# Patient Record
Sex: Female | Born: 1960 | Race: White | Hispanic: No | Marital: Married | Smoking: Former smoker
Health system: Southern US, Community
[De-identification: ages and names within clinical notes are randomized; demographics above are authoritative.]

## PROBLEM LIST (undated history)

## (undated) DIAGNOSIS — D649 Anemia, unspecified: Secondary | ICD-10-CM

## (undated) DIAGNOSIS — K760 Fatty (change of) liver, not elsewhere classified: Secondary | ICD-10-CM

## (undated) DIAGNOSIS — N390 Urinary tract infection, site not specified: Secondary | ICD-10-CM

## (undated) DIAGNOSIS — R011 Cardiac murmur, unspecified: Secondary | ICD-10-CM

## (undated) DIAGNOSIS — I2510 Atherosclerotic heart disease of native coronary artery without angina pectoris: Secondary | ICD-10-CM

## (undated) DIAGNOSIS — F329 Major depressive disorder, single episode, unspecified: Secondary | ICD-10-CM

## (undated) DIAGNOSIS — F419 Anxiety disorder, unspecified: Secondary | ICD-10-CM

## (undated) DIAGNOSIS — Q211 Atrial septal defect: Secondary | ICD-10-CM

## (undated) DIAGNOSIS — R110 Nausea: Secondary | ICD-10-CM

## (undated) DIAGNOSIS — G43909 Migraine, unspecified, not intractable, without status migrainosus: Secondary | ICD-10-CM

## (undated) DIAGNOSIS — I209 Angina pectoris, unspecified: Secondary | ICD-10-CM

## (undated) DIAGNOSIS — T148XXA Other injury of unspecified body region, initial encounter: Secondary | ICD-10-CM

## (undated) DIAGNOSIS — M542 Cervicalgia: Secondary | ICD-10-CM

## (undated) DIAGNOSIS — R0600 Dyspnea, unspecified: Secondary | ICD-10-CM

## (undated) DIAGNOSIS — R109 Unspecified abdominal pain: Secondary | ICD-10-CM

## (undated) DIAGNOSIS — Z9889 Other specified postprocedural states: Secondary | ICD-10-CM

## (undated) DIAGNOSIS — G8929 Other chronic pain: Secondary | ICD-10-CM

## (undated) DIAGNOSIS — T8859XA Other complications of anesthesia, initial encounter: Secondary | ICD-10-CM

## (undated) DIAGNOSIS — I639 Cerebral infarction, unspecified: Secondary | ICD-10-CM

## (undated) DIAGNOSIS — F32A Depression, unspecified: Secondary | ICD-10-CM

## (undated) DIAGNOSIS — N83209 Unspecified ovarian cyst, unspecified side: Secondary | ICD-10-CM

## (undated) DIAGNOSIS — J189 Pneumonia, unspecified organism: Secondary | ICD-10-CM

## (undated) DIAGNOSIS — R52 Pain, unspecified: Secondary | ICD-10-CM

## (undated) DIAGNOSIS — Z87442 Personal history of urinary calculi: Secondary | ICD-10-CM

## (undated) DIAGNOSIS — K5900 Constipation, unspecified: Secondary | ICD-10-CM

## (undated) DIAGNOSIS — I10 Essential (primary) hypertension: Secondary | ICD-10-CM

## (undated) DIAGNOSIS — G629 Polyneuropathy, unspecified: Secondary | ICD-10-CM

## (undated) DIAGNOSIS — E785 Hyperlipidemia, unspecified: Secondary | ICD-10-CM

## (undated) DIAGNOSIS — I4510 Unspecified right bundle-branch block: Secondary | ICD-10-CM

## (undated) HISTORY — PX: WISDOM TOOTH EXTRACTION: SHX21

## (undated) HISTORY — PX: NECK SURGERY: SHX720

## (undated) HISTORY — DX: Hyperlipidemia, unspecified: E78.5

---

## 1987-06-01 DIAGNOSIS — Q211 Atrial septal defect, unspecified: Secondary | ICD-10-CM

## 1987-06-01 HISTORY — DX: Atrial septal defect, unspecified: Q21.10

## 1987-06-01 HISTORY — DX: Atrial septal defect: Q21.1

## 1987-06-01 HISTORY — PX: ASD REPAIR: SHX258

## 1991-06-01 HISTORY — PX: ABDOMINAL HYSTERECTOMY: SHX81

## 1997-05-31 HISTORY — PX: CRANIECTOMY SUBOCCIPITAL FOR EXPLORATION / DECOMPRESSION CRANIAL NERVES: SUR326

## 1998-01-24 ENCOUNTER — Inpatient Hospital Stay (HOSPITAL_COMMUNITY): Admission: AD | Admit: 1998-01-24 | Discharge: 1998-02-09 | Payer: Self-pay | Admitting: *Deleted

## 2001-09-06 ENCOUNTER — Encounter: Admission: RE | Admit: 2001-09-06 | Discharge: 2001-09-06 | Payer: Self-pay | Admitting: Infectious Diseases

## 2001-09-24 ENCOUNTER — Emergency Department (HOSPITAL_COMMUNITY): Admission: EM | Admit: 2001-09-24 | Discharge: 2001-09-24 | Payer: Self-pay | Admitting: Emergency Medicine

## 2001-10-06 ENCOUNTER — Emergency Department (HOSPITAL_COMMUNITY): Admission: EM | Admit: 2001-10-06 | Discharge: 2001-10-06 | Payer: Self-pay | Admitting: Emergency Medicine

## 2001-10-13 ENCOUNTER — Other Ambulatory Visit: Admission: RE | Admit: 2001-10-13 | Discharge: 2001-10-13 | Payer: Self-pay | Admitting: General Surgery

## 2002-03-25 ENCOUNTER — Emergency Department (HOSPITAL_COMMUNITY): Admission: EM | Admit: 2002-03-25 | Discharge: 2002-03-25 | Payer: Self-pay | Admitting: Emergency Medicine

## 2002-08-12 ENCOUNTER — Emergency Department (HOSPITAL_COMMUNITY): Admission: EM | Admit: 2002-08-12 | Discharge: 2002-08-12 | Payer: Self-pay | Admitting: Emergency Medicine

## 2002-11-21 DIAGNOSIS — M542 Cervicalgia: Secondary | ICD-10-CM | POA: Insufficient documentation

## 2003-01-27 ENCOUNTER — Emergency Department (HOSPITAL_COMMUNITY): Admission: EM | Admit: 2003-01-27 | Discharge: 2003-01-27 | Payer: Self-pay | Admitting: Emergency Medicine

## 2003-06-01 HISTORY — PX: LEG SURGERY: SHX1003

## 2003-12-07 ENCOUNTER — Emergency Department (HOSPITAL_COMMUNITY): Admission: EM | Admit: 2003-12-07 | Discharge: 2003-12-07 | Payer: Self-pay | Admitting: Emergency Medicine

## 2005-04-23 ENCOUNTER — Ambulatory Visit (HOSPITAL_COMMUNITY): Admission: RE | Admit: 2005-04-23 | Discharge: 2005-04-23 | Payer: Self-pay | Admitting: Family Medicine

## 2005-05-30 ENCOUNTER — Emergency Department (HOSPITAL_COMMUNITY): Admission: EM | Admit: 2005-05-30 | Discharge: 2005-05-30 | Payer: Self-pay | Admitting: Emergency Medicine

## 2005-09-02 ENCOUNTER — Encounter: Admission: RE | Admit: 2005-09-02 | Discharge: 2005-09-02 | Payer: Self-pay | Admitting: Neurological Surgery

## 2005-09-17 ENCOUNTER — Emergency Department (HOSPITAL_COMMUNITY): Admission: EM | Admit: 2005-09-17 | Discharge: 2005-09-17 | Payer: Self-pay | Admitting: Emergency Medicine

## 2005-11-09 ENCOUNTER — Ambulatory Visit (HOSPITAL_COMMUNITY): Admission: RE | Admit: 2005-11-09 | Discharge: 2005-11-09 | Payer: Self-pay | Admitting: Family Medicine

## 2006-03-22 ENCOUNTER — Ambulatory Visit (HOSPITAL_COMMUNITY): Admission: RE | Admit: 2006-03-22 | Discharge: 2006-03-22 | Payer: Self-pay | Admitting: Family Medicine

## 2007-10-11 ENCOUNTER — Ambulatory Visit (HOSPITAL_COMMUNITY): Admission: RE | Admit: 2007-10-11 | Discharge: 2007-10-11 | Payer: Self-pay | Admitting: Internal Medicine

## 2008-01-10 ENCOUNTER — Ambulatory Visit (HOSPITAL_COMMUNITY): Admission: RE | Admit: 2008-01-10 | Discharge: 2008-01-10 | Payer: Self-pay | Admitting: Family Medicine

## 2008-01-16 ENCOUNTER — Ambulatory Visit (HOSPITAL_COMMUNITY): Admission: RE | Admit: 2008-01-16 | Discharge: 2008-01-16 | Payer: Self-pay | Admitting: Family Medicine

## 2008-05-30 ENCOUNTER — Emergency Department (HOSPITAL_COMMUNITY): Admission: EM | Admit: 2008-05-30 | Discharge: 2008-05-30 | Payer: Self-pay | Admitting: Emergency Medicine

## 2008-06-02 ENCOUNTER — Emergency Department (HOSPITAL_COMMUNITY): Admission: EM | Admit: 2008-06-02 | Discharge: 2008-06-02 | Payer: Self-pay | Admitting: Emergency Medicine

## 2008-06-06 ENCOUNTER — Encounter (HOSPITAL_COMMUNITY): Admission: RE | Admit: 2008-06-06 | Discharge: 2008-07-06 | Payer: Self-pay | Admitting: Emergency Medicine

## 2008-06-06 ENCOUNTER — Ambulatory Visit (HOSPITAL_COMMUNITY): Payer: Self-pay | Admitting: Emergency Medicine

## 2009-02-27 ENCOUNTER — Observation Stay (HOSPITAL_COMMUNITY): Admission: EM | Admit: 2009-02-27 | Discharge: 2009-02-28 | Payer: Self-pay | Admitting: Emergency Medicine

## 2010-06-21 ENCOUNTER — Encounter: Payer: Self-pay | Admitting: Family Medicine

## 2010-09-03 LAB — LIPID PANEL
Cholesterol: 98 mg/dL (ref 0–200)
HDL: 33 mg/dL — ABNORMAL LOW (ref >39)
LDL Cholesterol: 51 mg/dL (ref 0–99)

## 2010-09-03 LAB — COMPREHENSIVE METABOLIC PANEL
AST: 17 U/L (ref 0–37)
Albumin: 3.6 g/dL (ref 3.5–5.2)
Alkaline Phosphatase: 59 U/L (ref 39–117)
Calcium: 8.7 mg/dL (ref 8.4–10.5)
Chloride: 106 mEq/L (ref 96–112)
Creatinine, Ser: 0.56 mg/dL (ref 0.4–1.2)
GFR calc non Af Amer: >60 mL/min (ref >60)
Glucose, Bld: 55 mg/dL — ABNORMAL LOW (ref 70–99)
Sodium: 139 mEq/L (ref 135–145)

## 2010-09-03 LAB — CARDIAC PANEL(CRET KIN+CKTOT+MB+TROPI): Total CK: 36 U/L (ref 7–177)

## 2010-09-03 LAB — DIFFERENTIAL
Basophils Absolute: 0 10*3/uL (ref 0.0–0.1)
Basophils Relative: 1 % (ref 0–1)
Eosinophils Relative: 2 % (ref 0–5)
Lymphocytes Relative: 35 % (ref 12–46)
Neutro Abs: 3.2 10*3/uL (ref 1.7–7.7)
Neutrophils Relative %: 52 % (ref 43–77)

## 2010-09-03 LAB — GLUCOSE, CAPILLARY
Glucose-Capillary: 46 mg/dL — ABNORMAL LOW (ref 70–99)
Glucose-Capillary: 78 mg/dL (ref 70–99)
Glucose-Capillary: 83 mg/dL (ref 70–99)

## 2010-09-03 LAB — CBC
HCT: 42.2 % (ref 36.0–46.0)
MCHC: 34.3 g/dL (ref 30.0–36.0)

## 2010-09-04 LAB — BASIC METABOLIC PANEL
BUN: 7 mg/dL (ref 6–23)
CO2: 31 mEq/L (ref 19–32)
GFR calc non Af Amer: >60 mL/min (ref >60)
Potassium: 3.7 mEq/L (ref 3.5–5.1)

## 2010-09-04 LAB — POCT CARDIAC MARKERS
CKMB, poc: 1.1 ng/mL (ref 1.0–8.0)
Myoglobin, poc: 22.4 ng/mL (ref 12–200)
Myoglobin, poc: 32.5 ng/mL (ref 12–200)
Troponin i, poc: <0.05 ng/mL (ref 0.00–0.09)

## 2010-09-04 LAB — DIFFERENTIAL
Eosinophils Absolute: 0.1 10*3/uL (ref 0.0–0.7)
Lymphocytes Relative: 25 % (ref 12–46)
Monocytes Relative: 6 % (ref 3–12)
Neutro Abs: 4.5 10*3/uL (ref 1.7–7.7)

## 2010-09-04 LAB — CBC
HCT: 51 % — ABNORMAL HIGH (ref 36.0–46.0)
Hemoglobin: 17.5 g/dL — ABNORMAL HIGH (ref 12.0–15.0)
MCV: 82.6 fL (ref 78.0–100.0)

## 2011-02-12 ENCOUNTER — Emergency Department (HOSPITAL_COMMUNITY): Payer: Medicare Other

## 2011-02-12 ENCOUNTER — Emergency Department (HOSPITAL_COMMUNITY)
Admission: EM | Admit: 2011-02-12 | Discharge: 2011-02-12 | Discharge status: Against Medical Advice | Payer: Medicare Other | Attending: Emergency Medicine | Admitting: Emergency Medicine

## 2011-02-12 DIAGNOSIS — Z79899 Other long term (current) drug therapy: Secondary | ICD-10-CM | POA: Insufficient documentation

## 2011-02-12 DIAGNOSIS — G8929 Other chronic pain: Secondary | ICD-10-CM | POA: Insufficient documentation

## 2011-02-12 DIAGNOSIS — F329 Major depressive disorder, single episode, unspecified: Secondary | ICD-10-CM | POA: Insufficient documentation

## 2011-02-12 DIAGNOSIS — I10 Essential (primary) hypertension: Secondary | ICD-10-CM | POA: Insufficient documentation

## 2011-02-12 DIAGNOSIS — R0989 Other specified symptoms and signs involving the circulatory and respiratory systems: Secondary | ICD-10-CM | POA: Insufficient documentation

## 2011-02-12 DIAGNOSIS — R1013 Epigastric pain: Secondary | ICD-10-CM | POA: Insufficient documentation

## 2011-02-12 DIAGNOSIS — F3289 Other specified depressive episodes: Secondary | ICD-10-CM | POA: Insufficient documentation

## 2011-02-12 DIAGNOSIS — E119 Type 2 diabetes mellitus without complications: Secondary | ICD-10-CM | POA: Insufficient documentation

## 2011-02-12 DIAGNOSIS — R0609 Other forms of dyspnea: Secondary | ICD-10-CM | POA: Insufficient documentation

## 2011-02-12 DIAGNOSIS — R51 Headache: Secondary | ICD-10-CM | POA: Insufficient documentation

## 2011-02-12 DIAGNOSIS — R0602 Shortness of breath: Secondary | ICD-10-CM | POA: Insufficient documentation

## 2011-02-12 DIAGNOSIS — I2510 Atherosclerotic heart disease of native coronary artery without angina pectoris: Secondary | ICD-10-CM | POA: Insufficient documentation

## 2011-02-12 DIAGNOSIS — R110 Nausea: Secondary | ICD-10-CM | POA: Insufficient documentation

## 2011-02-12 DIAGNOSIS — R079 Chest pain, unspecified: Secondary | ICD-10-CM | POA: Insufficient documentation

## 2011-02-12 LAB — CBC
Hemoglobin: 14 g/dL (ref 12.0–15.0)
MCH: 29.1 pg (ref 26.0–34.0)
MCHC: 34.7 g/dL (ref 30.0–36.0)
Platelets: 204 10*3/uL (ref 150–400)
RBC: 4.81 MIL/uL (ref 3.87–5.11)
RDW: 12.7 % (ref 11.5–15.5)
WBC: 6.7 10*3/uL (ref 4.0–10.5)

## 2011-02-12 LAB — COMPREHENSIVE METABOLIC PANEL
Alkaline Phosphatase: 86 U/L (ref 39–117)
Calcium: 9.3 mg/dL (ref 8.4–10.5)
Creatinine, Ser: 0.68 mg/dL (ref 0.50–1.10)
GFR calc non Af Amer: >60 mL/min (ref >60)
Glucose, Bld: 156 mg/dL — ABNORMAL HIGH (ref 70–99)
Sodium: 137 mEq/L (ref 135–145)

## 2011-02-12 LAB — POCT I-STAT TROPONIN I

## 2011-02-13 ENCOUNTER — Inpatient Hospital Stay (HOSPITAL_COMMUNITY)
Admission: EM | Admit: 2011-02-13 | Discharge: 2011-02-15 | DRG: 287 | Disposition: A | Discharge status: Home / Self Care | Payer: Medicare Other | Attending: Cardiovascular Disease | Admitting: Cardiovascular Disease

## 2011-02-13 DIAGNOSIS — Z880 Allergy status to penicillin: Secondary | ICD-10-CM

## 2011-02-13 DIAGNOSIS — I10 Essential (primary) hypertension: Secondary | ICD-10-CM | POA: Diagnosis present

## 2011-02-13 DIAGNOSIS — Z7982 Long term (current) use of aspirin: Secondary | ICD-10-CM

## 2011-02-13 DIAGNOSIS — F17200 Nicotine dependence, unspecified, uncomplicated: Secondary | ICD-10-CM | POA: Diagnosis present

## 2011-02-13 DIAGNOSIS — F341 Dysthymic disorder: Secondary | ICD-10-CM | POA: Diagnosis present

## 2011-02-13 DIAGNOSIS — R0789 Other chest pain: Principal | ICD-10-CM | POA: Diagnosis present

## 2011-02-13 DIAGNOSIS — G8929 Other chronic pain: Secondary | ICD-10-CM | POA: Diagnosis present

## 2011-02-13 DIAGNOSIS — Z833 Family history of diabetes mellitus: Secondary | ICD-10-CM

## 2011-02-13 DIAGNOSIS — I498 Other specified cardiac arrhythmias: Secondary | ICD-10-CM | POA: Diagnosis present

## 2011-02-13 DIAGNOSIS — Z8614 Personal history of Methicillin resistant Staphylococcus aureus infection: Secondary | ICD-10-CM

## 2011-02-13 DIAGNOSIS — G609 Hereditary and idiopathic neuropathy, unspecified: Secondary | ICD-10-CM | POA: Diagnosis present

## 2011-02-13 DIAGNOSIS — I4510 Unspecified right bundle-branch block: Secondary | ICD-10-CM | POA: Diagnosis present

## 2011-02-13 DIAGNOSIS — G43909 Migraine, unspecified, not intractable, without status migrainosus: Secondary | ICD-10-CM | POA: Diagnosis present

## 2011-02-13 DIAGNOSIS — I2510 Atherosclerotic heart disease of native coronary artery without angina pectoris: Secondary | ICD-10-CM | POA: Diagnosis present

## 2011-02-13 DIAGNOSIS — Z79899 Other long term (current) drug therapy: Secondary | ICD-10-CM

## 2011-02-13 DIAGNOSIS — E119 Type 2 diabetes mellitus without complications: Secondary | ICD-10-CM | POA: Diagnosis present

## 2011-02-13 LAB — DIFFERENTIAL
Basophils Relative: 0 % (ref 0–1)
Eosinophils Absolute: 0.1 10*3/uL (ref 0.0–0.7)
Eosinophils Relative: 1 % (ref 0–5)
Lymphs Abs: 1.7 10*3/uL (ref 0.7–4.0)
Monocytes Absolute: 0.4 10*3/uL (ref 0.1–1.0)
Monocytes Relative: 7 % (ref 3–12)
Neutrophils Relative %: 67 % (ref 43–77)

## 2011-02-13 LAB — URINALYSIS, ROUTINE W REFLEX MICROSCOPIC
Glucose, UA: 250 mg/dL — AB
Ketones, ur: NEGATIVE mg/dL
Leukocytes, UA: NEGATIVE
Nitrite: NEGATIVE
pH: 6 (ref 5.0–8.0)

## 2011-02-13 LAB — COMPREHENSIVE METABOLIC PANEL
Albumin: 4.3 g/dL (ref 3.5–5.2)
Alkaline Phosphatase: 100 U/L (ref 39–117)
BUN: 14 mg/dL (ref 6–23)
Calcium: 10 mg/dL (ref 8.4–10.5)
Creatinine, Ser: 0.66 mg/dL (ref 0.50–1.10)
GFR calc Af Amer: >60 mL/min (ref >60)
Glucose, Bld: 173 mg/dL — ABNORMAL HIGH (ref 70–99)
Total Protein: 8.3 g/dL (ref 6.0–8.3)

## 2011-02-13 LAB — CBC
MCH: 30.1 pg (ref 26.0–34.0)
MCHC: 36 g/dL (ref 30.0–36.0)
MCV: 83.5 fL (ref 78.0–100.0)
Platelets: 244 10*3/uL (ref 150–400)
RBC: 5.52 MIL/uL — ABNORMAL HIGH (ref 3.87–5.11)
RDW: 12.6 % (ref 11.5–15.5)

## 2011-02-13 LAB — D-DIMER, QUANTITATIVE: D-Dimer, Quant: 0.31 ug/mL-FEU (ref 0.00–0.48)

## 2011-02-13 LAB — CK TOTAL AND CKMB (NOT AT ARMC)
CK, MB: 2.2 ng/mL (ref 0.3–4.0)
Total CK: 53 U/L (ref 7–177)

## 2011-02-13 LAB — PRO B NATRIURETIC PEPTIDE: Pro B Natriuretic peptide (BNP): 276.7 pg/mL — ABNORMAL HIGH (ref 0–125)

## 2011-02-13 LAB — POCT I-STAT TROPONIN I: Troponin i, poc: 0.01 ng/mL (ref 0.00–0.08)

## 2011-02-14 HISTORY — PX: CARDIAC CATHETERIZATION: SHX172

## 2011-02-14 LAB — BASIC METABOLIC PANEL
BUN: 15 mg/dL (ref 6–23)
CO2: 30 mEq/L (ref 19–32)
Glucose, Bld: 267 mg/dL — ABNORMAL HIGH (ref 70–99)
Potassium: 4.3 mEq/L (ref 3.5–5.1)
Sodium: 137 mEq/L (ref 135–145)

## 2011-02-14 LAB — GLUCOSE, CAPILLARY
Glucose-Capillary: 255 mg/dL — ABNORMAL HIGH (ref 70–99)
Glucose-Capillary: 181 mg/dL — ABNORMAL HIGH (ref 70–99)

## 2011-02-14 LAB — CBC
HCT: 43.5 % (ref 36.0–46.0)
Hemoglobin: 15.1 g/dL — ABNORMAL HIGH (ref 12.0–15.0)
RBC: 5.18 MIL/uL — ABNORMAL HIGH (ref 3.87–5.11)

## 2011-02-14 LAB — LIPID PANEL
HDL: 42 mg/dL (ref >39)
LDL Cholesterol: 47 mg/dL (ref 0–99)
Total CHOL/HDL Ratio: 2.8 RATIO
Triglycerides: 142 mg/dL (ref <150)
VLDL: 28 mg/dL (ref 0–40)

## 2011-02-14 LAB — HEMOGLOBIN A1C
Hgb A1c MFr Bld: 8.1 % — ABNORMAL HIGH (ref <5.7)
Mean Plasma Glucose: 186 mg/dL — ABNORMAL HIGH (ref <117)

## 2011-02-14 LAB — CARDIAC PANEL(CRET KIN+CKTOT+MB+TROPI)
Relative Index: INVALID (ref 0.0–2.5)
Total CK: 52 U/L (ref 7–177)

## 2011-02-15 HISTORY — PX: CARDIAC CATHETERIZATION: SHX172

## 2011-02-15 LAB — CBC
HCT: 41.1 % (ref 36.0–46.0)
Hemoglobin: 14.1 g/dL (ref 12.0–15.0)
MCV: 83.9 fL (ref 78.0–100.0)
Platelets: 266 10*3/uL (ref 150–400)
RBC: 4.9 MIL/uL (ref 3.87–5.11)
WBC: 7.8 10*3/uL (ref 4.0–10.5)

## 2011-02-15 LAB — BASIC METABOLIC PANEL
Chloride: 100 mEq/L (ref 96–112)
GFR calc non Af Amer: >60 mL/min (ref >60)
Glucose, Bld: 205 mg/dL — ABNORMAL HIGH (ref 70–99)
Potassium: 3.8 mEq/L (ref 3.5–5.1)
Sodium: 136 mEq/L (ref 135–145)

## 2011-02-15 LAB — GLUCOSE, CAPILLARY
Glucose-Capillary: 166 mg/dL — ABNORMAL HIGH (ref 70–99)
Glucose-Capillary: 190 mg/dL — ABNORMAL HIGH (ref 70–99)
Glucose-Capillary: 260 mg/dL — ABNORMAL HIGH (ref 70–99)

## 2011-02-17 NOTE — Discharge Summary (Signed)
Anita [name], Anita [name] NO.:  [id number]  MEDICAL RECORD NO.:  [id number]  LOCATION:  2020                         FACILITY:  [facility]  PHYSICIAN:  Nicki Guadalajara, M.D.     DATE OF BIRTH:  [birth year]  DATE OF ADMISSION:  02/13/2011 DATE OF DISCHARGE:  02/15/2011                              DISCHARGE SUMMARY   DISCHARGE DIAGNOSES: 1. Chest pain, negative myocardial infarction.     a.     Cardiac catheterization with nonobstructive coronary disease      with 30-40% left anterior descending stenosis and 20-30% diagonal,      otherwise patent coronary arteries.  EF 55%.     b.     Sinus tachycardia, episodically.     c.     Atrial septal defect repair/closure. 2. Diabetes mellitus type 2, non-insulin dependent. 3. Accelerated hypertension, poorly controlled. 4. Tobacco abuse. 5. Chronic pain in neck, back, and head. 6. History of migraine. 7. Peripheral neuropathy. 8. Incomplete right bundle-branch block.  DISCHARGE CONDITION:  Improved.  PROCEDURES:  Combined left heart cath on February 15, 2011, by Dr. Nicki Guadalajara.  DISCHARGE MEDICATIONS:  See medication reconciliation sheet, multiple meds were added including Crestor, Protonix, metoprolol 75 mg twice a day and we also added Imdur 30 mg daily.  DISCHARGE INSTRUCTIONS: 1. Follow with Dr. Tresa Endo in 2 weeks the office will call with date and     time. 2. She lightheadedness or dizziness.  He 3. Increase activity slowly.  May shower.  No lifting for 2 days.  No     driving for 2 days. 4. Stop smoking. 5. Heart-healthy diabetic diet. 6. Wash cath site with soap and water.  Call if any bleeding,     swelling, or drainage.  HISTORY OF PRESENT ILLNESS:  A 50 year old female with a history of ASD repair slash closure 23 years ago, history of type 2 diabetes mellitus non-insulin dependent, hypertension as well as chronic neck and back pain presented to the emergency room February 13, 2011, with complaints of  chest discomfort.  She was seen by primary physician on the day before and he felt she should not go to the ER to be seen.  She went to [facility] ER but ER physician wanted to evaluate her first and because Dr. Tresa Endo was not called right away she signed out AMA, but then returned on February 13, 2011, with complaints of chest pain.  She states on the EKG initially she had an irregular heartbeat.  The pain began over approximately 3 days prior to admission under the left breast radiates into her back where she complains of pain.  She also has a burning sensation substernally up to her throat which she tells me mimics her sensation of indigestion.  This was described as a sharp chest pain into her left breast, sublingual nitro completely relieved her pain.  EKG revealed sinus tachycardia with incomplete right bundle-branch block. Cardiac enzymes have been negative throughout hospitalization.  Blood pressure was also elevated on admission 175/85.  She was admitted to [facility] for further evaluation to rule out MI.  She was given IV Lopressor and started on p.o. Lopressor for  accelerated hypertension.  She was also treated with IV hydralazine.  The patient was ruled out for an MI and plans are for cardiac catheterization on February 15, 2011 which she underwent with minimal coronary disease and normal EF.  Dr. Tresa Endo wants better control of her blood pressure.  So medications have been adjusted and as well has followup in the office.  LABORATORY DATA:  Hemoglobin prior to discharge 14.1, hematocrit 41.1, WBC 7.8, platelets 266.  Pro-time 12.9, INR 0.95, D-dimer was 0.31 negative.  Chemistry sodium 136, potassium 3.8, chloride 100, CO2 of 29, glucose 205, BUN 18, creatinine 0.70.  LFTs were normal.  Calcium 9.2, magnesium was 1.7.  Hemoglobin A1c was 8.1.  Cardiac enzymes were negative.  CK ranging 52 to 53, MB 2.7 to 2.2. Troponin I less than 0.30 and BNP was 276.  Total cholesterol 117,  triglycerides 142, HDL 42, LDL 47.  TSH was 2.447.  UA was clear.  A 2-D echo EF was 55%, moderate LVH, grade 1 diastolic dysfunction, mitral regurg, mild.  Also her atrial septum was poorly visualized.  No abnormality by color flow in this patient 24-year status post ASD repair.  RADIOLOGY:  Chest x-ray.  No active cardiopulmonary disease, median sternotomy, no effusion.  EKG initially sinus tachycardia, rate of 99 to120 with incomplete right bundle-branch block, and otherwise no acute changes.  With the beta-blocker, the patient's heart rate has been improved control, blood pressure though still somewhat elevated has improved from admission.  Blood pressure at discharge range 142/78 to 164/86, pulse 92, resp is 18, temperature 97.5, oxygen saturation on room air 99%. The patient is ambulating without problems.  Postcath after her cath orders were completed.  Dr. Tresa Endo felt she was stable for discharge and will follow up as an outpatient.     Darcella Gasman. Annie Paras, N.P.   ______________________________ Nicki Guadalajara, M.D.    LRI/MEDQ  D:  02/15/2011  T:  02/15/2011  Job:  [id number]  Electronically Signed by Nada Boozer N.P. on 02/16/2011 11:19:46 AM Electronically Signed by Nicki Guadalajara M.D. on 02/17/2011 04:56:27 PM

## 2011-02-17 NOTE — Cardiovascular Report (Signed)
NAMEGARNELL, BEGEMAN NO.:  [id number]  MEDICAL RECORD NO.:  [id number]  LOCATION:  2020                         FACILITY:  [facility]  PHYSICIAN:  Nicki Guadalajara, M.D.     DATE OF BIRTH:  July 08, 1960  DATE OF PROCEDURE:  02/15/2011 DATE OF DISCHARGE:                           CARDIAC CATHETERIZATION   PROCEDURE:  Cardiac catheterization.  OPERATOR:  Nicki Guadalajara, MD  INDICATIONS:  Anita [name] is a 50 year old female who has a history of open atrial septal defect repair in November 1988.  She has a history of migraines, status post remote traumatic head injury with chronic headaches and neuropathy.  She has diabetes mellitus and chronic neck and back discomfort.  In addition, there is a history of tobacco use. She recently developed recurrent episodes of chest pain which ultimately led to her admission to [facility].  She was seen with worrisome symptomatology, admitted by Dr. Allyson Sabal, and now scheduled for definitive diagnostic cardiac catheterization.  PROCEDURE IN DETAIL:  After premedication with Versed 2 mg plus fentanyl 50 mcg, the patient was prepped and draped in the usual fashion.  The right femoral artery was punctured anteriorly and a 5-French sheath was inserted without difficulty.  Diagnostic cardiac catheterization was done utilizing 5-French Judkins left and right coronary catheters.  A 5- French pigtail catheter was used for biplane cine left ventriculography as well as distal aortography.  Hemostasis was obtained by direct manual pressure.  The patient tolerated the procedure well.  HEMODYNAMIC DATA:  Central aortic pressure 190/80.  Left ventricular pressure 190/12, post A-wave 24.  ANGIOGRAPHIC DATA:  Left main coronary artery was angiographically normal and bifurcated into an LAD and left circumflex system.  The LAD gave rise to a proximal bifurcating diagonal vessel which had 30% ostial narrowing followed by 20% irregularity.  Just  after this diagonal and first septal perforating artery, the LAD was narrowed to 30- 40%.  The vessel otherwise is free of significant disease and extended to the apex and gave rise to a very large more distal septal perforating branch.  The circumflex vessel was angiographically normal and gave rise to a small high marginal ramus intermediate like vessel, and two additional marginal vessels.  The right coronary artery was angiographically normal and gave rise to moderate-sized PDA system and smaller posterolateral vessel.  Biplane cine left ventriculography revealed preserved global contractility with an ejection fraction of 55%.  There was evidence for concentric left ventricular hypertrophy.  There were no segmental wall motion abnormalities.  Distal aortography was done due to the patient's significant recent hypertension.  There was no evidence for renal artery stenosis.  There was no significant aortoiliac disease.  IMPRESSION: 1. Mild concentric left ventricular hypertrophy with normal left     ventricular systolic function. 2. Mild coronary obstructive disease involving the left anterior     descending, diagonal system with 30% ostial narrowing in the first     diagonal vessel followed by 20% irregularity and 30-40% proximal     left anterior descending stenosis after this diagonal takeoff.  RECOMMENDATIONS:  Medical therapy.  The patient's medications will be adjusted.  She will require more optimal blood  pressure control. Smoking cessation is imperative.  Aggressive lipid management will be undertaken.          ______________________________ Nicki Guadalajara, M.D.     TK/MEDQ  D:  02/15/2011  T:  02/15/2011  Job:  [id number]  Electronically Signed by Nicki Guadalajara M.D. on 02/17/2011 04:56:22 PM

## 2011-02-28 NOTE — H&P (Signed)
[name], [name] NO.:  [id number]  MEDICAL RECORD NO.:  [id number]  LOCATION:  [facility]                         FACILITY:  [facility]  PHYSICIAN:  Landry Corporal, MD DATE OF BIRTH:  1961/03/29  DATE OF ADMISSION:  02/13/2011 DATE OF DISCHARGE:                             HISTORY & PHYSICAL   CHIEF COMPLAINT:  Chest pain.  HISTORY OF PRESENT ILLNESS:  [name] is a 50 year old female, who has a history of ASD closure/repair 23 years ago, type 2 diabetes mellitus, and hypertension as well as chronic neck and back pain, who presents to the emergency department with complaints of chest discomfort.  She was seen by her primary care physician yesterday and was told, based on her EKG that she needed to be seen and evaluated here at [facility].  She tells me that her heart rate was irregular at that time.  She presented to [facility] for evaluation and insisted that Dr. Tresa Endo be called.  The ER physician wanted to proceed with his initial evaluation, and because Dr. Tresa Endo was not called right away, she signed out AMA.  She returns this evening with complaints of chest discomfort.  She complains of pain which started about 3 days ago.  There is an area just under her left breast which radiates into her back where she complains of pain.  When that discomfort starts, she also begins to experience a burning sensation substernally up to her throat which she tells me mimics her sensation of indigestion.  She says at times the pain under her left breast is very sharp.  She has been treated with sublingual nitroglycerin with complete relief on arrival to the emergency department.  Her EKG reveals sinus tachycardia with an incomplete right bundle-branch block.  Her initial point-of-care troponin was negative. On arrival, her blood pressure was 175/87.  She has been treated with aspirin as well as morphine.  Her heart rate remains elevated around 100 and her blood pressure is  elevated as well at 185/90. I have also ordered IV Lopressor for her at this time as well.  She has a negative D- dimer.  At this time, she denies any orthopnea or PND.  No lower extremity edema.  She denies any sensation of tachycardia or palpitations.  No lightheadedness, dizziness.  No syncope or presyncope. She denies any fever or chills.  No melena or hematochezia.  She does have chronic pain in her head, neck, and back and is on multiple pain medications for that; however, she did not bring a list of her medicines with her nor can she recall the names of her medicines.  Currently, she has a very minimal pain.  PAST MEDICAL HISTORY: 1. ASD repair/closure. 2. Diabetes mellitus type 2. 3. Chronic neck and back pain. 4. History of migraines. 5. History of head injury. 6. History of MRSA. 7. History of neuropathy. 8. Hypertension. 9. Unknown lipid status. 10.Current tobacco use. 11.Chronic nausea.  Family history is positive for diabetes mellitus.  SOCIAL HISTORY:  She is married, has been married 1 year.  She does not work.  She is disabled.  She smokes 2-3 cigarettes a day.  Denies  any alcohol use.  No illicit drug use.  ALLERGIES:  PENICILLIN.  CURRENT MEDICATIONS:  The patient currently is unsure of her medications and dosages.  REVIEW OF SYSTEMS:  As per HPI, otherwise, negative.  PHYSICAL EXAMINATION:  VITAL SIGNS:  Blood pressure is 174/80, pulse is 92 and regular, respirations are 18, pulse ox is 97%. GENERAL:  This is a 50 year old white female, in no acute distress. HEENT:  Normocephalic, atraumatic.  Pupils are equal and reactive to light and accommodation. NECK:  Supple.  No JVD or carotid bruits. CARDIOVASCULAR:  Regular rate and rhythm.  S1 and S2 without appreciable murmur, gallop, or rub.  There is tenderness to palpation of the left chest just below her breast. LUNGS:  Clear to auscultation bilaterally with normal respiratory effort. ABDOMEN:  Soft,  nontender.  Bowel sounds are present x4.  No hepatosplenomegaly or masses. EXTREMITIES:  Radial, femoral, dorsal pedal arteries are present.  No lower extremity edema.  No clubbing, cyanosis, or ulcers. NEUROLOGIC:  Oriented to person, place, time.  Normal mood and affect. Motor strength is 5/5 bilaterally.  Sensation is intact. SKIN:  Warm and dry.  LABORATORY DATA:  D-dimer is 0.31.  Urinalysis is negative with the exception of glucose in the urine of 250.  Sodium is 135, potassium is 4.0, glucose is 173, BUN is 14, creatinine 0.66, troponin is 0.01.  CBC, hemoglobin is 16.6, hematocrit 46.1, white cell count is 6.7.  Chest x-ray is negative.  IMPRESSION: 1. Chest pain, likely musculoskeletal. 2. History of atrial septal defect closure. 3. Diabetes mellitus type 2. 4. Hypertension. 5. Unknown lipid status. 6. Tobacco abuse. 7. Chronic pain in the neck, back, and head. 8. History of migraine. 9. History of MRSA infection. 10.Peripheral neuropathy.  PLAN:  We will admit to telemetry.  We will rule her out for myocardial infarction.  We will continue cycling her enzymes.  We will go ahead and treat her now with IV Lopressor 5 mg and we will begin her on p.o. Lopressor.  We will continue with her ramipril and also I have ordered hydralazine IV for persistent blood pressure greater than 160 systolic. We will try to obtain the correct dosages and medications for her and order those accordingly.  We will place an inch of nitroglycerin paste q.6 h. and begin Lovenox as well as aspirin.  We will obtain an echocardiogram to assess for any structural abnormalities given she is having chest pain and has had a history of ASD closure.  Further recommendations pending the above studies.    ______________________________ [employer], NP   ______________________________ Landry Corporal, MD    LS/MEDQ  D:  02/13/2011  T:  02/13/2011  Job:  [id number]  cc:   Madelin Rear. Sherwood Gambler,  MD  Electronically Signed by Charmian Muff NP on 02/28/2011 01:01:23 AM Electronically Signed by Bryan Lemma MD on 02/28/2011 10:18:56 AM

## 2011-06-14 DIAGNOSIS — M5030 Other cervical disc degeneration, unspecified cervical region: Secondary | ICD-10-CM | POA: Diagnosis not present

## 2011-06-14 DIAGNOSIS — M531 Cervicobrachial syndrome: Secondary | ICD-10-CM | POA: Diagnosis not present

## 2011-06-14 DIAGNOSIS — G894 Chronic pain syndrome: Secondary | ICD-10-CM | POA: Diagnosis not present

## 2011-06-21 DIAGNOSIS — E119 Type 2 diabetes mellitus without complications: Secondary | ICD-10-CM | POA: Diagnosis not present

## 2011-06-21 DIAGNOSIS — R002 Palpitations: Secondary | ICD-10-CM | POA: Diagnosis not present

## 2011-06-21 DIAGNOSIS — K219 Gastro-esophageal reflux disease without esophagitis: Secondary | ICD-10-CM | POA: Diagnosis not present

## 2011-06-21 DIAGNOSIS — I119 Hypertensive heart disease without heart failure: Secondary | ICD-10-CM | POA: Diagnosis not present

## 2011-07-13 DIAGNOSIS — M531 Cervicobrachial syndrome: Secondary | ICD-10-CM | POA: Diagnosis not present

## 2011-07-13 DIAGNOSIS — G894 Chronic pain syndrome: Secondary | ICD-10-CM | POA: Diagnosis not present

## 2011-07-13 DIAGNOSIS — M5030 Other cervical disc degeneration, unspecified cervical region: Secondary | ICD-10-CM | POA: Diagnosis not present

## 2011-08-11 DIAGNOSIS — G894 Chronic pain syndrome: Secondary | ICD-10-CM | POA: Diagnosis not present

## 2011-08-11 DIAGNOSIS — M47812 Spondylosis without myelopathy or radiculopathy, cervical region: Secondary | ICD-10-CM | POA: Diagnosis not present

## 2011-08-11 DIAGNOSIS — M531 Cervicobrachial syndrome: Secondary | ICD-10-CM | POA: Diagnosis not present

## 2011-08-14 ENCOUNTER — Other Ambulatory Visit: Payer: Self-pay

## 2011-08-14 ENCOUNTER — Emergency Department (HOSPITAL_COMMUNITY): Payer: Medicare Other

## 2011-08-14 ENCOUNTER — Encounter (HOSPITAL_COMMUNITY): Payer: Self-pay | Admitting: *Deleted

## 2011-08-14 ENCOUNTER — Emergency Department (HOSPITAL_COMMUNITY)
Admission: EM | Admit: 2011-08-14 | Discharge: 2011-08-14 | Discharge status: Against Medical Advice | Payer: Medicare Other | Attending: Emergency Medicine | Admitting: Emergency Medicine

## 2011-08-14 DIAGNOSIS — R079 Chest pain, unspecified: Secondary | ICD-10-CM | POA: Diagnosis not present

## 2011-08-14 DIAGNOSIS — R109 Unspecified abdominal pain: Secondary | ICD-10-CM | POA: Diagnosis not present

## 2011-08-14 DIAGNOSIS — L02219 Cutaneous abscess of trunk, unspecified: Secondary | ICD-10-CM | POA: Insufficient documentation

## 2011-08-14 DIAGNOSIS — K5900 Constipation, unspecified: Secondary | ICD-10-CM | POA: Insufficient documentation

## 2011-08-14 DIAGNOSIS — L03319 Cellulitis of trunk, unspecified: Secondary | ICD-10-CM | POA: Diagnosis not present

## 2011-08-14 DIAGNOSIS — G8929 Other chronic pain: Secondary | ICD-10-CM | POA: Diagnosis not present

## 2011-08-14 DIAGNOSIS — I517 Cardiomegaly: Secondary | ICD-10-CM | POA: Diagnosis not present

## 2011-08-14 DIAGNOSIS — R112 Nausea with vomiting, unspecified: Secondary | ICD-10-CM | POA: Diagnosis not present

## 2011-08-14 DIAGNOSIS — L02211 Cutaneous abscess of abdominal wall: Secondary | ICD-10-CM

## 2011-08-14 HISTORY — DX: Nausea: R11.0

## 2011-08-14 HISTORY — DX: Essential (primary) hypertension: I10

## 2011-08-14 HISTORY — DX: Unspecified abdominal pain: R10.9

## 2011-08-14 HISTORY — DX: Polyneuropathy, unspecified: G62.9

## 2011-08-14 HISTORY — DX: Cervicalgia: M54.2

## 2011-08-14 HISTORY — DX: Pain, unspecified: R52

## 2011-08-14 HISTORY — DX: Migraine, unspecified, not intractable, without status migrainosus: G43.909

## 2011-08-14 HISTORY — DX: Other injury of unspecified body region, initial encounter: T14.8XXA

## 2011-08-14 HISTORY — DX: Other chronic pain: G89.29

## 2011-08-14 HISTORY — DX: Unspecified right bundle-branch block: I45.10

## 2011-08-14 HISTORY — DX: Other specified postprocedural states: Z98.890

## 2011-08-14 LAB — DIFFERENTIAL
Basophils Absolute: 0 10*3/uL (ref 0.0–0.1)
Basophils Relative: 0 % (ref 0–1)
Eosinophils Absolute: 0.1 10*3/uL (ref 0.0–0.7)
Eosinophils Relative: 2 % (ref 0–5)
Lymphocytes Relative: 27 % (ref 12–46)
Monocytes Absolute: 0.6 10*3/uL (ref 0.1–1.0)

## 2011-08-14 LAB — COMPREHENSIVE METABOLIC PANEL
ALT: 42 U/L — ABNORMAL HIGH (ref 0–35)
AST: 31 U/L (ref 0–37)
Albumin: 3.7 g/dL (ref 3.5–5.2)
Calcium: 9.4 mg/dL (ref 8.4–10.5)
Creatinine, Ser: 0.56 mg/dL (ref 0.50–1.10)
Sodium: 134 mEq/L — ABNORMAL LOW (ref 135–145)
Total Protein: 7.2 g/dL (ref 6.0–8.3)

## 2011-08-14 LAB — LACTIC ACID, PLASMA: Lactic Acid, Venous: 1.7 mmol/L (ref 0.5–2.2)

## 2011-08-14 LAB — URINALYSIS, ROUTINE W REFLEX MICROSCOPIC
Bilirubin Urine: NEGATIVE
Glucose, UA: >1000 mg/dL — AB
Protein, ur: NEGATIVE mg/dL
Specific Gravity, Urine: 1.02 (ref 1.005–1.030)
Urobilinogen, UA: 0.2 mg/dL (ref 0.0–1.0)

## 2011-08-14 LAB — CBC
MCH: 27.9 pg (ref 26.0–34.0)
MCHC: 33.8 g/dL (ref 30.0–36.0)
MCV: 82.6 fL (ref 78.0–100.0)
Platelets: 204 10*3/uL (ref 150–400)
RDW: 12.6 % (ref 11.5–15.5)
WBC: 6.6 10*3/uL (ref 4.0–10.5)

## 2011-08-14 LAB — TROPONIN I: Troponin I: <0.3 ng/mL (ref <0.30)

## 2011-08-14 LAB — PROCALCITONIN: Procalcitonin: <0.1 ng/mL

## 2011-08-14 LAB — URINE MICROSCOPIC-ADD ON

## 2011-08-14 MED ORDER — SODIUM CHLORIDE 0.9 % IV SOLN: INTRAVENOUS | Status: DC

## 2011-08-14 MED ORDER — HYDROMORPHONE HCL PF 1 MG/ML IJ SOLN
1.0000 mg | INTRAMUSCULAR | Status: DC | PRN
  Filled 2011-08-14: qty 1

## 2011-08-14 MED ORDER — ONDANSETRON 8 MG PO TBDP
ORAL_TABLET | ORAL | Status: AC
  Filled 2011-08-14: qty 1

## 2011-08-14 MED ORDER — ONDANSETRON HCL 4 MG/2ML IJ SOLN: 4.0000 mg | INTRAMUSCULAR | Status: DC | PRN

## 2011-08-14 MED ORDER — MINOCYCLINE HCL 100 MG PO CAPS: 100.0000 mg | ORAL_CAPSULE | Freq: Two times a day (BID) | ORAL | Status: AC

## 2011-08-14 NOTE — ED Notes (Signed)
Substernal CP x 1 week, described as pressure.

## 2011-08-14 NOTE — ED Notes (Signed)
Pt states, "Pain doctor said not to take morphine any more (don't do any good and makes me sick). But dilaudid works."

## 2011-08-14 NOTE — ED Provider Notes (Signed)
History     CSN: [id number]  Arrival date & time 08/14/11  6213   First MD Initiated Contact with Patient 08/14/11 1846      Chief Complaint  Patient presents with  . Chest Pain  . Abscess  . Pain  . Nausea    HPI Pt was seen at 1900.  Per pt, c/o gradual onset and persistence of constant multiple complaints for the past 1 week.  States her pain doctor has told her to come to the ED for these complaints and to "get some dilaudid because it's the only thing that will work."  Pt c/o constant mid-sternal chest "pain" for the past week, worse over the past 3 days.  Describes the pain as "burning" and "constant."  Also c/o multiple intermittent episodes of N/V for the past 3 days, which has "worsened" her chest pain.  Also c/o acute flair of her chronic neck and back pain.  Also c/o abscess to her right lower abd wall area that "my pain doctor said needs to be opened and cultured."  Denies any change in her usual longstanding chronic pain syndromes.  Denies diarrhea, no black or blood in stools or emesis, no SOB/cough, no palpitations, no radiation of her CP, no abd pain, no flank pain, no dysuria, no fevers.     Past Medical History  Diagnosis Date  . Nerve damage     to neck.  . Pain management   . Hypertension   . Diabetes mellitus   . Peripheral neuropathy   . Chronic back pain   . Chronic neck pain   . Migraine headache   . Chronic nausea   . Incomplete RBBB   . Chronic abdominal pain   . History of cardiac catheterization 02/15/11 Dr. 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 Leg surgery   . Asd repair   . Cardiac catheterization   . Neck surgery   . Abdominal hysterectomy     History  Substance  Use Topics  . Smoking status: Current Everyday Smoker  . Smokeless tobacco: Not on file  . Alcohol Use: No    Review of Systems ROS: Statement: All systems negative except as marked or noted in the HPI; Constitutional: Negative for fever and chills. ; ; Eyes: Negative for eye pain, redness and discharge. ; ; ENMT: Negative for ear pain, hoarseness, nasal congestion, sinus pressure and sore throat. ; ; Cardiovascular: +CP.  Negative for palpitations, diaphoresis, dyspnea and peripheral edema. ; ; Respiratory: Negative for cough, wheezing and stridor. ; ; Gastrointestinal: +N/V.  Negative for diarrhea, abdominal pain, blood in stool, hematemesis, jaundice and rectal bleeding. . ; ; Genitourinary: Negative for dysuria, flank pain and hematuria. ; ; Musculoskeletal: +back pain and neck pain. Negative for swelling and trauma.; ; Skin: Negative for pruritus, rash, abrasions, blisters, bruising and +skin lesion.; ; Neuro: Negative for headache, lightheadedness and neck stiffness. Negative for weakness, altered level of consciousness , altered mental status, extremity weakness, paresthesias, involuntary movement, seizure and syncope.      Allergies  Morphine and related; Other; and Penicillins  Home Medications  No current outpatient prescriptions on file.  BP 177/64  Pulse 80  Temp(Src) 98.7 F (37.1 C) (Oral)  Resp 16  Ht 5\' 5"  (  1.651 m)  Wt 165 lb (74.844 kg)  BMI 27.46 kg/m2  SpO2 96%  Physical Exam 1905: Physical examination:  Nursing notes reviewed; Vital signs and O2 SAT reviewed;  Constitutional: Well developed, Well nourished, Well hydrated, In no acute distress; Head:  Normocephalic, atraumatic; Eyes: EOMI, PERRL, No scleral icterus; ENMT: Mouth and pharynx normal, Mucous membranes moist; Neck: Supple, Full range of motion, No lymphadenopathy; Cardiovascular: Regular rate and rhythm, No murmur, rub, or gallop; Respiratory: Breath sounds clear & equal bilaterally, No rales, rhonchi,  wheezes, or rub, Normal respiratory effort/excursion; Chest: Nontender, Movement normal; Abdomen: Soft, Nontender, Nondistended, Normal bowel sounds; Extremities: Pulses normal, No tenderness, No edema, No calf edema or asymmetry.; Neuro: AA&Ox3, Major CN grossly intact.  No gross focal motor or sensory deficits in extremities.; Skin: Color normal, Warm, Dry, +approx 2cm diameter fluctuant abscess to right lower abd wall area with central pointing area and surrounding erythema, no drainage, no streaking, no soft tissue crepitus.    ED Course  INCISION AND DRAINAGE Date/Time: 08/14/2011 8:20 PM Performed by: Kathie Dike Authorized by: Kathie Dike Consent: Verbal consent obtained. Risks and benefits: risks, benefits and alternatives were discussed Consent given by: patient Patient understanding: patient states understanding of the procedure being performed Patient identity confirmed: verbally with patient Time out: Immediately prior to procedure a "time out" was called to verify the correct patient, procedure, equipment, support staff and site/side marked as required. Type: abscess Body area: trunk Location details: abdomen Anesthesia: local infiltration Local anesthetic: bupivacaine 0.5% without epinephrine Scalpel size: 11 Incision type: single straight Complexity: complex Drainage: purulent Drainage amount: moderate Packing material: 1/4 in iodoform gauze Patient tolerance: Patient tolerated the procedure well with no immediate complications. Comments: Culture sent to the lab. Sterile dressing by me.     MDM  MDM Reviewed: nursing note, previous chart and vitals Reviewed previous: ECG Interpretation: ECG, labs and x-ray    Date: 08/14/2011  Rate: 85  Rhythm: normal sinus rhythm  QRS Axis: normal  Intervals: normal  ST/T Wave abnormalities: normal  Conduction Disutrbances:nonspecific intraventricular conduction delay  Narrative Interpretation:   Old EKG Reviewed:  unchanged; no significant changes from previous EKG dated 02/15/2011.   Results for orders placed during the hospital encounter of 08/14/11  CBC      Component Value Range   WBC 6.6  4.0 - 10.5 (K/uL)   RBC 4.76  3.87 - 5.11 (MIL/uL)   Hemoglobin 13.3  12.0 - 15.0 (g/dL)   HCT 16.1  09.6 - 04.5 (%)   MCV 82.6  78.0 - 100.0 (fL)   MCH 27.9  26.0 - 34.0 (pg)   MCHC 33.8  30.0 - 36.0 (g/dL)   RDW 40.9  81.1 - 91.4 (%)   Platelets 204  150 - 400 (K/uL)  DIFFERENTIAL      Component Value Range   Neutrophils Relative 62  43 - 77 (%)   Neutro Abs 4.1  1.7 - 7.7 (K/uL)   Lymphocytes Relative 27  12 - 46 (%)   Lymphs Abs 1.8  0.7 - 4.0 (K/uL)   Monocytes Relative 9  3 - 12 (%)   Monocytes Absolute 0.6  0.1 - 1.0 (K/uL)   Eosinophils Relative 2  0 - 5 (%)   Eosinophils Absolute 0.1  0.0 - 0.7 (K/uL)   Basophils Relative 0  0 - 1 (%)   Basophils Absolute 0.0  0.0 - 0.1 (K/uL)  COMPREHENSIVE METABOLIC PANEL      Component Value  Range   Sodium 134 (*) 135 - 145 (mEq/L)   Potassium 3.9  3.5 - 5.1 (mEq/L)   Chloride 97  96 - 112 (mEq/L)   CO2 29  19 - 32 (mEq/L)   Glucose, Bld 325 (*) 70 - 99 (mg/dL)   BUN 10  6 - 23 (mg/dL)   Creatinine, Ser 8.29  0.50 - 1.10 (mg/dL)   Calcium 9.4  8.4 - 56.2 (mg/dL)   Total Protein 7.2  6.0 - 8.3 (g/dL)   Albumin 3.7  3.5 - 5.2 (g/dL)   AST 31  0 - 37 (U/L)   ALT 42 (*) 0 - 35 (U/L)   Alkaline Phosphatase 100  39 - 117 (U/L)   Total Bilirubin 0.2 (*) 0.3 - 1.2 (mg/dL)   GFR calc non Af Amer >90  >90 (mL/min)   GFR calc Af Amer >90  >90 (mL/min)  LIPASE, BLOOD      Component Value Range   Lipase 59  11 - 59 (U/L)  TROPONIN I      Component Value Range   Troponin I <0.30  <0.30 (ng/mL)  LACTIC ACID, PLASMA      Component Value Range   Lactic Acid, Venous 1.7  0.5 - 2.2 (mmol/L)  PROCALCITONIN      Component Value Range   Procalcitonin <0.10      Dg Abd Acute W/chest 08/14/2011  *RADIOLOGY REPORT*  Clinical Data: Chest pain and abdominal  pain.  ACUTE ABDOMEN SERIES (ABDOMEN 2 VIEW & CHEST 1 VIEW)  Comparison: Chest x-ray 02/12/2011.  Findings: Cardiomegaly is stable.  The lung bases are clear.  Moderate stool is present throughout the colon.  There is no evidence for obstruction or free air.  The axial skeleton is unremarkable.  IMPRESSION:  1.  Stable cardiomegaly without failure. 2.  Moderate stool burden throughout the colon.  Original Report Authenticated By: Jamesetta Orleans. MATTERN, M.D.     9:23 PM:  Pt states her pain is completely relieved after dilaudid and zofran and she feels "much better now."  Abscess I&D'd by PA Beverely Pace, culture pending.  Pt agreeable to stay for 2nd EKG and troponin check at 2330 (4 hour delta).   Doubt ACS as cause for her CP today, as pt's symptoms have been constant for the past 1 week, EKG is unchanged from previous, and troponin is negative.  Known hx of DM, not acidotic today.  UA contaminated.   2200:  Pt states she is "better now" and wants to go home to f/u with her Cards MD on Monday.  Pt has not had N/V while in the ED, has tol PO well.  Pt and family informed re: dx testing results, and that I recommend a 2nd EKG and troponin level for further evaluation.  Pt refuses to stay.  I encouraged pt to stay, continues to refuse.  Pt makes her own medical decisions.  Risks of AMA explained to pt and family, including, but not limited to:  stroke, heart attack, cardiac arrythmia ("irregular heart rate/beat"), "passing out," temporary and/or permanent disability, death.  Pt and family verb understanding and continue to refuse to stay, understanding the consequences of their decision.  I encouraged pt to follow up with her PMD on Monday and return to the ED immediately if symptoms return, or for any other concerns.  Pt and family verb understanding, agreeable.       Only the procedure above (I&D of abscess) was performed by midlevel.  I personally evaluated the  patient during the encounter.     Anita [name] Allison Quarry, DO 08/15/11 1536

## 2011-08-14 NOTE — Discharge Instructions (Signed)
RESOURCE GUIDE  Dental Problems  Patients with Medicaid: [facility]                     [facility] [address]                                           [address] Phone:  [phone]                                                  Phone:  [phone]  If unable to pay or uninsured, contact:  [facility] or [facility] to become qualified for the adult dental clinic.  Chronic Pain Problems Contact [facility]  [phone] Patients need to be referred by their primary care doctor.  Insufficient Money for Medicine Contact [employer]:  call "211" or [facility] 271-5999.  No Primary Care Doctor Call [facility]  [phone] Other agencies that provide inexpensive medical care    [facility]  [phone]    [facility]  [phone]    [facility]  [phone]    Women's Clinic  [phone]    [facility]  [phone]    [facility]  [phone]  Psychological Services [facility]  [phone] [facility]  [phone] [facility]   [phone] (emergency services [phone])  Substance Abuse Resources [facility]  336-882-2125 [facility] 336-784-9470 [facility] 336-285-9073 [facility] 336-845-3988 Residential & Outpatient Substance Abuse Program  800-659-3381  Abuse/Neglect [place] Child Abuse Hotline (336) 641-3795 [place] Child Abuse Hotline 800-378-5315 (After Hours)  Emergency Shelter [facility] (336) 271-5985  Maternity Homes [facility] (336) 275-9566 [facility] (704) 372-4663  MRSA Hotline #:   832-7006    [place] Resources  [facility] [place]     [employer]                          [facility] [address] [place]                       [address]      [address]  [place]                                                [place]                            [place] Phone:  [phone]                                   Phone:  [phone]                 Phone:  [phone]  [facility] Phone:  [phone]    [facility] Child Abuse Hotline 239 221 6345 (662)667-8732 (After Hours)   Take the new prescription as directed.  It is ok to shower and/or sit in a warm tub of water, this may help ease some of your discomfort and encourage the area to drain.  Take over the counter laxative (such as miralax, milk of magnesia, senokot) and repeat tomorrow.  Begin to take over the counter stool softener (colace), as directed on packaging, for the next month.  Continue to take your usual prescriptions as previously directed.  Call your regular medical doctor on Monday to schedule a follow up appointment within the next 48 hours to have your packing removed.  Also call your Cardiologist and your Pain Management doctor on Monday to schedule a follow up appointment within the next 3 to 4 days  Return to the Emergency Department immediately sooner if worsening.

## 2011-08-16 LAB — URINE CULTURE: Colony Count: 7000

## 2011-08-20 DIAGNOSIS — L0291 Cutaneous abscess, unspecified: Secondary | ICD-10-CM | POA: Diagnosis not present

## 2011-08-23 DIAGNOSIS — Z6832 Body mass index (BMI) 32.0-32.9, adult: Secondary | ICD-10-CM | POA: Diagnosis not present

## 2011-08-23 DIAGNOSIS — L0291 Cutaneous abscess, unspecified: Secondary | ICD-10-CM | POA: Diagnosis not present

## 2011-08-23 DIAGNOSIS — L0390 Cellulitis, unspecified: Secondary | ICD-10-CM | POA: Diagnosis not present

## 2011-08-26 DIAGNOSIS — Z6832 Body mass index (BMI) 32.0-32.9, adult: Secondary | ICD-10-CM | POA: Diagnosis not present

## 2011-08-26 DIAGNOSIS — E119 Type 2 diabetes mellitus without complications: Secondary | ICD-10-CM | POA: Diagnosis not present

## 2011-08-26 DIAGNOSIS — L02818 Cutaneous abscess of other sites: Secondary | ICD-10-CM | POA: Diagnosis not present

## 2011-09-02 DIAGNOSIS — L03319 Cellulitis of trunk, unspecified: Secondary | ICD-10-CM | POA: Diagnosis not present

## 2011-09-08 DIAGNOSIS — G894 Chronic pain syndrome: Secondary | ICD-10-CM | POA: Diagnosis not present

## 2011-09-08 DIAGNOSIS — M5030 Other cervical disc degeneration, unspecified cervical region: Secondary | ICD-10-CM | POA: Diagnosis not present

## 2011-09-09 DIAGNOSIS — L02219 Cutaneous abscess of trunk, unspecified: Secondary | ICD-10-CM | POA: Diagnosis not present

## 2011-09-30 DIAGNOSIS — I10 Essential (primary) hypertension: Secondary | ICD-10-CM | POA: Diagnosis not present

## 2011-09-30 DIAGNOSIS — F411 Generalized anxiety disorder: Secondary | ICD-10-CM | POA: Diagnosis not present

## 2011-09-30 DIAGNOSIS — E119 Type 2 diabetes mellitus without complications: Secondary | ICD-10-CM | POA: Diagnosis not present

## 2011-09-30 DIAGNOSIS — R300 Dysuria: Secondary | ICD-10-CM | POA: Diagnosis not present

## 2011-09-30 DIAGNOSIS — Z6832 Body mass index (BMI) 32.0-32.9, adult: Secondary | ICD-10-CM | POA: Diagnosis not present

## 2011-10-06 DIAGNOSIS — M47812 Spondylosis without myelopathy or radiculopathy, cervical region: Secondary | ICD-10-CM | POA: Diagnosis not present

## 2011-10-06 DIAGNOSIS — M531 Cervicobrachial syndrome: Secondary | ICD-10-CM | POA: Diagnosis not present

## 2011-10-06 DIAGNOSIS — G894 Chronic pain syndrome: Secondary | ICD-10-CM | POA: Diagnosis not present

## 2011-11-03 DIAGNOSIS — G894 Chronic pain syndrome: Secondary | ICD-10-CM | POA: Diagnosis not present

## 2011-11-03 DIAGNOSIS — M47812 Spondylosis without myelopathy or radiculopathy, cervical region: Secondary | ICD-10-CM | POA: Diagnosis not present

## 2011-11-03 DIAGNOSIS — M531 Cervicobrachial syndrome: Secondary | ICD-10-CM | POA: Diagnosis not present

## 2011-11-25 DIAGNOSIS — G894 Chronic pain syndrome: Secondary | ICD-10-CM | POA: Diagnosis not present

## 2011-11-25 DIAGNOSIS — M47812 Spondylosis without myelopathy or radiculopathy, cervical region: Secondary | ICD-10-CM | POA: Diagnosis not present

## 2011-11-25 DIAGNOSIS — M5030 Other cervical disc degeneration, unspecified cervical region: Secondary | ICD-10-CM | POA: Diagnosis not present

## 2011-12-03 ENCOUNTER — Encounter (HOSPITAL_COMMUNITY): Payer: Self-pay | Admitting: Emergency Medicine

## 2011-12-03 ENCOUNTER — Emergency Department (HOSPITAL_COMMUNITY)
Admission: EM | Admit: 2011-12-03 | Discharge: 2011-12-03 | Disposition: A | Discharge status: Home / Self Care | Payer: Medicare Other | Attending: Emergency Medicine | Admitting: Emergency Medicine

## 2011-12-03 DIAGNOSIS — G43909 Migraine, unspecified, not intractable, without status migrainosus: Secondary | ICD-10-CM | POA: Diagnosis not present

## 2011-12-03 DIAGNOSIS — G609 Hereditary and idiopathic neuropathy, unspecified: Secondary | ICD-10-CM | POA: Diagnosis not present

## 2011-12-03 DIAGNOSIS — R51 Headache: Secondary | ICD-10-CM | POA: Diagnosis not present

## 2011-12-03 DIAGNOSIS — E119 Type 2 diabetes mellitus without complications: Secondary | ICD-10-CM | POA: Diagnosis not present

## 2011-12-03 DIAGNOSIS — Z90710 Acquired absence of both cervix and uterus: Secondary | ICD-10-CM | POA: Diagnosis not present

## 2011-12-03 DIAGNOSIS — F17200 Nicotine dependence, unspecified, uncomplicated: Secondary | ICD-10-CM | POA: Diagnosis not present

## 2011-12-03 DIAGNOSIS — I10 Essential (primary) hypertension: Secondary | ICD-10-CM | POA: Diagnosis not present

## 2011-12-03 MED ORDER — PROMETHAZINE HCL 25 MG/ML IJ SOLN
25.0000 mg | Freq: Once | INTRAMUSCULAR | Status: AC
  Administered 2011-12-03: 25 mg via INTRAMUSCULAR
  Filled 2011-12-03: qty 1

## 2011-12-03 MED ORDER — HYDROMORPHONE HCL PF 2 MG/ML IJ SOLN
2.0000 mg | Freq: Once | INTRAMUSCULAR | Status: AC
  Administered 2011-12-03: 2 mg via INTRAMUSCULAR
  Filled 2011-12-03: qty 1

## 2011-12-03 NOTE — ED Provider Notes (Signed)
History     CSN: [id number]  Arrival date & time 12/03/11  0344   First MD Initiated Contact with Patient 12/03/11 0408      Chief Complaint  Patient presents with  . Headache  . Emesis    (Consider location/radiation/quality/duration/timing/severity/associated sxs/prior treatment) HPI  Anita [name] is a 51 y.o. female with a h/o migraine headaches under the care of pain management in [place] (Dr. Vear Clock) who presents to the Emergency Department complaining of headache that began 2 days ago accompanied by nausea and vomiting.Deneis vision changes, stiff neck, difficulty speaking or swallowing, numbness or tingling, extremity weakness. Pain is similar to previous headaches.   PCP Dr. Sherwood Gambler Pain management Dr. Vear Clock  Past Medical History  Diagnosis Date  . Nerve damage     to neck.  . Pain management   . Hypertension   . Diabetes mellitus   . Peripheral neuropathy   . Chronic back pain   . Chronic neck pain   . Migraine headache   . Chronic nausea   . Incomplete RBBB   . Chronic abdominal pain   . History of cardiac catheterization 02/15/11 Dr. Nicki Guadalajara    Past Surgical History  Procedure Date  . Leg surgery   . Asd repair   . Cardiac catheterization   . Neck surgery   . Abdominal hysterectomy     No family history on file.  History  Substance Use Topics  . Smoking status: Current Everyday Smoker  . Smokeless tobacco: Not on file  . Alcohol Use: No    OB History    Grav Para Term Preterm Abortions TAB SAB Ect Mult Living                  Review of Systems  Constitutional: Negative for fever.       10 Systems reviewed and are negative for acute change except as noted in the HPI.  HENT: Positive for neck pain. Negative for congestion.   Eyes: Negative for discharge and redness.  Respiratory: Negative for cough and shortness of breath.   Cardiovascular: Negative for chest pain.  Gastrointestinal: Positive for nausea and vomiting. Negative  for abdominal pain.  Musculoskeletal: Positive for back pain.  Skin: Negative for rash.  Neurological: Positive for headaches. Negative for syncope and numbness.  Psychiatric/Behavioral:       No behavior change.    Allergies  Morphine and related; Other; and Penicillins  Home Medications  No current outpatient prescriptions on file.  BP 192/86  Pulse 116  Temp 97.6 F (36.4 C) (Oral)  Resp 22  Ht 5\' 4"  (1.626 m)  Wt 165 lb (74.844 kg)  BMI 28.32 kg/m2  SpO2 95%  Physical Exam  Nursing note and vitals reviewed. Constitutional: She is oriented to person, place, and time. She appears well-developed and well-nourished. She appears distressed.       Awake, alert,uncomfortable, pacing the floor  HENT:  Head: Atraumatic.  Right Ear: External ear normal.  Left Ear: External ear normal.  Nose: Nose normal.  Mouth/Throat: Oropharynx is clear and moist.  Eyes: Conjunctivae and EOM are normal. Pupils are equal, round, and reactive to light. Right eye exhibits no discharge. Left eye exhibits no discharge.  Neck: Neck supple.       Tenderness to posterior neck to palpation  Cardiovascular: Regular rhythm and intact distal pulses.   Pulmonary/Chest: Effort normal. She exhibits no tenderness.  Abdominal: Soft. There is no tenderness. There is no rebound.  Musculoskeletal:  She exhibits no tenderness.       Baseline ROM, no obvious new focal weakness.  Neurological: She is alert and oriented to person, place, and time. She displays normal reflexes. No cranial nerve deficit. Coordination normal.       Mental status and motor strength appears baseline for patient and situation.No facial asymetry speech is normal  Skin: Skin is warm and dry. No rash noted.  Psychiatric: She has a normal mood and affect.    ED Course  Procedures (including critical care time)     MDM  Patient with recurrent migraine headaches who normally receives dilaudid and phenergan with enough relief to go home  and sleep.PE is unremarkable. No focal neurological deficits. Given dilaudid and phenergan. Pt stable in ED with no significant deterioration in condition.The patient appears reasonably screened and/or stabilized for discharge and I doubt any other medical condition or other [facility] requiring further screening, evaluation, or treatment in the ED at this time prior to discharge.  MDM Reviewed: nursing note and vitals           Nicoletta Dress. Colon Branch, MD 12/03/11 [phone]

## 2011-12-03 NOTE — ED Notes (Signed)
Patient has history of headaches; states when they get bad, she can't keep down her medications.  Patient states has been vomiting x 2 days.

## 2011-12-23 DIAGNOSIS — G894 Chronic pain syndrome: Secondary | ICD-10-CM | POA: Diagnosis not present

## 2011-12-23 DIAGNOSIS — M531 Cervicobrachial syndrome: Secondary | ICD-10-CM | POA: Diagnosis not present

## 2011-12-23 DIAGNOSIS — M47812 Spondylosis without myelopathy or radiculopathy, cervical region: Secondary | ICD-10-CM | POA: Diagnosis not present

## 2012-01-12 ENCOUNTER — Encounter (HOSPITAL_COMMUNITY): Payer: Self-pay | Admitting: *Deleted

## 2012-01-12 ENCOUNTER — Emergency Department (HOSPITAL_COMMUNITY)
Admission: EM | Admit: 2012-01-12 | Discharge: 2012-01-12 | Disposition: A | Discharge status: Home / Self Care | Payer: Medicare Other | Attending: Emergency Medicine | Admitting: Emergency Medicine

## 2012-01-12 DIAGNOSIS — Z888 Allergy status to other drugs, medicaments and biological substances status: Secondary | ICD-10-CM | POA: Diagnosis not present

## 2012-01-12 DIAGNOSIS — Z87891 Personal history of nicotine dependence: Secondary | ICD-10-CM | POA: Insufficient documentation

## 2012-01-12 DIAGNOSIS — Z87828 Personal history of other (healed) physical injury and trauma: Secondary | ICD-10-CM | POA: Insufficient documentation

## 2012-01-12 DIAGNOSIS — Z79899 Other long term (current) drug therapy: Secondary | ICD-10-CM | POA: Insufficient documentation

## 2012-01-12 DIAGNOSIS — R110 Nausea: Secondary | ICD-10-CM | POA: Diagnosis not present

## 2012-01-12 DIAGNOSIS — I10 Essential (primary) hypertension: Secondary | ICD-10-CM | POA: Diagnosis not present

## 2012-01-12 DIAGNOSIS — E119 Type 2 diabetes mellitus without complications: Secondary | ICD-10-CM | POA: Diagnosis not present

## 2012-01-12 DIAGNOSIS — R51 Headache: Secondary | ICD-10-CM | POA: Diagnosis not present

## 2012-01-12 DIAGNOSIS — Z880 Allergy status to penicillin: Secondary | ICD-10-CM | POA: Insufficient documentation

## 2012-01-12 MED ORDER — HYDROMORPHONE HCL PF 1 MG/ML IJ SOLN
1.0000 mg | Freq: Once | INTRAMUSCULAR | Status: AC
  Administered 2012-01-12: 1 mg via INTRAMUSCULAR
  Filled 2012-01-12: qty 1

## 2012-01-12 MED ORDER — PROMETHAZINE HCL 25 MG/ML IJ SOLN
25.0000 mg | Freq: Once | INTRAMUSCULAR | Status: DC
  Filled 2012-01-12: qty 1

## 2012-01-12 MED ORDER — PROMETHAZINE HCL 25 MG/ML IJ SOLN
25.0000 mg | Freq: Once | INTRAMUSCULAR | Status: AC
  Administered 2012-01-12: 25 mg via INTRAMUSCULAR

## 2012-01-12 NOTE — ED Notes (Signed)
Pt states she can still feel the pain in the back of her head but it is much better.

## 2012-01-12 NOTE — ED Notes (Signed)
Headache , and neck pain.

## 2012-01-14 ENCOUNTER — Emergency Department (HOSPITAL_COMMUNITY): Payer: Medicare Other

## 2012-01-14 ENCOUNTER — Encounter (HOSPITAL_COMMUNITY): Payer: Self-pay | Admitting: Emergency Medicine

## 2012-01-14 ENCOUNTER — Emergency Department (HOSPITAL_COMMUNITY)
Admission: EM | Admit: 2012-01-14 | Discharge: 2012-01-14 | Disposition: A | Discharge status: Home / Self Care | Payer: Medicare Other | Attending: Emergency Medicine | Admitting: Emergency Medicine

## 2012-01-14 DIAGNOSIS — M25529 Pain in unspecified elbow: Secondary | ICD-10-CM | POA: Diagnosis not present

## 2012-01-14 DIAGNOSIS — S5000XA Contusion of unspecified elbow, initial encounter: Secondary | ICD-10-CM | POA: Insufficient documentation

## 2012-01-14 DIAGNOSIS — G8929 Other chronic pain: Secondary | ICD-10-CM | POA: Diagnosis not present

## 2012-01-14 DIAGNOSIS — S8000XA Contusion of unspecified knee, initial encounter: Secondary | ICD-10-CM | POA: Insufficient documentation

## 2012-01-14 DIAGNOSIS — E119 Type 2 diabetes mellitus without complications: Secondary | ICD-10-CM | POA: Diagnosis not present

## 2012-01-14 DIAGNOSIS — Z87891 Personal history of nicotine dependence: Secondary | ICD-10-CM | POA: Diagnosis not present

## 2012-01-14 DIAGNOSIS — I10 Essential (primary) hypertension: Secondary | ICD-10-CM | POA: Insufficient documentation

## 2012-01-14 DIAGNOSIS — Z043 Encounter for examination and observation following other accident: Secondary | ICD-10-CM | POA: Diagnosis not present

## 2012-01-14 DIAGNOSIS — M25569 Pain in unspecified knee: Secondary | ICD-10-CM | POA: Diagnosis not present

## 2012-01-14 DIAGNOSIS — W010XXA Fall on same level from slipping, tripping and stumbling without subsequent striking against object, initial encounter: Secondary | ICD-10-CM | POA: Insufficient documentation

## 2012-01-14 NOTE — ED Provider Notes (Signed)
History     CSN: [id number]  Arrival date & time 01/14/12  1307   First MD Initiated Contact with Patient 01/14/12 1502      Chief Complaint  Patient presents with  . Fall  . Knee Pain  . Elbow Pain    (Consider location/radiation/quality/duration/timing/severity/associated sxs/prior treatment) Patient is a 51 y.o. female presenting with fall and knee pain. The history is provided by the patient.  Fall The accident occurred 3 to 5 hours ago. Incident: Patient tripped over her dog and sustained a fall. She landed on carpet. The pain is present in the left elbow and left knee. The pain is moderate. She was ambulatory at the scene. Associated symptoms include abdominal pain and headaches. Pertinent negatives include no hematuria. The symptoms are aggravated by standing. She has tried nothing for the symptoms. The treatment provided no relief.  Knee Pain Associated symptoms include abdominal pain, chest pain and headaches. Pertinent negatives include no arthralgias, coughing or neck pain.    Past Medical History  Diagnosis Date  . Nerve damage     to neck.  . Pain management   . Hypertension   . Diabetes mellitus   . Peripheral neuropathy   . Chronic back pain   . Chronic neck pain   . Migraine headache   . Chronic nausea   . Incomplete RBBB   . Chronic abdominal pain   . History of cardiac catheterization 02/15/11 Dr. Nicki Guadalajara    Past Surgical History  Procedure Date  . Leg surgery   . Asd repair   . Neck surgery   . Abdominal hysterectomy   . Cardiac catheterization     History reviewed. No pertinent family history.  History  Substance Use Topics  . Smoking status: Former Games developer  . Smokeless tobacco: Not on file  . Alcohol Use: No    OB History    Grav Para Term Preterm Abortions TAB SAB Ect Mult Living                  Review of Systems  Constitutional: Negative for activity change.       All ROS Neg except as noted in HPI  HENT: Negative for  nosebleeds and neck pain.   Eyes: Negative for photophobia and discharge.  Respiratory: Negative for cough, shortness of breath and wheezing.   Cardiovascular: Positive for chest pain. Negative for palpitations.  Gastrointestinal: Positive for abdominal pain. Negative for blood in stool.  Genitourinary: Negative for dysuria, frequency and hematuria.  Musculoskeletal: Positive for back pain. Negative for arthralgias.  Skin: Negative.   Neurological: Positive for headaches. Negative for dizziness, seizures and speech difficulty.  Psychiatric/Behavioral: Negative for hallucinations and confusion.    Allergies  Imitrex and Penicillins  Home Medications   Current Outpatient Rx  Name Route Sig Dispense Refill  . ALPRAZOLAM 2 MG PO TABS Oral Take 2 mg by mouth 3 (three) times daily.    Marland Kitchen METHADONE HCL 5 MG PO TABS Oral Take 5 mg by mouth every 8 (eight) hours.    Marland Kitchen RAMIPRIL 10 MG PO CAPS Oral Take 20 mg by mouth daily.    Marland Kitchen SAXAGLIPTIN HCL 5 MG PO TABS Oral Take 5 mg by mouth daily.    . NUCYNTA PO Oral Take 1 tablet by mouth every 6 (six) hours as needed. For pain      BP 186/100  Pulse 101  Temp 98.1 F (36.7 C) (Oral)  Resp 20  Ht 5\' 5"  (1.651  m)  Wt 150 lb (68.04 kg)  BMI 24.96 kg/m2  SpO2 97%  Physical Exam  Nursing note and vitals reviewed. Constitutional: She is oriented to person, place, and time. She appears well-developed and well-nourished.  Non-toxic appearance.  HENT:  Head: Normocephalic.  Right Ear: Tympanic membrane and external ear normal.  Left Ear: Tympanic membrane and external ear normal.  Eyes: EOM and lids are normal. Pupils are equal, round, and reactive to light.  Neck: Normal range of motion. Neck supple. Carotid bruit is not present.  Cardiovascular: Normal rate, regular rhythm, normal heart sounds, intact distal pulses and normal pulses.   Pulmonary/Chest: Breath sounds normal. No respiratory distress.  Abdominal: Soft. Bowel sounds are normal.  There is no tenderness. There is no guarding.  Musculoskeletal: Normal range of motion.       Bruise area and soreness with range of motion of the left elbow. No effusion appreciated. No deformity. Bruise and soreness with attempted range of motion of the left knee. No deformity appreciated. No effusion present. No posterior mass present. Achilles tendon is intact. Dorsalis pedis pulses are symmetrical.  Lymphadenopathy:       Head (right side): No submandibular adenopathy present.       Head (left side): No submandibular adenopathy present.    She has no cervical adenopathy.  Neurological: She is alert and oriented to person, place, and time. She has normal strength. No cranial nerve deficit or sensory deficit.  Skin: Skin is warm and dry.  Psychiatric: She has a normal mood and affect. Her speech is normal.    ED Course  Procedures (including critical care time)  Labs Reviewed - No data to display Dg Elbow Complete Left  01/14/2012  *RADIOLOGY REPORT*  Clinical Data: Larey Seat recently with pain  LEFT ELBOW - COMPLETE 3+ VIEW  Comparison: None.  Findings: No acute fracture is seen.  Alignment is normal.  No joint effusion is noted.  IMPRESSION: Negative.  Original Report Authenticated By: Juline Patch, M.D.   Dg Knee Complete 4 Views Left  01/14/2012  *RADIOLOGY REPORT*  Clinical Data: Larey Seat recently with pain  LEFT KNEE - COMPLETE 4+ VIEW  Comparison: None.  Findings: The left knee joint spaces are relatively normal.  No fracture is seen.  No effusion is noted.  No significant degenerative change is seen.  IMPRESSION: No significant abnormality.  No effusion.  Original Report Authenticated By: Juline Patch, M.D.     1. Contusion, elbow   2. Contusion, knee       MDM  I have reviewed nursing notes, vital signs, and all appropriate lab and imaging results for this patient. X-ray of the left elbow is negative. X-ray of the left knee is negative for fracture or dislocation. Results given  to the patient and her significant other. Patient is to see her primary physician if any changes, problems, or concerns.       Kathie Dike, [place] 01/14/12 [phone]

## 2012-01-14 NOTE — ED Notes (Signed)
Pt states she tripped over the dog and now c/o left elbow and knee pain.

## 2012-01-14 NOTE — ED Notes (Signed)
Pt tripped over her dog today, c/o pain to left knee and left elbow, denies hitting her head today

## 2012-01-14 NOTE — ED Notes (Signed)
H. Bryant, PA at bedside. 

## 2012-01-15 NOTE — ED Provider Notes (Signed)
Medical screening examination/treatment/procedure(s) were performed by non-physician practitioner and as supervising physician I was immediately available for consultation/collaboration.   Ellison Rieth III, MD 01/15/12 1251 
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
## 2012-01-17 NOTE — ED Provider Notes (Signed)
Medical screening examination/treatment/procedure(s) were performed by non-physician practitioner and as supervising physician I was immediately available for consultation/collaboration. Maida Widger, MD, FACEP   Jirah Rider L Harlin Mazzoni, MD 01/17/12 0658 

## 2012-02-17 DIAGNOSIS — M47812 Spondylosis without myelopathy or radiculopathy, cervical region: Secondary | ICD-10-CM | POA: Diagnosis not present

## 2012-02-17 DIAGNOSIS — M531 Cervicobrachial syndrome: Secondary | ICD-10-CM | POA: Diagnosis not present

## 2012-02-17 DIAGNOSIS — G894 Chronic pain syndrome: Secondary | ICD-10-CM | POA: Diagnosis not present

## 2012-03-08 ENCOUNTER — Encounter (HOSPITAL_COMMUNITY): Payer: Self-pay

## 2012-03-08 ENCOUNTER — Observation Stay (HOSPITAL_COMMUNITY)
Admission: EM | Admit: 2012-03-08 | Discharge: 2012-03-09 | Disposition: A | Discharge status: Home / Self Care | Payer: Medicare Other | Attending: Internal Medicine | Admitting: Internal Medicine

## 2012-03-08 ENCOUNTER — Observation Stay (HOSPITAL_COMMUNITY): Payer: Medicare Other

## 2012-03-08 DIAGNOSIS — J9819 Other pulmonary collapse: Secondary | ICD-10-CM | POA: Diagnosis not present

## 2012-03-08 DIAGNOSIS — E785 Hyperlipidemia, unspecified: Secondary | ICD-10-CM | POA: Insufficient documentation

## 2012-03-08 DIAGNOSIS — E119 Type 2 diabetes mellitus without complications: Secondary | ICD-10-CM | POA: Insufficient documentation

## 2012-03-08 DIAGNOSIS — I200 Unstable angina: Secondary | ICD-10-CM | POA: Insufficient documentation

## 2012-03-08 DIAGNOSIS — R079 Chest pain, unspecified: Secondary | ICD-10-CM | POA: Diagnosis not present

## 2012-03-08 DIAGNOSIS — I4510 Unspecified right bundle-branch block: Secondary | ICD-10-CM | POA: Insufficient documentation

## 2012-03-08 DIAGNOSIS — G8929 Other chronic pain: Secondary | ICD-10-CM | POA: Diagnosis not present

## 2012-03-08 DIAGNOSIS — R9431 Abnormal electrocardiogram [ECG] [EKG]: Secondary | ICD-10-CM | POA: Diagnosis not present

## 2012-03-08 DIAGNOSIS — I2510 Atherosclerotic heart disease of native coronary artery without angina pectoris: Secondary | ICD-10-CM | POA: Diagnosis not present

## 2012-03-08 DIAGNOSIS — R002 Palpitations: Secondary | ICD-10-CM | POA: Diagnosis not present

## 2012-03-08 DIAGNOSIS — G8921 Chronic pain due to trauma: Secondary | ICD-10-CM | POA: Diagnosis present

## 2012-03-08 DIAGNOSIS — E1165 Type 2 diabetes mellitus with hyperglycemia: Secondary | ICD-10-CM | POA: Diagnosis present

## 2012-03-08 DIAGNOSIS — Q211 Atrial septal defect: Secondary | ICD-10-CM

## 2012-03-08 DIAGNOSIS — I10 Essential (primary) hypertension: Secondary | ICD-10-CM | POA: Insufficient documentation

## 2012-03-08 DIAGNOSIS — E1140 Type 2 diabetes mellitus with diabetic neuropathy, unspecified: Secondary | ICD-10-CM | POA: Diagnosis present

## 2012-03-08 LAB — CBC WITH DIFFERENTIAL/PLATELET
Basophils Absolute: 0 10*3/uL (ref 0.0–0.1)
HCT: 42.8 % (ref 36.0–46.0)
Lymphocytes Relative: 24 % (ref 12–46)
Lymphs Abs: 1.7 10*3/uL (ref 0.7–4.0)
Monocytes Absolute: 0.5 10*3/uL (ref 0.1–1.0)
Neutro Abs: 4.7 10*3/uL (ref 1.7–7.7)
Platelets: 201 10*3/uL (ref 150–400)
RBC: 5.22 MIL/uL — ABNORMAL HIGH (ref 3.87–5.11)
RDW: 12.5 % (ref 11.5–15.5)
WBC: 7 10*3/uL (ref 4.0–10.5)

## 2012-03-08 LAB — BASIC METABOLIC PANEL
CO2: 30 mEq/L (ref 19–32)
Chloride: 94 mEq/L — ABNORMAL LOW (ref 96–112)
Glucose, Bld: 313 mg/dL — ABNORMAL HIGH (ref 70–99)
Sodium: 131 mEq/L — ABNORMAL LOW (ref 135–145)

## 2012-03-08 LAB — MRSA PCR SCREENING: MRSA by PCR: NEGATIVE

## 2012-03-08 LAB — GLUCOSE, CAPILLARY: Glucose-Capillary: 289 mg/dL — ABNORMAL HIGH (ref 70–99)

## 2012-03-08 LAB — PROTIME-INR
INR: 1 (ref 0.00–1.49)
Prothrombin Time: 13.1 seconds (ref 11.6–15.2)

## 2012-03-08 LAB — TROPONIN I: Troponin I: <0.3 ng/mL (ref <0.30)

## 2012-03-08 MED ORDER — ONDANSETRON HCL 4 MG/2ML IJ SOLN: 4.0000 mg | Freq: Four times a day (QID) | INTRAMUSCULAR | Status: DC | PRN

## 2012-03-08 MED ORDER — ACETAMINOPHEN 325 MG PO TABS: 650.0000 mg | ORAL_TABLET | ORAL | Status: DC | PRN

## 2012-03-08 MED ORDER — INSULIN ASPART 100 UNIT/ML ~~LOC~~ SOLN
0.0000 [IU] | Freq: Every day | SUBCUTANEOUS | Status: DC
  Administered 2012-03-08: 3 [IU] via SUBCUTANEOUS

## 2012-03-08 MED ORDER — INSULIN ASPART 100 UNIT/ML ~~LOC~~ SOLN
0.0000 [IU] | Freq: Three times a day (TID) | SUBCUTANEOUS | Status: DC
  Administered 2012-03-09: 3 [IU] via SUBCUTANEOUS

## 2012-03-08 MED ORDER — ALPRAZOLAM 0.25 MG PO TABS
0.2500 mg | ORAL_TABLET | Freq: Two times a day (BID) | ORAL | Status: DC | PRN
  Administered 2012-03-08: 0.25 mg via ORAL
  Filled 2012-03-08: qty 1

## 2012-03-08 MED ORDER — ZOLPIDEM TARTRATE 5 MG PO TABS
5.0000 mg | ORAL_TABLET | Freq: Every evening | ORAL | Status: DC | PRN
  Administered 2012-03-08: 5 mg via ORAL
  Filled 2012-03-08: qty 1

## 2012-03-08 MED ORDER — TRAMADOL HCL 50 MG PO TABS: 50.0000 mg | ORAL_TABLET | Freq: Four times a day (QID) | ORAL | Status: DC | PRN

## 2012-03-08 MED ORDER — OXYCODONE-ACETAMINOPHEN 5-325 MG PO TABS: 1.0000 | ORAL_TABLET | ORAL | Status: DC | PRN

## 2012-03-08 MED ORDER — NITROGLYCERIN 0.4 MG SL SUBL: 0.4000 mg | SUBLINGUAL_TABLET | SUBLINGUAL | Status: DC | PRN

## 2012-03-08 MED ORDER — ASPIRIN 81 MG PO CHEW: 324.0000 mg | CHEWABLE_TABLET | Freq: Once | ORAL | Status: DC

## 2012-03-08 MED ORDER — LINAGLIPTIN 5 MG PO TABS
5.0000 mg | ORAL_TABLET | Freq: Every day | ORAL | Status: DC
  Administered 2012-03-09: 5 mg via ORAL
  Filled 2012-03-08: qty 1

## 2012-03-08 MED ORDER — ASPIRIN EC 81 MG PO TBEC
81.0000 mg | DELAYED_RELEASE_TABLET | Freq: Every day | ORAL | Status: DC
  Administered 2012-03-09: 81 mg via ORAL
  Filled 2012-03-08: qty 1

## 2012-03-08 MED ORDER — METHADONE HCL 5 MG PO TABS
5.0000 mg | ORAL_TABLET | Freq: Three times a day (TID) | ORAL | Status: DC
  Administered 2012-03-08 – 2012-03-09 (×2): 5 mg via ORAL
  Filled 2012-03-08 (×2): qty 1

## 2012-03-08 MED ORDER — RAMIPRIL 10 MG PO CAPS
20.0000 mg | ORAL_CAPSULE | Freq: Every day | ORAL | Status: DC
  Administered 2012-03-09: 20 mg via ORAL
  Filled 2012-03-08: qty 2

## 2012-03-08 MED ORDER — ENOXAPARIN SODIUM 40 MG/0.4ML ~~LOC~~ SOLN
40.0000 mg | SUBCUTANEOUS | Status: DC
  Administered 2012-03-08: 40 mg via SUBCUTANEOUS
  Filled 2012-03-08 (×2): qty 0.4

## 2012-03-08 MED ORDER — OXYCODONE HCL 5 MG PO TABS
10.0000 mg | ORAL_TABLET | Freq: Four times a day (QID) | ORAL | Status: DC | PRN
  Administered 2012-03-08 – 2012-03-09 (×3): 10 mg via ORAL
  Filled 2012-03-08 (×3): qty 2

## 2012-03-08 NOTE — ED Notes (Signed)
Report given to Barbara RN

## 2012-03-08 NOTE — ED Notes (Signed)
Pt has had chest pain for 2 weeks went to doctor and sent here for eval.  Pt rates pain in chest at 6 on arrival to ED

## 2012-03-08 NOTE — ED Notes (Signed)
Cardiology in to see patient.

## 2012-03-08 NOTE — H&P (Signed)
Pt. Seen and examined. Agree with the NP/PA-C note as written.  51 yo female with history of ASD repair. She has been having intermittent chest pain for 2 weeks. Nitro does not seem to help, at least consistently. She was sent to the ER by her PCP due to an abnormal EKG. I have reviewed her EKG which is unchanged from her prior EKG. There is a RBBB pattern with early repolarization in the septal leads, which is a typical ASD EKG pattern. This does not represent STEMI.  The pain does not seem cardiac in nature, however, she has multiple co-morbidities including HTN, DM2, neuropathy, migraines, etc. Therefore, will admit to observation to r/o ACS. If negative, she may undergo outpatient stress testing in our office.  Chrystie Nose, MD, [facility] Attending Cardiologist [facility]
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
## 2012-03-08 NOTE — ED Notes (Signed)
Meds given for c/o chest pain

## 2012-03-08 NOTE — ED Notes (Signed)
Report called to [facility] on 2500

## 2012-03-08 NOTE — ED Notes (Signed)
Family to bedside.

## 2012-03-09 ENCOUNTER — Encounter (HOSPITAL_COMMUNITY): Payer: Self-pay | Admitting: Family Medicine

## 2012-03-09 DIAGNOSIS — I2510 Atherosclerotic heart disease of native coronary artery without angina pectoris: Secondary | ICD-10-CM | POA: Diagnosis not present

## 2012-03-09 DIAGNOSIS — I200 Unstable angina: Secondary | ICD-10-CM | POA: Diagnosis not present

## 2012-03-09 DIAGNOSIS — I10 Essential (primary) hypertension: Secondary | ICD-10-CM | POA: Diagnosis not present

## 2012-03-09 DIAGNOSIS — R079 Chest pain, unspecified: Secondary | ICD-10-CM | POA: Diagnosis not present

## 2012-03-09 LAB — URINALYSIS, ROUTINE W REFLEX MICROSCOPIC
Bilirubin Urine: NEGATIVE
Glucose, UA: >1000 mg/dL — AB
Hgb urine dipstick: NEGATIVE
Ketones, ur: NEGATIVE mg/dL
Leukocytes, UA: NEGATIVE
Nitrite: NEGATIVE
Protein, ur: NEGATIVE mg/dL
Specific Gravity, Urine: 1.038 — ABNORMAL HIGH (ref 1.005–1.030)
Urobilinogen, UA: 0.2 mg/dL (ref 0.0–1.0)
pH: 6.5 (ref 5.0–8.0)

## 2012-03-09 LAB — COMPREHENSIVE METABOLIC PANEL
ALT: 26 U/L (ref 0–35)
AST: 25 U/L (ref 0–37)
Albumin: 3.5 g/dL (ref 3.5–5.2)
Alkaline Phosphatase: 91 U/L (ref 39–117)
BUN: 8 mg/dL (ref 6–23)
CO2: 33 mEq/L — ABNORMAL HIGH (ref 19–32)
Calcium: 9.5 mg/dL (ref 8.4–10.5)
Chloride: 98 mEq/L (ref 96–112)
Creatinine, Ser: 0.6 mg/dL (ref 0.50–1.10)
GFR calc Af Amer: >90 mL/min (ref >90)
GFR calc non Af Amer: >90 mL/min (ref >90)
Glucose, Bld: 282 mg/dL — ABNORMAL HIGH (ref 70–99)
Potassium: 4.3 mEq/L (ref 3.5–5.1)
Sodium: 138 mEq/L (ref 135–145)
Total Bilirubin: 0.3 mg/dL (ref 0.3–1.2)
Total Protein: 7.1 g/dL (ref 6.0–8.3)

## 2012-03-09 LAB — TROPONIN I
Troponin I: <0.3 ng/mL (ref <0.30)
Troponin I: <0.3 ng/mL (ref <0.30)

## 2012-03-09 LAB — HEMOGLOBIN A1C
Hgb A1c MFr Bld: 10.1 % — ABNORMAL HIGH (ref <5.7)
Mean Plasma Glucose: 243 mg/dL — ABNORMAL HIGH (ref <117)

## 2012-03-09 LAB — URINE MICROSCOPIC-ADD ON

## 2012-03-09 LAB — TSH: TSH: 0.922 u[IU]/mL (ref 0.350–4.500)

## 2012-03-09 MED ORDER — ACETAMINOPHEN 325 MG PO TABS: 650.0000 mg | ORAL_TABLET | ORAL | Status: DC | PRN
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
## 2012-03-09 NOTE — Plan of Care (Signed)
Problem: Phase II Progression Outcomes Goal: Stress Test if indicated Outcome: Completed/Met Date Met:  03/09/12 Will schedule stress test as outpatient
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
## 2012-03-09 NOTE — Progress Notes (Signed)
Pt discharged to home. IV removed, no O2, no chest pain. Discharge, follow-up appt, medication and emergency information given. Pt and family verbalized understanding. Pt belongings and family at bedside. No current questions or complaints. Toniann Dickerson L

## 2012-03-15 DIAGNOSIS — I2510 Atherosclerotic heart disease of native coronary artery without angina pectoris: Secondary | ICD-10-CM | POA: Diagnosis not present

## 2012-03-15 DIAGNOSIS — R9431 Abnormal electrocardiogram [ECG] [EKG]: Secondary | ICD-10-CM | POA: Diagnosis not present

## 2012-03-15 DIAGNOSIS — R079 Chest pain, unspecified: Secondary | ICD-10-CM | POA: Diagnosis not present

## 2012-03-21 DIAGNOSIS — R079 Chest pain, unspecified: Secondary | ICD-10-CM | POA: Diagnosis not present

## 2012-03-21 DIAGNOSIS — I2510 Atherosclerotic heart disease of native coronary artery without angina pectoris: Secondary | ICD-10-CM | POA: Diagnosis not present

## 2012-03-22 ENCOUNTER — Emergency Department (HOSPITAL_COMMUNITY): Payer: Medicare Other

## 2012-03-22 ENCOUNTER — Encounter (HOSPITAL_COMMUNITY): Payer: Self-pay | Admitting: *Deleted

## 2012-03-22 ENCOUNTER — Emergency Department (HOSPITAL_COMMUNITY)
Admission: EM | Admit: 2012-03-22 | Discharge: 2012-03-23 | Disposition: A | Discharge status: Home / Self Care | Payer: Medicare Other | Attending: Emergency Medicine | Admitting: Emergency Medicine

## 2012-03-22 DIAGNOSIS — I10 Essential (primary) hypertension: Secondary | ICD-10-CM | POA: Insufficient documentation

## 2012-03-22 DIAGNOSIS — Z7982 Long term (current) use of aspirin: Secondary | ICD-10-CM | POA: Insufficient documentation

## 2012-03-22 DIAGNOSIS — G8929 Other chronic pain: Secondary | ICD-10-CM | POA: Insufficient documentation

## 2012-03-22 DIAGNOSIS — K5900 Constipation, unspecified: Secondary | ICD-10-CM | POA: Diagnosis not present

## 2012-03-22 DIAGNOSIS — R1110 Vomiting, unspecified: Secondary | ICD-10-CM

## 2012-03-22 DIAGNOSIS — Z79899 Other long term (current) drug therapy: Secondary | ICD-10-CM | POA: Insufficient documentation

## 2012-03-22 DIAGNOSIS — R079 Chest pain, unspecified: Secondary | ICD-10-CM | POA: Diagnosis not present

## 2012-03-22 DIAGNOSIS — Z8669 Personal history of other diseases of the nervous system and sense organs: Secondary | ICD-10-CM | POA: Diagnosis not present

## 2012-03-22 DIAGNOSIS — F17200 Nicotine dependence, unspecified, uncomplicated: Secondary | ICD-10-CM | POA: Insufficient documentation

## 2012-03-22 DIAGNOSIS — E119 Type 2 diabetes mellitus without complications: Secondary | ICD-10-CM | POA: Insufficient documentation

## 2012-03-22 LAB — URINE MICROSCOPIC-ADD ON

## 2012-03-22 LAB — URINALYSIS, ROUTINE W REFLEX MICROSCOPIC
Glucose, UA: >1000 mg/dL — AB
Hgb urine dipstick: NEGATIVE
Protein, ur: NEGATIVE mg/dL

## 2012-03-22 LAB — COMPREHENSIVE METABOLIC PANEL
ALT: 41 U/L — ABNORMAL HIGH (ref 0–35)
AST: 34 U/L (ref 0–37)
CO2: 27 mEq/L (ref 19–32)
Calcium: 10 mg/dL (ref 8.4–10.5)
Chloride: 94 mEq/L — ABNORMAL LOW (ref 96–112)
GFR calc non Af Amer: >90 mL/min (ref >90)
Sodium: 134 mEq/L — ABNORMAL LOW (ref 135–145)

## 2012-03-22 LAB — CBC WITH DIFFERENTIAL/PLATELET
Basophils Absolute: 0 10*3/uL (ref 0.0–0.1)
Lymphocytes Relative: 29 % (ref 12–46)
Neutro Abs: 3.6 10*3/uL (ref 1.7–7.7)
Neutrophils Relative %: 63 % (ref 43–77)
Platelets: 201 10*3/uL (ref 150–400)
RDW: 12.7 % (ref 11.5–15.5)
WBC: 5.7 10*3/uL (ref 4.0–10.5)

## 2012-03-22 LAB — LIPASE, BLOOD: Lipase: 62 U/L — ABNORMAL HIGH (ref 11–59)

## 2012-03-22 MED ORDER — ONDANSETRON HCL 4 MG/2ML IJ SOLN
4.0000 mg | Freq: Once | INTRAMUSCULAR | Status: AC
  Administered 2012-03-22: 4 mg via INTRAVENOUS
  Filled 2012-03-22: qty 2

## 2012-03-22 MED ORDER — SODIUM CHLORIDE 0.9 % IV SOLN
1000.0000 mL | INTRAVENOUS | Status: DC
  Administered 2012-03-22: 1000 mL via INTRAVENOUS

## 2012-03-22 MED ORDER — SODIUM CHLORIDE 0.9 % IV SOLN
1000.0000 mL | Freq: Once | INTRAVENOUS | Status: AC
  Administered 2012-03-22: 1000 mL via INTRAVENOUS

## 2012-03-22 NOTE — ED Notes (Signed)
Pt back in room.
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
## 2012-03-22 NOTE — ED Notes (Addendum)
Chest pain for 3 days, nausea, vomiting, stress test done yesterday.- Dr Tresa Endo-  Constipation for 1 week.  Vaginal and rectal bleeding  For 2 days

## 2012-03-22 NOTE — ED Notes (Signed)
Patient transported to X-ray 

## 2012-03-23 MED ORDER — PROMETHAZINE HCL 25 MG PO TABS: 25.0000 mg | ORAL_TABLET | Freq: Three times a day (TID) | ORAL | Status: DC | PRN

## 2012-03-23 MED ORDER — PROMETHAZINE HCL 25 MG RE SUPP: 25.0000 mg | Freq: Four times a day (QID) | RECTAL | Status: DC | PRN

## 2012-03-23 NOTE — ED Notes (Signed)
Discharge instructions reviewed with pt, questions answered. Pt verbalized understanding.  

## 2012-03-27 ENCOUNTER — Encounter (HOSPITAL_COMMUNITY): Payer: Self-pay | Admitting: *Deleted

## 2012-03-27 ENCOUNTER — Emergency Department (HOSPITAL_COMMUNITY): Payer: Medicare Other

## 2012-03-27 ENCOUNTER — Emergency Department (HOSPITAL_COMMUNITY)
Admission: EM | Admit: 2012-03-27 | Discharge: 2012-03-27 | Disposition: A | Discharge status: Home / Self Care | Payer: Medicare Other | Attending: Emergency Medicine | Admitting: Emergency Medicine

## 2012-03-27 DIAGNOSIS — Z8669 Personal history of other diseases of the nervous system and sense organs: Secondary | ICD-10-CM | POA: Diagnosis not present

## 2012-03-27 DIAGNOSIS — I10 Essential (primary) hypertension: Secondary | ICD-10-CM | POA: Diagnosis not present

## 2012-03-27 DIAGNOSIS — Z90710 Acquired absence of both cervix and uterus: Secondary | ICD-10-CM | POA: Diagnosis not present

## 2012-03-27 DIAGNOSIS — G909 Disorder of the autonomic nervous system, unspecified: Secondary | ICD-10-CM | POA: Insufficient documentation

## 2012-03-27 DIAGNOSIS — Z79899 Other long term (current) drug therapy: Secondary | ICD-10-CM | POA: Insufficient documentation

## 2012-03-27 DIAGNOSIS — F17200 Nicotine dependence, unspecified, uncomplicated: Secondary | ICD-10-CM | POA: Diagnosis not present

## 2012-03-27 DIAGNOSIS — R109 Unspecified abdominal pain: Secondary | ICD-10-CM | POA: Insufficient documentation

## 2012-03-27 DIAGNOSIS — K5900 Constipation, unspecified: Secondary | ICD-10-CM | POA: Diagnosis not present

## 2012-03-27 DIAGNOSIS — E1149 Type 2 diabetes mellitus with other diabetic neurological complication: Secondary | ICD-10-CM | POA: Diagnosis not present

## 2012-03-27 DIAGNOSIS — Z7982 Long term (current) use of aspirin: Secondary | ICD-10-CM | POA: Insufficient documentation

## 2012-03-27 DIAGNOSIS — N83209 Unspecified ovarian cyst, unspecified side: Secondary | ICD-10-CM | POA: Diagnosis not present

## 2012-03-27 DIAGNOSIS — R05 Cough: Secondary | ICD-10-CM | POA: Diagnosis not present

## 2012-03-27 DIAGNOSIS — R112 Nausea with vomiting, unspecified: Secondary | ICD-10-CM | POA: Diagnosis not present

## 2012-03-27 LAB — COMPREHENSIVE METABOLIC PANEL
AST: 37 U/L (ref 0–37)
Albumin: 4.3 g/dL (ref 3.5–5.2)
Calcium: 10.3 mg/dL (ref 8.4–10.5)
Chloride: 95 mEq/L — ABNORMAL LOW (ref 96–112)
Creatinine, Ser: 0.64 mg/dL (ref 0.50–1.10)
Sodium: 135 mEq/L (ref 135–145)

## 2012-03-27 LAB — URINE MICROSCOPIC-ADD ON

## 2012-03-27 LAB — URINALYSIS, ROUTINE W REFLEX MICROSCOPIC
Bilirubin Urine: NEGATIVE
Glucose, UA: >1000 mg/dL — AB
Hgb urine dipstick: NEGATIVE
Ketones, ur: NEGATIVE mg/dL
Nitrite: NEGATIVE
Specific Gravity, Urine: >1.03 — ABNORMAL HIGH (ref 1.005–1.030)
pH: 5.5 (ref 5.0–8.0)

## 2012-03-27 LAB — CBC WITH DIFFERENTIAL/PLATELET
Basophils Absolute: 0 10*3/uL (ref 0.0–0.1)
Basophils Relative: 0 % (ref 0–1)
Eosinophils Relative: 1 % (ref 0–5)
HCT: 49 % — ABNORMAL HIGH (ref 36.0–46.0)
MCHC: 34.5 g/dL (ref 30.0–36.0)
Monocytes Absolute: 0.5 10*3/uL (ref 0.1–1.0)
Neutro Abs: 4.2 10*3/uL (ref 1.7–7.7)
Platelets: 215 10*3/uL (ref 150–400)
RDW: 12.8 % (ref 11.5–15.5)

## 2012-03-27 MED ORDER — ONDANSETRON HCL 4 MG PO TABS: 4.0000 mg | ORAL_TABLET | Freq: Three times a day (TID) | ORAL | Status: DC | PRN

## 2012-03-27 MED ORDER — FENTANYL CITRATE 0.05 MG/ML IJ SOLN
100.0000 ug | INTRAMUSCULAR | Status: AC | PRN
  Administered 2012-03-27 (×2): 100 ug via INTRAVENOUS
  Filled 2012-03-27 (×2): qty 2

## 2012-03-27 MED ORDER — ONDANSETRON HCL 4 MG/2ML IJ SOLN
4.0000 mg | INTRAMUSCULAR | Status: DC | PRN
  Administered 2012-03-27: 4 mg via INTRAVENOUS
  Filled 2012-03-27: qty 2

## 2012-03-27 MED ORDER — OXYCODONE-ACETAMINOPHEN 5-325 MG PO TABS: ORAL_TABLET | ORAL | Status: DC

## 2012-03-27 MED ORDER — IOHEXOL 300 MG/ML  SOLN
100.0000 mL | Freq: Once | INTRAMUSCULAR | Status: AC | PRN
  Administered 2012-03-27: 100 mL via INTRAVENOUS

## 2012-03-27 MED ORDER — SODIUM CHLORIDE 0.9 % IV SOLN
INTRAVENOUS | Status: DC
  Administered 2012-03-27: 16:00:00 via INTRAVENOUS

## 2012-03-27 NOTE — ED Notes (Signed)
abd pain, burning,  Constipation.  Has been seen here for same.
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
## 2012-03-27 NOTE — ED Notes (Signed)
Pt ambulated with minimal assistance to restroom.

## 2012-03-27 NOTE — ED Notes (Signed)
Pt states pain 9/10 in abdomen, given per PRN order. Last given 1605

## 2012-03-27 NOTE — ED Notes (Signed)
Dr. Clarene Duke aware pt has positive hemocult.

## 2012-03-27 NOTE — ED Notes (Signed)
Pt complains of generalized abd pain for several days, notes no bowel movement in close to a week. No other complaints at this time.

## 2012-03-28 LAB — OCCULT BLOOD, POC DEVICE: Fecal Occult Bld: POSITIVE

## 2012-03-29 ENCOUNTER — Other Ambulatory Visit: Payer: Self-pay

## 2012-03-29 ENCOUNTER — Observation Stay (HOSPITAL_COMMUNITY)
Admission: EM | Admit: 2012-03-29 | Discharge: 2012-03-30 | Disposition: A | Discharge status: Home / Self Care | Payer: Medicare Other | Attending: Internal Medicine | Admitting: Internal Medicine

## 2012-03-29 ENCOUNTER — Emergency Department (HOSPITAL_COMMUNITY): Payer: Medicare Other

## 2012-03-29 ENCOUNTER — Encounter (HOSPITAL_COMMUNITY): Payer: Self-pay | Admitting: *Deleted

## 2012-03-29 DIAGNOSIS — R110 Nausea: Secondary | ICD-10-CM | POA: Diagnosis not present

## 2012-03-29 DIAGNOSIS — R109 Unspecified abdominal pain: Secondary | ICD-10-CM | POA: Diagnosis not present

## 2012-03-29 DIAGNOSIS — E119 Type 2 diabetes mellitus without complications: Secondary | ICD-10-CM | POA: Diagnosis not present

## 2012-03-29 DIAGNOSIS — I10 Essential (primary) hypertension: Secondary | ICD-10-CM | POA: Diagnosis not present

## 2012-03-29 DIAGNOSIS — G8921 Chronic pain due to trauma: Secondary | ICD-10-CM

## 2012-03-29 DIAGNOSIS — I2510 Atherosclerotic heart disease of native coronary artery without angina pectoris: Secondary | ICD-10-CM

## 2012-03-29 DIAGNOSIS — R079 Chest pain, unspecified: Secondary | ICD-10-CM | POA: Insufficient documentation

## 2012-03-29 DIAGNOSIS — G8929 Other chronic pain: Secondary | ICD-10-CM | POA: Insufficient documentation

## 2012-03-29 DIAGNOSIS — K5900 Constipation, unspecified: Principal | ICD-10-CM | POA: Insufficient documentation

## 2012-03-29 DIAGNOSIS — E785 Hyperlipidemia, unspecified: Secondary | ICD-10-CM | POA: Diagnosis not present

## 2012-03-29 DIAGNOSIS — E1140 Type 2 diabetes mellitus with diabetic neuropathy, unspecified: Secondary | ICD-10-CM | POA: Diagnosis present

## 2012-03-29 DIAGNOSIS — E1165 Type 2 diabetes mellitus with hyperglycemia: Secondary | ICD-10-CM | POA: Diagnosis present

## 2012-03-29 LAB — BASIC METABOLIC PANEL
BUN: 11 mg/dL (ref 6–23)
CO2: 30 mEq/L (ref 19–32)
Calcium: 9.9 mg/dL (ref 8.4–10.5)
Chloride: 94 mEq/L — ABNORMAL LOW (ref 96–112)
Creatinine, Ser: 0.56 mg/dL (ref 0.50–1.10)
GFR calc Af Amer: >90 mL/min (ref >90)
GFR calc non Af Amer: >90 mL/min (ref >90)
Glucose, Bld: 309 mg/dL — ABNORMAL HIGH (ref 70–99)
Potassium: 3.4 mEq/L — ABNORMAL LOW (ref 3.5–5.1)
Sodium: 135 mEq/L (ref 135–145)

## 2012-03-29 LAB — CBC WITH DIFFERENTIAL/PLATELET
Basophils Absolute: 0 10*3/uL (ref 0.0–0.1)
Basophils Relative: 0 % (ref 0–1)
MCHC: 34.5 g/dL (ref 30.0–36.0)
Neutro Abs: 3.3 10*3/uL (ref 1.7–7.7)
Neutrophils Relative %: 51 % (ref 43–77)
Platelets: 223 10*3/uL (ref 150–400)
RDW: 13.1 % (ref 11.5–15.5)

## 2012-03-29 LAB — POCT I-STAT TROPONIN I: Troponin i, poc: 0 ng/mL (ref 0.00–0.08)

## 2012-03-29 LAB — GLUCOSE, CAPILLARY
Glucose-Capillary: 260 mg/dL — ABNORMAL HIGH (ref 70–99)
Glucose-Capillary: 277 mg/dL — ABNORMAL HIGH (ref 70–99)

## 2012-03-29 MED ORDER — METHYLNALTREXONE BROMIDE 12 MG/0.6ML ~~LOC~~ SOLN
8.0000 mg | Freq: Once | SUBCUTANEOUS | Status: DC
  Filled 2012-03-29: qty 0.6

## 2012-03-29 MED ORDER — MILK AND MOLASSES ENEMA
RECTAL | Status: AC
  Administered 2012-03-29: 16:00:00 via RECTAL

## 2012-03-29 MED ORDER — RAMIPRIL 10 MG PO CAPS
20.0000 mg | ORAL_CAPSULE | Freq: Every day | ORAL | Status: DC
  Administered 2012-03-29 – 2012-03-30 (×2): 20 mg via ORAL
  Filled 2012-03-29: qty 1
  Filled 2012-03-29 (×2): qty 2

## 2012-03-29 MED ORDER — ACETAMINOPHEN 325 MG PO TABS: 650.0000 mg | ORAL_TABLET | Freq: Four times a day (QID) | ORAL | Status: DC | PRN

## 2012-03-29 MED ORDER — TAPENTADOL HCL 100 MG PO TABS: 1.0000 | ORAL_TABLET | Freq: Four times a day (QID) | ORAL | Status: DC | PRN

## 2012-03-29 MED ORDER — ONDANSETRON HCL 4 MG PO TABS: 4.0000 mg | ORAL_TABLET | Freq: Four times a day (QID) | ORAL | Status: DC | PRN

## 2012-03-29 MED ORDER — MORPHINE SULFATE 2 MG/ML IJ SOLN
2.0000 mg | Freq: Once | INTRAMUSCULAR | Status: AC
  Administered 2012-03-29: 2 mg via INTRAVENOUS
  Filled 2012-03-29: qty 1

## 2012-03-29 MED ORDER — FLUOXETINE HCL 20 MG PO CAPS
20.0000 mg | ORAL_CAPSULE | Freq: Every day | ORAL | Status: DC
  Administered 2012-03-29 – 2012-03-30 (×2): 20 mg via ORAL
  Filled 2012-03-29 (×2): qty 1

## 2012-03-29 MED ORDER — AMITRIPTYLINE HCL 25 MG PO TABS
50.0000 mg | ORAL_TABLET | Freq: Every day | ORAL | Status: DC
  Administered 2012-03-29: 50 mg via ORAL
  Filled 2012-03-29: qty 2

## 2012-03-29 MED ORDER — OXYCODONE-ACETAMINOPHEN 5-325 MG PO TABS
1.0000 | ORAL_TABLET | Freq: Four times a day (QID) | ORAL | Status: DC | PRN
  Administered 2012-03-30: 2 via ORAL
  Filled 2012-03-29: qty 2

## 2012-03-29 MED ORDER — ENOXAPARIN SODIUM 40 MG/0.4ML ~~LOC~~ SOLN
40.0000 mg | Freq: Every day | SUBCUTANEOUS | Status: DC
  Administered 2012-03-29 – 2012-03-30 (×2): 40 mg via SUBCUTANEOUS
  Filled 2012-03-29 (×2): qty 0.4

## 2012-03-29 MED ORDER — ONDANSETRON HCL 4 MG/2ML IJ SOLN
4.0000 mg | Freq: Four times a day (QID) | INTRAMUSCULAR | Status: DC | PRN
  Administered 2012-03-29: 4 mg via INTRAVENOUS
  Filled 2012-03-29: qty 2

## 2012-03-29 MED ORDER — AMLODIPINE BESYLATE 5 MG PO TABS
5.0000 mg | ORAL_TABLET | Freq: Every day | ORAL | Status: DC
  Administered 2012-03-29 – 2012-03-30 (×2): 5 mg via ORAL
  Filled 2012-03-29 (×2): qty 1

## 2012-03-29 MED ORDER — METHADONE HCL 10 MG PO TABS
10.0000 mg | ORAL_TABLET | Freq: Two times a day (BID) | ORAL | Status: DC
  Administered 2012-03-29 – 2012-03-30 (×3): 10 mg via ORAL
  Filled 2012-03-29 (×3): qty 1

## 2012-03-29 MED ORDER — ACETAMINOPHEN 650 MG RE SUPP: 650.0000 mg | Freq: Four times a day (QID) | RECTAL | Status: DC | PRN

## 2012-03-29 MED ORDER — NITROGLYCERIN 0.4 MG SL SUBL: 0.4000 mg | SUBLINGUAL_TABLET | SUBLINGUAL | Status: DC | PRN

## 2012-03-29 MED ORDER — SODIUM CHLORIDE 0.9 % IV SOLN: INTRAVENOUS | Status: DC

## 2012-03-29 MED ORDER — ASPIRIN EC 81 MG PO TBEC
81.0000 mg | DELAYED_RELEASE_TABLET | Freq: Every day | ORAL | Status: DC
  Administered 2012-03-29 – 2012-03-30 (×2): 81 mg via ORAL
  Filled 2012-03-29 (×2): qty 1

## 2012-03-29 MED ORDER — METOPROLOL TARTRATE 50 MG PO TABS
100.0000 mg | ORAL_TABLET | Freq: Two times a day (BID) | ORAL | Status: DC
Start: 2012-03-29 — End: 2012-03-30
  Administered 2012-03-29 – 2012-03-30 (×3): 100 mg via ORAL
  Filled 2012-03-29: qty 2
  Filled 2012-03-29: qty 1
  Filled 2012-03-29 (×2): qty 2

## 2012-03-29 MED ORDER — INSULIN ASPART 100 UNIT/ML ~~LOC~~ SOLN
0.0000 [IU] | Freq: Every day | SUBCUTANEOUS | Status: DC
  Administered 2012-03-29: 3 [IU] via SUBCUTANEOUS

## 2012-03-29 MED ORDER — POLYETHYLENE GLYCOL 3350 17 G PO PACK
17.0000 g | PACK | ORAL | Status: DC
  Administered 2012-03-29 – 2012-03-30 (×5): 17 g via ORAL
  Filled 2012-03-29 (×4): qty 1
  Filled 2012-03-29: qty 2
  Filled 2012-03-29: qty 1

## 2012-03-29 MED ORDER — ALPRAZOLAM 1 MG PO TABS
1.0000 mg | ORAL_TABLET | Freq: Two times a day (BID) | ORAL | Status: DC
  Administered 2012-03-29 – 2012-03-30 (×3): 1 mg via ORAL
  Filled 2012-03-29 (×3): qty 1

## 2012-03-29 MED ORDER — NITROGLYCERIN 0.4 MG SL SUBL
0.4000 mg | SUBLINGUAL_TABLET | Freq: Once | SUBLINGUAL | Status: AC
  Administered 2012-03-29: 0.4 mg via SUBLINGUAL
  Filled 2012-03-29: qty 25

## 2012-03-29 MED ORDER — INSULIN ASPART 100 UNIT/ML ~~LOC~~ SOLN
0.0000 [IU] | Freq: Three times a day (TID) | SUBCUTANEOUS | Status: DC
  Administered 2012-03-29: 3 [IU] via SUBCUTANEOUS
  Administered 2012-03-29 – 2012-03-30 (×3): 8 [IU] via SUBCUTANEOUS

## 2012-03-29 MED ORDER — PROMETHAZINE HCL 25 MG/ML IJ SOLN: 12.5000 mg | Freq: Four times a day (QID) | INTRAMUSCULAR | Status: DC | PRN

## 2012-03-29 MED ORDER — LINAGLIPTIN 5 MG PO TABS
5.0000 mg | ORAL_TABLET | Freq: Every day | ORAL | Status: DC
  Administered 2012-03-29 – 2012-03-30 (×2): 5 mg via ORAL
  Filled 2012-03-29 (×2): qty 1

## 2012-03-29 MED ORDER — POTASSIUM CHLORIDE IN NACL 40-0.9 MEQ/L-% IV SOLN
INTRAVENOUS | Status: DC
  Administered 2012-03-29 – 2012-03-30 (×2): via INTRAVENOUS

## 2012-03-29 NOTE — ED Notes (Signed)
Pt states pain has greatly decreased, now says pain is more of a "heavyness" and is down to a 6/10 from a 10/10 when she came in .

## 2012-03-29 NOTE — Plan of Care (Signed)
Problem: Phase II Progression Outcomes Goal: Progress activity as tolerated unless otherwise ordered Outcome: Progressing Pt ambulating to bathroom and in room.  Tolerated well.
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
## 2012-03-29 NOTE — ED Notes (Signed)
Pt states chest pain that started just 15 min PTA. Pt took 2 nitro at home and 2 81mg  asa. States chest pain is central and stabbing, no radiation of pain noted. Pt tachycardic on monitor at 108. Pt also states BP has been elevated over last several days.

## 2012-03-29 NOTE — Progress Notes (Signed)
Inpatient Diabetes Program Recommendations  AACE/ADA: New Consensus Statement on Inpatient Glycemic Control (2013)  Target Ranges:  Prepandial:   less than 140 mg/dL      Peak postprandial:   less than 180 mg/dL (1-2 hours)      Critically ill patients:  140 - 180 mg/dL   Results for Anita [name], Anita [name] (MRN [id number]) as of 03/29/2012 12:13  Ref. Range 03/27/2012 15:55 03/29/2012 04:00  Glucose Latest Range: 70-99 mg/dL 782 (H) 956 (H)   Results for Anita [name], Anita [name] (MRN [id number]) as of 03/29/2012 12:13  Ref. Range 03/29/2012 11:13  Glucose-Capillary Latest Range: 70-99 mg/dL 469 (H)    Inpatient Diabetes Program Recommendations Insulin - Basal: Please consider starting Lantus 15 units daily since CBGs ranging between 260-316 mg/dl over the last 12 hours.  Note: Will continue to follow. Thanks, Orlando Penner, RN, BSN, CCRN Diabetes Coordinator Inpatient Diabetes Program [phone]

## 2012-03-29 NOTE — Plan of Care (Signed)
Problem: Phase I Progression Outcomes Goal: Initial discharge plan identified Outcome: Completed/Met Date Met:  03/29/12 Plans to return home with husband at discharge.     
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
## 2012-03-29 NOTE — ED Notes (Addendum)
Pt reports sharp stabbing chest pain that started about 15 min ago. Pt took 1 nitro & 2 81mg  ASA at home w/ no relief. Pt states blood pressure was up also.

## 2012-03-29 NOTE — Progress Notes (Signed)
UR Chart Review Completed  

## 2012-03-29 NOTE — ED Provider Notes (Signed)
History     CSN: [id number]  Arrival date & time 03/29/12  0403   First MD Initiated Contact with Patient 03/29/12 0501      Chief Complaint  Patient presents with  . Chest Pain  . Hypertension    (Consider location/radiation/quality/duration/timing/severity/associated sxs/prior treatment) HPI Natalea L Markwood is a 51 y.o. female who presents to the Emergency Department complaining of chest pain and severe constipation. Patient has not had a bowel movement in over a week. She has been seen x 2 in the ER for abdominal pain and constipation. She took two laxatives prior to going to bed last night. Woke to go to the bathroom. While sitting on the toilet, developed severe sharp chest pain. Pain was associated with nausea, no vomiting. She had no bowel movement and passed a lot of flatus. Husband gave her two baby aspirin and SL ntg x 1 with no relief. Had additional SL ntg when arrived in the ER with no relief. Pain is improving with passage of flatus. Denies fever, chills, nausea, vomiting  .PCP Dr. Sherwood Gambler   Past Medical History  Diagnosis Date  . Nerve damage     to neck.  . Pain management   . Hypertension   . Diabetes mellitus   . Peripheral neuropathy   . Chronic neck pain   . Migraine headache   . Chronic nausea   . Incomplete RBBB   . Chronic abdominal pain   . History of cardiac catheterization 02/15/11 Dr. Nicki [place]    Past Surgical History  Procedure Date  . Leg surgery   . Asd repair   . Neck surgery   . Abdominal hysterectomy   . Cardiac catheterization     No family history on file.  History  Substance Use Topics  . Smoking status: Current Some Day Smoker    Types: Cigarettes  . Smokeless tobacco: Not on file  . Alcohol Use: No    OB History    Grav Para Term Preterm Abortions TAB SAB Ect Mult Living                  Review of Systems  Constitutional: Negative for fever.       10 Systems reviewed and are negative for acute change except as noted  in the HPI.  HENT: Negative for congestion.   Eyes: Negative for discharge and redness.  Respiratory: Negative for cough and shortness of breath.   Cardiovascular: Positive for chest pain.  Gastrointestinal: Positive for constipation and abdominal distention. Negative for vomiting and abdominal pain.  Musculoskeletal: Negative for back pain.  Skin: Negative for rash.  Neurological: Negative for syncope, numbness and headaches.  Psychiatric/Behavioral:       No behavior change.    Allergies  Imitrex and Penicillins  Home Medications   Current Outpatient Rx  Name Route Sig Dispense Refill  . ALPRAZOLAM 1 MG PO TABS Oral Take 1 mg by mouth 2 (two) times daily.    Marland Kitchen AMITRIPTYLINE HCL 50 MG PO TABS Oral Take 50 mg by mouth at bedtime.    Marland Kitchen AMLODIPINE BESYLATE 5 MG PO TABS Oral Take 5 mg by mouth daily.    . ASPIRIN 81 MG PO TABS Oral Take 81 mg by mouth daily.    Marland Kitchen FLUOXETINE HCL 20 MG PO CAPS Oral Take 20 mg by mouth daily.    Marland Kitchen METHADONE HCL 10 MG PO TABS Oral Take 10 mg by mouth every 12 (twelve) hours.    Marland Kitchen  NITROGLYCERIN 0.4 MG SL SUBL Sublingual Place 0.4 mg under the tongue every 5 (five) minutes as needed. For chest pain    . ONDANSETRON HCL 4 MG PO TABS Oral Take 1 tablet (4 mg total) by mouth every 8 (eight) hours as needed for nausea. 6 tablet 0  . OXYCODONE-ACETAMINOPHEN 5-325 MG PO TABS  1 or 2 tabs PO q6h prn pain 20 tablet 0  . PROMETHAZINE HCL 25 MG RE SUPP Rectal Place 1 suppository (25 mg total) rectally every 6 (six) hours as needed for nausea. 12 each 0  . PROMETHAZINE HCL 25 MG PO TABS Oral Take 1 tablet (25 mg total) by mouth every 8 (eight) hours as needed for nausea. 15 tablet 0  . RAMIPRIL 10 MG PO CAPS Oral Take 20 mg by mouth daily.    Marland Kitchen SAXAGLIPTIN HCL 5 MG PO TABS Oral Take 5 mg by mouth daily.    Marland Kitchen TAPENTADOL HCL 100 MG PO TABS Oral Take 1 tablet by mouth every 6 (six) hours as needed. For pain    . METOPROLOL TARTRATE 100 MG PO TABS Oral Take 100 mg by mouth  2 (two) times daily.      BP 191/86  Pulse 86  Temp 98.1 F (36.7 C) (Oral)  Resp 15  Ht 5\' 5"  (1.651 m)  Wt 154 lb (69.854 kg)  BMI 25.63 kg/m2  SpO2 98%  Physical Exam  Nursing note and vitals reviewed. Constitutional: She is oriented to person, place, and time. She appears well-developed and well-nourished.       Awake, alert, nontoxic appearance.  HENT:  Head: Atraumatic.  Eyes: Right eye exhibits no discharge. Left eye exhibits no discharge.  Neck: Neck supple.  Cardiovascular: Normal heart sounds.   Pulmonary/Chest: Effort normal and breath sounds normal. She exhibits no tenderness.  Abdominal: Soft. She exhibits distension. There is no tenderness. There is no rebound and no guarding.  Genitourinary:       Rectal exam performed. Stool not palpable in rectal vault.  Musculoskeletal: Normal range of motion. She exhibits no tenderness.       Baseline ROM, no obvious new focal weakness.  Neurological: She is alert and oriented to person, place, and time.       Mental status and motor strength appears baseline for patient and situation.  Skin: No rash noted.  Psychiatric: She has a normal mood and affect.    ED Course  Procedures (including critical care time)  Results for orders placed during the hospital encounter of 03/29/12  CBC WITH DIFFERENTIAL      Component Value Range   WBC 6.5  4.0 - 10.5 K/uL   RBC 5.82 (*) 3.87 - 5.11 MIL/uL   Hemoglobin 16.3 (*) 12.0 - 15.0 g/dL   HCT 16.1 (*) 09.6 - 04.5 %   MCV 81.1  78.0 - 100.0 fL   MCH 28.0  26.0 - 34.0 pg   MCHC 34.5  30.0 - 36.0 g/dL   RDW 40.9  81.1 - 91.4 %   Platelets 223  150 - 400 K/uL   Neutrophils Relative 51  43 - 77 %   Neutro Abs 3.3  1.7 - 7.7 K/uL   Lymphocytes Relative 36  12 - 46 %   Lymphs Abs 2.3  0.7 - 4.0 K/uL   Monocytes Relative 11  3 - 12 %   Monocytes Absolute 0.7  0.1 - 1.0 K/uL   Eosinophils Relative 2  0 - 5 %   Eosinophils  Absolute 0.2  0.0 - 0.7 K/uL   Basophils Relative 0  0 - 1  %   Basophils Absolute 0.0  0.0 - 0.1 K/uL  BASIC METABOLIC PANEL      Component Value Range   Sodium 135  135 - 145 mEq/L   Potassium 3.4 (*) 3.5 - 5.1 mEq/L   Chloride 94 (*) 96 - 112 mEq/L   CO2 30  19 - 32 mEq/L   Glucose, Bld 309 (*) 70 - 99 mg/dL   BUN 11  6 - 23 mg/dL   Creatinine, Ser 1.61  0.50 - 1.10 mg/dL   Calcium 9.9  8.4 - 09.6 mg/dL   GFR calc non Af Amer >90  >90 mL/min   GFR calc Af Amer >90  >90 mL/min  POCT I-STAT TROPONIN I      Component Value Range   Troponin i, poc 0.00  0.00 - 0.08 ng/mL   Comment 3            Dg Chest 2 View  03/27/2012  *RADIOLOGY REPORT*  Clinical Data: Cough, weakness, nausea, smoking history  CHEST - 2 VIEW  Comparison: Chest x-ray of 03/22/2012 and 02/12/2011  Findings: No active infiltrate or effusion is seen.  The heart is borderline enlarged and stable.  Median sternotomy sutures are noted.  No bony abnormality is seen.  IMPRESSION: Stable chest x-ray with borderline cardiomegaly.  No active lung disease.   Original Report Authenticated By: Juline Patch, M.D.    Ct Abdomen Pelvis W Contrast  03/27/2012  *RADIOLOGY REPORT*  Clinical Data: Diffuse abdominal pain.  Nausea vomiting.  Heme positive stool.  CT ABDOMEN AND PELVIS WITH CONTRAST  Technique:  Multidetector CT imaging of the abdomen and pelvis was performed following the standard protocol during bolus administration of intravenous contrast.  Contrast:  100 ml Omnipaque-300 and oral contrast  Comparison: None.  Findings: Mild hepatic steatosis is demonstrated, but no liver lesions are identified.  The gallbladder and pancreas are normal in appearance.  The spleen, adrenal glands, and right kidney are normal in appearance.  A tiny right lower pole renal cyst is noted but there is no evidence of renal masses or hydronephrosis.  Prior hysterectomy is noted.  A cystic lesion is seen within the left adnexa measuring 4.3 x 6.0 cm which shows some heterogeneous attenuation, but without  definite solid enhancing component.  This has indeterminate but probably benign characteristics.  No other pelvic masses are identified.  No evidence of lymphadenopathy.  No evidence of inflammatory process or free fluid.  No evidence of bowel wall thickening or dilatation.  Normal appendix is visualized.  No hernia demonstrated.  Large colonic stool burden noted.  IMPRESSION:  1.  6 cm cystic lesion in the left adnexa, with indeterminate but probably benign characteristics.  Recommend further evaluation with pelvic ultrasound in 6-12 weeks, or alternatively pelvic MRI without and with contrast could be performed for further characterization. 2.  Large stool burden noted; suggest clinical correlation for possible constipation. 3.  Mild hepatic steatosis.   Original Report Authenticated By: Danae Orleans, M.D.    Dg Chest Portable 1 View  03/29/2012  *RADIOLOGY REPORT*  Clinical Data: Chest pain.  PORTABLE CHEST - 1 VIEW  Comparison: PA and lateral chest 03/27/2012.  Findings: The patient is status post median sternotomy.  Lung volumes are low but the lungs are clear.  Heart size is normal.  No pneumothorax or pleural fluid.  IMPRESSION: No acute disease.  Original Report Authenticated By: Bernadene Bell. Maricela Curet, M.D.     Date: 03/29/2012   0402  Rate: 135  Rhythm: sinus tachycardia  QRS Axis: normal  Intervals: QT prolonged  ST/T Wave abnormalities: normal  Conduction Disutrbances:right bundle branch block  Narrative Interpretation:   Old EKG Reviewed: changes noted rate faster, longer QTc   #2  Date: 03/29/2012   0629  Rate: 78  Rhythm: normal sinus rhythm  QRS Axis: normal  Intervals: normal  ST/T Wave abnormalities: normal  Conduction Disutrbances:none and incomplete right bundle branch block  Narrative Interpretation:   Old EKG Reviewed: changes noted rate slower, QTc corrected,    5:59 AM:  T/C to  [place], case discussed, including:  HPI, pertinent PM/SHx, VS/PE, dx testing, ED  course and treatment.  Agreeable to admission.  Requests to write temporary orders, med surg bed to team 1   MDM  Patient with constipation > 1 week who developed sharp stabbing abdominal and chest pain earlier. Pain improved with passage of flatus. EKG originally with prolonged QTc which was not evident on subsequent EKG. Troponin negative x 2. Review of CT scan done 03/27/12 with large stool burden. Unable to palpate stool in the rectal vault. Spoke with Dr. Phillips Odor, hospitalist who will admit the patient for treatment of the constipation.Pt stable in ED with no significant deterioration in condition.The patient appears reasonably stabilized for admission considering the current resources, flow, and capabilities available in the ED at this time, and I doubt any other [facility] requiring further screening and/or treatment in the ED prior to admission.  MDM Reviewed: nursing note and vitals Interpretation: labs, ECG and x-ray           Nicoletta Dress. Colon Branch, MD 03/29/12 [phone]

## 2012-03-29 NOTE — ED Notes (Signed)
Report given to Amanda, RN unit 300. Ready to receive patient.  

## 2012-03-30 DIAGNOSIS — K5900 Constipation, unspecified: Secondary | ICD-10-CM | POA: Diagnosis not present

## 2012-03-30 DIAGNOSIS — E119 Type 2 diabetes mellitus without complications: Secondary | ICD-10-CM | POA: Diagnosis not present

## 2012-03-30 DIAGNOSIS — R109 Unspecified abdominal pain: Secondary | ICD-10-CM | POA: Diagnosis not present

## 2012-03-30 DIAGNOSIS — I2510 Atherosclerotic heart disease of native coronary artery without angina pectoris: Secondary | ICD-10-CM

## 2012-03-30 LAB — GLUCOSE, CAPILLARY: Glucose-Capillary: 258 mg/dL — ABNORMAL HIGH (ref 70–99)

## 2012-03-30 LAB — URINE CULTURE

## 2012-03-30 LAB — TSH: TSH: 8.063 u[IU]/mL — ABNORMAL HIGH (ref 0.350–4.500)

## 2012-03-30 MED ORDER — POLYETHYLENE GLYCOL 3350 17 G PO PACK: 17.0000 g | PACK | Freq: Every day | ORAL | Status: DC

## 2012-03-30 MED ORDER — METFORMIN HCL 500 MG PO TABS: 500.0000 mg | ORAL_TABLET | Freq: Two times a day (BID) | ORAL | Status: DC

## 2012-03-30 NOTE — Progress Notes (Signed)
Pharmacy requested that patient provide the Nucynta from home as this medication is non-formulary.  Husband brought the Nucynta from home.  Pt stated she took tablet when her husband brought it in.  States that she three the tablet up.  States that po pain medication will not help with her headache.  Requesting IV pain medication.  Dr. Kerry Hough notified.  One time dose of Morphine 2 mg ordered.  Orders followed.  PRN Zofran administered.  Nucynta tablets were verified by 2 RNs.  Form completed.  Medication and form given to [facility].

## 2012-03-30 NOTE — Progress Notes (Signed)
Pt and pt's spouse provided with discharge instructions.  Pt and pt's spouse verbalized understanding of d/c instructions through teach back.  Pt and pt's spouse provided with Nucynta medication that had been taken to the pharmacy for storage.

## 2012-03-30 NOTE — H&P (Signed)
[facility] History and Physical  Anita [name] MRN:[id number] DOB: 03/06/1961 DOA: 03/29/2012  Referring physician: Dr. Strand PCP: FUSCO,LAWRENCE J., MD  Specialists: Pain management: Dr. Phillips in [place]  Chief Complaint: abd pain  HPI: Anita [name] is a 51 y.o. female who presents to the ED with complaints of abd pain.  She describes a diffuse abdominal pain which radiates up into her chest. She reports not having a stool in the past week.  She has been seen in the ED three times and has failed outpatient treatments.  She reports taking laxatives by mouth as well as taking 3 Fleets enemas daily.  She has had some nausea and vomiting, but reports that these are more chronic issues.  She has been able to keep down some food.  She has noted progressive abdominal distention.  She reports passing flatus last night. She denies any fever. She is on multiple opiates for chronic pain issues. Rectal exam was done in ED by EDP and it was noted that no stool could be felt in the rectal vault. Since she has failed outpatient treatment, she has been referred for admission for further care.    Review of Systems: Pertinent positives as per HPI, otherwise negative.  Past Medical History  Diagnosis Date  . Nerve damage     to neck.  . Pain management   . Hypertension   . Diabetes mellitus   . Peripheral neuropathy   . Chronic neck pain   . Migraine headache   . Chronic nausea   . Incomplete RBBB   . Chronic abdominal pain   . History of cardiac catheterization 02/15/11 Dr. Thomas Kelly   Past Surgical History  Procedure Date  . Leg surgery   . Asd repair   . Neck surgery   . Abdominal hysterectomy   . Cardiac catheterization    Social History:  reports that she has been smoking Cigarettes.  She does not have any smokeless tobacco history on file. She reports that she does not drink alcohol or use illicit drugs. Lives at home with her husband.  Allergies  Allergen Reactions  .  Imitrex (Sumatriptan) Swelling    States she had swelling in neck  . Penicillins Rash    Family History: mother and father both have hypertension and diabetes. Father has COPD. Mother has asthma.  Prior to Admission medications   Medication Sig Start Date End Date Taking? Authorizing Provider  ALPRAZolam (XANAX) 1 MG tablet Take 1 mg by mouth 2 (two) times daily.   Yes Historical Provider, MD  amitriptyline (ELAVIL) 50 MG tablet Take 50 mg by mouth at bedtime.   Yes Historical Provider, MD  amLODipine (NORVASC) 5 MG tablet Take 5 mg by mouth daily.   Yes Historical Provider, MD  aspirin 81 MG tablet Take 81 mg by mouth daily.   Yes Historical Provider, MD  FLUoxetine (PROZAC) 20 MG capsule Take 20 mg by mouth daily.   Yes Historical Provider, MD  methadone (DOLOPHINE) 10 MG tablet Take 10 mg by mouth every 12 (twelve) hours.   Yes Historical Provider, MD  metoprolol (LOPRESSOR) 100 MG tablet Take 100 mg by mouth 2 (two) times daily.   Yes Historical Provider, MD  nitroGLYCERIN (NITROSTAT) 0.4 MG SL tablet Place 0.4 mg under the tongue every 5 (five) minutes as needed. For chest pain   Yes Historical Provider, MD  ondansetron (ZOFRAN) 4 MG tablet Take 1 tablet (4 mg total) by mouth every 8 (eight) hours as   needed for nausea. 03/27/12  Yes Kathleen M McManus, DO  oxyCODONE-acetaminophen (PERCOCET/ROXICET) 5-325 MG per tablet Take 1-2 tablets by mouth every 6 (six) hours as needed. pain 03/27/12  Yes Kathleen M McManus, DO  promethazine (PHENERGAN) 25 MG suppository Place 1 suppository (25 mg total) rectally every 6 (six) hours as needed for nausea. 03/23/12  Yes Iva L Knapp, MD  promethazine (PHENERGAN) 25 MG tablet Take 1 tablet (25 mg total) by mouth every 8 (eight) hours as needed for nausea. 03/23/12  Yes Iva L Knapp, MD  ramipril (ALTACE) 10 MG capsule Take 20 mg by mouth daily.   Yes Historical Provider, MD  saxagliptin HCl (ONGLYZA) 5 MG TABS tablet Take 5 mg by mouth daily.   Yes  Historical Provider, MD  Tapentadol HCl (NUCYNTA) 100 MG TABS Take 1 tablet by mouth every 6 (six) hours as needed. For pain   Yes Historical Provider, MD   Physical Exam: Filed Vitals:   03/29/12 0415 03/29/12 0500 03/29/12 0600  BP: 180/97 191/86 164/72  Pulse: 109 86 81  Temp: 98.1 F (36.7 C)    TempSrc: Oral    Resp: 30 15 16  Height: 5' 5" (1.651 m)    Weight: 69.854 kg (154 lb)    SpO2: 98% 98% 96%     General:  NAD  Eyes: PERRLA  ENT: mucous membranes are moist  Neck: supple  Cardiovascular: s1, s2, rrr  Respiratory: cta b  Abdomen: soft, mild tenderness diffusely, distended, bs+  Skin: normal  Musculoskeletal: deferred  Psychiatric: normal affect, cooperative with exam  Neurologic: grossly intact, non focal  Labs on Admission:  Basic Metabolic Panel:  Lab 03/29/12 0400 03/27/12 1555 03/22/12 2106  NA 135 135 134*  K 3.4* 4.2 3.9  CL 94* 95* 94*  CO2 30 30 27  GLUCOSE 309* 316* 434*  BUN 11 10 7  CREATININE 0.56 0.64 0.55  CALCIUM 9.9 10.3 10.0  MG -- -- --  PHOS -- -- --   Liver Function Tests:  Lab 03/27/12 1555 03/22/12 2106  AST 37 34  ALT 47* 41*  ALKPHOS 111 128*  BILITOT 0.4 0.3  PROT 8.9* 8.3  ALBUMIN 4.3 4.0    Lab 03/27/12 1555 03/22/12 2106  LIPASE 48 62*  AMYLASE -- --   No results found for this basename: AMMONIA:5 in the last 168 hours CBC:  Lab 03/29/12 0400 03/27/12 1555 03/22/12 2106  WBC 6.5 6.4 5.7  NEUTROABS 3.3 4.2 3.6  HGB 16.3* 16.9* 16.2*  HCT 47.2* 49.0* 46.0  MCV 81.1 83.1 82.0  PLT 223 215 201   Cardiac Enzymes:  Lab 03/22/12 2106  CKTOTAL --  CKMB --  CKMBINDEX --  TROPONINI <0.30    BNP (last 3 results) No results found for this basename: PROBNP:3 in the last 8760 hours CBG: No results found for this basename: GLUCAP:5 in the last 168 hours  Radiological Exams on Admission: Dg Chest 2 View  03/27/2012  *RADIOLOGY REPORT*  Clinical Data: Cough, weakness, nausea, smoking history  CHEST  - 2 VIEW  Comparison: Chest x-ray of 03/22/2012 and 02/12/2011  Findings: No active infiltrate or effusion is seen.  The heart is borderline enlarged and stable.  Median sternotomy sutures are noted.  No bony abnormality is seen.  IMPRESSION: Stable chest x-ray with borderline cardiomegaly.  No active lung disease.   Original Report Authenticated By: PAUL D. BARRY, M.D.    Ct Abdomen Pelvis W Contrast  03/27/2012  *RADIOLOGY REPORT*    Clinical Data: Diffuse abdominal pain.  Nausea vomiting.  Heme positive stool.  CT ABDOMEN AND PELVIS WITH CONTRAST  Technique:  Multidetector CT imaging of the abdomen and pelvis was performed following the standard protocol during bolus administration of intravenous contrast.  Contrast:  100 ml Omnipaque-300 and oral contrast  Comparison: None.  Findings: Mild hepatic steatosis is demonstrated, but no liver lesions are identified.  The gallbladder and pancreas are normal in appearance.  The spleen, adrenal glands, and right kidney are normal in appearance.  A tiny right lower pole renal cyst is noted but there is no evidence of renal masses or hydronephrosis.  Prior hysterectomy is noted.  A cystic lesion is seen within the left adnexa measuring 4.3 x 6.0 cm which shows some heterogeneous attenuation, but without definite solid enhancing component.  This has indeterminate but probably benign characteristics.  No other pelvic masses are identified.  No evidence of lymphadenopathy.  No evidence of inflammatory process or free fluid.  No evidence of bowel wall thickening or dilatation.  Normal appendix is visualized.  No hernia demonstrated.  Large colonic stool burden noted.  IMPRESSION:  1.  6 cm cystic lesion in the left adnexa, with indeterminate but probably benign characteristics.  Recommend further evaluation with pelvic ultrasound in 6-12 weeks, or alternatively pelvic MRI without and with contrast could be performed for further characterization. 2.  Large stool burden noted;  suggest clinical correlation for possible constipation. 3.  Mild hepatic steatosis.   Original Report Authenticated By: JOHN A. STAHL, M.D.    Dg Chest Portable 1 View  03/29/2012  *RADIOLOGY REPORT*  Clinical Data: Chest pain.  PORTABLE CHEST - 1 VIEW  Comparison: PA and lateral chest 03/27/2012.  Findings: The patient is status post median sternotomy.  Lung volumes are low but the lungs are clear.  Heart size is normal.  No pneumothorax or pleural fluid.  IMPRESSION: No acute disease.   Original Report Authenticated By: THOMAS L. D'ALESSIO, M.D.     EKG: Independently reviewed. No acute ST-T changes  Assessment/Plan Principal Problem:  *Constipation Active Problems:  HTN (hypertension)  Diabetes mellitus  Dyslipidemia  Chronic pain due to trauma  Abdominal pain   1. Severe constipation.  Patient has received a dose of relistor in the ED.  She will be given milk and molasses enemas as well as q4hr Miralax.  She will be ambulated.  CT of abd showed large stool burden but no comment of obstruction. Managing constipation in this patient will be challenging in the long term due to her multiple opiates and relative inactivity.  She has been encouraged to consume a high fiber diet. 2. Diabetes. Check cbg ac and hs, SSI 3. HTN.  Continue outpatient medications 4. Dispo.  Once abd pain/constipation has improved,she can likely discharge home. Probable discharge in 24 hours  Code Status: full code Family Communication: discussed with patient at bedside Disposition Plan: probable discharge home in the morning  Time spent: 40mins  Sherwin Hollingshed [facility] Pager [phone]  If 7PM-7AM, please contact night-coverage [email or web address] Password [email or web address] 03/29/2012, 9:03 AM         milk and molasses enemas as well as q4hr Miralax. She will be ambulated. CT of abd showed large stool burden but no comment of obstruction. Managing constipation in this patient will be challenging in the long term due to her multiple opiates and relative inactivity. She has been encouraged to consume a high fiber diet. 2. Diabetes. Check cbg ac and hs, SSI 3. HTN. Continue outpatient medications 4. Dispo. Once abd pain/constipation has improved,she can likely discharge home. Probable discharge in 24 hours  Code Status: full code  Family Communication: discussed with patient at bedside  Disposition Plan: probable discharge home in the morning   Time spent:   Irelyn Perfecto  [facility]  Pager [phone]   If 7PM-7AM, please contact night-coverage  [email or web address]  Password [email or web address]  03/29/2012, 9:03 AM

## 2012-03-30 NOTE — Discharge Summary (Signed)
Physician Discharge Summary  Anita [name] XBJ:[id number] DOB: 1960/10/31 DOA: 03/29/2012  PCP: Cassell Smiles., MD  Admit date: 03/29/2012 Discharge date: 03/30/2012  Time spent: 25 minutes  Recommendations for Outpatient Follow-up:  1. Follow up with primary care doctor in 2 weeks.  Discharge Diagnoses:  Principal Problem:  *Constipation Active Problems:  HTN (hypertension)  Diabetes mellitus  Dyslipidemia  Chronic pain due to trauma  Abdominal pain   Discharge Condition: improved  Diet recommendation: low carb, low salt, high fiber  Filed Weights   03/29/12 0415 03/29/12 1025  Weight: 69.854 kg (154 lb) 70.4 kg (155 lb 3.3 oz)    History of present illness:  Anita [name] is a 51 y.o. female who presents to the ED with complaints of abd pain. She describes a diffuse abdominal pain which radiates up into her chest. She reports not having a stool in the past week. She has been seen in the ED three times and has failed outpatient treatments. She reports taking laxatives by mouth as well as taking 3 Fleets enemas daily. She has had some nausea and vomiting, but reports that these are more chronic issues. She has been able to keep down some food. She has noted progressive abdominal distention. She reports passing flatus last night. She denies any fever. She is on multiple opiates for chronic pain issues. Rectal exam was done in ED by EDP and it was noted that no stool could be felt in the rectal vault. Since she has failed outpatient treatment, she has been referred for admission for further care.    Hospital Course:  This lady was admitted with persistent abd pain, abd distention and constipation.  She had failed outpatient treatments and required inpatient admission.  She was given 2 milk and molasses enemas and started on an aggressive miralax regimen.  She has had multiple bowel movements and feels significantly better. Her abd pain and distention are now resolved.  She has been  advised to stay on miralax daily to prevent constipation, to ambulate, have a high fiber diet and keep herself hydrated.   Her blood sugars have been running high.  She is only on onglyza at home. We will add metformin to her regimen and defer further adjustments to her primary care doctor.  She is ready for discharge home.  Procedures:  none  Consultations:  none  Discharge Exam: Filed Vitals:   03/29/12 1440 03/29/12 1900 03/30/12 0526 03/30/12 1055  BP: 107/64 135/65 124/71 132/68  Pulse: 66 67 55 63  Temp: 98 F (36.7 C) 98.2 F (36.8 C) 97.7 F (36.5 C)   TempSrc:   Oral   Resp: 16 16 20    Height:      Weight:      SpO2: 95% 99% 96%     General: NAD Cardiovascular: s1, s2, rrr Respiratory: cta b  Discharge Instructions  Discharge Orders    Future Orders Please Complete By Expires   Diet - low sodium heart healthy      Comments:   High fiber   Diet Carb Modified      Increase activity slowly      Call MD for:  temperature >100.4      Call MD for:  persistant nausea and vomiting      Call MD for:  severe uncontrolled pain          Medication List     As of 03/30/2012 12:35 PM    TAKE these medications  ALPRAZolam 1 MG tablet   Commonly known as: XANAX   Take 1 mg by mouth 2 (two) times daily.      amitriptyline 50 MG tablet   Commonly known as: ELAVIL   Take 50 mg by mouth at bedtime.      amLODipine 5 MG tablet   Commonly known as: NORVASC   Take 5 mg by mouth daily.      aspirin 81 MG tablet   Take 81 mg by mouth daily.      FLUoxetine 20 MG capsule   Commonly known as: PROZAC   Take 20 mg by mouth daily.      metFORMIN 500 MG tablet   Commonly known as: GLUCOPHAGE   Take 1 tablet (500 mg total) by mouth 2 (two) times daily with a meal.      methadone 10 MG tablet   Commonly known as: DOLOPHINE   Take 10 mg by mouth every 12 (twelve) hours.      metoprolol 100 MG tablet   Commonly known as: LOPRESSOR   Take 100 mg by  mouth 2 (two) times daily.      nitroGLYCERIN 0.4 MG SL tablet   Commonly known as: NITROSTAT   Place 0.4 mg under the tongue every 5 (five) minutes as needed. For chest pain      NUCYNTA 100 MG Tabs   Generic drug: Tapentadol HCl   Take 1 tablet by mouth every 6 (six) hours as needed. For pain      ondansetron 4 MG tablet   Commonly known as: ZOFRAN   Take 1 tablet (4 mg total) by mouth every 8 (eight) hours as needed for nausea.      ONGLYZA 5 MG Tabs tablet   Generic drug: saxagliptin HCl   Take 5 mg by mouth daily.      oxyCODONE-acetaminophen 5-325 MG per tablet   Commonly known as: PERCOCET/ROXICET   Take 1-2 tablets by mouth every 6 (six) hours as needed. pain      polyethylene glycol packet   Commonly known as: MIRALAX / GLYCOLAX   Take 17 g by mouth daily.      promethazine 25 MG suppository   Commonly known as: PHENERGAN   Place 1 suppository (25 mg total) rectally every 6 (six) hours as needed for nausea.      promethazine 25 MG tablet   Commonly known as: PHENERGAN   Take 1 tablet (25 mg total) by mouth every 8 (eight) hours as needed for nausea.      ramipril 10 MG capsule   Commonly known as: ALTACE   Take 20 mg by mouth daily.           Follow-up Information    Follow up with Cassell Smiles., MD. Schedule an appointment as soon as possible for a visit in 2 weeks.   Contact information:   [address] [place] [address] [phone]           The results of significant diagnostics from this hospitalization (including imaging, microbiology, ancillary and laboratory) are listed below for reference.    Significant Diagnostic Studies: Dg Chest 2 View  03/27/2012  *RADIOLOGY REPORT*  Clinical Data: Cough, weakness, nausea, smoking history  CHEST - 2 VIEW  Comparison: Chest x-ray of 03/22/2012 and 02/12/2011  Findings: No active infiltrate or effusion is seen.  The heart is borderline enlarged and stable.  Median sternotomy  sutures are noted.  No bony abnormality is seen.  IMPRESSION: Stable chest x-ray with borderline cardiomegaly.  No active lung disease.   Original Report Authenticated By: Juline Patch, M.D.    Dg Chest 2 View  03/08/2012  *RADIOLOGY REPORT*  Clinical Data: Chest pain.  Short of breath.  Palpitations.  CHEST - 2 VIEW  Comparison: 02/12/2011 and previous  Findings: There has been previous median sternotomy.  Heart size is normal.  Upper lungs are clear.  There may be mild atelectasis at the lung bases.  No edema or effusions.  No acute bony finding.  IMPRESSION: Patchy atelectasis at both lung bases.  Otherwise no active disease.   Original Report Authenticated By: Thomasenia Sales, M.D.    Ct Abdomen Pelvis W Contrast  03/27/2012  *RADIOLOGY REPORT*  Clinical Data: Diffuse abdominal pain.  Nausea vomiting.  Heme positive stool.  CT ABDOMEN AND PELVIS WITH CONTRAST  Technique:  Multidetector CT imaging of the abdomen and pelvis was performed following the standard protocol during bolus administration of intravenous contrast.  Contrast:  100 ml Omnipaque-300 and oral contrast  Comparison: None.  Findings: Mild hepatic steatosis is demonstrated, but no liver lesions are identified.  The gallbladder and pancreas are normal in appearance.  The spleen, adrenal glands, and right kidney are normal in appearance.  A tiny right lower pole renal cyst is noted but there is no evidence of renal masses or hydronephrosis.  Prior hysterectomy is noted.  A cystic lesion is seen within the left adnexa measuring 4.3 x 6.0 cm which shows some heterogeneous attenuation, but without definite solid enhancing component.  This has indeterminate but probably benign characteristics.  No other pelvic masses are identified.  No evidence of lymphadenopathy.  No evidence of inflammatory process or free fluid.  No evidence of bowel wall thickening or dilatation.  Normal appendix is visualized.  No hernia demonstrated.  Large colonic stool  burden noted.  IMPRESSION:  1.  6 cm cystic lesion in the left adnexa, with indeterminate but probably benign characteristics.  Recommend further evaluation with pelvic ultrasound in 6-12 weeks, or alternatively pelvic MRI without and with contrast could be performed for further characterization. 2.  Large stool burden noted; suggest clinical correlation for possible constipation. 3.  Mild hepatic steatosis.   Original Report Authenticated By: Danae Orleans, M.D.    Dg Chest Portable 1 View  03/29/2012  *RADIOLOGY REPORT*  Clinical Data: Chest pain.  PORTABLE CHEST - 1 VIEW  Comparison: PA and lateral chest 03/27/2012.  Findings: The patient is status post median sternotomy.  Lung volumes are low but the lungs are clear.  Heart size is normal.  No pneumothorax or pleural fluid.  IMPRESSION: No acute disease.   Original Report Authenticated By: Bernadene Bell. D'ALESSIO, M.D.    Dg Abd Acute W/chest  03/22/2012  *RADIOLOGY REPORT*  Clinical Data: Left chest pain  ACUTE ABDOMEN SERIES (ABDOMEN 2 VIEW & CHEST 1 VIEW)  Comparison: 03/08/2012  Findings: Mild bibasilar opacities.  Mild cardiomegaly.  Status post median sternotomy.  No pleural effusion or pneumothorax.  No free intraperitoneal air. The bowel gas pattern is non- obstructive. Organ outlines are normal where seen. No acute or aggressive osseous abnormality identified. Unchanged sclerosis adjacent to the inferior aspect of the left SI joint may reflect degenerative change.  Moderate stool burden.  IMPRESSION: Mild lung base opacities; atelectasis (favored) versus infiltrate.  Nonobstructive bowel gas pattern.  Moderate stool burden.   Original Report Authenticated By: Waneta Martins, M.D.     Microbiology: Recent  Results (from the past 240 hour(s))  URINE CULTURE     Status: Normal   Collection Time   03/27/12  3:29 PM      Component Value Range Status Comment   Specimen Description URINE, CLEAN CATCH   Final    Special Requests NONE   Final      Culture  Setup Time 03/28/2012 02:23   Final    Colony Count 45,000 COLONIES/ML   Final    Culture     Final    Value: GROUP B STREP(S.AGALACTIAE)ISOLATED     Note: TESTING AGAINST S. AGALACTIAE NOT ROUTINELY PERFORMED DUE TO PREDICTABILITY OF AMP/PEN/VAN SUSCEPTIBILITY.   Report Status 03/30/2012 FINAL   Final      Labs: Basic Metabolic Panel:  Lab 03/29/12 1610 03/27/12 1555  NA 135 135  K 3.4* 4.2  CL 94* 95*  CO2 30 30  GLUCOSE 309* 316*  BUN 11 10  CREATININE 0.56 0.64  CALCIUM 9.9 10.3  MG -- --  PHOS -- --   Liver Function Tests:  Lab 03/27/12 1555  AST 37  ALT 47*  ALKPHOS 111  BILITOT 0.4  PROT 8.9*  ALBUMIN 4.3    Lab 03/27/12 1555  LIPASE 48  AMYLASE --   No results found for this basename: AMMONIA:5 in the last 168 hours CBC:  Lab 03/29/12 0400 03/27/12 1555  WBC 6.5 6.4  NEUTROABS 3.3 4.2  HGB 16.3* 16.9*  HCT 47.2* 49.0*  MCV 81.1 83.1  PLT 223 215   Cardiac Enzymes: No results found for this basename: CKTOTAL:5,CKMB:5,CKMBINDEX:5,TROPONINI:5 in the last 168 hours BNP: BNP (last 3 results) No results found for this basename: PROBNP:3 in the last 8760 hours CBG:  Lab 03/30/12 1120 03/30/12 0734 03/29/12 2046 03/29/12 1636 03/29/12 1113  GLUCAP 258* 297* 277* 180* 260*       Signed:  MEMON,JEHANZEB  [facility] 03/30/2012, 12:35 PM

## 2012-03-30 NOTE — Progress Notes (Signed)
Nutrition Brief Note  Patient identified on the Malnutrition Screening Tool (MST) Report  Body mass index is 25.83 kg/(m^2). Pt meets criteria for overweight based on current BMI.   Current diet order is CHO Modifed, patient is consuming approximately 100% of meals at this time. Labs and medications reviewed.   No nutrition interventions warranted at this time. If nutrition issues arise, please consult RD.   #[phone]

## 2012-03-31 NOTE — Progress Notes (Signed)
Late entry:  Patient reported having very large bowel movement after receiving first milk and molasses enema. Pt states she feels "so much better."  Dr.Memon notified.

## 2012-04-06 DIAGNOSIS — G894 Chronic pain syndrome: Secondary | ICD-10-CM | POA: Diagnosis not present

## 2012-04-06 DIAGNOSIS — M531 Cervicobrachial syndrome: Secondary | ICD-10-CM | POA: Diagnosis not present

## 2012-04-06 DIAGNOSIS — M47812 Spondylosis without myelopathy or radiculopathy, cervical region: Secondary | ICD-10-CM | POA: Diagnosis not present

## 2012-04-06 NOTE — ED Notes (Signed)
IV infusion was stopped at 1952 on 03/27/12

## 2012-04-11 ENCOUNTER — Ambulatory Visit (INDEPENDENT_AMBULATORY_CARE_PROVIDER_SITE_OTHER): Payer: Medicare Other | Admitting: Internal Medicine

## 2012-04-11 ENCOUNTER — Encounter (INDEPENDENT_AMBULATORY_CARE_PROVIDER_SITE_OTHER): Payer: Self-pay | Admitting: Internal Medicine

## 2012-04-11 VITALS — BP 150/72 | HR 104 | Temp 98.3°F | Ht 70.0 in | Wt 155.5 lb

## 2012-04-11 DIAGNOSIS — K5900 Constipation, unspecified: Secondary | ICD-10-CM | POA: Diagnosis not present

## 2012-04-11 NOTE — Patient Instructions (Addendum)
Linzess daily. Magnesium Citrate 1 bottle today. With a supp. OV in 2 weeks. PR end of week

## 2012-04-11 NOTE — Progress Notes (Signed)
Subjective:     Patient ID: Anita [name], female   DOB: 1960/12/27, 51 y.o.   MRN: [id number]  HPI Presents today with c/o constipation. She had a BM this am which was small.  She was seen in the ED Recently for constipation and given 2 enemas. Her constipation stared 4 weeks ago. She is on multiple pain medications. She is having a BM in about every weeks. She is having to strain to have a BM. She has tried enemas, fleets suppos. Miralax, stool softners.  She saw blood 2 days ago when she was trying to have a BM  Ct abdomen/pelvis with CM 03/27/2012:1. 6 cm cystic lesion in the left adnexa, with indeterminate but  probably benign characteristics. Recommend further evaluation with  pelvic ultrasound in 6-12 weeks, or alternatively pelvic MRI  without and with contrast could be performed for further  characterization.  2. Large stool burden noted; suggest clinical correlation for  possible constipation.  3. Mild hepatic steatosis.   Last colonoscopy: she has never had. Review of Systems see hpi Current Outpatient Prescriptions  Medication Sig Dispense Refill  . ALPRAZolam (XANAX) 1 MG tablet Take 1 mg by mouth 2 (two) times daily.      Marland Kitchen amitriptyline (ELAVIL) 50 MG tablet Take 50 mg by mouth at bedtime.      Marland Kitchen amLODipine (NORVASC) 5 MG tablet Take 5 mg by mouth daily.      Marland Kitchen aspirin 81 MG tablet Take 81 mg by mouth daily.      Marland Kitchen FLUoxetine (PROZAC) 20 MG capsule Take 20 mg by mouth daily.      . metFORMIN (GLUCOPHAGE) 500 MG tablet Take 1 tablet (500 mg total) by mouth 2 (two) times daily with a meal.  60 tablet  1  . methadone (DOLOPHINE) 10 MG tablet Take 10 mg by mouth every 12 (twelve) hours.      . metoprolol (LOPRESSOR) 100 MG tablet Take 100 mg by mouth 2 (two) times daily.      . nitroGLYCERIN (NITROSTAT) 0.4 MG SL tablet Place 0.4 mg under the tongue every 5 (five) minutes as needed. For chest pain      . ondansetron (ZOFRAN) 4 MG tablet Take 1 tablet (4 mg total) by mouth every  8 (eight) hours as needed for nausea.  6 tablet  0  . polyethylene glycol (MIRALAX) packet Take 17 g by mouth daily.  14 each  0  . promethazine (PHENERGAN) 25 MG suppository Place 1 suppository (25 mg total) rectally every 6 (six) hours as needed for nausea.  12 each  0  . ramipril (ALTACE) 10 MG capsule Take 20 mg by mouth daily.      . saxagliptin HCl (ONGLYZA) 5 MG TABS tablet Take 5 mg by mouth daily.      . Tapentadol HCl (NUCYNTA) 100 MG TABS Take 1 tablet by mouth every 6 (six) hours as needed. For pain      . promethazine (PHENERGAN) 25 MG tablet Take 1 tablet (25 mg total) by mouth every 8 (eight) hours as needed for nausea.  15 tablet  0   Past Medical History  Diagnosis Date  . Nerve damage     to neck.  . Pain management   . Hypertension   . Diabetes mellitus   . Peripheral neuropathy   . Chronic neck pain   . Migraine headache   . Chronic nausea   . Incomplete RBBB   . Chronic abdominal pain   .  History of cardiac catheterization 02/15/11 Dr. Nicki Gu[place]   Allergies  Allergen Reactions  . Imitrex (Sumatriptan) Swelling    States she had swelling in neck  . Penicillins Rash        Objective:   Physical Exam Filed Vitals:   04/11/12 0926  BP: 150/72  Pulse: 104  Temp: 98.3 F (36.8 C)  Height: 5\' 10"  (1.778 m)  Weight: 155 lb 8 oz (70.534 kg)   Alert and oriented. Skin warm and dry. Oral mucosa is moist.   . Sclera anicteric, conjunctivae is pink. Thyroid not enlarged. No cervical lymphadenopathy. Lungs clear. Heart regular rate and rhythm.  Abdomen is soft. Bowel sounds are positive. No hepatomegaly. No abdominal masses felt. No tenderness.  No edema to lower extremities  No stool in rectum.  Guaiac negative.     Assessment:    Constipation probably narcotic induced. Abdomen is soft. I did not feel any stool in her rectum. Her bowel sounds were good.     Plan:    Continue the Miralax. Linzess po daily. OV in  2 weeks. Magnesium Citrate 1 bottle  today. Doculax supp. PR end of week.

## 2012-04-17 ENCOUNTER — Encounter (HOSPITAL_COMMUNITY): Payer: Self-pay | Admitting: *Deleted

## 2012-04-17 ENCOUNTER — Emergency Department (HOSPITAL_COMMUNITY): Payer: Medicare Other

## 2012-04-17 ENCOUNTER — Emergency Department (HOSPITAL_COMMUNITY)
Admission: EM | Admit: 2012-04-17 | Discharge: 2012-04-17 | Disposition: A | Discharge status: Home / Self Care | Payer: Medicare Other | Attending: Emergency Medicine | Admitting: Emergency Medicine

## 2012-04-17 DIAGNOSIS — I10 Essential (primary) hypertension: Secondary | ICD-10-CM | POA: Diagnosis not present

## 2012-04-17 DIAGNOSIS — R109 Unspecified abdominal pain: Secondary | ICD-10-CM | POA: Diagnosis not present

## 2012-04-17 DIAGNOSIS — R110 Nausea: Secondary | ICD-10-CM | POA: Insufficient documentation

## 2012-04-17 DIAGNOSIS — Z79899 Other long term (current) drug therapy: Secondary | ICD-10-CM | POA: Diagnosis not present

## 2012-04-17 DIAGNOSIS — G8929 Other chronic pain: Secondary | ICD-10-CM | POA: Insufficient documentation

## 2012-04-17 DIAGNOSIS — Z7982 Long term (current) use of aspirin: Secondary | ICD-10-CM | POA: Insufficient documentation

## 2012-04-17 DIAGNOSIS — K5900 Constipation, unspecified: Secondary | ICD-10-CM | POA: Insufficient documentation

## 2012-04-17 DIAGNOSIS — E119 Type 2 diabetes mellitus without complications: Secondary | ICD-10-CM | POA: Insufficient documentation

## 2012-04-17 DIAGNOSIS — G609 Hereditary and idiopathic neuropathy, unspecified: Secondary | ICD-10-CM | POA: Insufficient documentation

## 2012-04-17 DIAGNOSIS — I4510 Unspecified right bundle-branch block: Secondary | ICD-10-CM | POA: Diagnosis not present

## 2012-04-17 HISTORY — DX: Constipation, unspecified: K59.00

## 2012-04-17 LAB — URINE MICROSCOPIC-ADD ON

## 2012-04-17 LAB — COMPREHENSIVE METABOLIC PANEL WITH GFR
ALT: 66 U/L — ABNORMAL HIGH (ref 0–35)
AST: 47 U/L — ABNORMAL HIGH (ref 0–37)
Albumin: 4.2 g/dL (ref 3.5–5.2)
Alkaline Phosphatase: 146 U/L — ABNORMAL HIGH (ref 39–117)
BUN: 9 mg/dL (ref 6–23)
CO2: 29 meq/L (ref 19–32)
Calcium: 10 mg/dL (ref 8.4–10.5)
Chloride: 97 meq/L (ref 96–112)
Creatinine, Ser: 0.52 mg/dL (ref 0.50–1.10)
GFR calc Af Amer: >90 mL/min
GFR calc non Af Amer: >90 mL/min
Glucose, Bld: 426 mg/dL — ABNORMAL HIGH (ref 70–99)
Potassium: 4.2 meq/L (ref 3.5–5.1)
Sodium: 135 meq/L (ref 135–145)
Total Bilirubin: 0.3 mg/dL (ref 0.3–1.2)
Total Protein: 8.4 g/dL — ABNORMAL HIGH (ref 6.0–8.3)

## 2012-04-17 LAB — URINALYSIS, ROUTINE W REFLEX MICROSCOPIC
Bilirubin Urine: NEGATIVE
Glucose, UA: >1000 mg/dL — AB
Hgb urine dipstick: NEGATIVE
Ketones, ur: NEGATIVE mg/dL
Leukocytes, UA: NEGATIVE
Nitrite: NEGATIVE
Protein, ur: NEGATIVE mg/dL
Specific Gravity, Urine: 1.01 (ref 1.005–1.030)
Urobilinogen, UA: 0.2 mg/dL (ref 0.0–1.0)
pH: 6 (ref 5.0–8.0)

## 2012-04-17 LAB — LIPASE, BLOOD: Lipase: 58 U/L (ref 11–59)

## 2012-04-17 LAB — CBC WITH DIFFERENTIAL/PLATELET
Basophils Absolute: 0 10*3/uL (ref 0.0–0.1)
Basophils Relative: 0 % (ref 0–1)
Eosinophils Relative: 1 % (ref 0–5)
HCT: 46 % (ref 36.0–46.0)
MCHC: 34.1 g/dL (ref 30.0–36.0)
Monocytes Absolute: 0.3 10*3/uL (ref 0.1–1.0)
Neutro Abs: 3.3 10*3/uL (ref 1.7–7.7)
RDW: 13.1 % (ref 11.5–15.5)

## 2012-04-17 MED ORDER — PEG 3350-KCL-NA BICARB-NACL 420 G PO SOLR
4000.0000 mL | Freq: Once | ORAL | Status: AC
  Administered 2012-04-17: 4000 mL via ORAL
  Filled 2012-04-17: qty 4000

## 2012-04-17 MED ORDER — ONDANSETRON HCL 4 MG/2ML IJ SOLN
4.0000 mg | Freq: Once | INTRAMUSCULAR | Status: AC
  Administered 2012-04-17: 4 mg via INTRAVENOUS
  Filled 2012-04-17: qty 2

## 2012-04-17 MED ORDER — HYDROMORPHONE HCL PF 1 MG/ML IJ SOLN
1.0000 mg | Freq: Once | INTRAMUSCULAR | Status: AC
  Administered 2012-04-17: 1 mg via INTRAVENOUS
  Filled 2012-04-17: qty 1

## 2012-04-17 MED ORDER — SODIUM CHLORIDE 0.9 % IV BOLUS (SEPSIS)
1000.0000 mL | Freq: Once | INTRAVENOUS | Status: AC
  Administered 2012-04-17: 1000 mL via INTRAVENOUS

## 2012-04-17 NOTE — ED Notes (Signed)
C/o abd pain x 3 weeks and constipation, has seen Dr. Karilyn Cota for same, states LBM was 2 weeks ago

## 2012-04-17 NOTE — ED Provider Notes (Signed)
History    This chart was scribed for Anita Lennert, MD, MD by Smitty Pluck, ED Scribe. The patient was seen in room APA01 and the patient's care was started at 3:11PM.   CSN: [id number]  Arrival date & time 04/17/12  1342       Chief Complaint  Patient presents with  . Abdominal Pain  . Constipation    (Consider location/radiation/quality/duration/timing/severity/associated sxs/prior treatment) Patient is a 51 y.o. female presenting with abdominal pain and constipation. The history is provided by the patient. No language interpreter was used.  Abdominal Pain The primary symptoms of the illness include abdominal pain. The primary symptoms of the illness do not include fatigue or diarrhea. The current episode started more than 2 days ago. The onset of the illness was gradual.  The abdominal pain began more than 2 days ago. The pain came on gradually. The abdominal pain is located in the LUQ and LLQ. The abdominal pain does not radiate. The abdominal pain is relieved by nothing.  Additional symptoms associated with the illness include constipation. Symptoms associated with the illness do not include hematuria, frequency or back pain.  Constipation  The current episode started more than 2 weeks ago. The problem has been unchanged. The pain is moderate. Associated symptoms include abdominal pain. Pertinent negatives include no diarrhea, no hematuria, no chest pain, no headaches, no coughing and no rash.   Anita [name] is a 51 y.o. female who presents to the Emergency Department complaining of constant, moderate constipation onset 3 weeks ago. Pt reports that she has moderate abdominal pain. She has seen PA at Dr. Patty Sermons office. She has had CT and was told that she was impacted. She has taken Miralax without relief. She reports that she had last bowel movement 2 weeks ago.   Past Medical History  Diagnosis Date  . Nerve damage     to neck.  . Pain management   . Hypertension   .  Diabetes mellitus   . Peripheral neuropathy   . Chronic neck pain   . Migraine headache   . Chronic nausea   . Incomplete RBBB   . Chronic abdominal pain   . History of cardiac catheterization 02/15/11 Dr. Nicki Guadalajara  . Constipation     Past Surgical History  Procedure Date  . Leg surgery   . Asd repair   . Neck surgery   . Abdominal hysterectomy   . Cardiac catheterization     History reviewed. No pertinent family history.  History  Substance Use Topics  . Smoking status: Never Smoker   . Smokeless tobacco: Not on file  . Alcohol Use: No    OB History    Grav Para Term Preterm Abortions TAB SAB Ect Mult Living                  Review of Systems  Constitutional: Negative for fatigue.  HENT: Negative for congestion, sinus pressure and ear discharge.   Eyes: Negative for discharge.  Respiratory: Negative for cough.   Cardiovascular: Negative for chest pain.  Gastrointestinal: Positive for abdominal pain and constipation. Negative for diarrhea.  Genitourinary: Negative for frequency and hematuria.  Musculoskeletal: Negative for back pain.  Skin: Negative for rash.  Neurological: Negative for seizures and headaches.  Hematological: Negative.   Psychiatric/Behavioral: Negative for hallucinations.  All other systems reviewed and are negative.    Allergies  Imitrex and Penicillins  Home Medications   Current Outpatient Rx  Name  Route  Sig  Dispense  Refill  . ALPRAZOLAM 1 MG PO TABS   Oral   Take 1 mg by mouth 2 (two) times daily.         Marland Kitchen AMITRIPTYLINE HCL 50 MG PO TABS   Oral   Take 50 mg by mouth at bedtime.         Marland Kitchen AMLODIPINE BESYLATE 5 MG PO TABS   Oral   Take 5 mg by mouth daily.         . ASPIRIN 81 MG PO TABS   Oral   Take 81 mg by mouth daily.         Marland Kitchen DOCUSATE SODIUM 100 MG PO CAPS   Oral   Take 100 mg by mouth 4 (four) times daily.         Marland Kitchen FLUOXETINE HCL 20 MG PO CAPS   Oral   Take 20 mg by mouth daily.           Marland Kitchen LINACLOTIDE 290 MCG PO CAPS   Oral   Take 1 tablet by mouth daily.         Marland Kitchen MAGNESIUM CITRATE PO SOLN   Oral   Take 1 Bottle by mouth once.         . METFORMIN HCL 500 MG PO TABS   Oral   Take 1 tablet (500 mg total) by mouth 2 (two) times daily with a meal.   60 tablet   1   . METHADONE HCL 10 MG PO TABS   Oral   Take 10 mg by mouth every 12 (twelve) hours.         Marland Kitchen METOPROLOL TARTRATE 100 MG PO TABS   Oral   Take 100 mg by mouth 2 (two) times daily.         Marland Kitchen NITROGLYCERIN 0.4 MG SL SUBL   Sublingual   Place 0.4 mg under the tongue every 5 (five) minutes as needed. For chest pain         . ONDANSETRON HCL 4 MG PO TABS   Oral   Take 1 tablet (4 mg total) by mouth every 8 (eight) hours as needed for nausea.   6 tablet   0   . POLYETHYLENE GLYCOL 3350 PO PACK   Oral   Take 17 g by mouth daily.   14 each   0   . PROMETHAZINE HCL 25 MG PO TABS   Oral   Take 1 tablet (25 mg total) by mouth every 8 (eight) hours as needed for nausea.   15 tablet   0   . RAMIPRIL 10 MG PO CAPS   Oral   Take 20 mg by mouth daily.         Marland Kitchen SAXAGLIPTIN HCL 5 MG PO TABS   Oral   Take 5 mg by mouth daily.         Marland Kitchen TAPENTADOL HCL 100 MG PO TABS   Oral   Take 100 mg by mouth every 6 (six) hours as needed. Pain           BP 184/88  Pulse 108  Temp 98.3 F (36.8 C) (Oral)  Resp 20  Ht 5\' 5"  (1.651 m)  Wt 160 lb (72.576 kg)  BMI 26.63 kg/m2  SpO2 96%  Physical Exam  Nursing note and vitals reviewed. Constitutional: She is oriented to person, place, and time. She appears well-developed.  HENT:  Head: Normocephalic and atraumatic.  Eyes: Conjunctivae normal and EOM are  normal. No scleral icterus.  Neck: Neck supple. No thyromegaly present.  Cardiovascular: Normal rate and regular rhythm.  Exam reveals no gallop and no friction rub.   No murmur heard. Pulmonary/Chest: No stridor. She has no wheezes. She has no rales. She exhibits no tenderness.   Abdominal: She exhibits no distension. There is tenderness in the left upper quadrant and left lower quadrant. There is no rebound.  Musculoskeletal: Normal range of motion. She exhibits no edema.  Lymphadenopathy:    She has no cervical adenopathy.  Neurological: She is oriented to person, place, and time. Coordination normal.  Skin: No rash noted. No erythema.  Psychiatric: She has a normal mood and affect. Her behavior is normal.    ED Course  Procedures (including critical care time) DIAGNOSTIC STUDIES: Oxygen Saturation is 96% on room air, adequate by my interpretation.    COORDINATION OF CARE: 3:15 PM Discussed ED treatment with pt  3:19 PM Ordered:     . [COMPLETED] HYDROmorphone  1 mg Intravenous Once  . [COMPLETED] ondansetron  4 mg Intravenous Once       Labs Reviewed  CBC WITH DIFFERENTIAL - Abnormal; Notable for the following:    RBC 5.57 (*)     Hemoglobin 15.7 (*)     All other components within normal limits  COMPREHENSIVE METABOLIC PANEL - Abnormal; Notable for the following:    Glucose, Bld 426 (*)     Total Protein 8.4 (*)     AST 47 (*)     ALT 66 (*)     Alkaline Phosphatase 146 (*)     All other components within normal limits  URINALYSIS, ROUTINE W REFLEX MICROSCOPIC - Abnormal; Notable for the following:    Glucose, UA >1000 (*)     All other components within normal limits  URINE MICROSCOPIC-ADD ON - Abnormal; Notable for the following:    Squamous Epithelial / LPF FEW (*)     All other components within normal limits  LIPASE, BLOOD   Dg Abd Acute W/chest  04/17/2012  *RADIOLOGY REPORT*  Clinical Data: Constipation.  ACUTE ABDOMEN SERIES (ABDOMEN 2 VIEW & CHEST 1 VIEW)  Comparison: One-view chest 03/29/2012.  CT abdomen pelvis 10/28/2013None.  Findings: The heart size is exaggerated by low lung volumes.  The lungs are clear.  Moderate stool is present throughout the colon.  There is no obstruction or free air.  Bowel gas pattern is otherwise  unremarkable.  The axial skeleton is unremarkable.  IMPRESSION:  1.  Moderate stool throughout the colon without evidence for obstruction or free air. 2.  No acute cardiopulmonary disease.   Original Report Authenticated By: Marin Roberts, M.D.      No diagnosis found.    MDM      The chart was scribed for me under my direct supervision.  I personally performed the history, physical, and medical decision making and all procedures in the evaluation of this patient.Anita Lennert, MD 04/17/12 [id number]

## 2012-04-26 ENCOUNTER — Other Ambulatory Visit (INDEPENDENT_AMBULATORY_CARE_PROVIDER_SITE_OTHER): Payer: Self-pay | Admitting: *Deleted

## 2012-04-26 ENCOUNTER — Encounter (INDEPENDENT_AMBULATORY_CARE_PROVIDER_SITE_OTHER): Payer: Self-pay | Admitting: Internal Medicine

## 2012-04-26 ENCOUNTER — Encounter (INDEPENDENT_AMBULATORY_CARE_PROVIDER_SITE_OTHER): Payer: Self-pay | Admitting: *Deleted

## 2012-04-26 ENCOUNTER — Ambulatory Visit (INDEPENDENT_AMBULATORY_CARE_PROVIDER_SITE_OTHER): Payer: Medicare Other | Admitting: Internal Medicine

## 2012-04-26 VITALS — BP 160/74 | HR 60 | Temp 98.4°F | Ht 65.0 in | Wt 154.8 lb

## 2012-04-26 DIAGNOSIS — K5900 Constipation, unspecified: Secondary | ICD-10-CM

## 2012-04-26 DIAGNOSIS — R195 Other fecal abnormalities: Secondary | ICD-10-CM

## 2012-04-26 NOTE — Patient Instructions (Addendum)
Colonoscopy with Dr. Karilyn Cota. Mag Citrate today and Fleets enema every other day

## 2012-04-26 NOTE — Progress Notes (Signed)
Subjective:     Patient ID: Anita [name], female   DOB: November 05, 1960, 51 y.o.   MRN: [id number]  HPIPresents today for f/u of her constipation.  She was seen 2 weeks ago for same. She tells me she is miserable.  She tells me has hardly had a BM since her last visit 2 weeks ago. She was started on Linzess which has not helped.  She told me she has actually been constipated for at least 6 months but worse in the last 6 weeks. She was seen 1 1/2 weeks ago and was given Golytely and had very little BM.  She is taking Miralax, Linzess, and stool softners. She has been to the ED several times for her constipation and received an emema.    Ct abdomen/pelvis with CM 03/27/2012:1. 6 cm cystic lesion in the left adnexa, with indeterminate but  probably benign characteristics. Recommend further evaluation with  pelvic ultrasound in 6-12 weeks, or alternatively pelvic MRI  without and with contrast could be performed for further  characterization.  2. Large stool burden noted; suggest clinical correlation for  possible constipation.  3. Mild hepatic steatosis.   Review of Systems     Current Outpatient Prescriptions  Medication Sig Dispense Refill  . ALPRAZolam (XANAX) 1 MG tablet Take 1 mg by mouth 2 (two) times daily.      Marland Kitchen amitriptyline (ELAVIL) 50 MG tablet Take 50 mg by mouth at bedtime.      Marland Kitchen amLODipine (NORVASC) 5 MG tablet Take 5 mg by mouth daily.      Marland Kitchen aspirin 81 MG tablet Take 81 mg by mouth daily.      Marland Kitchen docusate sodium (COLACE) 100 MG capsule Take 100 mg by mouth 4 (four) times daily.      Marland Kitchen FLUoxetine (PROZAC) 20 MG capsule Take 20 mg by mouth daily.      . Linaclotide (LINZESS) 290 MCG CAPS Take 1 tablet by mouth daily.      . magnesium citrate SOLN Take 1 Bottle by mouth once.      . metFORMIN (GLUCOPHAGE) 500 MG tablet Take 1 tablet (500 mg total) by mouth 2 (two) times daily with a meal.  60 tablet  1  . methadone (DOLOPHINE) 10 MG tablet Take 10 mg by mouth every 12 (twelve)  hours.      . metoprolol (LOPRESSOR) 100 MG tablet Take 100 mg by mouth 2 (two) times daily.      . nitroGLYCERIN (NITROSTAT) 0.4 MG SL tablet Place 0.4 mg under the tongue every 5 (five) minutes as needed. For chest pain      . ondansetron (ZOFRAN) 4 MG tablet Take 1 tablet (4 mg total) by mouth every 8 (eight) hours as needed for nausea.  6 tablet  0  . polyethylene glycol (MIRALAX) packet Take 17 g by mouth daily.  14 each  0  . promethazine (PHENERGAN) 25 MG tablet Take 1 tablet (25 mg total) by mouth every 8 (eight) hours as needed for nausea.  15 tablet  0  . ramipril (ALTACE) 10 MG capsule Take 20 mg by mouth daily.      . saxagliptin HCl (ONGLYZA) 5 MG TABS tablet Take 5 mg by mouth daily.      . Tapentadol HCl (NUCYNTA) 100 MG TABS Take 100 mg by mouth every 6 (six) hours as needed. Pain       Past Medical History  Diagnosis Date  . Nerve damage     to  neck.  . Pain management   . Hypertension   . Diabetes mellitus   . Peripheral neuropathy   . Chronic neck pain   . Migraine headache   . Chronic nausea   . Incomplete RBBB   . Chronic abdominal pain   . History of cardiac catheterization 02/15/11 Dr. Nicki Guadalajara  . Constipation    Past Surgical History  Procedure Date  . Leg surgery   . Asd repair   . Neck surgery   . Abdominal hysterectomy   . Cardiac catheterization    Allergies  Allergen Reactions  . Imitrex (Sumatriptan) Swelling    States she had swelling in neck  . Penicillins Rash    Objective:   Physical Exam Filed Vitals:   04/26/12 1106  BP: 160/74  Pulse: 60  Temp: 98.4 F (36.9 C)  Height: 5\' 5"  (1.651 m)  Weight: 154 lb 12.8 oz (70.217 kg)   Alert and oriented. Skin warm and dry. Oral mucosa is moist.   . Sclera anicteric, conjunctivae is pink. Thyroid not enlarged. No cervical lymphadenopathy. Lungs clear. Heart regular rate and rhythm.  Abdomen is soft. Bowel sounds are positive. No hepatomegaly. No abdominal masses felt. No tenderness.  No  edema to lower extremities. Very small amount of stool and guaiac positive     Assessment:     Chronic constipation with heme positive stool. No formed stool in rectum.  Patient has never undergone a colonoscopy in the past.  Colonic neoplasm needs to be ruled out. Suspect constipation is narcotic induced.  Plan:    Colonoscopy with Dr. Karilyn Cota. The risks and benefits such as perforation, bleeding, and infection were reviewed with the patient and is agreeable.  Mag. Citrate today with a Fleets enema. May try Fleets enema every other day. Continue the Linzess

## 2012-04-26 NOTE — Telephone Encounter (Signed)
This encounter was created in error - please disregard.

## 2012-05-03 ENCOUNTER — Encounter (HOSPITAL_COMMUNITY): Payer: Self-pay | Admitting: Pharmacy Technician

## 2012-05-04 DIAGNOSIS — G894 Chronic pain syndrome: Secondary | ICD-10-CM | POA: Diagnosis not present

## 2012-05-04 DIAGNOSIS — M47812 Spondylosis without myelopathy or radiculopathy, cervical region: Secondary | ICD-10-CM | POA: Diagnosis not present

## 2012-05-04 MED ORDER — SODIUM CHLORIDE 0.45 % IV SOLN
INTRAVENOUS | Status: DC
  Administered 2012-05-05: 08:00:00 via INTRAVENOUS

## 2012-05-05 ENCOUNTER — Encounter (HOSPITAL_COMMUNITY)
Admission: RE | Disposition: A | Discharge status: Home / Self Care | Payer: Self-pay | Source: Ambulatory Visit | Attending: Internal Medicine

## 2012-05-05 ENCOUNTER — Ambulatory Visit (HOSPITAL_COMMUNITY)
Admission: RE | Admit: 2012-05-05 | Discharge: 2012-05-05 | Disposition: A | Discharge status: Home / Self Care | Payer: Medicare Other | Source: Ambulatory Visit | Attending: Internal Medicine | Admitting: Internal Medicine

## 2012-05-05 ENCOUNTER — Encounter (HOSPITAL_COMMUNITY): Payer: Self-pay

## 2012-05-05 DIAGNOSIS — R198 Other specified symptoms and signs involving the digestive system and abdomen: Secondary | ICD-10-CM

## 2012-05-05 DIAGNOSIS — Z538 Procedure and treatment not carried out for other reasons: Secondary | ICD-10-CM | POA: Diagnosis not present

## 2012-05-05 DIAGNOSIS — K5900 Constipation, unspecified: Secondary | ICD-10-CM | POA: Insufficient documentation

## 2012-05-05 DIAGNOSIS — K644 Residual hemorrhoidal skin tags: Secondary | ICD-10-CM | POA: Diagnosis not present

## 2012-05-05 DIAGNOSIS — E119 Type 2 diabetes mellitus without complications: Secondary | ICD-10-CM | POA: Diagnosis not present

## 2012-05-05 DIAGNOSIS — I10 Essential (primary) hypertension: Secondary | ICD-10-CM | POA: Insufficient documentation

## 2012-05-05 HISTORY — PX: COLONOSCOPY: SHX5424

## 2012-05-05 LAB — GLUCOSE, CAPILLARY: Glucose-Capillary: 206 mg/dL — ABNORMAL HIGH (ref 70–99)

## 2012-05-05 SURGERY — COLONOSCOPY
Anesthesia: Moderate Sedation

## 2012-05-05 MED ORDER — STERILE WATER FOR IRRIGATION IR SOLN
Status: DC | PRN
  Administered 2012-05-05: 09:00:00

## 2012-05-05 MED ORDER — MEPERIDINE HCL 50 MG/ML IJ SOLN
INTRAMUSCULAR | Status: AC
  Filled 2012-05-05: qty 1

## 2012-05-05 MED ORDER — LUBIPROSTONE 24 MCG PO CAPS: 24.0000 ug | ORAL_CAPSULE | Freq: Two times a day (BID) | ORAL | Status: DC

## 2012-05-05 MED ORDER — MIDAZOLAM HCL 5 MG/5ML IJ SOLN
INTRAMUSCULAR | Status: DC | PRN
  Administered 2012-05-05 (×2): 3 mg via INTRAVENOUS
  Administered 2012-05-05: 2 mg via INTRAVENOUS

## 2012-05-05 MED ORDER — MEPERIDINE HCL 50 MG/ML IJ SOLN
INTRAMUSCULAR | Status: DC | PRN
  Administered 2012-05-05 (×2): 25 mg via INTRAVENOUS

## 2012-05-05 MED ORDER — MIDAZOLAM HCL 5 MG/5ML IJ SOLN
INTRAMUSCULAR | Status: AC
  Filled 2012-05-05: qty 10

## 2012-05-05 NOTE — Op Note (Signed)
COLONOSCOPY PROCEDURE REPORT  PATIENT:  Anita [name]  MR#:  [id number] Birthdate:  [birth year], 51 y.o., female Endoscopist:  Dr. Malissa Hippo, MD Referred By:  Dr. Madelin Rear. Sherwood Gambler, MD  Procedure Date: 05/05/2012  Procedure:   Colonoscopy(incomplete secondary to poor prep).  Indications:  Patient is 51 year old Caucasian female who presents with recent onset of constipation refractory to therapy.  Informed Consent:  The procedure and risks were reviewed with the patient and informed consent was obtained.  Medications:  Demerol 50 mg IV Versed 8 mg IV  Description of procedure:  After a digital rectal exam was performed, that colonoscope was advanced from the anus through the rectum patient pieces of formed stool. Scope was passed and sigmoid colon which had large pieces of formed stool. Scope could only be passed to 50 cm. Mucosa was examined on the way out. Scope was retroflexed to examine anorectal junction.  Findings:   Large amount of formed stool in the sigmoid colon with inability to pass scope proximally. Mucosa of sigmoid colon and rectum is normal. Small hemorrhoids below the dentate line.  Therapeutic/Diagnostic Maneuvers Performed:  None  Complications:  None  Cecal Withdrawal Time:  NA  Impression:  Incomplete exam secondary to poor prep. No stricture or mass identified at sigmoid colon or rectosigmoid junction. Small external hemorrhoids. Suspect she has narcotic-induced constipation.  Recommendations:  Discontinue Linzess and polyethylene glycol. Lubiprostone 24 mcg by mouth twice a day. Patient instructed to use Fleet enema every third day if needed. She will keep stool diary and return for office visit in 4 weeks.   Jodi Kappes U  05/05/2012 9:14 AM  CC: Dr. Cassell Smiles., MD & Dr. Bonnetta Barry ref. provider found

## 2012-05-05 NOTE — H&P (Signed)
Anita [name] is an 51 y.o. female.   Chief Complaint: Anita [name] is here for colonoscopy. HPI: Anita [name] is 51 year old Caucasian female who presents with acute onset of constipation. She's been on polyethylene glycol and currently on Linzess. She also has tried stool softeners OTC laxatives as well as enemas. She hasn't been hematochezia. She says her bowels used to move but 3 times a week until about a month ago. She is on methadone and amitriptyline dose has not escalated recently. Family history is negative for colorectal carcinoma.  Past Medical History  Diagnosis Date  . Nerve damage     to neck.  . Pain management   . Hypertension   . Diabetes mellitus   . Peripheral neuropathy   . Chronic neck pain   . Migraine headache   . Chronic nausea   . Incomplete RBBB   . Chronic abdominal pain   . History of cardiac catheterization 02/15/11 Dr. Nicki Guadalajara  . Constipation     Past Surgical History  Procedure Date  . Leg surgery   . Asd repair   . Neck surgery   . Abdominal hysterectomy   . Cardiac catheterization     History reviewed. No pertinent family history. Social History:  reports that she has been smoking Cigarettes.  She has been smoking about .25 packs per day. She does not have any smokeless tobacco history on file. She reports that she does not drink alcohol or use illicit drugs.  Allergies:  Allergies  Allergen Reactions  . Imitrex (Sumatriptan) Swelling    States she had swelling in neck  . Penicillins Rash    Medications Prior to Admission  Medication Sig Dispense Refill  . ALPRAZolam (XANAX) 1 MG tablet Take 1 mg by mouth 2 (two) times daily.      Marland Kitchen amitriptyline (ELAVIL) 50 MG tablet Take 50 mg by mouth at bedtime.      Marland Kitchen amLODipine (NORVASC) 5 MG tablet Take 5 mg by mouth daily.      Marland Kitchen aspirin 81 MG tablet Take 81 mg by mouth daily.      Marland Kitchen docusate sodium (DULCOLAX) 100 MG capsule Take 100 mg by mouth daily.      Marland Kitchen FLUoxetine (PROZAC) 20 MG capsule Take 20  mg by mouth daily.      . methadone (DOLOPHINE) 10 MG tablet Take 10 mg by mouth 3 (three) times daily.       . metoprolol (LOPRESSOR) 100 MG tablet Take 100 mg by mouth 2 (two) times daily.      . nitroGLYCERIN (NITROSTAT) 0.4 MG SL tablet Place 0.4 mg under the tongue every 5 (five) minutes as needed. For chest pain      . oxyCODONE-acetaminophen (PERCOCET/ROXICET) 5-325 MG per tablet Take 1 tablet by mouth Twice daily as needed. pain      . polyethylene glycol (MIRALAX) packet Take 17 g by mouth daily.  14 each  0  . promethazine (PHENERGAN) 25 MG tablet Take 1 tablet (25 mg total) by mouth every 8 (eight) hours as needed for nausea.  15 tablet  0  . ramipril (ALTACE) 10 MG capsule Take 10 mg by mouth daily.       . metFORMIN (GLUCOPHAGE) 500 MG tablet Take 1 tablet (500 mg total) by mouth 2 (two) times daily with a meal.  60 tablet  1    No results found for this or any previous visit (from the past 48 hour(s)). No results found.  ROS  Blood  pressure 166/74, pulse 64, temperature 97.8 F (36.6 C), temperature source Oral, resp. rate 20, SpO2 95.00%. Physical Exam  Constitutional: She appears well-developed and well-nourished.  HENT:  Mouth/Throat: Oropharynx is clear and moist.  Eyes: Conjunctivae normal are normal. No scleral icterus.  Neck: No thyromegaly present.  Cardiovascular: Normal rate, regular rhythm and normal heart sounds.   No murmur heard. Respiratory: Effort normal and breath sounds normal.  GI: Soft. She exhibits no distension and no mass. There is no tenderness.  Musculoskeletal: She exhibits no edema.  Lymphadenopathy:    She has no cervical adenopathy.  Neurological: She is alert.  Skin: Skin is warm and dry.     Assessment/Plan Recent change in bowel habits with intractable constipation unresponsive to therapy. Diagnostic colonoscopy.  Ame Heagle U 05/05/2012, 8:52 AM

## 2012-05-08 ENCOUNTER — Telehealth (INDEPENDENT_AMBULATORY_CARE_PROVIDER_SITE_OTHER): Payer: Self-pay | Admitting: *Deleted

## 2012-05-08 NOTE — Telephone Encounter (Signed)
Per Dr Karilyn Cota [facility] op note, patient needs OV in 4 weeks

## 2012-05-08 NOTE — Telephone Encounter (Signed)
Apt has been scheduled for 06/06/11 with Dr. Karilyn Cota

## 2012-05-15 ENCOUNTER — Encounter (HOSPITAL_COMMUNITY): Payer: Self-pay | Admitting: Internal Medicine

## 2012-05-19 DIAGNOSIS — G8929 Other chronic pain: Secondary | ICD-10-CM | POA: Diagnosis not present

## 2012-05-19 DIAGNOSIS — I10 Essential (primary) hypertension: Secondary | ICD-10-CM | POA: Diagnosis not present

## 2012-05-19 DIAGNOSIS — E119 Type 2 diabetes mellitus without complications: Secondary | ICD-10-CM | POA: Diagnosis not present

## 2012-05-19 DIAGNOSIS — Z0000 Encounter for general adult medical examination without abnormal findings: Secondary | ICD-10-CM | POA: Diagnosis not present

## 2012-05-19 DIAGNOSIS — E1149 Type 2 diabetes mellitus with other diabetic neurological complication: Secondary | ICD-10-CM | POA: Diagnosis not present

## 2012-05-19 DIAGNOSIS — E1142 Type 2 diabetes mellitus with diabetic polyneuropathy: Secondary | ICD-10-CM | POA: Diagnosis not present

## 2012-05-29 ENCOUNTER — Ambulatory Visit: Payer: Medicare Other | Admitting: Urgent Care

## 2012-06-01 ENCOUNTER — Emergency Department (HOSPITAL_COMMUNITY)
Admission: EM | Admit: 2012-06-01 | Discharge: 2012-06-01 | Disposition: A | Discharge status: Home / Self Care | Payer: Medicare Other | Attending: Emergency Medicine | Admitting: Emergency Medicine

## 2012-06-01 ENCOUNTER — Encounter (HOSPITAL_COMMUNITY): Payer: Self-pay

## 2012-06-01 ENCOUNTER — Emergency Department (HOSPITAL_COMMUNITY): Payer: Medicare Other

## 2012-06-01 DIAGNOSIS — K5900 Constipation, unspecified: Secondary | ICD-10-CM | POA: Insufficient documentation

## 2012-06-01 DIAGNOSIS — Z7982 Long term (current) use of aspirin: Secondary | ICD-10-CM | POA: Insufficient documentation

## 2012-06-01 DIAGNOSIS — K5732 Diverticulitis of large intestine without perforation or abscess without bleeding: Secondary | ICD-10-CM | POA: Diagnosis not present

## 2012-06-01 DIAGNOSIS — Z8669 Personal history of other diseases of the nervous system and sense organs: Secondary | ICD-10-CM | POA: Insufficient documentation

## 2012-06-01 DIAGNOSIS — M542 Cervicalgia: Secondary | ICD-10-CM | POA: Diagnosis not present

## 2012-06-01 DIAGNOSIS — R109 Unspecified abdominal pain: Secondary | ICD-10-CM

## 2012-06-01 DIAGNOSIS — G8929 Other chronic pain: Secondary | ICD-10-CM | POA: Diagnosis not present

## 2012-06-01 DIAGNOSIS — Z79899 Other long term (current) drug therapy: Secondary | ICD-10-CM | POA: Diagnosis not present

## 2012-06-01 DIAGNOSIS — I10 Essential (primary) hypertension: Secondary | ICD-10-CM | POA: Insufficient documentation

## 2012-06-01 DIAGNOSIS — Z8679 Personal history of other diseases of the circulatory system: Secondary | ICD-10-CM | POA: Insufficient documentation

## 2012-06-01 DIAGNOSIS — E119 Type 2 diabetes mellitus without complications: Secondary | ICD-10-CM | POA: Diagnosis not present

## 2012-06-01 DIAGNOSIS — F17200 Nicotine dependence, unspecified, uncomplicated: Secondary | ICD-10-CM | POA: Insufficient documentation

## 2012-06-01 DIAGNOSIS — K5792 Diverticulitis of intestine, part unspecified, without perforation or abscess without bleeding: Secondary | ICD-10-CM

## 2012-06-01 LAB — URINALYSIS, ROUTINE W REFLEX MICROSCOPIC
Hgb urine dipstick: NEGATIVE
Nitrite: NEGATIVE
Protein, ur: NEGATIVE mg/dL
Specific Gravity, Urine: 1.02 (ref 1.005–1.030)
Urobilinogen, UA: 0.2 mg/dL (ref 0.0–1.0)

## 2012-06-01 LAB — CBC WITH DIFFERENTIAL/PLATELET
Basophils Absolute: 0 10*3/uL (ref 0.0–0.1)
Eosinophils Absolute: 0.1 10*3/uL (ref 0.0–0.7)
Eosinophils Relative: 2 % (ref 0–5)
Lymphocytes Relative: 29 % (ref 12–46)
Lymphs Abs: 2.6 10*3/uL (ref 0.7–4.0)
MCH: 29.1 pg (ref 26.0–34.0)
MCV: 83.7 fL (ref 78.0–100.0)
Neutrophils Relative %: 61 % (ref 43–77)
Platelets: 260 10*3/uL (ref 150–400)
RBC: 5.23 MIL/uL — ABNORMAL HIGH (ref 3.87–5.11)
RDW: 13.3 % (ref 11.5–15.5)
WBC: 9.1 10*3/uL (ref 4.0–10.5)

## 2012-06-01 LAB — BASIC METABOLIC PANEL
Calcium: 9.2 mg/dL (ref 8.4–10.5)
GFR calc non Af Amer: >90 mL/min (ref >90)
Potassium: 4.4 mEq/L (ref 3.5–5.1)
Sodium: 135 mEq/L (ref 135–145)

## 2012-06-01 MED ORDER — METRONIDAZOLE 500 MG PO TABS
500.0000 mg | ORAL_TABLET | Freq: Once | ORAL | Status: AC
  Administered 2012-06-01: 500 mg via ORAL
  Filled 2012-06-01: qty 1

## 2012-06-01 MED ORDER — ONDANSETRON HCL 4 MG/2ML IJ SOLN
4.0000 mg | Freq: Once | INTRAMUSCULAR | Status: AC
  Administered 2012-06-01: 4 mg via INTRAVENOUS
  Filled 2012-06-01: qty 2

## 2012-06-01 MED ORDER — CIPROFLOXACIN HCL 500 MG PO TABS: 500.0000 mg | ORAL_TABLET | Freq: Two times a day (BID) | ORAL | Status: DC

## 2012-06-01 MED ORDER — HYDROMORPHONE HCL PF 1 MG/ML IJ SOLN
1.0000 mg | Freq: Once | INTRAMUSCULAR | Status: AC
  Administered 2012-06-01: 1 mg via INTRAVENOUS
  Filled 2012-06-01: qty 1

## 2012-06-01 MED ORDER — METRONIDAZOLE 500 MG PO TABS: 500.0000 mg | ORAL_TABLET | Freq: Two times a day (BID) | ORAL | Status: DC

## 2012-06-01 MED ORDER — ONDANSETRON HCL 4 MG/2ML IJ SOLN: 4.0000 mg | Freq: Once | INTRAMUSCULAR | Status: DC

## 2012-06-01 MED ORDER — CIPROFLOXACIN IN D5W 400 MG/200ML IV SOLN
400.0000 mg | Freq: Once | INTRAVENOUS | Status: AC
  Administered 2012-06-01: 400 mg via INTRAVENOUS
  Filled 2012-06-01: qty 200

## 2012-06-01 MED ORDER — SODIUM CHLORIDE 0.9 % IV SOLN
Freq: Once | INTRAVENOUS | Status: AC
  Administered 2012-06-01: 06:00:00 via INTRAVENOUS

## 2012-06-01 MED ORDER — HYDROMORPHONE HCL PF 2 MG/ML IJ SOLN: 2.0000 mg | Freq: Once | INTRAMUSCULAR | Status: DC

## 2012-06-01 MED ORDER — ONDANSETRON 4 MG PO TBDP: 4.0000 mg | ORAL_TABLET | Freq: Three times a day (TID) | ORAL | Status: DC | PRN

## 2012-06-01 MED ORDER — SODIUM CHLORIDE 0.9 % IV SOLN: Freq: Once | INTRAVENOUS | Status: DC

## 2012-06-01 NOTE — ED Notes (Signed)
Patient ambulatory to restroom with steady gait.

## 2012-06-01 NOTE — ED Provider Notes (Signed)
History     CSN: [id number]  Arrival date & time 06/01/12  0451   First MD Initiated Contact with Patient 06/01/12 [phone]      Chief Complaint  Patient presents with  . Flank Pain    (Consider location/radiation/quality/duration/timing/severity/associated sxs/prior treatment) HPI Anita [name] is a 52 y.o. female with a h/o chronic constipation, chronic abdominal pain , HTN, DM, neuropathy,chronic pain on pain management who presents to the Emergency Department complaining of left sided abdominal pain that radiates to her flank. She states she has had diverticulitis in the past and it feels the same as the current pain. Associated with nausea, no vomiting or diarrhea. Nothing she has done has made it better. She is seeing Dr. Karilyn Cota for the chronic constipation and is on a bowel regimen of laxatives.  PCP Dr. Sherwood Gambler Past Medical History  Diagnosis Date  . Nerve damage     to neck.  . Pain management   . Hypertension   . Diabetes mellitus   . Peripheral neuropathy   . Chronic neck pain   . Migraine headache   . Chronic nausea   . Incomplete RBBB   . Chronic abdominal pain   . History of cardiac catheterization 02/15/11 Dr. Nicki Guadalajara  . Constipation     Past Surgical History  Procedure Date  . Leg surgery   . Asd repair   . Neck surgery   . Abdominal hysterectomy   . Cardiac catheterization   . Colonoscopy 05/05/2012    Procedure: COLONOSCOPY;  Surgeon: Malissa Hippo, MD;  Location: [facility];  Service: Endoscopy;  Laterality: N/A;  830    No family history on file.  History  Substance Use Topics  . Smoking status: Current Every Day Smoker -- 0.2 packs/day    Types: Cigarettes  . Smokeless tobacco: Not on file     Comment: 5-8 cigarettes a day  . Alcohol Use: No    OB History    Grav Para Term Preterm Abortions TAB SAB Ect Mult Living                  Review of Systems  Constitutional: Negative for fever.       10 Systems reviewed and are negative for  acute change except as noted in the HPI.  HENT: Negative for congestion.   Eyes: Negative for discharge and redness.  Respiratory: Negative for cough and shortness of breath.   Cardiovascular: Negative for chest pain.  Gastrointestinal: Positive for abdominal pain. Negative for vomiting.  Musculoskeletal: Negative for back pain.  Skin: Negative for rash.  Neurological: Negative for syncope, numbness and headaches.  Psychiatric/Behavioral:       No behavior change.    Allergies  Imitrex and Penicillins  Home Medications   Current Outpatient Rx  Name  Route  Sig  Dispense  Refill  . ALPRAZOLAM 1 MG PO TABS   Oral   Take 1 mg by mouth 2 (two) times daily.         Marland Kitchen AMITRIPTYLINE HCL 50 MG PO TABS   Oral   Take 50 mg by mouth at bedtime.         Marland Kitchen AMLODIPINE BESYLATE 5 MG PO TABS   Oral   Take 5 mg by mouth daily.         . ASPIRIN 81 MG PO TABS   Oral   Take 81 mg by mouth daily.         Marland Kitchen DOCUSATE  SODIUM 100 MG PO CAPS   Oral   Take 100 mg by mouth daily.         Marland Kitchen FLUOXETINE HCL 20 MG PO CAPS   Oral   Take 20 mg by mouth daily.         . LUBIPROSTONE 24 MCG PO CAPS   Oral   Take 1 capsule (24 mcg total) by mouth 2 (two) times daily with a meal.   60 capsule   5   . METFORMIN HCL 500 MG PO TABS   Oral   Take 1 tablet (500 mg total) by mouth 2 (two) times daily with a meal.   60 tablet   1   . METHADONE HCL 10 MG PO TABS   Oral   Take 10 mg by mouth 3 (three) times daily.          Marland Kitchen METOPROLOL TARTRATE 100 MG PO TABS   Oral   Take 100 mg by mouth 2 (two) times daily.         Marland Kitchen NITROGLYCERIN 0.4 MG SL SUBL   Sublingual   Place 0.4 mg under the tongue every 5 (five) minutes as needed. For chest pain         . OXYCODONE-ACETAMINOPHEN 5-325 MG PO TABS   Oral   Take 1 tablet by mouth Twice daily as needed. pain         . PROMETHAZINE HCL 25 MG PO TABS   Oral   Take 1 tablet (25 mg total) by mouth every 8 (eight) hours as needed for  nausea.   15 tablet   0   . RAMIPRIL 10 MG PO CAPS   Oral   Take 10 mg by mouth daily.            BP 123/64  Pulse 60  Temp 98.2 F (36.8 C) (Oral)  Resp 18  Ht 5\' 5"  (1.651 m)  Wt 155 lb (70.308 kg)  BMI 25.79 kg/m2  SpO2 95%  Physical Exam  Nursing note and vitals reviewed. Constitutional:       Awake, alert, appears uncomfortable.  HENT:  Head: Atraumatic.  Eyes: Right eye exhibits no discharge. Left eye exhibits no discharge.  Neck: Neck supple.  Cardiovascular: Normal heart sounds.   Pulmonary/Chest: Effort normal. No respiratory distress. She exhibits no tenderness.  Abdominal: Soft. There is tenderness. There is guarding. There is no rebound.       tenderness to palpation to left side of abdomen.  Musculoskeletal: She exhibits no tenderness.       Baseline ROM, no obvious new focal weakness.  Neurological:       Mental status and motor strength appears baseline for patient and situation.  Skin: No rash noted.  Psychiatric: She has a normal mood and affect.    ED Course  Procedures (including critical care time)  Labs Reviewed  CBC WITH DIFFERENTIAL - Abnormal; Notable for the following:    RBC 5.23 (*)     Hemoglobin 15.2 (*)     All other components within normal limits  BASIC METABOLIC PANEL - Abnormal; Notable for the following:    Glucose, Bld 174 (*)     All other components within normal limits  URINALYSIS, ROUTINE W REFLEX MICROSCOPIC   Dg Abd Acute W/chest  06/01/2012  *RADIOLOGY REPORT*  Clinical Data: Left-sided abdominal pain, vomiting and constipation.  ACUTE ABDOMEN SERIES (ABDOMEN 2 VIEW & CHEST 1 VIEW)  Comparison: Chest and abdominal radiographs performed 04/17/2012  Findings: The lungs are well-aerated.  Minimal left basilar atelectasis is noted.  There is no evidence of pleural effusion or pneumothorax.  The cardiomediastinal silhouette is normal in size; the patient is status post median sternotomy.  The visualized bowel gas pattern is  unremarkable.  Scattered stool and air are seen within the colon; there is no evidence of small bowel dilatation to suggest obstruction.  No free intra-abdominal air is identified on the provided upright view.  No acute osseous abnormalities are seen; the sacroiliac joints are unremarkable in appearance.  IMPRESSION:  1.  Unremarkable bowel gas pattern; no free intra-abdominal air seen.  Relatively large amount of stool within the colon could reflect very mild constipation, though the sigmoid colon is partially decompressed. 2.  Minimal left basilar atelectasis noted; lungs otherwise clear.   Original Report Authenticated By: Tonia Ghent, M.D.         MDM  Patient presents with left sided abdominal pain in the setting of a h/o diverticulitis and chronic constipation. Labs are unremarkable. Initiated pain management and antibiotic therapy. Acute abdominal series was unremarkable except for stool. Reviewed results with patient and her husband. She is to follow up with GI. Pt stable in ED with no significant deterioration in condition.The patient appears reasonably screened and/or stabilized for discharge and I doubt any other medical condition or other [facility] requiring further screening, evaluation, or treatment in the ED at this time prior to discharge.  MDM Reviewed: nursing note, vitals and previous chart Interpretation: labs and x-ray           Nicoletta Dress. Colon Branch, MD 06/01/12 [phone]

## 2012-06-01 NOTE — ED Notes (Signed)
Complaining of left flank pain, started around 200 tonight and it kept getting worse per pt. Hurts to move, hurts for anything to touch my side per pt. Denies having diarrhea, has been nauseated with vomiting per pt.

## 2012-06-01 NOTE — ED Notes (Signed)
Patient with no complaints at this time. Respirations even and unlabored. Skin warm/dry. Discharge instructions reviewed with patient at this time. Patient given opportunity to voice concerns/ask questions. IV removed per policy and band-aid applied to site. Patient discharged at this time and left Emergency Department with steady gait.  

## 2012-06-05 ENCOUNTER — Ambulatory Visit (HOSPITAL_COMMUNITY)
Admission: RE | Admit: 2012-06-05 | Discharge: 2012-06-05 | Disposition: A | Discharge status: Home / Self Care | Payer: Medicare Other | Source: Ambulatory Visit | Attending: Internal Medicine | Admitting: Internal Medicine

## 2012-06-05 ENCOUNTER — Encounter (INDEPENDENT_AMBULATORY_CARE_PROVIDER_SITE_OTHER): Payer: Self-pay | Admitting: Internal Medicine

## 2012-06-05 ENCOUNTER — Ambulatory Visit (INDEPENDENT_AMBULATORY_CARE_PROVIDER_SITE_OTHER): Payer: Medicare Other | Admitting: Internal Medicine

## 2012-06-05 VITALS — BP 120/78 | HR 68 | Temp 98.2°F | Resp 18 | Ht 65.0 in | Wt 154.0 lb

## 2012-06-05 DIAGNOSIS — R1032 Left lower quadrant pain: Secondary | ICD-10-CM | POA: Diagnosis not present

## 2012-06-05 DIAGNOSIS — K838 Other specified diseases of biliary tract: Secondary | ICD-10-CM | POA: Diagnosis not present

## 2012-06-05 DIAGNOSIS — K7689 Other specified diseases of liver: Secondary | ICD-10-CM | POA: Diagnosis not present

## 2012-06-05 DIAGNOSIS — K5909 Other constipation: Secondary | ICD-10-CM | POA: Insufficient documentation

## 2012-06-05 DIAGNOSIS — R911 Solitary pulmonary nodule: Secondary | ICD-10-CM | POA: Insufficient documentation

## 2012-06-05 MED ORDER — OXYCODONE HCL 5 MG PO TABA: 5.0000 mg | ORAL_TABLET | Freq: Four times a day (QID) | ORAL | Status: DC | PRN

## 2012-06-05 MED ORDER — IOHEXOL 300 MG/ML  SOLN
100.0000 mL | Freq: Once | INTRAMUSCULAR | Status: AC | PRN
  Administered 2012-06-05: 100 mL via INTRAVENOUS

## 2012-06-05 NOTE — Patient Instructions (Signed)
Discontinue amitiza for now. Go back on polyethylene glycol MiraLax 17 g by mouth once or twice daily

## 2012-06-05 NOTE — Progress Notes (Signed)
Presenting complaint;  Severe LLQ abdominal pain.  Subjective:  Patient is 52 year old Caucasian female with multiple medical problems including chronic neck pain and headache was on methadone who presents to the office with complaints of severe pain in left low quadrant of her abdomen. She says she's had this pain off and on for 4 months. 3 months ago she was told she had diverticulitis. She was back in emergency room on 06/01/2012 and a son she was seen by Dr. Greta Doom and felt to have diverticulitis. Begun on Cipro and metronidazole. However she feels no better. She experienced 3 episodes of nausea and vomiting yesterday. Now she is on very limited food intake. She also complains of headache. She says rainy weather makes her headache worse and this also causes her to throw up. She continues to complain of constant neck pain. She denies fever chills melena or rectal bleeding. She has chronic constipation. Lubiprostone helped initially but not anymore. Colonoscopy was attempted in November 2013 but was a limited exam as she was constipated.  Current Medications: Current Outpatient Prescriptions  Medication Sig Dispense Refill  . ALPRAZolam (XANAX) 1 MG tablet Take 1 mg by mouth 2 (two) times daily.      Marland Kitchen amLODipine (NORVASC) 5 MG tablet Take 5 mg by mouth daily.      Marland Kitchen aspirin 81 MG tablet Take 81 mg by mouth daily.      . ciprofloxacin (CIPRO) 500 MG tablet Take 1 tablet (500 mg total) by mouth 2 (two) times daily.  14 tablet  0  . docusate sodium (DULCOLAX) 100 MG capsule Take 100 mg by mouth daily.      Marland Kitchen FLUoxetine (PROZAC) 20 MG capsule Take 20 mg by mouth daily.      Marland Kitchen glyBURIDE-metformin (GLUCOVANCE) 5-500 MG per tablet 3 (three) times daily.       Marland Kitchen lubiprostone (AMITIZA) 24 MCG capsule Take 1 capsule (24 mcg total) by mouth 2 (two) times daily with a meal.  60 capsule  5  . metFORMIN (GLUCOPHAGE) 500 MG tablet Take 1 tablet (500 mg total) by mouth 2 (two) times daily with a meal.   60 tablet  1  . methadone (DOLOPHINE) 10 MG tablet Take 10 mg by mouth 3 (three) times daily.       . metoprolol (LOPRESSOR) 100 MG tablet Take 100 mg by mouth 2 (two) times daily.      . metroNIDAZOLE (FLAGYL) 500 MG tablet Take 1 tablet (500 mg total) by mouth 2 (two) times daily.  14 tablet  0  . nitroGLYCERIN (NITROSTAT) 0.4 MG SL tablet Place 0.4 mg under the tongue every 5 (five) minutes as needed. For chest pain      . ondansetron (ZOFRAN ODT) 4 MG disintegrating tablet Take 1 tablet (4 mg total) by mouth every 8 (eight) hours as needed for nausea.  20 tablet  0  . promethazine (PHENERGAN) 25 MG tablet Take 1 tablet (25 mg total) by mouth every 8 (eight) hours as needed for nausea.  15 tablet  0  . ramipril (ALTACE) 10 MG capsule Take 10 mg by mouth daily.       Marland Kitchen amitriptyline (ELAVIL) 50 MG tablet Take 50 mg by mouth at bedtime.      Marland Kitchen oxyCODONE-acetaminophen (PERCOCET/ROXICET) 5-325 MG per tablet Take 1 tablet by mouth Twice daily as needed. pain         Objective: Blood pressure 120/78, pulse 68, temperature 98.2 F (36.8 C), temperature source Oral, resp.  rate 18, height 5\' 5"  (1.651 m), weight 154 lb (69.854 kg).  Patient is alert and appears to be anxious. Conjunctiva is pink. Sclera is nonicteric Oropharyngeal mucosa is normal. No neck masses or thyromegaly noted. Cardiac exam with regular rhythm normal S1 and S2. No murmur or gallop noted. Lungs are clear to auscultation. Abdomen. Is symmetrical. Bowel sounds are normal. Abdomen is soft with mild to moderate tenderness in left low quadrant with some guarding. No organomegaly or masses noted.  No LE edema or clubbing noted.  Labs/studies Results: Results of CBC and metabolic 7 from 06/01/2012 reviewed. WBC normal at 9.1. Abdominopelvic CT from 03/27/2012 also reviewed. To any changes of diverticulitis. It did show 6 cm cystic lesion involving left adnexa homogeneous appearance. She had colon and mild hepatic  steatosis.    Assessment:  #1. Severe left lower quadrant abdominal pain not responding to antibiotic therapy. On a recent ER visit she was told she has diverticulitis. CT of 9 weeks ago did not show any changes of diverticulitis but she did have cystic lesion involving left ovary. It is possible she could have bled into this cystic lesion. I am not convinced that she has diverticulitis or diverticulitis with complication as she has seen no improvement whatsoever with 4 days of antibiotics. It is possible that she has severe pain secondary to constipation predominant IBS. #2. Chronic constipation are dominantly secondary to medications.   Plan:  Continue full liquids. Oxycodone 5 mg by mouth 4 times a day when necessary for severe pain only. Prescription for 20 doses given. Abdominopelvic CT with contrast to be done ASAP. Hold Amitiza. Resume MiraLax 17 g by mouth daily Further recommendations to follow.

## 2012-06-10 ENCOUNTER — Encounter (HOSPITAL_COMMUNITY): Payer: Self-pay | Admitting: *Deleted

## 2012-06-10 ENCOUNTER — Inpatient Hospital Stay (HOSPITAL_COMMUNITY)
Admission: AD | Admit: 2012-06-10 | Discharge: 2012-06-10 | Disposition: A | Discharge status: Home / Self Care | Payer: Medicare Other | Source: Ambulatory Visit | Attending: Obstetrics and Gynecology | Admitting: Obstetrics and Gynecology

## 2012-06-10 ENCOUNTER — Inpatient Hospital Stay (HOSPITAL_COMMUNITY): Payer: Medicare Other

## 2012-06-10 DIAGNOSIS — N83209 Unspecified ovarian cyst, unspecified side: Secondary | ICD-10-CM

## 2012-06-10 DIAGNOSIS — R1032 Left lower quadrant pain: Secondary | ICD-10-CM | POA: Insufficient documentation

## 2012-06-10 DIAGNOSIS — K5900 Constipation, unspecified: Secondary | ICD-10-CM | POA: Insufficient documentation

## 2012-06-10 DIAGNOSIS — N949 Unspecified condition associated with female genital organs and menstrual cycle: Secondary | ICD-10-CM | POA: Diagnosis not present

## 2012-06-10 HISTORY — DX: Unspecified ovarian cyst, unspecified side: N83.209

## 2012-06-10 HISTORY — DX: Major depressive disorder, single episode, unspecified: F32.9

## 2012-06-10 HISTORY — DX: Urinary tract infection, site not specified: N39.0

## 2012-06-10 HISTORY — DX: Depression, unspecified: F32.A

## 2012-06-10 HISTORY — DX: Anemia, unspecified: D64.9

## 2012-06-10 HISTORY — DX: Anxiety disorder, unspecified: F41.9

## 2012-06-10 LAB — URINALYSIS, ROUTINE W REFLEX MICROSCOPIC
Leukocytes, UA: NEGATIVE
Nitrite: NEGATIVE
Protein, ur: NEGATIVE mg/dL
Specific Gravity, Urine: 1.01 (ref 1.005–1.030)
Urobilinogen, UA: 0.2 mg/dL (ref 0.0–1.0)

## 2012-06-10 LAB — URINE MICROSCOPIC-ADD ON

## 2012-06-10 MED ORDER — HYDROMORPHONE HCL PF 1 MG/ML IJ SOLN
2.0000 mg | Freq: Once | INTRAMUSCULAR | Status: AC
  Administered 2012-06-10: 2 mg via INTRAMUSCULAR
  Filled 2012-06-10 (×2): qty 1

## 2012-06-10 NOTE — MAU Note (Signed)
Pt reports she had severe abd pain on left side that started 3 days ago. Had  CT on Monday showed she had a large ovarian cyst on left. Reports increase swelling of her abd on the left side as well.

## 2012-06-10 NOTE — MAU Note (Addendum)
Ovarian cyst - was noted on CT in Oct, correction- has not increased in size is slightly SMALLer now.  Had been having pain that just kept getting worse.  Been having problems with constipation for several months, thinks the cyst is putting pressure on the GI tract.  Was trying to wait until Mon, but the pain has got too bad.

## 2012-06-10 NOTE — MAU Provider Note (Signed)
CC: Abdominal Pain    First Provider Initiated Contact with Patient 06/10/12 1944      HPI Anita [name] is a 52 y.o. [id number]  who presents with 3 day hx severe LLQ abdominal pain. Has known 5.9x4cm left ovarian cyst by CT (06/05/2012) and has appointment with Dr. Marcelle Overlie in 2 days. Has taken oxycodone with little relief. Chronic constipation and no result from self-administered enema 3 days ago.   Past Medical History  Diagnosis Date  . Nerve damage     to neck.  . Pain management   . Hypertension   . Diabetes mellitus   . Peripheral neuropathy   . Chronic neck pain   . Migraine headache   . Chronic nausea   . Incomplete RBBB   . Chronic abdominal pain   . History of cardiac catheterization 02/15/11 Dr. Nicki Guadalajara  . Constipation   . Anemia     after hysterectomy  . Urinary tract infection   . Ovarian cyst   . Anxiety   . Depression     on meds, helping  Hysterectomy for AUB, benign  OB History    Grav Para Term Preterm Abortions TAB SAB Ect Mult Living   2 2 2  0 0 0 0 0 0 2     # Outc Date GA Lbr Len/2nd Wgt Sex Del Anes PTL Lv   1 TRM     F SVD   Yes   2 TRM     M SVD      Comments: gst diab was on insulin      Past Surgical History  Procedure Date  . Leg surgery   . Asd repair   . Neck surgery   . Abdominal hysterectomy   . Cardiac catheterization   . Colonoscopy 05/05/2012    Procedure: COLONOSCOPY;  Surgeon: Malissa Hippo, MD;  Location: [facility];  Service: Endoscopy;  Laterality: N/A;  830    History   Social History  . Marital Status: Married    Spouse Name: N/A    Number of Children: N/A  . Years of Education: N/A   Occupational History  . Not on file.   Social History Main Topics  . Smoking status: Current Every Day Smoker -- 0.2 packs/day for 15 years    Types: Cigarettes  . Smokeless tobacco: Never Used     Comment: 5 cigarettes a day  . Alcohol Use: No  . Drug Use: No     Comment: Smokes marijuana  . Sexually Active: Yes   Birth Control/ Protection: Other-see comments, Surgical     Comment: hysterectomy   Other Topics Concern  . Not on file   Social History Narrative  . No narrative on file    No current facility-administered medications on file prior to encounter.   Current Outpatient Prescriptions on File Prior to Encounter  Medication Sig Dispense Refill  . ALPRAZolam (XANAX) 1 MG tablet Take 1 mg by mouth 2 (two) times daily.      Marland Kitchen amitriptyline (ELAVIL) 50 MG tablet Take 50 mg by mouth at bedtime.      Marland Kitchen amLODipine (NORVASC) 5 MG tablet Take 5 mg by mouth daily.      Marland Kitchen aspirin 81 MG tablet Take 81 mg by mouth daily.      . ciprofloxacin (CIPRO) 500 MG tablet Take 1 tablet (500 mg total) by mouth 2 (two) times daily.  14 tablet  0  . docusate sodium (DULCOLAX) 100 MG  capsule Take 100 mg by mouth daily.      Marland Kitchen FLUoxetine (PROZAC) 20 MG capsule Take 20 mg by mouth daily.      Marland Kitchen glyBURIDE-metformin (GLUCOVANCE) 5-500 MG per tablet 3 (three) times daily.       . metFORMIN (GLUCOPHAGE) 500 MG tablet Take 1 tablet (500 mg total) by mouth 2 (two) times daily with a meal.  60 tablet  1  . methadone (DOLOPHINE) 10 MG tablet Take 5 mg by mouth every 6 (six) hours as needed.       . metoprolol (LOPRESSOR) 100 MG tablet Take 100 mg by mouth 2 (two) times daily.      . metroNIDAZOLE (FLAGYL) 500 MG tablet Take 1 tablet (500 mg total) by mouth 2 (two) times daily.  14 tablet  0  . nitroGLYCERIN (NITROSTAT) 0.4 MG SL tablet Place 0.4 mg under the tongue every 5 (five) minutes as needed. For chest pain      . ondansetron (ZOFRAN ODT) 4 MG disintegrating tablet Take 1 tablet (4 mg total) by mouth every 8 (eight) hours as needed for nausea.  20 tablet  0  . OxyCODONE HCl, Abuse Deter, 5 MG TABA Take 5 mg by mouth 4 (four) times daily as needed.  20 tablet  0  . promethazine (PHENERGAN) 25 MG tablet Take 1 tablet (25 mg total) by mouth every 8 (eight) hours as needed for nausea.  15 tablet  0  . ramipril (ALTACE) 10 MG  capsule Take 10 mg by mouth daily.         Allergies  Allergen Reactions  . Imitrex (Sumatriptan) Swelling    States she had swelling in neck  . Penicillins Rash    ROS Pertinent items in HPI  PHYSICAL EXAM Filed Vitals:   06/10/12 1841  BP: 163/73  Pulse: 77  Temp: 97.9 F (36.6 C)  Resp: 18   General: Well nourished, well developed female in mild distress Cardiovascular: Normal rate Respiratory: Normal effort Abdomen: Soft, protruberant but ND, BS present,tenderness limited t LLQ diffuse Back: No CVAT Extremities: No edema Neurologic: Alert and oriented Speculum exam: cystocel noted, vagina with physiologic discharge, atrophic changes, no blood Bimanual exam: no adnexal masses palpate, tender in left adnexa  LAB RESULTS Results for orders placed during the hospital encounter of 06/10/12 (from the past 24 hour(s))  URINALYSIS, ROUTINE W REFLEX MICROSCOPIC     Status: Abnormal   Collection Time   06/10/12  6:45 PM      Component Value Range   Color, Urine YELLOW  YELLOW   APPearance CLEAR  CLEAR   Specific Gravity, Urine 1.010  1.005 - 1.030   pH 6.0  5.0 - 8.0   Glucose, UA >1000 (*) NEGATIVE mg/dL   Hgb urine dipstick NEGATIVE  NEGATIVE   Bilirubin Urine NEGATIVE  NEGATIVE   Ketones, ur NEGATIVE  NEGATIVE mg/dL   Protein, ur NEGATIVE  NEGATIVE mg/dL   Urobilinogen, UA 0.2  0.0 - 1.0 mg/dL   Nitrite NEGATIVE  NEGATIVE   Leukocytes, UA NEGATIVE  NEGATIVE  URINE MICROSCOPIC-ADD ON     Status: Normal   Collection Time   06/10/12  6:45 PM      Component Value Range   Squamous Epithelial / LPF RARE  RARE   WBC, UA 0-2  <3 WBC/hpf   RBC / HPF 0-2  <3 RBC/hpf   Bacteria, UA RARE  RARE    IMAGING Ct Abdomen Pelvis W Contrast  06/05/2012  *RADIOLOGY  REPORT*  Clinical Data: Left lower quadrant abdominal pain.  History of ovarian cysts.  Constipation.  Hysterectomy.  CT ABDOMEN AND PELVIS WITH CONTRAST  Technique:  Multidetector CT imaging of the abdomen and pelvis  was performed following the standard protocol during bolus administration of intravenous contrast.  Contrast: OMNIPAQUE IOHEXOL 300 MG/ML  SOLN  Comparison: 03/27/2012.  Findings: Lung Bases: Median sternotomy.  Tiny ground-glass attenuation pulmonary nodules are present in the right lower lobe (image #2, image #3).  Liver:  Low attenuation diffusely with focal fatty sparing adjacent to the gallbladder fossa.  No mass lesions.  No intrahepatic biliary ductal dilation.  Spleen:  Normal.  Gallbladder:  Normal.  No calcified stones.  Common bile duct:  Enlarged, measuring up to 1 cm, likely relating to prior passage of stones.  This appears unchanged compared to 03/27/2012.  Pancreas:  Normal.  Adrenal glands:  Normal.  Kidneys:  Normal enhancement.  Both ureters appear within normal limits.  Normal delayed excretion of contrast.  1 cm right inferior pole renal cyst.  Stomach:  Normal.  Small bowel:  Normal.  Small bowel mesentery normal.  Colon:   Normal appendix.  Large stool burden is present.  Negative for diverticulosis or diverticulitis.  Pelvic Genitourinary:  Hysterectomy.  5.9 x 4.0 cm left adnexal cyst measures slightly smaller than on the prior exam.  If this has not been evaluated on ultrasound, follow-up ultrasound is recommended. Cystocele.  Bones:  No aggressive osseous lesions.  Vasculature: Mild atherosclerosis.  No acute abnormality.  IMPRESSION:  1.  No acute abnormality. 2.  Fatty liver with focal fatty sparing adjacent to the gallbladder fossa. 3.  Stable dilation of the common bile duct measuring up to 1 cm, probably relating to prior passage of stones. 4. Slightly smaller left adnexal cystic lesion.  If not already performed, follow-up pelvic ultrasound recommended to assess the lesion. 5.  Small ground-glass attenuation pulmonary nodules in the right lower lobe.  The appearance is nonspecific but potentially this represents small foci of bronchopneumonia.  Initial follow-up by chest CT  without contrast is recommended in 3 months to confirm persistence.   This recommendation follows the consensus statement: Recommendations for the Management of Subsolid Pulmonary Nodules Detected at CT:  A Statement from the Fleischner Society as published in Radiology 2013; 266:304-317.   Original Report Authenticated By: Andreas Newport, M.D.    Dg Abd Acute W/chest  06/01/2012  *RADIOLOGY REPORT*  Clinical Data: Left-sided abdominal pain, vomiting and constipation.  ACUTE ABDOMEN SERIES (ABDOMEN 2 VIEW & CHEST 1 VIEW)  Comparison: Chest and abdominal radiographs performed 04/17/2012  Findings: The lungs are well-aerated.  Minimal left basilar atelectasis is noted.  There is no evidence of pleural effusion or pneumothorax.  The cardiomediastinal silhouette is normal in size; the patient is status post median sternotomy.  The visualized bowel gas pattern is unremarkable.  Scattered stool and air are seen within the colon; there is no evidence of small bowel dilatation to suggest obstruction.  No free intra-abdominal air is identified on the provided upright view.  No acute osseous abnormalities are seen; the sacroiliac joints are unremarkable in appearance.  IMPRESSION:  1.  Unremarkable bowel gas pattern; no free intra-abdominal air seen.  Relatively large amount of stool within the colon could reflect very mild constipation, though the sigmoid colon is partially decompressed. 2.  Minimal left basilar atelectasis noted; lungs otherwise clear.   Original Report Authenticated By: Tonia Ghent, M.D.     MAU  COURSE TC to Dr. Rana Snare at 2000> message left to call for advice on POC. Declines Toradol offered for pain Care assumed by [facility] at 2020  Danae Orleans, CNM 06/10/2012 7:54 PM  Pt reports improvement in pain after given Dilaudid and desires discharge home.  [facility]  Ultrasound: IMPRESSION:  Two cystic lesions within the left ovary as described above.  Recommend follow-up  ultrasound in 6-8 weeks.  ASSESSMENT  Pelvic Pain Ovarian Cyst  PLAN DC Home Continue pain medication. Keep scheduled appointment for Monday. [facility]

## 2012-06-12 DIAGNOSIS — R1909 Other intra-abdominal and pelvic swelling, mass and lump: Secondary | ICD-10-CM | POA: Diagnosis not present

## 2012-06-12 DIAGNOSIS — N83209 Unspecified ovarian cyst, unspecified side: Secondary | ICD-10-CM | POA: Diagnosis not present

## 2012-06-12 DIAGNOSIS — N949 Unspecified condition associated with female genital organs and menstrual cycle: Secondary | ICD-10-CM | POA: Diagnosis not present

## 2012-06-26 ENCOUNTER — Encounter (HOSPITAL_COMMUNITY): Payer: Self-pay | Admitting: Pharmacist

## 2012-06-27 ENCOUNTER — Other Ambulatory Visit: Payer: Self-pay | Admitting: Obstetrics and Gynecology

## 2012-06-27 NOTE — H&P (Signed)
KYNLI CHOU  DICTATION #  CSN# [id number]   Meriel Pica, MD 06/27/2012 9:25 AM

## 2012-06-27 NOTE — H&P (Signed)
NAMETHOMASA, HEIDLER                  ACCOUNT NO.:  [id number]  MEDICAL RECORD NO.:  [id number]  LOCATION:                                 FACILITY:  PHYSICIAN:  Duke Salvia. Marcelle Overlie, M.D.DATE OF BIRTH:  [birth year]  DATE OF ADMISSION: DATE OF DISCHARGE:                             HISTORY & PHYSICAL   CHIEF COMPLAINT:  Pelvic pain.  HISTORY OF PRESENT ILLNESS:  A 52 year old, G2, P2, prior vaginal hysterectomy for bleeding in 1994.  I saw her recently for the 1st time with a history in the last 7-8 months complaining of left lower quadrant pain.  Initially, was thought to potentially have diverticular disease. She had a CT scan October 2013, and an followup recently done in [place], June 13, 2012, the most recent showing no acute abnormality, focal fatty liver with a slightly smaller left adnexal cyst.  They recommended followup ultrasound.  In the interim, she has had visits to the ED with complaints of pelvic pain, and ultrasound done at [facility] recently this showed left ovarian cyst 6.3 x 4.0 x 4.6, no free fluid.  The patient is under chronic pain management currently on methadone with seeing Dr. Venia Carbon on number of other medications that will be listed.  After reviewing her imaging studies and examination, her pain has been to the point that she would prefer to proceed with definitive BSO.  CA-125 was normal at 8.4.  She presents at this time for laparoscopy with possible laparoscopic LSO, possible laparotomy with BSO, will receive preop mechanical bowel prep.  This procedure including risks related to bleeding, infection, transfusion, adjacent organ injury, the possible need to complete surgery by open technique along with her recovery time, possible need for ERT discussed with her also.  PAST MEDICAL HISTORY:  Allergies; IMITREX, TOPROL.  CURRENT MEDICATIONS:  Amitriptyline, oxycodone p.r.n., Nitrostat, metoprolol, Bayer, Ondansetron, glyburide, metformin,  fluoxetine, metronidazole, Xanax, methadone, ramipril, Abilify, Dulcolax p.r.n.  MEDICAL HISTORY:  Significant for a prior open-heart surgery with ASD repair in 1989.  She has had neck surgery, numerous nerve blocks of the leg surgery, 2 vaginal deliveries, vaginal hysterectomy.  SOCIAL HISTORY:  Denies alcohol or other drug use, other than her prescription medications.  She is a smoker.  Dr. Sherwood Gambler  is her medical doctor and Dr. Vear Clock helps with her pain management.  PHYSICAL EXAMINATION:  VITAL SIGNS:  Temperature 98.2, blood pressure 130/72. HEENT:  Unremarkable. NECK:  Supple without masses. LUNGS:  Clear. CARDIOVASCULAR:  Regular rate and rhythm without murmurs, rubs, or gallops. BREASTS:  Without masses. ABDOMEN:  Soft, flat, nontender. PELVIC:  Normal external genitalia.  Vaginal cuff clear.  Bimanual slightly tender on the left.  No definite mass palpable. EXTREMITIES:  Unremarkable. NEUROLOGIC:  Unremarkable.  IMPRESSION:  Left ovarian cyst, chronic pelvic pain.  PLAN:  Laparoscopy with BSO procedure and risks discussed as above.  We will obtain preoperative anesthesia and medical clearance.     Parisha Beaulac M. Marcelle Overlie, M.D.     [facility]/MEDQ  D:  06/27/2012  T:  06/27/2012  Job:  [id number]

## 2012-06-28 ENCOUNTER — Other Ambulatory Visit (HOSPITAL_COMMUNITY): Payer: Medicare Other

## 2012-06-29 ENCOUNTER — Encounter (HOSPITAL_COMMUNITY)
Admission: RE | Admit: 2012-06-29 | Discharge: 2012-06-29 | Disposition: A | Discharge status: Home / Self Care | Payer: Medicare Other | Source: Ambulatory Visit | Attending: Obstetrics and Gynecology | Admitting: Obstetrics and Gynecology

## 2012-06-29 ENCOUNTER — Encounter (HOSPITAL_COMMUNITY): Payer: Self-pay

## 2012-06-29 DIAGNOSIS — N840 Polyp of corpus uteri: Secondary | ICD-10-CM | POA: Diagnosis not present

## 2012-06-29 DIAGNOSIS — N83209 Unspecified ovarian cyst, unspecified side: Secondary | ICD-10-CM | POA: Diagnosis not present

## 2012-06-29 DIAGNOSIS — Z01812 Encounter for preprocedural laboratory examination: Secondary | ICD-10-CM | POA: Diagnosis not present

## 2012-06-29 DIAGNOSIS — Z01818 Encounter for other preprocedural examination: Secondary | ICD-10-CM | POA: Diagnosis not present

## 2012-06-29 DIAGNOSIS — N949 Unspecified condition associated with female genital organs and menstrual cycle: Secondary | ICD-10-CM | POA: Diagnosis not present

## 2012-06-29 HISTORY — DX: Angina pectoris, unspecified: I20.9

## 2012-06-29 HISTORY — DX: Atrial septal defect: Q21.1

## 2012-06-29 HISTORY — DX: Atherosclerotic heart disease of native coronary artery without angina pectoris: I25.10

## 2012-06-29 LAB — BASIC METABOLIC PANEL
Calcium: 9.5 mg/dL (ref 8.4–10.5)
GFR calc Af Amer: >90 mL/min (ref >90)
GFR calc non Af Amer: >90 mL/min (ref >90)
Glucose, Bld: 200 mg/dL — ABNORMAL HIGH (ref 70–99)
Potassium: 4.3 mEq/L (ref 3.5–5.1)
Sodium: 138 mEq/L (ref 135–145)

## 2012-06-29 LAB — CBC
Hemoglobin: 15.1 g/dL — ABNORMAL HIGH (ref 12.0–15.0)
MCH: 29.7 pg (ref 26.0–34.0)
Platelets: 223 10*3/uL (ref 150–400)
RBC: 5.08 MIL/uL (ref 3.87–5.11)
WBC: 6.2 10*3/uL (ref 4.0–10.5)

## 2012-06-29 LAB — SURGICAL PCR SCREEN: MRSA, PCR: NEGATIVE

## 2012-06-29 NOTE — Pre-Procedure Instructions (Signed)
Reviewed patient's history, meds, ekg copy and clearance for surgery with Dr Arby Barrette.  Ok for surgery on 07/03/12.

## 2012-06-29 NOTE — Patient Instructions (Signed)
   Your procedure is scheduled on: Monday, Feb 3  Enter through the [employer] [facility] at: [employer] up the phone at the desk and dial [phone] and inform us of your arrival.  Please call this number if you have any problems the morning of surgery: [phone]  Remember: Do not eat food after midnight: Sunday Do not drink clear liquids after: midnight Sunday Take these medicines the morning of surgery with a SIP OF WATER:  Metoprolol, amlodipine, ramipril, zofran ODT, Xanax if needed, methadone.  Patient to withhold Metformin Sunday night and Monday morning's dose.  Do not wear jewelry, make-up, or FINGER nail polish No metal in your hair or on your body. Do not wear lotions, powders, perfumes. You may wear deodorant.  Please use your CHG wash as directed prior to surgery.  Do not shave anywhere for at least 12 hours prior to first CHG shower.  Do not bring valuables to the hospital. Contacts, dentures or bridgework may not be worn into surgery.  Leave suitcase in the car. After Surgery it may be brought to your room. For patients being admitted to the hospital, checkout time is 11:00am the day of discharge.  Patients discharged on the day of surgery will not be allowed to drive home.   Home with husband [employer].

## 2012-07-01 HISTORY — PX: ABDOMINAL SURGERY: SHX537

## 2012-07-02 MED ORDER — GENTAMICIN SULFATE 40 MG/ML IJ SOLN
INTRAVENOUS | Status: AC
  Administered 2012-07-03: 07:00:00 via INTRAVENOUS
  Filled 2012-07-02: qty 7.75

## 2012-07-03 ENCOUNTER — Encounter (HOSPITAL_COMMUNITY): Payer: Self-pay | Admitting: Anesthesiology

## 2012-07-03 ENCOUNTER — Encounter (HOSPITAL_COMMUNITY)
Admission: RE | Disposition: A | Discharge status: Home / Self Care | Payer: Self-pay | Source: Ambulatory Visit | Attending: Obstetrics and Gynecology

## 2012-07-03 ENCOUNTER — Ambulatory Visit (HOSPITAL_COMMUNITY)
Admission: RE | Admit: 2012-07-03 | Discharge: 2012-07-03 | Disposition: A | Discharge status: Home / Self Care | Payer: Medicare Other | Source: Ambulatory Visit | Attending: Obstetrics and Gynecology | Admitting: Obstetrics and Gynecology

## 2012-07-03 ENCOUNTER — Ambulatory Visit (HOSPITAL_COMMUNITY): Payer: Medicare Other | Admitting: Anesthesiology

## 2012-07-03 DIAGNOSIS — Z01812 Encounter for preprocedural laboratory examination: Secondary | ICD-10-CM | POA: Insufficient documentation

## 2012-07-03 DIAGNOSIS — D279 Benign neoplasm of unspecified ovary: Secondary | ICD-10-CM | POA: Diagnosis not present

## 2012-07-03 DIAGNOSIS — E119 Type 2 diabetes mellitus without complications: Secondary | ICD-10-CM | POA: Diagnosis not present

## 2012-07-03 DIAGNOSIS — N83209 Unspecified ovarian cyst, unspecified side: Secondary | ICD-10-CM | POA: Diagnosis not present

## 2012-07-03 DIAGNOSIS — Z01818 Encounter for other preprocedural examination: Secondary | ICD-10-CM | POA: Diagnosis not present

## 2012-07-03 DIAGNOSIS — N840 Polyp of corpus uteri: Secondary | ICD-10-CM | POA: Diagnosis not present

## 2012-07-03 DIAGNOSIS — N949 Unspecified condition associated with female genital organs and menstrual cycle: Secondary | ICD-10-CM | POA: Insufficient documentation

## 2012-07-03 DIAGNOSIS — D649 Anemia, unspecified: Secondary | ICD-10-CM | POA: Diagnosis not present

## 2012-07-03 HISTORY — PX: SALPINGOOPHORECTOMY: SHX82

## 2012-07-03 HISTORY — PX: LAPAROSCOPY: SHX197

## 2012-07-03 LAB — GLUCOSE, CAPILLARY: Glucose-Capillary: 168 mg/dL — ABNORMAL HIGH (ref 70–99)

## 2012-07-03 SURGERY — LAPAROSCOPY OPERATIVE
Anesthesia: General | Site: Abdomen | Wound class: Clean

## 2012-07-03 MED ORDER — MIDAZOLAM HCL 2 MG/2ML IJ SOLN
INTRAMUSCULAR | Status: AC
  Filled 2012-07-03: qty 2

## 2012-07-03 MED ORDER — FENTANYL CITRATE 0.05 MG/ML IJ SOLN
INTRAMUSCULAR | Status: DC | PRN
  Administered 2012-07-03 (×2): 100 ug via INTRAVENOUS

## 2012-07-03 MED ORDER — LIDOCAINE HCL (CARDIAC) 20 MG/ML IV SOLN
INTRAVENOUS | Status: DC | PRN
  Administered 2012-07-03: 60 mg via INTRAVENOUS

## 2012-07-03 MED ORDER — ROCURONIUM BROMIDE 50 MG/5ML IV SOLN
INTRAVENOUS | Status: AC
  Filled 2012-07-03: qty 1

## 2012-07-03 MED ORDER — FENTANYL CITRATE 0.05 MG/ML IJ SOLN: 25.0000 ug | INTRAMUSCULAR | Status: DC | PRN

## 2012-07-03 MED ORDER — ONDANSETRON HCL 4 MG/2ML IJ SOLN
INTRAMUSCULAR | Status: AC
  Administered 2012-07-03: 4 mg via INTRAVENOUS
  Filled 2012-07-03: qty 2

## 2012-07-03 MED ORDER — FENTANYL CITRATE 0.05 MG/ML IJ SOLN
INTRAMUSCULAR | Status: AC
  Filled 2012-07-03: qty 5

## 2012-07-03 MED ORDER — BUPIVACAINE HCL (PF) 0.25 % IJ SOLN
INTRAMUSCULAR | Status: DC | PRN
  Administered 2012-07-03: 15 mL

## 2012-07-03 MED ORDER — KETOROLAC TROMETHAMINE 30 MG/ML IJ SOLN: 15.0000 mg | Freq: Once | INTRAMUSCULAR | Status: DC | PRN

## 2012-07-03 MED ORDER — KETOROLAC TROMETHAMINE 30 MG/ML IJ SOLN
INTRAMUSCULAR | Status: DC | PRN
  Administered 2012-07-03: 30 mg via INTRAVENOUS

## 2012-07-03 MED ORDER — KETOROLAC TROMETHAMINE 30 MG/ML IJ SOLN
INTRAMUSCULAR | Status: AC
  Filled 2012-07-03: qty 1

## 2012-07-03 MED ORDER — GLYCOPYRROLATE 0.2 MG/ML IJ SOLN
INTRAMUSCULAR | Status: AC
  Filled 2012-07-03: qty 1

## 2012-07-03 MED ORDER — ONDANSETRON HCL 4 MG/2ML IJ SOLN
INTRAMUSCULAR | Status: DC | PRN
  Administered 2012-07-03: 4 mg via INTRAVENOUS

## 2012-07-03 MED ORDER — MIDAZOLAM HCL 2 MG/2ML IJ SOLN: 0.5000 mg | Freq: Once | INTRAMUSCULAR | Status: DC | PRN

## 2012-07-03 MED ORDER — GLYCOPYRROLATE 0.2 MG/ML IJ SOLN
INTRAMUSCULAR | Status: DC | PRN
  Administered 2012-07-03: .5 mg via INTRAVENOUS

## 2012-07-03 MED ORDER — LIDOCAINE HCL (CARDIAC) 20 MG/ML IV SOLN
INTRAVENOUS | Status: AC
  Filled 2012-07-03: qty 5

## 2012-07-03 MED ORDER — ACETAMINOPHEN 10 MG/ML IV SOLN
1000.0000 mg | Freq: Once | INTRAVENOUS | Status: DC
  Filled 2012-07-03: qty 100

## 2012-07-03 MED ORDER — DEXAMETHASONE SODIUM PHOSPHATE 10 MG/ML IJ SOLN
INTRAMUSCULAR | Status: AC
  Filled 2012-07-03: qty 1

## 2012-07-03 MED ORDER — 0.9 % SODIUM CHLORIDE (POUR BTL) OPTIME
TOPICAL | Status: DC | PRN
  Administered 2012-07-03: 1000 mL

## 2012-07-03 MED ORDER — ROCURONIUM BROMIDE 100 MG/10ML IV SOLN
INTRAVENOUS | Status: DC | PRN
  Administered 2012-07-03 (×3): 10 mg via INTRAVENOUS

## 2012-07-03 MED ORDER — PROMETHAZINE HCL 25 MG/ML IJ SOLN
INTRAMUSCULAR | Status: AC
  Administered 2012-07-03: 25 mg via INTRAVENOUS
  Filled 2012-07-03: qty 1

## 2012-07-03 MED ORDER — ACETAMINOPHEN 10 MG/ML IV SOLN
INTRAVENOUS | Status: AC
  Administered 2012-07-03: 1000 mg via INTRAVENOUS
  Filled 2012-07-03: qty 100

## 2012-07-03 MED ORDER — MIDAZOLAM HCL 5 MG/5ML IJ SOLN
INTRAMUSCULAR | Status: DC | PRN
  Administered 2012-07-03: 2 mg via INTRAVENOUS

## 2012-07-03 MED ORDER — LACTATED RINGERS IV SOLN
INTRAVENOUS | Status: DC
  Administered 2012-07-03: 125 mL/h via INTRAVENOUS
  Administered 2012-07-03 (×2): via INTRAVENOUS

## 2012-07-03 MED ORDER — PROPOFOL 10 MG/ML IV BOLUS
INTRAVENOUS | Status: DC | PRN
  Administered 2012-07-03: 180 mg via INTRAVENOUS

## 2012-07-03 MED ORDER — PROMETHAZINE HCL 25 MG/ML IJ SOLN: 6.2500 mg | INTRAMUSCULAR | Status: DC | PRN

## 2012-07-03 MED ORDER — MEPERIDINE HCL 25 MG/ML IJ SOLN: 6.2500 mg | INTRAMUSCULAR | Status: DC | PRN

## 2012-07-03 MED ORDER — BUPIVACAINE HCL (PF) 0.25 % IJ SOLN
INTRAMUSCULAR | Status: AC
  Filled 2012-07-03: qty 30

## 2012-07-03 MED ORDER — SODIUM CHLORIDE 0.9 % IJ SOLN
INTRAMUSCULAR | Status: DC | PRN
  Administered 2012-07-03: 10 mL

## 2012-07-03 MED ORDER — NEOSTIGMINE METHYLSULFATE 1 MG/ML IJ SOLN
INTRAMUSCULAR | Status: DC | PRN
  Administered 2012-07-03: 2.5 mg via INTRAVENOUS

## 2012-07-03 MED ORDER — SUCCINYLCHOLINE CHLORIDE 20 MG/ML IJ SOLN
INTRAMUSCULAR | Status: DC | PRN
  Administered 2012-07-03: 100 mg via INTRAVENOUS

## 2012-07-03 MED ORDER — SCOPOLAMINE 1 MG/3DAYS TD PT72
MEDICATED_PATCH | TRANSDERMAL | Status: AC
  Administered 2012-07-03: 1.5 mg
  Filled 2012-07-03: qty 1

## 2012-07-03 MED ORDER — PROPOFOL 10 MG/ML IV EMUL
INTRAVENOUS | Status: AC
  Filled 2012-07-03: qty 20

## 2012-07-03 MED ORDER — ONDANSETRON HCL 4 MG/2ML IJ SOLN
INTRAMUSCULAR | Status: AC
  Filled 2012-07-03: qty 2

## 2012-07-03 MED ORDER — NEOSTIGMINE METHYLSULFATE 1 MG/ML IJ SOLN
INTRAMUSCULAR | Status: AC
  Filled 2012-07-03: qty 1

## 2012-07-03 SURGICAL SUPPLY — 55 items
ADH SKN CLS APL DERMABOND .7 (GAUZE/BANDAGES/DRESSINGS) ×3
BAG SPEC RTRVL LRG 6X4 10 (ENDOMECHANICALS)
CABLE HIGH FREQUENCY MONO STRZ (ELECTRODE) IMPLANT
CANISTER SUCTION 2500CC (MISCELLANEOUS) ×4 IMPLANT
CATH KIT ON Q 5IN DUAL SLV (PAIN MANAGEMENT) IMPLANT
CATH ROBINSON RED A/P 16FR (CATHETERS) ×4 IMPLANT
CLOTH BEACON ORANGE TIMEOUT ST (SAFETY) ×4 IMPLANT
CONTAINER PREFILL 10% NBF 60ML (FORM) IMPLANT
DECANTER SPIKE VIAL GLASS SM (MISCELLANEOUS) IMPLANT
DERMABOND ADVANCED (GAUZE/BANDAGES/DRESSINGS) ×1
DERMABOND ADVANCED .7 DNX12 (GAUZE/BANDAGES/DRESSINGS) ×3 IMPLANT
DRESSING TELFA 8X3 (GAUZE/BANDAGES/DRESSINGS) ×4 IMPLANT
DRSG COVADERM PLUS 2X2 (GAUZE/BANDAGES/DRESSINGS) ×2 IMPLANT
DRSG TEGADERM 2.38X2.75 (GAUZE/BANDAGES/DRESSINGS) IMPLANT
DRSG TEGADERM 4X4.75 (GAUZE/BANDAGES/DRESSINGS) ×2 IMPLANT
GAUZE SPONGE 4X4 12PLY STRL LF (GAUZE/BANDAGES/DRESSINGS) ×8 IMPLANT
GLOVE BIO SURGEON STRL SZ7 (GLOVE) ×8 IMPLANT
GLOVE BIOGEL PI IND STRL 6.5 (GLOVE) IMPLANT
GLOVE BIOGEL PI INDICATOR 6.5 (GLOVE)
GOWN PREVENTION PLUS LG XLONG (DISPOSABLE) ×12 IMPLANT
NDL HYPO 25X1 1.5 SAFETY (NEEDLE) IMPLANT
NDL INSUFFLATION 14GA 120MM (NEEDLE) ×2 IMPLANT
NEEDLE HYPO 25X1 1.5 SAFETY (NEEDLE) IMPLANT
NEEDLE INSUFFLATION 14GA 120MM (NEEDLE) ×4 IMPLANT
NS IRRIG 1000ML POUR BTL (IV SOLUTION) ×4 IMPLANT
PACK ABDOMINAL GYN (CUSTOM PROCEDURE TRAY) ×2 IMPLANT
PACK LAPAROSCOPY BASIN (CUSTOM PROCEDURE TRAY) ×4 IMPLANT
PAD OB MATERNITY 4.3X12.25 (PERSONAL CARE ITEMS) ×4 IMPLANT
POUCH SPECIMEN RETRIEVAL 10MM (ENDOMECHANICALS) IMPLANT
PROTECTOR NERVE ULNAR (MISCELLANEOUS) ×4 IMPLANT
SEALER TISSUE G2 CVD JAW 35 (ENDOMECHANICALS) IMPLANT
SEALER TISSUE G2 CVD JAW 45CM (ENDOMECHANICALS) ×2 IMPLANT
SET IRRIG TUBING LAPAROSCOPIC (IRRIGATION / IRRIGATOR) IMPLANT
SPONGE GAUZE 2X2 8PLY STRL LF (GAUZE/BANDAGES/DRESSINGS) ×2 IMPLANT
SPONGE LAP 18X18 X RAY DECT (DISPOSABLE) ×4 IMPLANT
STRIP CLOSURE SKIN 1/4X4 (GAUZE/BANDAGES/DRESSINGS) ×2 IMPLANT
SUT CHROMIC 0 CT 1 (SUTURE) ×2 IMPLANT
SUT MON AB 2-0 CT1 36 (SUTURE) ×6 IMPLANT
SUT MON AB 4-0 PS1 27 (SUTURE) ×4 IMPLANT
SUT PDS AB 0 CT1 27 (SUTURE) ×4 IMPLANT
SUT VIC AB 0 CT1 18XCR BRD8 (SUTURE) ×2 IMPLANT
SUT VIC AB 0 CT1 8-18 (SUTURE)
SUT VIC AB 2-0 CT1 18 (SUTURE) ×4 IMPLANT
SUT VIC AB 3-0 CT1 27 (SUTURE)
SUT VIC AB 3-0 CT1 TAPERPNT 27 (SUTURE) ×2 IMPLANT
SUT VICRYL 0 TIES 12 18 (SUTURE) ×2 IMPLANT
SUT VICRYL 2 0 UR 5 (SUTURE) ×2 IMPLANT
SUT VICRYL RAPIDE 4/0 PS 2 (SUTURE) ×6 IMPLANT
SYR CONTROL 10ML LL (SYRINGE) IMPLANT
TOWEL OR 17X24 6PK STRL BLUE (TOWEL DISPOSABLE) ×8 IMPLANT
TRAY FOLEY CATH 14FR (SET/KITS/TRAYS/PACK) ×2 IMPLANT
TROCAR Z-THREAD BLADED 11X100M (TROCAR) ×4 IMPLANT
TROCAR Z-THREAD BLADED 5X100MM (TROCAR) ×8 IMPLANT
WARMER LAPAROSCOPE (MISCELLANEOUS) ×4 IMPLANT
WATER STERILE IRR 1000ML POUR (IV SOLUTION) ×4 IMPLANT
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
## 2012-07-03 NOTE — Progress Notes (Signed)
The patient was re-examined with no change in statusThe patient was re-examined with no change in status 

## 2012-07-03 NOTE — Anesthesia Procedure Notes (Signed)
Procedure Name: Intubation Date/Time: 07/03/2012 7:38 AM Performed by: Maris Berger T Pre-anesthesia Checklist: Patient identified, Emergency Drugs available, Suction available and Timeout performed Patient Re-evaluated:Patient Re-evaluated prior to inductionOxygen Delivery Method: Circle system utilized Preoxygenation: Pre-oxygenation with 100% oxygen Intubation Type: IV induction Ventilation: Mask ventilation without difficulty Grade View: Grade I Tube type: Oral Tube size: 7.0 mm Number of attempts: 1 Airway Equipment and Method: Video-laryngoscopy and Stylet Placement Confirmation: ETT inserted through vocal cords under direct vision,  positive ETCO2 and breath sounds checked- equal and bilateral Secured at: 21 cm Tube secured with: Tape Dental Injury: Teeth and Oropharynx as per pre-operative assessment  Difficulty Due To: Difficult Airway- due to reduced neck mobility Comments: Pt with limited mobility to neck and chronic pain,  Head and Neck positioned to comfort prior to induction.  easy induction and Intubated using glidescope with excellent view. Head and neck in neutral position throughout.

## 2012-07-03 NOTE — Transfer of Care (Signed)
Immediate Anesthesia Transfer of Care Note  Patient: Anita [name]  Procedure(s) Performed: Procedure(s) (LRB) with comments: LAPAROSCOPY OPERATIVE (N/A) SALPINGO OOPHORECTOMY (Bilateral)  Patient Location: PACU  Anesthesia Type:General  Level of Consciousness: sedated and responds to stimulation  Airway & Oxygen Therapy: Patient Spontanous Breathing and Patient connected to nasal cannula oxygen  Post-op Assessment:report given to pacu nurse  Post vital signs: Reviewed and stable  Complications: No apparent anesthesia complications

## 2012-07-03 NOTE — Op Note (Signed)
Preoperative diagnosis: Pelvic pain, left adnexal cyst  Postoperative diagnosis: Same  Procedure: Diagnostic laparoscopy with lysis of adhesions, BSO  Surgeon: Marcelle Overlie  Anesthesia: Gen.  EBL: 50 cc  Specimens removed: Bilateral tubes and ovaries, to pathology  Complications: None  Procedure and findings:  Patient taken the operating room after appropriate timeout for taken she was prepped and draped in usual fashion for laparoscopy the bladder was drained. Subumbilical area was infiltrated with quarter percent Marcaine plain, small incision was made in the varies needle was introduced without difficulty. Its intra-abdominal position was verified by pressure water testing. After a 3 L pneumoperitoneum syncopated lap scopic trocar and sleeve were then introduced. There was no evidence of any bleeding or trauma. 3 finger breaths above the symphysis in the midline a 5 mm trocar was inserted under direct visualization the pelvic findings as follows  The uterus is surgically absent the right tube and ovary had some minimal filmy adhesions but were normal the left ovary was enlarged approximately 5 cm smooth-walled cyst there were no excrescences the cul-de-sac was free and clear. Grasping forcep was then used to graft the right tube and ovary placed on traction toward the midline the in seal device was then used to coagulate and divide the right IP ligament dissecting free the right tube and ovary the course of the right ureter was noted be well below. This was hemostatic starting on the left the same was repeated after identifying the ureter well below, left IP ligament was dissected free with the in seal device. The average then placed in the cul-de-sac temporarily the lower incision was enlarged slightly, Kelly clamp was placed through the fascia of the left ovary with the cyst was pulled up to the anterior abdominal wall and a 20-gauge spinal needle was used to deflate the cyst was some darkish  brown fluid consistent with probable corpus luteum cyst. Once the cyst was deflated the left tube and ovary were removed and the incision sent to pathology. Similarly, the right tube and ovary was removed. The operative sites were inspected and noted be hemostatic prior to closure sponge denies precast reported as correct x2 WAS allowed to escape the lower incision fascia was closed with 2-0 Vicryl. 4-0 Monocryl PDS 1 for the subcuticular closure at both sites. She did receive Toradol IV went to recovery room in good condition.  Dictated with [facility]  Blondie Riggsbee M. Milana Obey.D.

## 2012-07-04 ENCOUNTER — Encounter (HOSPITAL_COMMUNITY): Payer: Self-pay | Admitting: Obstetrics and Gynecology

## 2012-07-12 ENCOUNTER — Encounter (INDEPENDENT_AMBULATORY_CARE_PROVIDER_SITE_OTHER): Payer: Self-pay | Admitting: *Deleted

## 2012-07-14 NOTE — Anesthesia Postprocedure Evaluation (Signed)
  Anesthesia Post-op Note  Patient: Anita [name]  Procedure(s) Performed: Procedure(s): LAPAROSCOPY OPERATIVE (N/A) SALPINGO OOPHORECTOMY (Bilateral) Patient is awake and responsive. Pain and nausea are reasonably well controlled. Vital signs are stable and clinically acceptable. Oxygen saturation is clinically acceptable. There are no apparent anesthetic complications at this time. Patient is ready for discharge.

## 2012-07-18 DIAGNOSIS — Z09 Encounter for follow-up examination after completed treatment for conditions other than malignant neoplasm: Secondary | ICD-10-CM | POA: Diagnosis not present

## 2012-07-18 DIAGNOSIS — N390 Urinary tract infection, site not specified: Secondary | ICD-10-CM | POA: Diagnosis not present

## 2012-07-18 DIAGNOSIS — R350 Frequency of micturition: Secondary | ICD-10-CM | POA: Diagnosis not present

## 2012-08-15 ENCOUNTER — Ambulatory Visit (INDEPENDENT_AMBULATORY_CARE_PROVIDER_SITE_OTHER): Payer: Medicare Other | Admitting: Internal Medicine

## 2012-08-22 ENCOUNTER — Ambulatory Visit (INDEPENDENT_AMBULATORY_CARE_PROVIDER_SITE_OTHER): Payer: Medicare Other | Admitting: Internal Medicine

## 2012-08-22 ENCOUNTER — Encounter (INDEPENDENT_AMBULATORY_CARE_PROVIDER_SITE_OTHER): Payer: Self-pay | Admitting: Internal Medicine

## 2012-08-22 VITALS — BP 140/70 | HR 68 | Temp 98.3°F | Resp 18 | Ht 65.0 in | Wt 160.3 lb

## 2012-08-22 DIAGNOSIS — K5900 Constipation, unspecified: Secondary | ICD-10-CM | POA: Diagnosis not present

## 2012-08-22 DIAGNOSIS — R1032 Left lower quadrant pain: Secondary | ICD-10-CM | POA: Diagnosis not present

## 2012-08-22 MED ORDER — DOCUSATE SODIUM 100 MG PO CAPS: 200.0000 mg | ORAL_CAPSULE | Freq: Every day | ORAL | Status: DC

## 2012-08-22 NOTE — Patient Instructions (Addendum)
Increase fiber in your diet; examples include apples and prunes.

## 2012-08-22 NOTE — Progress Notes (Signed)
Presenting complaint;  Constipation and abdominal pain.  Subjective:  Patient is 52 year old Caucasian female who is here for scheduled visit accompanied by her husband. She has history of left-sided abdominal pain constipation and hematochezia. Colonoscopy was attempted in December 2013 but could not be completed as the colon was full of solid stool. On 07/03/2012 she had diagnostic laparoscopy with lysis of adhesions and BSO. She had large tubo-ovarian cyst removed. She feels a lot better. Only time she experiences pain is when she is constipated. Her bowels move daily or every other day. She has noted blood in the tissue on few occasions. Her appetite has improved and she has gained 6 pounds. She still has intermittent emesis which is usually triggered by her pain/headache. She denies hematemesis. She is trying to become physically more active and starting to walk some.  Current Medications: Current Outpatient Prescriptions  Medication Sig Dispense Refill  . ALPRAZolam (XANAX) 1 MG tablet Take 1 mg by mouth 2 (two) times daily.      Marland Kitchen amitriptyline (ELAVIL) 50 MG tablet Take 50 mg by mouth at bedtime.      Marland Kitchen amLODipine (NORVASC) 5 MG tablet Take 5 mg by mouth daily.      Marland Kitchen aspirin 81 MG tablet Take 81 mg by mouth daily.      Marland Kitchen docusate sodium (DULCOLAX) 100 MG capsule Take 100 mg by mouth daily.      Marland Kitchen FLUoxetine (PROZAC) 20 MG capsule Take 20 mg by mouth daily.      Marland Kitchen glyBURIDE-metformin (GLUCOVANCE) 5-500 MG per tablet Take 1 tablet by mouth 3 (three) times daily.       . methadone (DOLOPHINE) 10 MG tablet Take 5 mg by mouth every 6 (six) hours as needed.       . metoprolol (LOPRESSOR) 100 MG tablet Take 100 mg by mouth 2 (two) times daily.      . nitroGLYCERIN (NITROSTAT) 0.4 MG SL tablet Place 0.4 mg under the tongue every 5 (five) minutes as needed. For chest pain      . OxyCODONE HCl, Abuse Deter, 5 MG TABA Take 5 mg by mouth 4 (four) times daily as needed.  20 tablet  0  .  polyethylene glycol (MIRALAX / GLYCOLAX) packet Take 17 g by mouth daily.      . promethazine (PHENERGAN) 25 MG tablet Take 1 tablet (25 mg total) by mouth every 8 (eight) hours as needed for nausea.  15 tablet  0  . ramipril (ALTACE) 10 MG capsule Take 10 mg by mouth daily.       . saxagliptin HCl (ONGLYZA) 5 MG TABS tablet Take 5 mg by mouth daily.       No current facility-administered medications for this visit.     Objective: Blood pressure 140/70, pulse 68, temperature 98.3 F (36.8 C), temperature source Oral, resp. rate 18, height 5\' 5"  (1.651 m), weight 160 lb 4.8 oz (72.712 kg). Patient is alert and in no acute distress. Conjunctiva is pink. Sclera is nonicteric Oropharyngeal mucosa is normal. No neck masses or thyromegaly noted. Abdomen abdomen is symmetrical. Soft and nontender without organomegaly or masses. No LE edema or clubbing noted.   Assessment:  #1. Chronic constipation. It is secondary to her medications. She is doing well with therapy. #2. LLQ abdominal pain. Pain has decreased significantly since she had surgery with removal of ovaries and tubes revealing cyst adenomas. Some of her pain appears to be colonic as she has more pain when she is  constipated. #3. Hematochezia most likely secondary to hemorrhoids. Last colonoscopy aborted because of poor prep.  Plan:  Take 2 Colace capsules by mouth daily. Take polyethylene glycol 17 g by mouth daily. Continue high fiber diet. Office visit in 6 months. Colonoscopy would be scheduled following her next visit.

## 2012-08-24 DIAGNOSIS — M531 Cervicobrachial syndrome: Secondary | ICD-10-CM | POA: Diagnosis not present

## 2012-08-24 DIAGNOSIS — R1033 Periumbilical pain: Secondary | ICD-10-CM | POA: Diagnosis not present

## 2012-08-24 DIAGNOSIS — Z79899 Other long term (current) drug therapy: Secondary | ICD-10-CM | POA: Diagnosis not present

## 2012-08-24 DIAGNOSIS — G894 Chronic pain syndrome: Secondary | ICD-10-CM | POA: Diagnosis not present

## 2012-08-24 DIAGNOSIS — M47812 Spondylosis without myelopathy or radiculopathy, cervical region: Secondary | ICD-10-CM | POA: Diagnosis not present

## 2012-09-11 ENCOUNTER — Other Ambulatory Visit (HOSPITAL_COMMUNITY): Payer: Self-pay | Admitting: Internal Medicine

## 2012-09-11 DIAGNOSIS — R222 Localized swelling, mass and lump, trunk: Secondary | ICD-10-CM

## 2012-09-13 ENCOUNTER — Ambulatory Visit (HOSPITAL_COMMUNITY)
Admission: RE | Admit: 2012-09-13 | Discharge: 2012-09-13 | Disposition: A | Discharge status: Home / Self Care | Payer: Medicare Other | Source: Ambulatory Visit | Attending: Internal Medicine | Admitting: Internal Medicine

## 2012-09-13 DIAGNOSIS — J984 Other disorders of lung: Secondary | ICD-10-CM | POA: Diagnosis not present

## 2012-09-13 DIAGNOSIS — K7689 Other specified diseases of liver: Secondary | ICD-10-CM | POA: Diagnosis not present

## 2012-09-13 DIAGNOSIS — Z09 Encounter for follow-up examination after completed treatment for conditions other than malignant neoplasm: Secondary | ICD-10-CM | POA: Diagnosis not present

## 2012-09-13 DIAGNOSIS — R222 Localized swelling, mass and lump, trunk: Secondary | ICD-10-CM

## 2012-09-13 DIAGNOSIS — R918 Other nonspecific abnormal finding of lung field: Secondary | ICD-10-CM | POA: Insufficient documentation

## 2012-09-18 DIAGNOSIS — R911 Solitary pulmonary nodule: Secondary | ICD-10-CM | POA: Diagnosis not present

## 2012-09-21 DIAGNOSIS — G894 Chronic pain syndrome: Secondary | ICD-10-CM | POA: Diagnosis not present

## 2012-09-21 DIAGNOSIS — M531 Cervicobrachial syndrome: Secondary | ICD-10-CM | POA: Diagnosis not present

## 2012-09-21 DIAGNOSIS — M47812 Spondylosis without myelopathy or radiculopathy, cervical region: Secondary | ICD-10-CM | POA: Diagnosis not present

## 2012-09-28 ENCOUNTER — Inpatient Hospital Stay (HOSPITAL_COMMUNITY)
Admission: AD | Admit: 2012-09-28 | Discharge: 2012-09-30 | DRG: 303 | Disposition: A | Discharge status: Home / Self Care | Payer: Medicare Other | Source: Ambulatory Visit | Attending: Internal Medicine | Admitting: Internal Medicine

## 2012-09-28 DIAGNOSIS — Q211 Atrial septal defect: Secondary | ICD-10-CM

## 2012-09-28 DIAGNOSIS — Z880 Allergy status to penicillin: Secondary | ICD-10-CM | POA: Diagnosis not present

## 2012-09-28 DIAGNOSIS — Z79899 Other long term (current) drug therapy: Secondary | ICD-10-CM

## 2012-09-28 DIAGNOSIS — Z7982 Long term (current) use of aspirin: Secondary | ICD-10-CM

## 2012-09-28 DIAGNOSIS — I10 Essential (primary) hypertension: Secondary | ICD-10-CM | POA: Diagnosis present

## 2012-09-28 DIAGNOSIS — R079 Chest pain, unspecified: Secondary | ICD-10-CM | POA: Diagnosis not present

## 2012-09-28 DIAGNOSIS — K7689 Other specified diseases of liver: Secondary | ICD-10-CM | POA: Diagnosis not present

## 2012-09-28 DIAGNOSIS — E119 Type 2 diabetes mellitus without complications: Secondary | ICD-10-CM | POA: Diagnosis present

## 2012-09-28 DIAGNOSIS — E785 Hyperlipidemia, unspecified: Secondary | ICD-10-CM | POA: Diagnosis present

## 2012-09-28 DIAGNOSIS — I200 Unstable angina: Secondary | ICD-10-CM | POA: Diagnosis not present

## 2012-09-28 DIAGNOSIS — Z888 Allergy status to other drugs, medicaments and biological substances status: Secondary | ICD-10-CM

## 2012-09-28 DIAGNOSIS — I2510 Atherosclerotic heart disease of native coronary artery without angina pectoris: Secondary | ICD-10-CM | POA: Diagnosis not present

## 2012-09-28 DIAGNOSIS — R109 Unspecified abdominal pain: Secondary | ICD-10-CM | POA: Diagnosis present

## 2012-09-28 DIAGNOSIS — G8921 Chronic pain due to trauma: Secondary | ICD-10-CM | POA: Diagnosis present

## 2012-09-28 DIAGNOSIS — G609 Hereditary and idiopathic neuropathy, unspecified: Secondary | ICD-10-CM | POA: Diagnosis present

## 2012-09-28 DIAGNOSIS — F17200 Nicotine dependence, unspecified, uncomplicated: Secondary | ICD-10-CM | POA: Diagnosis present

## 2012-09-28 DIAGNOSIS — R748 Abnormal levels of other serum enzymes: Secondary | ICD-10-CM | POA: Diagnosis not present

## 2012-09-28 DIAGNOSIS — E782 Mixed hyperlipidemia: Secondary | ICD-10-CM | POA: Diagnosis not present

## 2012-09-28 DIAGNOSIS — K760 Fatty (change of) liver, not elsewhere classified: Secondary | ICD-10-CM | POA: Diagnosis present

## 2012-09-28 DIAGNOSIS — F3289 Other specified depressive episodes: Secondary | ICD-10-CM | POA: Diagnosis present

## 2012-09-28 DIAGNOSIS — F329 Major depressive disorder, single episode, unspecified: Secondary | ICD-10-CM | POA: Diagnosis present

## 2012-09-28 DIAGNOSIS — E1165 Type 2 diabetes mellitus with hyperglycemia: Secondary | ICD-10-CM | POA: Diagnosis present

## 2012-09-28 DIAGNOSIS — F411 Generalized anxiety disorder: Secondary | ICD-10-CM | POA: Diagnosis present

## 2012-09-28 DIAGNOSIS — R1013 Epigastric pain: Secondary | ICD-10-CM | POA: Diagnosis not present

## 2012-09-28 DIAGNOSIS — E1140 Type 2 diabetes mellitus with diabetic neuropathy, unspecified: Secondary | ICD-10-CM | POA: Diagnosis present

## 2012-09-28 HISTORY — DX: Fatty (change of) liver, not elsewhere classified: K76.0

## 2012-09-28 LAB — PROTIME-INR: INR: 0.91 (ref 0.00–1.49)

## 2012-09-28 LAB — COMPREHENSIVE METABOLIC PANEL
ALT: 45 U/L — ABNORMAL HIGH (ref 0–35)
AST: 30 U/L (ref 0–37)
CO2: 29 mEq/L (ref 19–32)
Chloride: 98 mEq/L (ref 96–112)
Creatinine, Ser: 0.55 mg/dL (ref 0.50–1.10)
GFR calc non Af Amer: >90 mL/min (ref >90)
Glucose, Bld: 157 mg/dL — ABNORMAL HIGH (ref 70–99)
Sodium: 138 mEq/L (ref 135–145)
Total Bilirubin: 0.2 mg/dL — ABNORMAL LOW (ref 0.3–1.2)

## 2012-09-28 LAB — TROPONIN I: Troponin I: <0.3 ng/mL (ref <0.30)

## 2012-09-28 LAB — LIPASE, BLOOD: Lipase: 100 U/L — ABNORMAL HIGH (ref 11–59)

## 2012-09-28 MED ORDER — GLYBURIDE 5 MG PO TABS
5.0000 mg | ORAL_TABLET | Freq: Three times a day (TID) | ORAL | Status: DC
  Administered 2012-09-28 – 2012-09-30 (×4): 5 mg via ORAL
  Filled 2012-09-28 (×8): qty 1

## 2012-09-28 MED ORDER — ACETAMINOPHEN 325 MG PO TABS: 650.0000 mg | ORAL_TABLET | ORAL | Status: DC | PRN

## 2012-09-28 MED ORDER — FLUOXETINE HCL 20 MG PO CAPS
20.0000 mg | ORAL_CAPSULE | Freq: Every day | ORAL | Status: DC
  Administered 2012-09-28 – 2012-09-29 (×2): 20 mg via ORAL
  Filled 2012-09-28 (×3): qty 1

## 2012-09-28 MED ORDER — METHADONE HCL 5 MG PO TABS: 5.0000 mg | ORAL_TABLET | Freq: Four times a day (QID) | ORAL | Status: DC | PRN

## 2012-09-28 MED ORDER — NITROGLYCERIN IN D5W 200-5 MCG/ML-% IV SOLN
2.0000 ug/min | INTRAVENOUS | Status: DC
  Administered 2012-09-28: 10 ug/min via INTRAVENOUS

## 2012-09-28 MED ORDER — ALPRAZOLAM 0.5 MG PO TABS
1.0000 mg | ORAL_TABLET | Freq: Two times a day (BID) | ORAL | Status: DC | PRN
  Administered 2012-09-29 (×2): 1 mg via ORAL
  Filled 2012-09-28 (×2): qty 2

## 2012-09-28 MED ORDER — ASPIRIN 81 MG PO CHEW
324.0000 mg | CHEWABLE_TABLET | ORAL | Status: AC
  Administered 2012-09-28: 324 mg via ORAL
  Filled 2012-09-28: qty 4

## 2012-09-28 MED ORDER — KETOROLAC TROMETHAMINE 30 MG/ML IJ SOLN
30.0000 mg | Freq: Four times a day (QID) | INTRAMUSCULAR | Status: DC | PRN
  Administered 2012-09-28: 30 mg via INTRAVENOUS
  Filled 2012-09-28: qty 1

## 2012-09-28 MED ORDER — METFORMIN HCL 500 MG PO TABS
500.0000 mg | ORAL_TABLET | Freq: Three times a day (TID) | ORAL | Status: DC
  Administered 2012-09-28 – 2012-09-30 (×4): 500 mg via ORAL
  Filled 2012-09-28 (×8): qty 1

## 2012-09-28 MED ORDER — RAMIPRIL 10 MG PO CAPS
10.0000 mg | ORAL_CAPSULE | Freq: Every day | ORAL | Status: DC
  Administered 2012-09-28 – 2012-09-30 (×3): 10 mg via ORAL
  Filled 2012-09-28 (×3): qty 1

## 2012-09-28 MED ORDER — METOPROLOL TARTRATE 100 MG PO TABS
100.0000 mg | ORAL_TABLET | Freq: Two times a day (BID) | ORAL | Status: DC
  Administered 2012-09-28 – 2012-09-30 (×4): 100 mg via ORAL
  Filled 2012-09-28 (×5): qty 1

## 2012-09-28 MED ORDER — GLYBURIDE-METFORMIN 5-500 MG PO TABS
1.0000 | ORAL_TABLET | Freq: Three times a day (TID) | ORAL | Status: DC
Start: 2012-09-28 — End: 2012-09-28

## 2012-09-28 MED ORDER — HEPARIN (PORCINE) IN NACL 100-0.45 UNIT/ML-% IJ SOLN
1400.0000 [IU]/h | INTRAMUSCULAR | Status: DC
  Administered 2012-09-28: 1100 [IU]/h via INTRAVENOUS
  Administered 2012-09-29: 1400 [IU]/h via INTRAVENOUS
  Filled 2012-09-28 (×2): qty 250

## 2012-09-28 MED ORDER — PROMETHAZINE HCL 25 MG PO TABS: 25.0000 mg | ORAL_TABLET | Freq: Three times a day (TID) | ORAL | Status: DC | PRN

## 2012-09-28 MED ORDER — ONDANSETRON HCL 4 MG/2ML IJ SOLN: 4.0000 mg | Freq: Four times a day (QID) | INTRAMUSCULAR | Status: DC | PRN

## 2012-09-28 MED ORDER — AMLODIPINE BESYLATE 5 MG PO TABS
5.0000 mg | ORAL_TABLET | Freq: Every day | ORAL | Status: DC
  Administered 2012-09-28 – 2012-09-29 (×2): 5 mg via ORAL
  Filled 2012-09-28 (×3): qty 1

## 2012-09-28 MED ORDER — OXYCODONE-ACETAMINOPHEN 5-325 MG PO TABS
1.0000 | ORAL_TABLET | Freq: Three times a day (TID) | ORAL | Status: DC | PRN
  Administered 2012-09-28 – 2012-09-29 (×3): 1 via ORAL
  Filled 2012-09-28 (×2): qty 1

## 2012-09-28 MED ORDER — ATORVASTATIN CALCIUM 20 MG PO TABS
20.0000 mg | ORAL_TABLET | Freq: Every day | ORAL | Status: DC
  Administered 2012-09-29: 20 mg via ORAL
  Filled 2012-09-28 (×2): qty 1

## 2012-09-28 MED ORDER — ASPIRIN 81 MG PO CHEW
81.0000 mg | CHEWABLE_TABLET | Freq: Every day | ORAL | Status: DC
  Administered 2012-09-29 – 2012-09-30 (×2): 81 mg via ORAL
  Filled 2012-09-28 (×2): qty 1

## 2012-09-28 MED ORDER — METHADONE HCL 10 MG PO TABS
10.0000 mg | ORAL_TABLET | Freq: Two times a day (BID) | ORAL | Status: DC
  Administered 2012-09-28 – 2012-09-30 (×4): 10 mg via ORAL
  Filled 2012-09-28 (×4): qty 1

## 2012-09-28 MED ORDER — ASPIRIN 81 MG PO TABS: 81.0000 mg | ORAL_TABLET | Freq: Every day | ORAL | Status: DC

## 2012-09-28 MED ORDER — NITROGLYCERIN IN D5W 200-5 MCG/ML-% IV SOLN
INTRAVENOUS | Status: AC
  Filled 2012-09-28: qty 250

## 2012-09-28 MED ORDER — HEPARIN BOLUS VIA INFUSION
4000.0000 [IU] | Freq: Once | INTRAVENOUS | Status: AC
  Administered 2012-09-28: 4000 [IU] via INTRAVENOUS
  Filled 2012-09-28: qty 4000

## 2012-09-28 MED ORDER — ASPIRIN 300 MG RE SUPP
300.0000 mg | RECTAL | Status: AC
  Filled 2012-09-28: qty 1
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
## 2012-09-29 ENCOUNTER — Inpatient Hospital Stay (HOSPITAL_COMMUNITY): Payer: Medicare Other

## 2012-09-29 ENCOUNTER — Encounter (HOSPITAL_COMMUNITY): Payer: Self-pay | Admitting: *Deleted

## 2012-09-29 DIAGNOSIS — E119 Type 2 diabetes mellitus without complications: Secondary | ICD-10-CM | POA: Diagnosis not present

## 2012-09-29 DIAGNOSIS — R079 Chest pain, unspecified: Secondary | ICD-10-CM | POA: Diagnosis not present

## 2012-09-29 DIAGNOSIS — K7689 Other specified diseases of liver: Secondary | ICD-10-CM | POA: Diagnosis not present

## 2012-09-29 DIAGNOSIS — I200 Unstable angina: Secondary | ICD-10-CM | POA: Diagnosis not present

## 2012-09-29 DIAGNOSIS — R1013 Epigastric pain: Secondary | ICD-10-CM | POA: Diagnosis not present

## 2012-09-29 DIAGNOSIS — R748 Abnormal levels of other serum enzymes: Secondary | ICD-10-CM | POA: Diagnosis not present

## 2012-09-29 DIAGNOSIS — I2510 Atherosclerotic heart disease of native coronary artery without angina pectoris: Secondary | ICD-10-CM | POA: Diagnosis not present

## 2012-09-29 HISTORY — PX: CARDIOVASCULAR STRESS TEST: SHX262

## 2012-09-29 LAB — HEPARIN LEVEL (UNFRACTIONATED)
Heparin Unfractionated: 0.27 IU/mL — ABNORMAL LOW (ref 0.30–0.70)
Heparin Unfractionated: 0.31 IU/mL (ref 0.30–0.70)

## 2012-09-29 LAB — CBC
MCH: 29 pg (ref 26.0–34.0)
MCV: 82 fL (ref 78.0–100.0)
Platelets: 224 10*3/uL (ref 150–400)
RDW: 12.9 % (ref 11.5–15.5)

## 2012-09-29 LAB — BASIC METABOLIC PANEL
BUN: 10 mg/dL (ref 6–23)
CO2: 29 mEq/L (ref 19–32)
Calcium: 9.3 mg/dL (ref 8.4–10.5)
Glucose, Bld: 137 mg/dL — ABNORMAL HIGH (ref 70–99)
Sodium: 138 mEq/L (ref 135–145)

## 2012-09-29 LAB — TROPONIN I
Troponin I: <0.3 ng/mL (ref <0.30)
Troponin I: <0.3 ng/mL (ref <0.30)

## 2012-09-29 LAB — LIPID PANEL: LDL Cholesterol: 42 mg/dL (ref 0–99)

## 2012-09-29 MED ORDER — SODIUM CHLORIDE 0.9 % IV SOLN
INTRAVENOUS | Status: DC | PRN
  Administered 2012-09-29: via INTRAVENOUS

## 2012-09-29 MED ORDER — REGADENOSON 0.4 MG/5ML IV SOLN
INTRAVENOUS | Status: AC
  Filled 2012-09-29: qty 5

## 2012-09-29 MED ORDER — HEPARIN (PORCINE) IN NACL 100-0.45 UNIT/ML-% IJ SOLN
1700.0000 [IU]/h | INTRAMUSCULAR | Status: DC
  Administered 2012-09-29: 1600 [IU]/h via INTRAVENOUS
  Administered 2012-09-30: 1700 [IU]/h via INTRAVENOUS
  Filled 2012-09-29 (×3): qty 250

## 2012-09-29 MED ORDER — TECHNETIUM TC 99M SESTAMIBI GENERIC - CARDIOLITE
30.0000 | Freq: Once | INTRAVENOUS | Status: AC | PRN
  Administered 2012-09-29: 30 via INTRAVENOUS

## 2012-09-29 MED ORDER — TECHNETIUM TC 99M SESTAMIBI GENERIC - CARDIOLITE
10.0000 | Freq: Once | INTRAVENOUS | Status: AC | PRN
  Administered 2012-09-29: 10 via INTRAVENOUS

## 2012-09-29 MED ORDER — HEPARIN BOLUS VIA INFUSION
2000.0000 [IU] | Freq: Once | INTRAVENOUS | Status: AC
  Administered 2012-09-29: 2000 [IU] via INTRAVENOUS
  Filled 2012-09-29: qty 2000

## 2012-09-29 MED ORDER — REGADENOSON 0.4 MG/5ML IV SOLN
0.4000 mg | Freq: Once | INTRAVENOUS | Status: AC
  Administered 2012-09-29: 0.4 mg via INTRAVENOUS

## 2012-09-29 MED ORDER — OXYCODONE-ACETAMINOPHEN 5-325 MG PO TABS
ORAL_TABLET | ORAL | Status: AC
  Filled 2012-09-29: qty 1

## 2012-09-29 NOTE — H&P (Addendum)
Pt. Seen and examined. Agree with the NP/PA-C note as written.  52 yo female with persistent stabbing chest pain, left arm parasthesia, history of ASD repair in the recent past, also nausea, vomiting, dizziness and diaphoresis. Most of her pain appears to be mid-epigastric and in the right upper quadrant of the abdomen, however, acute coronary syndrome cannot be excluded. EKG did not show ischemic changes. Plan admit to rule-out ACS. Labwork, including liver and pancreatic enzymes. Probable lexiscan NST in the morning.  Chrystie Nose, MD, [facility] Attending Cardiologist [facility]

## 2012-09-29 NOTE — Progress Notes (Signed)
ANTICOAGULATION CONSULT NOTE - Follow Up Consult  Pharmacy Consult for Heparin Indication: chest pain/ACS  Allergies  Allergen Reactions  . Imitrex (Sumatriptan) Swelling    States she had swelling in neck  . Lyrica (Pregabalin) Other (See Comments)    Severe Leg and feet swelling  . Penicillins Rash    Patient Measurements: Height: 5\' 5"  (165.1 cm) Weight: 158 lb 11.7 oz (72 kg) IBW/kg (Calculated) : 57 Heparin Dosing Weight: 72 kg  Vital Signs: Temp: 98.8 F (37.1 C) (05/02 0400) Temp src: Oral (05/02 0400) BP: 153/60 mmHg (05/02 0800) Pulse Rate: 55 (05/02 0000)  Labs:  Recent Labs  09/28/12 1825 09/29/12 0047 09/29/12 0827 09/29/12 0828  HGB  --   --   --  13.9  HCT  --   --   --  39.3  PLT  --   --   --  224  LABPROT 12.2  --   --   --   INR 0.91  --   --   --   HEPARINUNFRC  --  0.11* 0.27*  --   CREATININE 0.55  --   --   --   TROPONINI <0.30 <0.30  --   --     Estimated Creatinine Clearance: 82.7 ml/min (by C-G formula based on Cr of 0.55).   Medications:  Scheduled:  . amLODipine  5 mg Oral Daily  . [COMPLETED] aspirin  324 mg Oral NOW   Or  . [COMPLETED] aspirin  300 mg Rectal NOW  . aspirin  81 mg Oral Daily  . atorvastatin  20 mg Oral q1800  . FLUoxetine  20 mg Oral Daily  . glyBURIDE  5 mg Oral TID WC   And  . metFORMIN  500 mg Oral TID WC  . [COMPLETED] heparin  2,000 Units Intravenous Once  . [COMPLETED] heparin  4,000 Units Intravenous Once  . methadone  10 mg Oral Q12H  . metoprolol  100 mg Oral BID  . [COMPLETED] nitroGLYCERIN      . ramipril  10 mg Oral Daily  . [DISCONTINUED] aspirin  81 mg Oral Daily  . [DISCONTINUED] glyBURIDE-metformin  1 tablet Oral TID   . sodium chloride 10 mL/hr at 09/29/12 0000  . heparin 1,600 Units/hr (09/29/12 0933)  . nitroGLYCERIN 10 mcg/min (09/29/12 0800)  . [DISCONTINUED] heparin 1,400 Units/hr (09/29/12 0202)    Assessment: 52 year old female with a history of mild CAD in the LAD,  diagnosed by cath 9/12.  Presents today with CP that has been occurring since Monday.  Patient on IV heparin with plans for myoview this AM.  Heparin level (0.27) is below-goal on 1400 units/hr. No problem with line / infusion and no bleeding per RN.   Goal of Therapy:   Heparin level 0.3-0.7 units/ml Monitor platelets by anticoagulation protocol: Yes   Plan:  1. Increase IV heparin gtt to 1600 units/hr. 2. Check heparin level in 6 hrs. 3. Continue daily heparin level and CBC. 4. F/U after NST for whether to continue IV heparin.  Tad Moore, BCPS  Clinical Pharmacist Pager [phone]  09/29/2012 9:44 AM

## 2012-09-29 NOTE — Progress Notes (Signed)
ANTICOAGULATION CONSULT NOTE - Follow Up Consult  Pharmacy Consult for Heparin Indication: chest pain/ACS  Allergies  Allergen Reactions  . Imitrex (Sumatriptan) Swelling    States she had swelling in neck  . Lyrica (Pregabalin) Other (See Comments)    Severe Leg and feet swelling  . Penicillins Rash   Patient Measurements: Height: 5\' 5"  (165.1 cm) Weight: 158 lb 11.7 oz (72 kg) IBW/kg (Calculated) : 57 Heparin Dosing Weight: 72 kg  Vital Signs: Temp: 98.7 F (37.1 C) (05/02 1315) Temp src: Oral (05/02 1315) BP: 137/62 mmHg (05/02 1400) Pulse Rate: 57 (05/02 1400)  Labs:  Recent Labs  09/28/12 1825 09/29/12 0047 09/29/12 0827 09/29/12 0828 09/29/12 1630  HGB  --   --   --  13.9  --   HCT  --   --   --  39.3  --   PLT  --   --   --  224  --   LABPROT 12.2  --   --   --   --   INR 0.91  --   --   --   --   HEPARINUNFRC  --  0.11* 0.27*  --  0.31  CREATININE 0.55  --   --  0.56  --   TROPONINI <0.30 <0.30  --  <0.30  --     Estimated Creatinine Clearance: 82.7 ml/min (by C-G formula based on Cr of 0.56).   Medications:  Infusions: . sodium chloride 10 mL/hr at 09/29/12 0000  . heparin 1,600 Units/hr (09/29/12 0933)  . nitroGLYCERIN 10 mcg/min (09/29/12 0800)  . [DISCONTINUED] heparin 1,400 Units/hr (09/29/12 0202)    Assessment: 52 year old female with a history of mild CAD in the LAD, diagnosed by cath 9/12.  Presents today with CP that has been occurring since Monday.  Patient on IV heparin with myoview results pending.  Heparin level is therapeutic on the low end of normal on 1600 units/hr. No bleeding noted.  Goal of Therapy:   Heparin level 0.3-0.7 units/ml Monitor platelets by anticoagulation protocol: Yes   Plan:  -Increase IV heparin gtt to 1700 units/hr -Continue daily heparin level and CBC. -F/U results of myoview for whether to continue IV heparin  [facility], [place].D., BCPS Clinical Pharmacist Pager: [phone] 09/29/2012 5:21  PM

## 2012-09-29 NOTE — Progress Notes (Signed)
Pt. Seen and examined. Agree with the NP/PA-C note as written.  Her pain has improved - she remains tender in the mid-epigastrium, but no sharp RUQ tenderness, no Murphy's sign. Lipase and ALT are mildly elevated. She recently had laproscopic salpingo-oopherectomy surgery in 07/2012.  CT of the chest was also recently performed to follow-up lung nodules.  Incidentally noted fatty liver. There may be stranding of the pancreas to my read.  Plan for NST today, if negative, can d/c heparin. Will order abdominal ultrasound, focus on possible bile duct obstruction - ?passed stone causing her pain, nausea and vomiting.  Chrystie Nose, MD, [facility] Attending Cardiologist [facility]

## 2012-09-29 NOTE — Progress Notes (Signed)
[facility]  Subjective: Pt had substernal chest pain ~ 2 hours ago that lasted only 10 mins. Currently CP free. No SOB. No further RUQ tenderness.  Objective: Vital signs in last 24 hours: Temp:  [97.6 F (36.4 C)-98.9 F (37.2 C)] 98.8 F (37.1 C) (05/02 0400) Pulse Rate:  [55-78] 55 (05/02 0000) Resp:  [12-26] 17 (05/02 0600) BP: (107-223)/(57-77) 166/57 mmHg (05/02 0600) SpO2:  [95 %-98 %] 95 % (05/02 0400) Weight:  [158 lb 11.7 oz (72 kg)] 158 lb 11.7 oz (72 kg) (05/01 1806) Last BM Date: 09/28/12  Intake/Output from previous day: 05/01 0701 - 05/02 0700 In: 706.5 [P.O.:420; I.V.:286.5] Out: 600 [Urine:600] Intake/Output this shift:    Medications Current Facility-Administered Medications  Medication Dose Route Frequency Provider Last Rate Last Dose  . 0.9 %  sodium chloride infusion   Intravenous Continuous PRN Chrystie Nose, MD 10 mL/hr at 09/29/12 0000    . acetaminophen (TYLENOL) tablet 650 mg  650 mg Oral Q4H PRN Wilburt Finlay, PA-C      . ALPRAZolam Prudy Feeler) tablet 1 mg  1 mg Oral BID PRN Leeann Must, MD   1 mg at 09/29/12 0014  . amLODipine (NORVASC) tablet 5 mg  5 mg Oral Daily Wilburt Finlay, PA-C   5 mg at 09/28/12 1939  . aspirin chewable tablet 81 mg  81 mg Oral Daily Chrystie Nose, MD      . atorvastatin (LIPITOR) tablet 20 mg  20 mg Oral q1800 Wilburt Finlay, PA-C      . FLUoxetine (PROZAC) capsule 20 mg  20 mg Oral Daily Wilburt Finlay, PA-C   20 mg at 09/28/12 1940  . glyBURIDE (DIABETA) tablet 5 mg  5 mg Oral TID WC Chrystie Nose, MD   5 mg at 09/28/12 1939   And  . metFORMIN (GLUCOPHAGE) tablet 500 mg  500 mg Oral TID WC Chrystie Nose, MD   500 mg at 09/28/12 1936  . heparin ADULT infusion 100 units/mL (25000 units/250 mL)  1,400 Units/hr Intravenous Continuous Chrystie Nose, MD 14 mL/hr at 09/29/12 0202 1,400 Units/hr at 09/29/12 0202  . ketorolac (TORADOL) 30 MG/ML injection 30 mg  30 mg Intravenous Q6H PRN Wilburt Finlay, PA-C   30 mg at 09/28/12 1842  . methadone (DOLOPHINE) tablet 10 mg  10 mg Oral Q12H Chrystie Nose, MD   10 mg at 09/28/12 2126  . metoprolol (LOPRESSOR) tablet 100 mg  100 mg Oral BID Wilburt Finlay, PA-C   100 mg at 09/28/12 2126  . nitroGLYCERIN 0.2 mg/mL in dextrose 5 % infusion  2-200 mcg/min Intravenous Titrated Brittainy Simmons, PA-C   5 mcg/min at 09/29/12 0000  . ondansetron (ZOFRAN) injection 4 mg  4 mg Intravenous Q6H PRN Wilburt Finlay, PA-C      . oxyCODONE-acetaminophen (PERCOCET/ROXICET) 5-325 MG per tablet 1 tablet  1 tablet Oral Q8H PRN Robbie Lis, PA-C   1 tablet at 09/28/12 1950  . promethazine (PHENERGAN) tablet 25 mg  25 mg Oral Q8H PRN Wilburt Finlay, PA-C      . ramipril (ALTACE) capsule 10 mg  10 mg Oral Daily Wilburt Finlay, PA-C   10 mg at 09/28/12 1610    PE: General appearance: alert, cooperative and no distress Lungs: clear to auscultation bilaterally Heart: regular rate and rhythm Abdomen: soft, non-tender; bowel sounds normal; no masses,  no organomegaly and RUQ is nontender Extremities: no LEE Pulses: 2+ and symmetric Skin: warm and dry'  Neurologic: Grossly normal  Lab Results:  No results found for this basename: WBC, HGB, HCT, PLT,  in the last 72 hours BMET  Recent Labs  09/28/12 1825  NA 138  K 3.8  CL 98  CO2 29  GLUCOSE 157*  BUN 10  CREATININE 0.55  CALCIUM 10.0   PT/INR  Recent Labs  09/28/12 1825  LABPROT 12.2  INR 0.91    Enzymes Cardiac Panel (last 3 results)  Recent Labs  09/28/12 1825 09/29/12 0047  TROPONINI <0.30 <0.30    Assessment/Plan  Principal Problem:   Unstable angina Active Problems:   ASD (atrial septal defect) repair 1988   CAD (coronary artery disease), 30% LAD 9/12   HTN (hypertension)   Diabetes mellitus   Dyslipidemia   Chronic pain due to trauma   Abdominal pain: RUQ  Plan: Admitted directly from [facility] yesterday for symptoms concerning for unstable angina, by Wilburt Finlay, PA-C.  Troponin negative x 2. Awaiting 3rd and final troponin. Based on timing, I suspect that if patient has had an MI, there would have already been an increase in enzymes. Office EKG showed no acute changes. EKG from today is normal. Pt has a hx of mild CAD via last cath in 2012. Will keep NPO for now, as pt will need an ischemic evaluation. Plan for NST. Also according to H&P, the patient had RUQ tenderness on exam. Lipase is elevated at 100. She had nausea yesterday, but none today. ? If symptoms are related to gallbladder or possibly pancreatitis.     LOS: 1 day    Brittainy M. Delmer Islam 09/29/2012 7:49 AM

## 2012-09-29 NOTE — Progress Notes (Signed)
Utilization Review Completed. 09/29/2012  
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
## 2012-09-30 ENCOUNTER — Encounter (HOSPITAL_COMMUNITY): Payer: Self-pay | Admitting: Cardiology

## 2012-09-30 DIAGNOSIS — I2510 Atherosclerotic heart disease of native coronary artery without angina pectoris: Secondary | ICD-10-CM | POA: Diagnosis not present

## 2012-09-30 DIAGNOSIS — E119 Type 2 diabetes mellitus without complications: Secondary | ICD-10-CM | POA: Diagnosis not present

## 2012-09-30 DIAGNOSIS — K760 Fatty (change of) liver, not elsewhere classified: Secondary | ICD-10-CM

## 2012-09-30 DIAGNOSIS — I200 Unstable angina: Secondary | ICD-10-CM | POA: Diagnosis not present

## 2012-09-30 DIAGNOSIS — K7689 Other specified diseases of liver: Secondary | ICD-10-CM | POA: Diagnosis not present

## 2012-09-30 DIAGNOSIS — R079 Chest pain, unspecified: Secondary | ICD-10-CM | POA: Diagnosis not present

## 2012-09-30 HISTORY — DX: Fatty (change of) liver, not elsewhere classified: K76.0

## 2012-09-30 LAB — CBC
Hemoglobin: 13.1 g/dL (ref 12.0–15.0)
MCH: 28.1 pg (ref 26.0–34.0)
MCV: 83 fL (ref 78.0–100.0)
Platelets: 209 10*3/uL (ref 150–400)
RBC: 4.66 MIL/uL (ref 3.87–5.11)
WBC: 4.8 10*3/uL (ref 4.0–10.5)

## 2012-09-30 MED ORDER — ISOSORBIDE MONONITRATE ER 30 MG PO TB24: 30.0000 mg | ORAL_TABLET | Freq: Every day | ORAL | Status: DC

## 2012-09-30 MED ORDER — ATORVASTATIN CALCIUM 20 MG PO TABS: 20.0000 mg | ORAL_TABLET | Freq: Every day | ORAL | Status: DC

## 2012-09-30 MED ORDER — AMLODIPINE BESYLATE 10 MG PO TABS
10.0000 mg | ORAL_TABLET | Freq: Every day | ORAL | Status: DC
  Administered 2012-09-30: 10 mg via ORAL
  Filled 2012-09-30: qty 1

## 2012-09-30 MED ORDER — AMLODIPINE BESYLATE 5 MG PO TABS: 10.0000 mg | ORAL_TABLET | Freq: Every day | ORAL | Status: DC

## 2012-09-30 MED ORDER — FLUOXETINE HCL 20 MG PO TABS
20.0000 mg | ORAL_TABLET | Freq: Every day | ORAL | Status: DC
Start: 2012-09-30 — End: 2012-09-30
  Administered 2012-09-30: 20 mg via ORAL
  Filled 2012-09-30: qty 1

## 2012-09-30 MED ORDER — ISOSORBIDE MONONITRATE ER 30 MG PO TB24
30.0000 mg | ORAL_TABLET | Freq: Every day | ORAL | Status: DC
  Administered 2012-09-30: 30 mg via ORAL
  Filled 2012-09-30: qty 1

## 2012-09-30 NOTE — Progress Notes (Signed)
Reviewed discharge instructions with patient and family. Patient understood and signed. Iv removed. Will be escorted out via wheelchair the nurse tech.  Valor Quaintance, Charlaine Dalton Rn
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
## 2012-10-06 DIAGNOSIS — Z79899 Other long term (current) drug therapy: Secondary | ICD-10-CM | POA: Diagnosis not present

## 2012-10-06 DIAGNOSIS — R079 Chest pain, unspecified: Secondary | ICD-10-CM | POA: Diagnosis not present

## 2012-10-06 DIAGNOSIS — R5381 Other malaise: Secondary | ICD-10-CM | POA: Diagnosis not present

## 2012-10-06 DIAGNOSIS — I2510 Atherosclerotic heart disease of native coronary artery without angina pectoris: Secondary | ICD-10-CM | POA: Diagnosis not present

## 2012-10-06 DIAGNOSIS — R002 Palpitations: Secondary | ICD-10-CM | POA: Diagnosis not present

## 2012-10-06 DIAGNOSIS — Z8774 Personal history of (corrected) congenital malformations of heart and circulatory system: Secondary | ICD-10-CM | POA: Diagnosis not present

## 2012-10-09 DIAGNOSIS — I10 Essential (primary) hypertension: Secondary | ICD-10-CM | POA: Diagnosis not present

## 2012-10-09 DIAGNOSIS — E119 Type 2 diabetes mellitus without complications: Secondary | ICD-10-CM | POA: Diagnosis not present

## 2012-10-09 DIAGNOSIS — I2510 Atherosclerotic heart disease of native coronary artery without angina pectoris: Secondary | ICD-10-CM | POA: Diagnosis not present

## 2012-10-09 DIAGNOSIS — G43909 Migraine, unspecified, not intractable, without status migrainosus: Secondary | ICD-10-CM | POA: Diagnosis not present

## 2012-10-09 DIAGNOSIS — Z6832 Body mass index (BMI) 32.0-32.9, adult: Secondary | ICD-10-CM | POA: Diagnosis not present

## 2012-10-10 ENCOUNTER — Other Ambulatory Visit (HOSPITAL_COMMUNITY): Payer: Self-pay | Admitting: Cardiology

## 2012-10-10 ENCOUNTER — Other Ambulatory Visit (HOSPITAL_COMMUNITY): Payer: Self-pay | Admitting: Internal Medicine

## 2012-10-10 DIAGNOSIS — Z0000 Encounter for general adult medical examination without abnormal findings: Secondary | ICD-10-CM

## 2012-10-10 DIAGNOSIS — R079 Chest pain, unspecified: Secondary | ICD-10-CM

## 2012-10-11 ENCOUNTER — Ambulatory Visit (HOSPITAL_COMMUNITY)
Admission: RE | Admit: 2012-10-11 | Discharge: 2012-10-11 | Disposition: A | Discharge status: Home / Self Care | Payer: Medicare Other | Source: Ambulatory Visit | Attending: Cardiovascular Disease | Admitting: Cardiovascular Disease

## 2012-10-11 DIAGNOSIS — I379 Nonrheumatic pulmonary valve disorder, unspecified: Secondary | ICD-10-CM | POA: Diagnosis not present

## 2012-10-11 DIAGNOSIS — I079 Rheumatic tricuspid valve disease, unspecified: Secondary | ICD-10-CM | POA: Diagnosis not present

## 2012-10-11 DIAGNOSIS — F17200 Nicotine dependence, unspecified, uncomplicated: Secondary | ICD-10-CM | POA: Diagnosis not present

## 2012-10-11 DIAGNOSIS — R079 Chest pain, unspecified: Secondary | ICD-10-CM | POA: Insufficient documentation

## 2012-10-11 DIAGNOSIS — I517 Cardiomegaly: Secondary | ICD-10-CM | POA: Diagnosis not present

## 2012-10-11 DIAGNOSIS — I10 Essential (primary) hypertension: Secondary | ICD-10-CM | POA: Insufficient documentation

## 2012-10-11 NOTE — Progress Notes (Signed)
2D Echo Performed 10/11/2012    Kimori Tartaglia, RCS  

## 2012-10-13 ENCOUNTER — Ambulatory Visit (HOSPITAL_COMMUNITY)
Admission: RE | Admit: 2012-10-13 | Discharge: 2012-10-13 | Disposition: A | Discharge status: Home / Self Care | Payer: Medicare Other | Source: Ambulatory Visit | Attending: Internal Medicine | Admitting: Internal Medicine

## 2012-10-13 DIAGNOSIS — Z0000 Encounter for general adult medical examination without abnormal findings: Secondary | ICD-10-CM

## 2012-10-13 DIAGNOSIS — Z1231 Encounter for screening mammogram for malignant neoplasm of breast: Secondary | ICD-10-CM | POA: Diagnosis not present

## 2012-10-18 DIAGNOSIS — M531 Cervicobrachial syndrome: Secondary | ICD-10-CM | POA: Diagnosis not present

## 2012-10-18 DIAGNOSIS — M47812 Spondylosis without myelopathy or radiculopathy, cervical region: Secondary | ICD-10-CM | POA: Diagnosis not present

## 2012-10-18 DIAGNOSIS — G894 Chronic pain syndrome: Secondary | ICD-10-CM | POA: Diagnosis not present

## 2012-11-07 ENCOUNTER — Ambulatory Visit (INDEPENDENT_AMBULATORY_CARE_PROVIDER_SITE_OTHER): Payer: Medicare Other | Admitting: Cardiovascular Disease

## 2012-11-07 VITALS — BP 110/70 | HR 57 | Wt 162.5 lb

## 2012-11-07 DIAGNOSIS — F17200 Nicotine dependence, unspecified, uncomplicated: Secondary | ICD-10-CM

## 2012-11-07 DIAGNOSIS — E785 Hyperlipidemia, unspecified: Secondary | ICD-10-CM

## 2012-11-07 DIAGNOSIS — Q211 Atrial septal defect, unspecified: Secondary | ICD-10-CM

## 2012-11-07 DIAGNOSIS — Z720 Tobacco use: Secondary | ICD-10-CM

## 2012-11-07 DIAGNOSIS — R079 Chest pain, unspecified: Secondary | ICD-10-CM

## 2012-11-07 DIAGNOSIS — Q2111 Secundum atrial septal defect: Secondary | ICD-10-CM

## 2012-11-07 DIAGNOSIS — I2510 Atherosclerotic heart disease of native coronary artery without angina pectoris: Secondary | ICD-10-CM | POA: Diagnosis not present

## 2012-11-07 DIAGNOSIS — I10 Essential (primary) hypertension: Secondary | ICD-10-CM

## 2012-11-07 DIAGNOSIS — G8921 Chronic pain due to trauma: Secondary | ICD-10-CM

## 2012-11-07 MED ORDER — ISOSORBIDE MONONITRATE ER 60 MG PO TB24: 60.0000 mg | ORAL_TABLET | Freq: Every day | ORAL | Status: DC

## 2012-11-07 NOTE — Patient Instructions (Addendum)
Your physician has recommended you make the following change in your medication: ISOSORBIDE MN was changed from 30 mg to 60 mg.  Your physician recommends that you schedule a follow-up appointment in: 4 months

## 2012-11-16 DIAGNOSIS — M47812 Spondylosis without myelopathy or radiculopathy, cervical region: Secondary | ICD-10-CM | POA: Diagnosis not present

## 2012-11-16 DIAGNOSIS — Z79899 Other long term (current) drug therapy: Secondary | ICD-10-CM | POA: Diagnosis not present

## 2012-11-16 DIAGNOSIS — M531 Cervicobrachial syndrome: Secondary | ICD-10-CM | POA: Diagnosis not present

## 2012-11-16 DIAGNOSIS — G894 Chronic pain syndrome: Secondary | ICD-10-CM | POA: Diagnosis not present

## 2012-11-21 ENCOUNTER — Encounter (INDEPENDENT_AMBULATORY_CARE_PROVIDER_SITE_OTHER): Payer: Self-pay | Admitting: *Deleted

## 2012-11-23 ENCOUNTER — Encounter: Payer: Self-pay | Admitting: Cardiovascular Disease

## 2012-11-23 DIAGNOSIS — I2510 Atherosclerotic heart disease of native coronary artery without angina pectoris: Secondary | ICD-10-CM | POA: Diagnosis not present

## 2012-11-23 DIAGNOSIS — Z720 Tobacco use: Secondary | ICD-10-CM | POA: Insufficient documentation

## 2012-11-23 DIAGNOSIS — F411 Generalized anxiety disorder: Secondary | ICD-10-CM | POA: Diagnosis not present

## 2012-11-23 DIAGNOSIS — M79609 Pain in unspecified limb: Secondary | ICD-10-CM | POA: Diagnosis not present

## 2012-11-23 DIAGNOSIS — Z6832 Body mass index (BMI) 32.0-32.9, adult: Secondary | ICD-10-CM | POA: Diagnosis not present
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
## 2012-12-06 ENCOUNTER — Ambulatory Visit (HOSPITAL_COMMUNITY)
Admission: RE | Admit: 2012-12-06 | Discharge: 2012-12-06 | Disposition: A | Discharge status: Home / Self Care | Payer: Medicare Other | Source: Ambulatory Visit | Attending: Physician Assistant | Admitting: Physician Assistant

## 2012-12-06 ENCOUNTER — Other Ambulatory Visit (HOSPITAL_COMMUNITY): Payer: Self-pay | Admitting: Physician Assistant

## 2012-12-06 DIAGNOSIS — M25562 Pain in left knee: Secondary | ICD-10-CM

## 2012-12-06 DIAGNOSIS — S99929A Unspecified injury of unspecified foot, initial encounter: Secondary | ICD-10-CM | POA: Insufficient documentation

## 2012-12-06 DIAGNOSIS — M7989 Other specified soft tissue disorders: Secondary | ICD-10-CM | POA: Diagnosis not present

## 2012-12-06 DIAGNOSIS — M25569 Pain in unspecified knee: Secondary | ICD-10-CM | POA: Diagnosis not present

## 2012-12-06 DIAGNOSIS — S8990XA Unspecified injury of unspecified lower leg, initial encounter: Secondary | ICD-10-CM | POA: Insufficient documentation

## 2012-12-06 DIAGNOSIS — Z6832 Body mass index (BMI) 32.0-32.9, adult: Secondary | ICD-10-CM | POA: Diagnosis not present

## 2012-12-06 DIAGNOSIS — W19XXXA Unspecified fall, initial encounter: Secondary | ICD-10-CM | POA: Insufficient documentation

## 2012-12-14 DIAGNOSIS — M47812 Spondylosis without myelopathy or radiculopathy, cervical region: Secondary | ICD-10-CM | POA: Diagnosis not present

## 2012-12-14 DIAGNOSIS — Z79899 Other long term (current) drug therapy: Secondary | ICD-10-CM | POA: Diagnosis not present

## 2012-12-14 DIAGNOSIS — M531 Cervicobrachial syndrome: Secondary | ICD-10-CM | POA: Diagnosis not present

## 2012-12-14 DIAGNOSIS — G894 Chronic pain syndrome: Secondary | ICD-10-CM | POA: Diagnosis not present

## 2012-12-20 ENCOUNTER — Emergency Department (HOSPITAL_COMMUNITY)
Admission: EM | Admit: 2012-12-20 | Discharge: 2012-12-20 | Disposition: A | Discharge status: Home / Self Care | Payer: Medicare Other | Attending: Emergency Medicine | Admitting: Emergency Medicine

## 2012-12-20 ENCOUNTER — Encounter (HOSPITAL_COMMUNITY): Payer: Self-pay | Admitting: *Deleted

## 2012-12-20 DIAGNOSIS — F329 Major depressive disorder, single episode, unspecified: Secondary | ICD-10-CM | POA: Diagnosis not present

## 2012-12-20 DIAGNOSIS — M7989 Other specified soft tissue disorders: Secondary | ICD-10-CM | POA: Diagnosis not present

## 2012-12-20 DIAGNOSIS — Z862 Personal history of diseases of the blood and blood-forming organs and certain disorders involving the immune mechanism: Secondary | ICD-10-CM | POA: Diagnosis not present

## 2012-12-20 DIAGNOSIS — K7689 Other specified diseases of liver: Secondary | ICD-10-CM | POA: Insufficient documentation

## 2012-12-20 DIAGNOSIS — E1149 Type 2 diabetes mellitus with other diabetic neurological complication: Secondary | ICD-10-CM | POA: Insufficient documentation

## 2012-12-20 DIAGNOSIS — Z8679 Personal history of other diseases of the circulatory system: Secondary | ICD-10-CM | POA: Insufficient documentation

## 2012-12-20 DIAGNOSIS — R609 Edema, unspecified: Secondary | ICD-10-CM | POA: Diagnosis not present

## 2012-12-20 DIAGNOSIS — Z79899 Other long term (current) drug therapy: Secondary | ICD-10-CM | POA: Insufficient documentation

## 2012-12-20 DIAGNOSIS — Z7982 Long term (current) use of aspirin: Secondary | ICD-10-CM | POA: Insufficient documentation

## 2012-12-20 DIAGNOSIS — E119 Type 2 diabetes mellitus without complications: Secondary | ICD-10-CM | POA: Diagnosis not present

## 2012-12-20 DIAGNOSIS — I2510 Atherosclerotic heart disease of native coronary artery without angina pectoris: Secondary | ICD-10-CM | POA: Diagnosis not present

## 2012-12-20 DIAGNOSIS — Z87440 Personal history of urinary (tract) infections: Secondary | ICD-10-CM | POA: Insufficient documentation

## 2012-12-20 DIAGNOSIS — Z8669 Personal history of other diseases of the nervous system and sense organs: Secondary | ICD-10-CM | POA: Insufficient documentation

## 2012-12-20 DIAGNOSIS — Z8742 Personal history of other diseases of the female genital tract: Secondary | ICD-10-CM | POA: Diagnosis not present

## 2012-12-20 DIAGNOSIS — Z8719 Personal history of other diseases of the digestive system: Secondary | ICD-10-CM | POA: Diagnosis not present

## 2012-12-20 DIAGNOSIS — G909 Disorder of the autonomic nervous system, unspecified: Secondary | ICD-10-CM | POA: Insufficient documentation

## 2012-12-20 DIAGNOSIS — G5790 Unspecified mononeuropathy of unspecified lower limb: Secondary | ICD-10-CM | POA: Diagnosis not present

## 2012-12-20 DIAGNOSIS — F411 Generalized anxiety disorder: Secondary | ICD-10-CM | POA: Diagnosis not present

## 2012-12-20 DIAGNOSIS — G629 Polyneuropathy, unspecified: Secondary | ICD-10-CM

## 2012-12-20 DIAGNOSIS — F3289 Other specified depressive episodes: Secondary | ICD-10-CM | POA: Insufficient documentation

## 2012-12-20 DIAGNOSIS — F17200 Nicotine dependence, unspecified, uncomplicated: Secondary | ICD-10-CM | POA: Diagnosis not present

## 2012-12-20 DIAGNOSIS — I10 Essential (primary) hypertension: Secondary | ICD-10-CM | POA: Insufficient documentation

## 2012-12-20 MED ORDER — GABAPENTIN 300 MG PO CAPS: 300.0000 mg | ORAL_CAPSULE | Freq: Three times a day (TID) | ORAL | Status: DC

## 2012-12-20 MED ORDER — OXYCODONE-ACETAMINOPHEN 5-325 MG PO TABS: 1.0000 | ORAL_TABLET | Freq: Four times a day (QID) | ORAL | Status: DC | PRN

## 2012-12-20 MED ORDER — OXYCODONE-ACETAMINOPHEN 5-325 MG PO TABS
2.0000 | ORAL_TABLET | Freq: Once | ORAL | Status: AC
  Administered 2012-12-20: 2 via ORAL
  Filled 2012-12-20: qty 2

## 2012-12-20 NOTE — ED Notes (Signed)
Pt is here with bilateral leg pain and has seen MD twice and states left knee is swollen.  Pulses present.  Pt states left calf is swollen.
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
## 2012-12-20 NOTE — Progress Notes (Signed)
VASCULAR LAB PRELIMINARY  PRELIMINARY  PRELIMINARY  PRELIMINARY  Left lower extremity venous duplex completed.    Preliminary report:  Left:  No evidence of DVT, superficial thrombosis, or Baker's cyst.  Brnadon Eoff, RVT 12/20/2012, 4:52 PM

## 2012-12-21 NOTE — ED Provider Notes (Signed)
Medical screening examination/treatment/procedure(s) were performed by non-physician practitioner and as supervising physician I was immediately available for consultation/collaboration.   Carleene Cooper III, MD 12/21/12 1215

## 2013-01-11 DIAGNOSIS — G894 Chronic pain syndrome: Secondary | ICD-10-CM | POA: Diagnosis not present

## 2013-01-11 DIAGNOSIS — M5030 Other cervical disc degeneration, unspecified cervical region: Secondary | ICD-10-CM | POA: Diagnosis not present

## 2013-01-11 DIAGNOSIS — Z79899 Other long term (current) drug therapy: Secondary | ICD-10-CM | POA: Diagnosis not present

## 2013-01-16 DIAGNOSIS — Z6830 Body mass index (BMI) 30.0-30.9, adult: Secondary | ICD-10-CM | POA: Diagnosis not present

## 2013-01-16 DIAGNOSIS — Z23 Encounter for immunization: Secondary | ICD-10-CM | POA: Diagnosis not present

## 2013-01-16 DIAGNOSIS — E1149 Type 2 diabetes mellitus with other diabetic neurological complication: Secondary | ICD-10-CM | POA: Diagnosis not present

## 2013-01-16 DIAGNOSIS — G5770 Causalgia of unspecified lower limb: Secondary | ICD-10-CM | POA: Diagnosis not present

## 2013-01-16 DIAGNOSIS — I10 Essential (primary) hypertension: Secondary | ICD-10-CM | POA: Diagnosis not present

## 2013-01-20 ENCOUNTER — Emergency Department (HOSPITAL_COMMUNITY): Payer: Medicare Other

## 2013-01-20 ENCOUNTER — Encounter (HOSPITAL_COMMUNITY): Payer: Self-pay

## 2013-01-20 ENCOUNTER — Emergency Department (HOSPITAL_COMMUNITY)
Admission: EM | Admit: 2013-01-20 | Discharge: 2013-01-21 | Disposition: A | Discharge status: Home / Self Care | Payer: Medicare Other | Attending: Emergency Medicine | Admitting: Emergency Medicine

## 2013-01-20 DIAGNOSIS — R1084 Generalized abdominal pain: Secondary | ICD-10-CM | POA: Diagnosis not present

## 2013-01-20 DIAGNOSIS — I498 Other specified cardiac arrhythmias: Secondary | ICD-10-CM | POA: Insufficient documentation

## 2013-01-20 DIAGNOSIS — Z862 Personal history of diseases of the blood and blood-forming organs and certain disorders involving the immune mechanism: Secondary | ICD-10-CM | POA: Diagnosis not present

## 2013-01-20 DIAGNOSIS — R112 Nausea with vomiting, unspecified: Secondary | ICD-10-CM | POA: Insufficient documentation

## 2013-01-20 DIAGNOSIS — Z8742 Personal history of other diseases of the female genital tract: Secondary | ICD-10-CM | POA: Insufficient documentation

## 2013-01-20 DIAGNOSIS — Z8669 Personal history of other diseases of the nervous system and sense organs: Secondary | ICD-10-CM | POA: Insufficient documentation

## 2013-01-20 DIAGNOSIS — Z8679 Personal history of other diseases of the circulatory system: Secondary | ICD-10-CM | POA: Insufficient documentation

## 2013-01-20 DIAGNOSIS — Z8719 Personal history of other diseases of the digestive system: Secondary | ICD-10-CM | POA: Insufficient documentation

## 2013-01-20 DIAGNOSIS — Z9861 Coronary angioplasty status: Secondary | ICD-10-CM | POA: Insufficient documentation

## 2013-01-20 DIAGNOSIS — Z7982 Long term (current) use of aspirin: Secondary | ICD-10-CM | POA: Insufficient documentation

## 2013-01-20 DIAGNOSIS — G43909 Migraine, unspecified, not intractable, without status migrainosus: Secondary | ICD-10-CM | POA: Insufficient documentation

## 2013-01-20 DIAGNOSIS — R142 Eructation: Secondary | ICD-10-CM | POA: Insufficient documentation

## 2013-01-20 DIAGNOSIS — F329 Major depressive disorder, single episode, unspecified: Secondary | ICD-10-CM | POA: Insufficient documentation

## 2013-01-20 DIAGNOSIS — G8929 Other chronic pain: Secondary | ICD-10-CM | POA: Diagnosis not present

## 2013-01-20 DIAGNOSIS — Z9889 Other specified postprocedural states: Secondary | ICD-10-CM | POA: Insufficient documentation

## 2013-01-20 DIAGNOSIS — Z87891 Personal history of nicotine dependence: Secondary | ICD-10-CM | POA: Insufficient documentation

## 2013-01-20 DIAGNOSIS — M542 Cervicalgia: Secondary | ICD-10-CM | POA: Insufficient documentation

## 2013-01-20 DIAGNOSIS — Z79899 Other long term (current) drug therapy: Secondary | ICD-10-CM | POA: Insufficient documentation

## 2013-01-20 DIAGNOSIS — Z87440 Personal history of urinary (tract) infections: Secondary | ICD-10-CM | POA: Diagnosis not present

## 2013-01-20 DIAGNOSIS — F411 Generalized anxiety disorder: Secondary | ICD-10-CM | POA: Diagnosis not present

## 2013-01-20 DIAGNOSIS — I2510 Atherosclerotic heart disease of native coronary artery without angina pectoris: Secondary | ICD-10-CM | POA: Insufficient documentation

## 2013-01-20 DIAGNOSIS — I209 Angina pectoris, unspecified: Secondary | ICD-10-CM | POA: Insufficient documentation

## 2013-01-20 DIAGNOSIS — R300 Dysuria: Secondary | ICD-10-CM | POA: Insufficient documentation

## 2013-01-20 DIAGNOSIS — R5083 Postvaccination fever: Secondary | ICD-10-CM | POA: Insufficient documentation

## 2013-01-20 DIAGNOSIS — R141 Gas pain: Secondary | ICD-10-CM | POA: Insufficient documentation

## 2013-01-20 DIAGNOSIS — F3289 Other specified depressive episodes: Secondary | ICD-10-CM | POA: Insufficient documentation

## 2013-01-20 DIAGNOSIS — I10 Essential (primary) hypertension: Secondary | ICD-10-CM | POA: Diagnosis not present

## 2013-01-20 DIAGNOSIS — Z90710 Acquired absence of both cervix and uterus: Secondary | ICD-10-CM | POA: Insufficient documentation

## 2013-01-20 DIAGNOSIS — E119 Type 2 diabetes mellitus without complications: Secondary | ICD-10-CM | POA: Insufficient documentation

## 2013-01-20 DIAGNOSIS — G609 Hereditary and idiopathic neuropathy, unspecified: Secondary | ICD-10-CM | POA: Insufficient documentation

## 2013-01-20 DIAGNOSIS — Z8774 Personal history of (corrected) congenital malformations of heart and circulatory system: Secondary | ICD-10-CM | POA: Diagnosis not present

## 2013-01-20 DIAGNOSIS — K7689 Other specified diseases of liver: Secondary | ICD-10-CM | POA: Diagnosis not present

## 2013-01-20 DIAGNOSIS — N390 Urinary tract infection, site not specified: Secondary | ICD-10-CM | POA: Diagnosis not present

## 2013-01-20 DIAGNOSIS — N200 Calculus of kidney: Secondary | ICD-10-CM | POA: Diagnosis not present

## 2013-01-20 DIAGNOSIS — Z880 Allergy status to penicillin: Secondary | ICD-10-CM | POA: Insufficient documentation

## 2013-01-20 LAB — CBC WITH DIFFERENTIAL/PLATELET
Hemoglobin: 13.8 g/dL (ref 12.0–15.0)
Lymphocytes Relative: 29 % (ref 12–46)
Lymphs Abs: 2.2 10*3/uL (ref 0.7–4.0)
MCH: 29.3 pg (ref 26.0–34.0)
Monocytes Relative: 7 % (ref 3–12)
Neutrophils Relative %: 62 % (ref 43–77)
Platelets: 241 10*3/uL (ref 150–400)
RBC: 4.71 MIL/uL (ref 3.87–5.11)
WBC: 7.7 10*3/uL (ref 4.0–10.5)

## 2013-01-20 LAB — URINALYSIS, ROUTINE W REFLEX MICROSCOPIC
Bilirubin Urine: NEGATIVE
Ketones, ur: NEGATIVE mg/dL
Leukocytes, UA: NEGATIVE
Nitrite: NEGATIVE
Protein, ur: NEGATIVE mg/dL

## 2013-01-20 LAB — COMPREHENSIVE METABOLIC PANEL
ALT: 25 U/L (ref 0–35)
AST: 26 U/L (ref 0–37)
CO2: 29 mEq/L (ref 19–32)
Calcium: 9.6 mg/dL (ref 8.4–10.5)
Creatinine, Ser: 0.64 mg/dL (ref 0.50–1.10)
GFR calc Af Amer: >90 mL/min (ref >90)
GFR calc non Af Amer: >90 mL/min (ref >90)
Glucose, Bld: 136 mg/dL — ABNORMAL HIGH (ref 70–99)
Sodium: 136 mEq/L (ref 135–145)
Total Protein: 8 g/dL (ref 6.0–8.3)

## 2013-01-20 LAB — URINE MICROSCOPIC-ADD ON

## 2013-01-20 MED ORDER — SODIUM CHLORIDE 0.9 % IV SOLN
Freq: Once | INTRAVENOUS | Status: AC
  Administered 2013-01-20: 23:00:00 via INTRAVENOUS

## 2013-01-20 MED ORDER — ONDANSETRON HCL 4 MG/2ML IJ SOLN
4.0000 mg | Freq: Once | INTRAMUSCULAR | Status: AC
  Administered 2013-01-20: 4 mg via INTRAVENOUS
  Filled 2013-01-20: qty 2

## 2013-01-20 MED ORDER — FENTANYL CITRATE 0.05 MG/ML IJ SOLN
100.0000 ug | Freq: Once | INTRAMUSCULAR | Status: AC
  Administered 2013-01-20: 100 ug via INTRAVENOUS
  Filled 2013-01-20: qty 2
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
## 2013-01-20 NOTE — ED Notes (Signed)
Pt states she was diagnosed with uti when she went to her pmd on Tuesday, states her abd pain is worse and she is feeling bloated.  Pt also reports nausea and vomiting.

## 2013-01-21 ENCOUNTER — Encounter (HOSPITAL_COMMUNITY): Payer: Self-pay | Admitting: *Deleted

## 2013-01-21 DIAGNOSIS — N200 Calculus of kidney: Secondary | ICD-10-CM | POA: Diagnosis not present

## 2013-01-21 DIAGNOSIS — K7689 Other specified diseases of liver: Secondary | ICD-10-CM | POA: Diagnosis not present

## 2013-01-21 MED ORDER — IOHEXOL 300 MG/ML  SOLN
100.0000 mL | Freq: Once | INTRAMUSCULAR | Status: AC | PRN
  Administered 2013-01-21: 100 mL via INTRAVENOUS

## 2013-01-21 MED ORDER — PROMETHAZINE HCL 25 MG/ML IJ SOLN
12.5000 mg | Freq: Once | INTRAMUSCULAR | Status: AC
  Administered 2013-01-21: 12.5 mg via INTRAVENOUS
  Filled 2013-01-21: qty 1

## 2013-01-21 MED ORDER — CEPHALEXIN 500 MG PO CAPS: 500.0000 mg | ORAL_CAPSULE | Freq: Four times a day (QID) | ORAL | Status: DC

## 2013-01-21 MED ORDER — DEXTROSE 5 % IV SOLN
1.0000 g | INTRAVENOUS | Status: DC
  Administered 2013-01-21: 1 g via INTRAVENOUS
  Filled 2013-01-21: qty 10

## 2013-01-21 MED ORDER — IOHEXOL 300 MG/ML  SOLN
50.0000 mL | Freq: Once | INTRAMUSCULAR | Status: AC | PRN
  Administered 2013-01-21: 50 mL via ORAL

## 2013-01-21 MED ORDER — FENTANYL CITRATE 0.05 MG/ML IJ SOLN
100.0000 ug | Freq: Once | INTRAMUSCULAR | Status: AC
  Administered 2013-01-21: 100 ug via INTRAVENOUS
  Filled 2013-01-21: qty 2

## 2013-01-21 NOTE — ED Notes (Signed)
Pt ambulatory to the bathroom, assisted by husband.

## 2013-01-21 NOTE — ED Notes (Signed)
Patient given discharge instruction, verbalized understand. IV removed, band aid applied. Patient ambulatory out of the department with husband.  

## 2013-01-21 NOTE — ED Notes (Signed)
Pt and husband aware to hold metformin x 48hr. Pt ambulatory to the bathroom

## 2013-01-23 LAB — URINE CULTURE
Colony Count: NO GROWTH
Culture: NO GROWTH

## 2013-01-25 ENCOUNTER — Other Ambulatory Visit (HOSPITAL_COMMUNITY): Payer: Self-pay | Admitting: Internal Medicine

## 2013-01-25 ENCOUNTER — Ambulatory Visit (HOSPITAL_COMMUNITY)
Admission: RE | Admit: 2013-01-25 | Discharge: 2013-01-25 | Disposition: A | Discharge status: Home / Self Care | Payer: Medicare Other | Source: Ambulatory Visit | Attending: Internal Medicine | Admitting: Internal Medicine

## 2013-01-25 DIAGNOSIS — I2510 Atherosclerotic heart disease of native coronary artery without angina pectoris: Secondary | ICD-10-CM | POA: Diagnosis not present

## 2013-01-25 DIAGNOSIS — R1012 Left upper quadrant pain: Secondary | ICD-10-CM | POA: Diagnosis not present

## 2013-01-25 DIAGNOSIS — R1032 Left lower quadrant pain: Secondary | ICD-10-CM | POA: Insufficient documentation

## 2013-01-25 DIAGNOSIS — R109 Unspecified abdominal pain: Secondary | ICD-10-CM | POA: Diagnosis not present

## 2013-01-25 DIAGNOSIS — Z6831 Body mass index (BMI) 31.0-31.9, adult: Secondary | ICD-10-CM | POA: Diagnosis not present

## 2013-01-25 DIAGNOSIS — K5900 Constipation, unspecified: Secondary | ICD-10-CM | POA: Insufficient documentation

## 2013-01-25 DIAGNOSIS — G43909 Migraine, unspecified, not intractable, without status migrainosus: Secondary | ICD-10-CM | POA: Diagnosis not present

## 2013-02-01 ENCOUNTER — Ambulatory Visit (HOSPITAL_COMMUNITY)
Admission: RE | Admit: 2013-02-01 | Discharge: 2013-02-01 | Disposition: A | Discharge status: Home / Self Care | Payer: Medicare Other | Source: Ambulatory Visit | Attending: Internal Medicine | Admitting: Internal Medicine

## 2013-02-01 ENCOUNTER — Encounter (INDEPENDENT_AMBULATORY_CARE_PROVIDER_SITE_OTHER): Payer: Self-pay | Admitting: Internal Medicine

## 2013-02-01 ENCOUNTER — Ambulatory Visit (INDEPENDENT_AMBULATORY_CARE_PROVIDER_SITE_OTHER): Payer: Medicare Other | Admitting: Internal Medicine

## 2013-02-01 VITALS — BP 114/58 | HR 52 | Temp 98.5°F | Ht 65.0 in | Wt 161.0 lb

## 2013-02-01 DIAGNOSIS — R141 Gas pain: Secondary | ICD-10-CM | POA: Diagnosis not present

## 2013-02-01 DIAGNOSIS — R140 Abdominal distension (gaseous): Secondary | ICD-10-CM | POA: Insufficient documentation

## 2013-02-01 DIAGNOSIS — R109 Unspecified abdominal pain: Secondary | ICD-10-CM | POA: Diagnosis not present

## 2013-02-01 NOTE — Patient Instructions (Signed)
KUB today. Continue the probiotic.
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
## 2013-02-08 DIAGNOSIS — M47812 Spondylosis without myelopathy or radiculopathy, cervical region: Secondary | ICD-10-CM | POA: Diagnosis not present

## 2013-02-08 DIAGNOSIS — R1033 Periumbilical pain: Secondary | ICD-10-CM | POA: Diagnosis not present

## 2013-02-08 DIAGNOSIS — Z79899 Other long term (current) drug therapy: Secondary | ICD-10-CM | POA: Diagnosis not present

## 2013-02-08 DIAGNOSIS — G894 Chronic pain syndrome: Secondary | ICD-10-CM | POA: Diagnosis not present

## 2013-02-27 ENCOUNTER — Encounter (INDEPENDENT_AMBULATORY_CARE_PROVIDER_SITE_OTHER): Payer: Self-pay | Admitting: Internal Medicine

## 2013-02-27 ENCOUNTER — Ambulatory Visit (INDEPENDENT_AMBULATORY_CARE_PROVIDER_SITE_OTHER): Payer: Medicare Other | Admitting: Internal Medicine

## 2013-02-27 VITALS — BP 130/86 | HR 80 | Temp 97.3°F | Resp 18 | Ht 65.0 in | Wt 159.6 lb

## 2013-02-27 DIAGNOSIS — R1032 Left lower quadrant pain: Secondary | ICD-10-CM | POA: Diagnosis not present

## 2013-02-27 DIAGNOSIS — K5900 Constipation, unspecified: Secondary | ICD-10-CM

## 2013-02-27 MED ORDER — OXYCODONE HCL 10 MG PO TABS: 10.0000 mg | ORAL_TABLET | Freq: Three times a day (TID) | ORAL | Status: DC | PRN

## 2013-02-27 NOTE — Patient Instructions (Signed)
Please call Dr. Vear Clock' office to request earlier appointment.

## 2013-02-27 NOTE — Progress Notes (Signed)
Presenting complaint;  LLQ abdominal pain.  Subjective:  Patient is 52 year old Caucasian female who is here for scheduled visit accompanied by her husband. She was just seen over 3 weeks ago by Ms. Dorene Ar, NP. She states her bowels move almost every day but her pain is getting worse. She also complains of being bloated. Pain is mainly in left lower quadrant of her abdomen. She also complains of headache neck and back pain as well as pain in her lower extremities. She stays current pain medications are not helping her. She denies nausea or vomiting. Her appetite is poor but she is maintaining her weight. She was begun on Neurontin two months ago which has helped with lower extremity pain but not with abdominal or back pain. She was in emergency room in July and August and has seen Dr. Sherwood Gambler multiple times.  Current Medications: Current Outpatient Prescriptions  Medication Sig Dispense Refill  . ALPRAZolam (XANAX) 1 MG tablet Take 1 mg by mouth 2 (two) times daily.      Marland Kitchen amLODipine (NORVASC) 5 MG tablet Take 2 tablets (10 mg total) by mouth daily.  30 tablet  6  . aspirin EC 81 MG tablet Take 81 mg by mouth daily.      Marland Kitchen atorvastatin (LIPITOR) 20 MG tablet Take 1 tablet (20 mg total) by mouth daily at 6 PM.  30 tablet  6  . docusate sodium (COLACE) 100 MG capsule Take 100 mg by mouth 2 (two) times daily.      Marland Kitchen FLUoxetine (PROZAC) 20 MG capsule Take 20 mg by mouth daily.      Marland Kitchen gabapentin (NEURONTIN) 300 MG capsule Take 1 capsule (300 mg total) by mouth 3 (three) times daily.  30 capsule  0  . glyBURIDE-metformin (GLUCOVANCE) 5-500 MG per tablet Take 1 tablet by mouth 3 (three) times daily.       . isosorbide mononitrate (IMDUR) 60 MG 24 hr tablet Take 60 mg by mouth every evening.      . methadone (DOLOPHINE) 5 MG tablet Take 5 mg by mouth 2 (two) times daily.      . metoprolol (LOPRESSOR) 100 MG tablet Take 100 mg by mouth 2 (two) times daily.      . nitroGLYCERIN (NITROSTAT) 0.4 MG SL  tablet Place 0.4 mg under the tongue every 5 (five) minutes as needed for chest pain.      Marland Kitchen oxyCODONE-acetaminophen (PERCOCET/ROXICET) 5-325 MG per tablet Take 1 tablet by mouth every 8 (eight) hours as needed for pain.      Marland Kitchen Potassium Gluconate 595 MG CAPS Take by mouth.      . Probiotic Product (HEALTHY COLON PO) Take by mouth at bedtime.      . ramipril (ALTACE) 10 MG capsule Take 10 mg by mouth 2 (two) times daily.        No current facility-administered medications for this visit.     Objective: Blood pressure 130/86, pulse 80, temperature 97.3 F (36.3 C), temperature source Oral, resp. rate 18, height 5\' 5"  (1.651 m), weight 159 lb 9.6 oz (72.394 kg). Patient prefers to remain standing. She appears to be in some distress. She is in tears. Conjunctiva is pink. Sclera is nonicteric Oropharyngeal mucosa is normal. No neck masses or thyromegaly noted. Abdomen is full. Bowel sounds are normal. Abdomen is soft with mild to moderate tenderness in LLQ. No organomegaly or masses noted.  No LE edema or clubbing noted.  Labs/studies Results: Acute abdominal series from 02/02/2013 and  01/25/2013 reviewed. Abdominal pelvic CT from 06/06/2012 and 01/21/2013 reviewed.   Assessment:  #1. Chronic LLQ abdominal pain with recent excess sedation. She has undergone multiple CTs and no abnormality noted to account for pain. Her bowels are moving better her pain is getting worse. I suspect this is chronic abdominal pain or pain secondary to neuropathy. She will need colonoscopy at some point primarily for screening purposes. Previous attempt was unsuccessful because of poor prep. It appears to me that her of dominant pain as well as other pain is poorly controlled with current combination and she would benefit from followup with Dr. Thyra Breed as soon as possible. If patient is interested I would be glad to arrange for her to go to [facility] for reevaluation of her chronic abdominal pain. I did call  [facility] to talk with Dr. Sherwood Gambler but he was not available today. #2. Chronic constipation. She is doing better with therapy. Her constipation is secondary to medications.    Plan:  Patient given prescription for oxycodone 10 mg 3 times a day at when necessary 10 doses without refill. She was advised to call Dr. Thyra Breed office tomorrow to reschedule her appointment.

## 2013-03-02 DIAGNOSIS — M47812 Spondylosis without myelopathy or radiculopathy, cervical region: Secondary | ICD-10-CM | POA: Diagnosis not present

## 2013-03-02 DIAGNOSIS — G894 Chronic pain syndrome: Secondary | ICD-10-CM | POA: Diagnosis not present

## 2013-03-02 DIAGNOSIS — IMO0002 Reserved for concepts with insufficient information to code with codable children: Secondary | ICD-10-CM | POA: Diagnosis not present

## 2013-03-02 DIAGNOSIS — Z79899 Other long term (current) drug therapy: Secondary | ICD-10-CM | POA: Diagnosis not present

## 2013-03-07 DIAGNOSIS — Z23 Encounter for immunization: Secondary | ICD-10-CM | POA: Diagnosis not present

## 2013-03-29 DIAGNOSIS — G894 Chronic pain syndrome: Secondary | ICD-10-CM | POA: Diagnosis not present

## 2013-03-29 DIAGNOSIS — Z79899 Other long term (current) drug therapy: Secondary | ICD-10-CM | POA: Diagnosis not present

## 2013-03-29 DIAGNOSIS — M47812 Spondylosis without myelopathy or radiculopathy, cervical region: Secondary | ICD-10-CM | POA: Diagnosis not present

## 2013-04-04 DIAGNOSIS — N390 Urinary tract infection, site not specified: Secondary | ICD-10-CM | POA: Diagnosis not present

## 2013-04-04 DIAGNOSIS — Z6831 Body mass index (BMI) 31.0-31.9, adult: Secondary | ICD-10-CM | POA: Diagnosis not present

## 2013-04-11 ENCOUNTER — Other Ambulatory Visit: Payer: Self-pay

## 2013-04-11 MED ORDER — ATORVASTATIN CALCIUM 20 MG PO TABS: 20.0000 mg | ORAL_TABLET | Freq: Every day | ORAL | Status: DC

## 2013-04-11 NOTE — Telephone Encounter (Signed)
Rx was sent to pharmacy electronically. 

## 2013-04-25 DIAGNOSIS — M47812 Spondylosis without myelopathy or radiculopathy, cervical region: Secondary | ICD-10-CM | POA: Diagnosis not present

## 2013-04-25 DIAGNOSIS — G894 Chronic pain syndrome: Secondary | ICD-10-CM | POA: Diagnosis not present

## 2013-04-25 DIAGNOSIS — M531 Cervicobrachial syndrome: Secondary | ICD-10-CM | POA: Diagnosis not present

## 2013-04-25 DIAGNOSIS — Z79899 Other long term (current) drug therapy: Secondary | ICD-10-CM | POA: Diagnosis not present

## 2013-05-29 DIAGNOSIS — M47812 Spondylosis without myelopathy or radiculopathy, cervical region: Secondary | ICD-10-CM | POA: Diagnosis not present

## 2013-05-29 DIAGNOSIS — G894 Chronic pain syndrome: Secondary | ICD-10-CM | POA: Diagnosis not present

## 2013-05-29 DIAGNOSIS — Z79899 Other long term (current) drug therapy: Secondary | ICD-10-CM | POA: Diagnosis not present

## 2013-05-29 DIAGNOSIS — M531 Cervicobrachial syndrome: Secondary | ICD-10-CM | POA: Diagnosis not present

## 2013-06-19 ENCOUNTER — Emergency Department (HOSPITAL_COMMUNITY): Payer: Medicare Other

## 2013-06-19 ENCOUNTER — Emergency Department (HOSPITAL_COMMUNITY)
Admission: EM | Admit: 2013-06-19 | Discharge: 2013-06-19 | Disposition: A | Discharge status: Home / Self Care | Payer: Medicare Other | Attending: Emergency Medicine | Admitting: Emergency Medicine

## 2013-06-19 ENCOUNTER — Encounter (HOSPITAL_COMMUNITY): Payer: Self-pay | Admitting: Emergency Medicine

## 2013-06-19 DIAGNOSIS — Z7982 Long term (current) use of aspirin: Secondary | ICD-10-CM | POA: Diagnosis not present

## 2013-06-19 DIAGNOSIS — F411 Generalized anxiety disorder: Secondary | ICD-10-CM | POA: Insufficient documentation

## 2013-06-19 DIAGNOSIS — R1032 Left lower quadrant pain: Secondary | ICD-10-CM | POA: Diagnosis not present

## 2013-06-19 DIAGNOSIS — F3289 Other specified depressive episodes: Secondary | ICD-10-CM | POA: Insufficient documentation

## 2013-06-19 DIAGNOSIS — R109 Unspecified abdominal pain: Secondary | ICD-10-CM | POA: Diagnosis not present

## 2013-06-19 DIAGNOSIS — G43909 Migraine, unspecified, not intractable, without status migrainosus: Secondary | ICD-10-CM | POA: Insufficient documentation

## 2013-06-19 DIAGNOSIS — I10 Essential (primary) hypertension: Secondary | ICD-10-CM | POA: Diagnosis not present

## 2013-06-19 DIAGNOSIS — Z87440 Personal history of urinary (tract) infections: Secondary | ICD-10-CM | POA: Insufficient documentation

## 2013-06-19 DIAGNOSIS — E119 Type 2 diabetes mellitus without complications: Secondary | ICD-10-CM | POA: Diagnosis not present

## 2013-06-19 DIAGNOSIS — I2510 Atherosclerotic heart disease of native coronary artery without angina pectoris: Secondary | ICD-10-CM | POA: Diagnosis not present

## 2013-06-19 DIAGNOSIS — Z862 Personal history of diseases of the blood and blood-forming organs and certain disorders involving the immune mechanism: Secondary | ICD-10-CM | POA: Insufficient documentation

## 2013-06-19 DIAGNOSIS — F329 Major depressive disorder, single episode, unspecified: Secondary | ICD-10-CM | POA: Insufficient documentation

## 2013-06-19 DIAGNOSIS — Z8669 Personal history of other diseases of the nervous system and sense organs: Secondary | ICD-10-CM | POA: Insufficient documentation

## 2013-06-19 DIAGNOSIS — Z8742 Personal history of other diseases of the female genital tract: Secondary | ICD-10-CM | POA: Diagnosis not present

## 2013-06-19 DIAGNOSIS — Z6831 Body mass index (BMI) 31.0-31.9, adult: Secondary | ICD-10-CM | POA: Diagnosis not present

## 2013-06-19 DIAGNOSIS — G8929 Other chronic pain: Secondary | ICD-10-CM | POA: Diagnosis not present

## 2013-06-19 DIAGNOSIS — Z87891 Personal history of nicotine dependence: Secondary | ICD-10-CM | POA: Insufficient documentation

## 2013-06-19 DIAGNOSIS — Z95818 Presence of other cardiac implants and grafts: Secondary | ICD-10-CM | POA: Diagnosis not present

## 2013-06-19 DIAGNOSIS — R1011 Right upper quadrant pain: Secondary | ICD-10-CM | POA: Insufficient documentation

## 2013-06-19 DIAGNOSIS — Z90710 Acquired absence of both cervix and uterus: Secondary | ICD-10-CM | POA: Insufficient documentation

## 2013-06-19 DIAGNOSIS — Z8719 Personal history of other diseases of the digestive system: Secondary | ICD-10-CM | POA: Diagnosis not present

## 2013-06-19 DIAGNOSIS — Z79899 Other long term (current) drug therapy: Secondary | ICD-10-CM | POA: Insufficient documentation

## 2013-06-19 DIAGNOSIS — Z880 Allergy status to penicillin: Secondary | ICD-10-CM | POA: Diagnosis not present

## 2013-06-19 DIAGNOSIS — N200 Calculus of kidney: Secondary | ICD-10-CM | POA: Diagnosis not present

## 2013-06-19 LAB — URINALYSIS, ROUTINE W REFLEX MICROSCOPIC
BILIRUBIN URINE: NEGATIVE
Glucose, UA: NEGATIVE mg/dL
Hgb urine dipstick: NEGATIVE
KETONES UR: NEGATIVE mg/dL
Leukocytes, UA: NEGATIVE
Nitrite: NEGATIVE
PROTEIN: NEGATIVE mg/dL
Specific Gravity, Urine: 1.01 (ref 1.005–1.030)
Urobilinogen, UA: 0.2 mg/dL (ref 0.0–1.0)
pH: 5.5 (ref 5.0–8.0)

## 2013-06-19 LAB — COMPREHENSIVE METABOLIC PANEL
ALK PHOS: 80 U/L (ref 39–117)
ALT: 72 U/L — AB (ref 0–35)
AST: 81 U/L — ABNORMAL HIGH (ref 0–37)
Albumin: 4.5 g/dL (ref 3.5–5.2)
BILIRUBIN TOTAL: 0.4 mg/dL (ref 0.3–1.2)
BUN: 13 mg/dL (ref 6–23)
CALCIUM: 10.4 mg/dL (ref 8.4–10.5)
CO2: 30 mEq/L (ref 19–32)
CREATININE: 0.62 mg/dL (ref 0.50–1.10)
Chloride: 99 mEq/L (ref 96–112)
Glucose, Bld: 152 mg/dL — ABNORMAL HIGH (ref 70–99)
Potassium: 4.2 mEq/L (ref 3.7–5.3)
Sodium: 140 mEq/L (ref 137–147)
Total Protein: 8.5 g/dL — ABNORMAL HIGH (ref 6.0–8.3)

## 2013-06-19 LAB — CBC WITH DIFFERENTIAL/PLATELET
BASOS ABS: 0 10*3/uL (ref 0.0–0.1)
BASOS PCT: 1 % (ref 0–1)
Eosinophils Absolute: 0.2 10*3/uL (ref 0.0–0.7)
Eosinophils Relative: 3 % (ref 0–5)
HEMATOCRIT: 44.8 % (ref 36.0–46.0)
Hemoglobin: 15.4 g/dL — ABNORMAL HIGH (ref 12.0–15.0)
Lymphocytes Relative: 30 % (ref 12–46)
Lymphs Abs: 1.8 10*3/uL (ref 0.7–4.0)
MCH: 28.7 pg (ref 26.0–34.0)
MCHC: 34.4 g/dL (ref 30.0–36.0)
MCV: 83.4 fL (ref 78.0–100.0)
MONO ABS: 0.5 10*3/uL (ref 0.1–1.0)
Monocytes Relative: 8 % (ref 3–12)
NEUTROS ABS: 3.4 10*3/uL (ref 1.7–7.7)
Neutrophils Relative %: 59 % (ref 43–77)
PLATELETS: 242 10*3/uL (ref 150–400)
RBC: 5.37 MIL/uL — ABNORMAL HIGH (ref 3.87–5.11)
RDW: 12.8 % (ref 11.5–15.5)
WBC: 5.8 10*3/uL (ref 4.0–10.5)

## 2013-06-19 LAB — LIPASE, BLOOD: Lipase: 43 U/L (ref 11–59)

## 2013-06-19 MED ORDER — IOHEXOL 300 MG/ML  SOLN
100.0000 mL | Freq: Once | INTRAMUSCULAR | Status: AC | PRN
  Administered 2013-06-19: 100 mL via INTRAVENOUS

## 2013-06-19 MED ORDER — IOHEXOL 300 MG/ML  SOLN
50.0000 mL | Freq: Once | INTRAMUSCULAR | Status: AC | PRN
  Administered 2013-06-19: 50 mL via ORAL

## 2013-06-19 MED ORDER — MORPHINE SULFATE 4 MG/ML IJ SOLN
4.0000 mg | Freq: Once | INTRAMUSCULAR | Status: AC
  Administered 2013-06-19: 4 mg via INTRAVENOUS
  Filled 2013-06-19: qty 1

## 2013-06-19 MED ORDER — SODIUM CHLORIDE 0.9 % IV BOLUS (SEPSIS)
1000.0000 mL | Freq: Once | INTRAVENOUS | Status: AC
  Administered 2013-06-19: 1000 mL via INTRAVENOUS

## 2013-06-19 MED ORDER — ONDANSETRON HCL 4 MG/2ML IJ SOLN
4.0000 mg | Freq: Once | INTRAMUSCULAR | Status: AC
  Administered 2013-06-19: 4 mg via INTRAVENOUS
  Filled 2013-06-19: qty 2

## 2013-06-19 NOTE — ED Notes (Signed)
Pt c/o bilateral lower abd pain with distention that started several months ago but became worse over the past 2-3 weeks. Pt states that she went to see [facility] this am and was suppose to go to er but went to Dr. Olevia Perches office but was sent to er from there. Pt reports normal bowel movement three times a week, denies any constipation or rectal bleeding, does admits to "some" vaginal spotting that is intermittent.
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
## 2013-06-19 NOTE — ED Notes (Signed)
Patient given discharge instruction, verbalized understand. IV removed, band aid applied. Patient ambulatory out of the department.  

## 2013-06-19 NOTE — Discharge Instructions (Signed)
Liver functions are mildly elevated. Glucose was 152.  CT scan shows constipation.  Increase fluids, fruits, fibers. Prune juice. Followup your primary care Dr.

## 2013-06-19 NOTE — ED Notes (Signed)
Abdominal pain and swelling for the past 2 weeks, states she went to Dr Otelia Limes office and was asked to come to the ED

## 2013-06-19 NOTE — ED Notes (Signed)
Patient ambulatory to restroom with assistance, clean catch instructions given and advised pt to bring specimen back to room as well. 

## 2013-06-19 NOTE — ED Notes (Signed)
Dr Cook at bedside,  

## 2013-06-21 ENCOUNTER — Ambulatory Visit (INDEPENDENT_AMBULATORY_CARE_PROVIDER_SITE_OTHER): Payer: Medicare Other | Admitting: Internal Medicine

## 2013-06-21 ENCOUNTER — Encounter (INDEPENDENT_AMBULATORY_CARE_PROVIDER_SITE_OTHER): Payer: Self-pay | Admitting: Internal Medicine

## 2013-06-21 ENCOUNTER — Telehealth (INDEPENDENT_AMBULATORY_CARE_PROVIDER_SITE_OTHER): Payer: Self-pay | Admitting: *Deleted

## 2013-06-21 ENCOUNTER — Other Ambulatory Visit (INDEPENDENT_AMBULATORY_CARE_PROVIDER_SITE_OTHER): Payer: Self-pay | Admitting: *Deleted

## 2013-06-21 ENCOUNTER — Encounter (HOSPITAL_COMMUNITY): Payer: Self-pay | Admitting: Pharmacy Technician

## 2013-06-21 VITALS — BP 100/48 | HR 64 | Temp 97.9°F | Ht 65.0 in | Wt 160.1 lb

## 2013-06-21 DIAGNOSIS — R109 Unspecified abdominal pain: Secondary | ICD-10-CM | POA: Diagnosis not present

## 2013-06-21 DIAGNOSIS — Z1211 Encounter for screening for malignant neoplasm of colon: Secondary | ICD-10-CM

## 2013-06-21 DIAGNOSIS — R748 Abnormal levels of other serum enzymes: Secondary | ICD-10-CM

## 2013-06-21 LAB — HEPATIC FUNCTION PANEL
ALT: 40 U/L — AB (ref 0–35)
AST: 27 U/L (ref 0–37)
Albumin: 4.2 g/dL (ref 3.5–5.2)
Alkaline Phosphatase: 76 U/L (ref 39–117)
BILIRUBIN DIRECT: 0.2 mg/dL (ref 0.0–0.3)
Indirect Bilirubin: 0.3 mg/dL (ref 0.0–0.9)
Total Bilirubin: 0.5 mg/dL (ref 0.3–1.2)
Total Protein: 7 g/dL (ref 6.0–8.3)

## 2013-06-21 MED ORDER — PEG 3350-KCL-NA BICARB-NACL 420 G PO SOLR
4000.0000 mL | Freq: Once | ORAL | Status: DC
Start: 2013-06-21 — End: 2013-07-02

## 2013-06-21 NOTE — Patient Instructions (Signed)
Colonoscopy with Dr. Rehman 

## 2013-06-21 NOTE — Telephone Encounter (Signed)
Patient needs trilyte 

## 2013-06-21 NOTE — Progress Notes (Addendum)
Subjective:     Patient ID: Anita [name], female   DOB: 1960-10-06, 53 y.o.   MRN: [id number]  HPI Here today for f/u after recent visit to the ED for abdominal pain. CT did not show any acute findings. She was last seen in our office by Dr. Laural Golden for chronic abdominal pain.  She tells me today she continues to hurt in her rt abdomen. She says her abdomen is distended. She had a large BM yesterday. She also took a suppository but no results. She says she saw some blood last night.  Appetite is okay. She has gained 2 pounds since her last visit. She has frequent nausea. She has a BM 2-3 times a week. She see Dr Hardin Negus in [place] for chronic pain. Hx of diabetes x 12 yrs. Blood sugars run 154-200. 06/19/2013 CT abdomen/pelvis with CM: IMPRESSION:  No acute intra-abdominal or pelvic finding by CT.  Punctate nonobstructing left lower pole renal calculus.  Retained stool throughout the colon can be seen with constipation  Normal appendix  Prior hysterectomy No tatoos  05/05/2012 Colonoscopy: Impression:  Incomplete exam secondary to poor prep.  No stricture or mass identified at sigmoid colon or rectosigmoid junction.  Small external hemorrhoids.  Suspect she has narcotic-induced constipation.  CMP     Component Value Date/Time   NA 140 06/19/2013 1417   K 4.2 06/19/2013 1417   CL 99 06/19/2013 1417   CO2 30 06/19/2013 1417   GLUCOSE 152* 06/19/2013 1417   BUN 13 06/19/2013 1417   CREATININE 0.62 06/19/2013 1417   CALCIUM 10.4 06/19/2013 1417   PROT 8.5* 06/19/2013 1417   ALBUMIN 4.5 06/19/2013 1417   AST 81* 06/19/2013 1417   ALT 72* 06/19/2013 1417   ALKPHOS 80 06/19/2013 1417   BILITOT 0.4 06/19/2013 1417   GFRNONAA >90 06/19/2013 1417   GFRAA >90 06/19/2013 1417    CBC    Component Value Date/Time   WBC 5.8 06/19/2013 1417   RBC 5.37* 06/19/2013 1417   HGB 15.4* 06/19/2013 1417   HCT 44.8 06/19/2013 1417   PLT 242 06/19/2013 1417   MCV 83.4 06/19/2013 1417   MCH 28.7 06/19/2013  1417   MCHC 34.4 06/19/2013 1417   RDW 12.8 06/19/2013 1417   LYMPHSABS 1.8 06/19/2013 1417   MONOABS 0.5 06/19/2013 1417   EOSABS 0.2 06/19/2013 1417   BASOSABS 0.0 06/19/2013 1417      Review of Systems Current Outpatient Prescriptions  Medication Sig Dispense Refill  . ALPHA LIPOIC ACID PO Take 1 capsule by mouth daily.      Marland Kitchen ALPRAZolam (XANAX) 1 MG tablet Take 1 mg by mouth 4 (four) times daily.       Marland Kitchen amLODipine (NORVASC) 10 MG tablet Take 10 mg by mouth daily.      Marland Kitchen aspirin EC 81 MG tablet Take 81 mg by mouth daily.      Marland Kitchen atorvastatin (LIPITOR) 20 MG tablet Take 1 tablet (20 mg total) by mouth daily at 6 PM.  30 tablet  5  . docusate sodium (COLACE) 100 MG capsule Take 100 mg by mouth 2 (two) times daily.      Marland Kitchen FLUoxetine (PROZAC) 20 MG capsule Take 20 mg by mouth daily.      Marland Kitchen gabapentin (NEURONTIN) 400 MG capsule Take 400 mg by mouth 4 (four) times daily.      Marland Kitchen glyBURIDE-metformin (GLUCOVANCE) 5-500 MG per tablet Take 1 tablet by mouth 3 (three) times daily.       Marland Kitchen  isosorbide mononitrate (IMDUR) 60 MG 24 hr tablet Take 60 mg by mouth every evening.      . methadone (DOLOPHINE) 5 MG tablet Take 5 mg by mouth 2 (two) times daily.      . metoprolol (LOPRESSOR) 100 MG tablet Take 100 mg by mouth 2 (two) times daily.      . nitroGLYCERIN (NITROSTAT) 0.4 MG SL tablet Place 0.4 mg under the tongue every 5 (five) minutes as needed for chest pain.      Marland Kitchen oxyCODONE-acetaminophen (PERCOCET/ROXICET) 5-325 MG per tablet Take 1 tablet by mouth every 8 (eight) hours as needed for pain.      . Probiotic Product (HEALTHY COLON PO) Take by mouth at bedtime.      . ramipril (ALTACE) 10 MG capsule Take 20 mg by mouth daily.        No current facility-administered medications for this visit.   Past Surgical History  Procedure Laterality Date  . Leg surgery    . Asd repair    . Neck surgery    . Abdominal hysterectomy    . Cardiac catheterization    . Colonoscopy  05/05/2012    Procedure:  COLONOSCOPY;  Surgeon: Rogene Houston, MD;  [facility];  Service: Endoscopy;  Laterality: N/A;  830  . Wisdom tooth extraction      x 1  . Laparoscopy  07/03/2012    Procedure: LAPAROSCOPY OPERATIVE;  Surgeon: Margarette Asal, MD;  Location: [facility];  Service: Gynecology;  Laterality: N/A;  . Salpingoophorectomy  07/03/2012    Procedure: SALPINGO OOPHORECTOMY;  Surgeon: Margarette Asal, MD;  Location: [facility];  Service: Gynecology;  Laterality: Bilateral;  . Abdominal surgery     Allergies  Allergen Reactions  . Imitrex [Sumatriptan] Swelling    States she had swelling in neck  . Lyrica [Pregabalin] Other (See Comments)    Severe Leg and feet swelling  . Penicillins Rash        Objective:   Physical Exam  Filed Vitals:   06/21/13 0932  BP: 100/48  Pulse: 64  Temp: 97.9 F (36.6 C)  Height: 5\' 5"  (1.651 m)  Weight: 160 lb 1.6 oz (72.621 kg)  Alert. Skin warm and dry. Oral mucosa is moist. Lungs are clear. Heart rate is regular. Abdomen is soft. BS+. Diffuse tenderness to abdomen.  No edema to lower extremities. Stool brown, soft and guaiac negative.       Assessment:    Chronic abdominal. She gives a hx of rectal bleeding yesterday while trying to have a BM. Suspect bleeding was related to her constipation.    Slightly elevated liver enzymes in ED Monday. Last colonoscopy in 2013 which was incomplete Plan:    Colonoscopy with Dr Laural Golden in [place].   Hepatic function.

## 2013-06-22 ENCOUNTER — Other Ambulatory Visit (HOSPITAL_COMMUNITY): Payer: Self-pay | Admitting: Cardiology

## 2013-06-22 NOTE — Telephone Encounter (Signed)
Rx was sent to pharmacy electronically. 

## 2013-06-24 LAB — CULTURE, BLOOD (ROUTINE X 2)
CULTURE: NO GROWTH
Culture: NO GROWTH

## 2013-06-26 NOTE — Patient Instructions (Signed)
Anita [name]  06/26/2013   Your procedure is scheduled on:   07/02/2013  Report to [facility] at  74 AM.  Call this number if you have problems the morning of surgery: [phone]   Remember:   Do not eat food or drink liquids after midnight.   Take these medicines the morning of surgery with A SIP OF WATER: xanax, amlodipine, gabapentin, isosorbide, methadone, metoprolol, oxycodone, ramipril   Do not wear jewelry, make-up or nail polish.  Do not wear lotions, powders, or perfumes.   Do not shave 48 hours prior to surgery. Men may shave face and neck.  Do not bring valuables to the hospital.  [facility] is not responsible for any belongings or valuables.               Contacts, dentures or bridgework may not be worn into surgery.  Leave suitcase in the car. After surgery it may be brought to your room.  For patients admitted to the hospital, discharge time is determined by your treatment team.               Patients discharged the day of surgery will not be allowed to drive home.  Name and phone number of your driver: family  Special Instructions: N/A   Please read over the following fact sheets that you were given: Pain Booklet, Coughing and Deep Breathing, Surgical Site Infection Prevention, Anesthesia Post-op Instructions and Care and Recovery After Surgery Colonoscopy A colonoscopy is an exam to look at the entire large intestine (colon). This exam can help find problems such as tumors, polyps, inflammation, and areas of bleeding. The exam takes about 1 hour.  LET [facility] CARE PROVIDER KNOW ABOUT:   Any allergies you have.  All medicines you are taking, including vitamins, herbs, eye drops, creams, and over-the-counter medicines.  Previous problems you or members of your family have had with the use of anesthetics.  Any blood disorders you have.  Previous surgeries you have had.  Medical conditions you have. RISKS AND COMPLICATIONS  Generally, this is a safe  procedure. However, as with any procedure, complications can occur. Possible complications include:  Bleeding.  Tearing or rupture of the colon wall.  Reaction to medicines given during the exam.  Infection (rare). BEFORE THE PROCEDURE   Ask your health care provider about changing or stopping your regular medicines.  You may be prescribed an oral bowel prep. This involves drinking a large amount of medicated liquid, starting the day before your procedure. The liquid will cause you to have multiple loose stools until your stool is almost clear or light green. This cleans out your colon in preparation for the procedure.  Do not eat or drink anything else once you have started the bowel prep, unless your health care provider tells you it is safe to do so.  Arrange for someone to drive you home after the procedure. PROCEDURE   You will be given medicine to help you relax (sedative).  You will lie on your side with your knees bent.  A long, flexible tube with a light and camera on the end (colonoscope) will be inserted through the rectum and into the colon. The camera sends video back to a computer screen as it moves through the colon. The colonoscope also releases carbon dioxide gas to inflate the colon. This helps your health care provider see the area better.  During the exam, your health care provider may take a  small tissue sample (biopsy) to be examined under a microscope if any abnormalities are found.  The exam is finished when the entire colon has been viewed. AFTER THE PROCEDURE   Do not drive for 24 hours after the exam.  You may have a small amount of blood in your stool.  You may pass moderate amounts of gas and have mild abdominal cramping or bloating. This is caused by the gas used to inflate your colon during the exam.  Ask when your test results will be ready and how you will get your results. Make sure you get your test results. Document Released: 05/14/2000  Document Revised: 03/07/2013 Document Reviewed: 01/22/2013 [facility] Patient Information 2014 [place]. PATIENT INSTRUCTIONS POST-ANESTHESIA  IMMEDIATELY FOLLOWING SURGERY:  Do not drive or operate machinery for the first twenty four hours after surgery.  Do not make any important decisions for twenty four hours after surgery or while taking narcotic pain medications or sedatives.  If you develop intractable nausea and vomiting or a severe headache please notify your doctor immediately.  FOLLOW-UP:  Please make an appointment with your surgeon as instructed. You do not need to follow up with anesthesia unless specifically instructed to do so.  WOUND CARE INSTRUCTIONS (if applicable):  Keep a dry clean dressing on the anesthesia/puncture wound site if there is drainage.  Once the wound has quit draining you may leave it open to air.  Generally you should leave the bandage intact for twenty four hours unless there is drainage.  If the epidural site drains for more than 36-48 hours please call the anesthesia department.  QUESTIONS?:  Please feel free to call your physician or the hospital operator if you have any questions, and they will be happy to assist you.

## 2013-06-27 ENCOUNTER — Encounter (HOSPITAL_COMMUNITY): Payer: Self-pay

## 2013-06-27 ENCOUNTER — Encounter (HOSPITAL_COMMUNITY)
Admission: RE | Admit: 2013-06-27 | Discharge: 2013-06-27 | Disposition: A | Discharge status: Home / Self Care | Payer: Medicare Other | Source: Ambulatory Visit | Attending: Internal Medicine | Admitting: Internal Medicine

## 2013-06-27 ENCOUNTER — Other Ambulatory Visit: Payer: Self-pay

## 2013-06-27 DIAGNOSIS — Z01810 Encounter for preprocedural cardiovascular examination: Secondary | ICD-10-CM | POA: Insufficient documentation

## 2013-06-27 DIAGNOSIS — Z01812 Encounter for preprocedural laboratory examination: Secondary | ICD-10-CM | POA: Insufficient documentation

## 2013-06-27 DIAGNOSIS — Z01818 Encounter for other preprocedural examination: Secondary | ICD-10-CM | POA: Diagnosis not present

## 2013-06-27 LAB — HEMOGLOBIN AND HEMATOCRIT, BLOOD
HCT: 39.7 % (ref 36.0–46.0)
HEMOGLOBIN: 13.5 g/dL (ref 12.0–15.0)

## 2013-06-27 LAB — BASIC METABOLIC PANEL
BUN: 13 mg/dL (ref 6–23)
CHLORIDE: 98 meq/L (ref 96–112)
CO2: 29 mEq/L (ref 19–32)
CREATININE: 0.59 mg/dL (ref 0.50–1.10)
Calcium: 9.4 mg/dL (ref 8.4–10.5)
GFR calc Af Amer: >90 mL/min (ref >90)
GFR calc non Af Amer: >90 mL/min (ref >90)
Glucose, Bld: 200 mg/dL — ABNORMAL HIGH (ref 70–99)
Potassium: 4.7 mEq/L (ref 3.7–5.3)
Sodium: 140 mEq/L (ref 137–147)

## 2013-06-27 NOTE — Progress Notes (Signed)
06/27/13 1049  OBSTRUCTIVE SLEEP APNEA  Have you ever been diagnosed with sleep apnea through a sleep study? No  Do you snore loudly (loud enough to be heard through closed doors)?  1  Do you often feel tired, fatigued, or sleepy during the daytime? 1  Has anyone observed you stop breathing during your sleep? 1  Do you have, or are you being treated for high blood pressure? 1  BMI more than 35 kg/m2? 0  Age over 53 years old? 1  Neck circumference greater than 40 cm/18 inches? 0  Gender: 0  Obstructive Sleep Apnea Score 5

## 2013-07-02 ENCOUNTER — Encounter (HOSPITAL_COMMUNITY): Payer: Self-pay | Admitting: Anesthesiology

## 2013-07-02 ENCOUNTER — Ambulatory Visit (HOSPITAL_COMMUNITY): Payer: Medicare Other | Admitting: Anesthesiology

## 2013-07-02 ENCOUNTER — Encounter (HOSPITAL_COMMUNITY)
Admission: RE | Disposition: A | Discharge status: Home Health | Payer: Self-pay | Source: Ambulatory Visit | Attending: Internal Medicine

## 2013-07-02 ENCOUNTER — Encounter (HOSPITAL_COMMUNITY): Payer: Medicare Other | Admitting: Anesthesiology

## 2013-07-02 ENCOUNTER — Encounter: Payer: Self-pay | Admitting: Obstetrics & Gynecology

## 2013-07-02 ENCOUNTER — Ambulatory Visit (HOSPITAL_COMMUNITY)
Admission: RE | Admit: 2013-07-02 | Discharge: 2013-07-02 | Disposition: A | Discharge status: Home Health | Payer: Medicare Other | Source: Ambulatory Visit | Attending: Internal Medicine | Admitting: Internal Medicine

## 2013-07-02 DIAGNOSIS — Z79899 Other long term (current) drug therapy: Secondary | ICD-10-CM | POA: Diagnosis not present

## 2013-07-02 DIAGNOSIS — R1032 Left lower quadrant pain: Secondary | ICD-10-CM | POA: Insufficient documentation

## 2013-07-02 DIAGNOSIS — R109 Unspecified abdominal pain: Secondary | ICD-10-CM | POA: Diagnosis not present

## 2013-07-02 DIAGNOSIS — F329 Major depressive disorder, single episode, unspecified: Secondary | ICD-10-CM | POA: Diagnosis not present

## 2013-07-02 DIAGNOSIS — K921 Melena: Secondary | ICD-10-CM | POA: Diagnosis not present

## 2013-07-02 DIAGNOSIS — F3289 Other specified depressive episodes: Secondary | ICD-10-CM | POA: Insufficient documentation

## 2013-07-02 DIAGNOSIS — Z01812 Encounter for preprocedural laboratory examination: Secondary | ICD-10-CM | POA: Insufficient documentation

## 2013-07-02 DIAGNOSIS — Z538 Procedure and treatment not carried out for other reasons: Secondary | ICD-10-CM | POA: Diagnosis not present

## 2013-07-02 DIAGNOSIS — E119 Type 2 diabetes mellitus without complications: Secondary | ICD-10-CM | POA: Diagnosis not present

## 2013-07-02 DIAGNOSIS — K5900 Constipation, unspecified: Secondary | ICD-10-CM | POA: Diagnosis not present

## 2013-07-02 DIAGNOSIS — Z7982 Long term (current) use of aspirin: Secondary | ICD-10-CM | POA: Diagnosis not present

## 2013-07-02 DIAGNOSIS — I10 Essential (primary) hypertension: Secondary | ICD-10-CM | POA: Insufficient documentation

## 2013-07-02 HISTORY — PX: COLONOSCOPY WITH PROPOFOL: SHX5780

## 2013-07-02 LAB — GLUCOSE, CAPILLARY: GLUCOSE-CAPILLARY: 164 mg/dL — AB (ref 70–99)

## 2013-07-02 SURGERY — COLONOSCOPY WITH PROPOFOL
Anesthesia: Monitor Anesthesia Care

## 2013-07-02 MED ORDER — ONDANSETRON HCL 4 MG/2ML IJ SOLN
4.0000 mg | Freq: Once | INTRAMUSCULAR | Status: AC
  Administered 2013-07-02: 4 mg via INTRAVENOUS

## 2013-07-02 MED ORDER — FENTANYL CITRATE 0.05 MG/ML IJ SOLN
INTRAMUSCULAR | Status: AC
  Filled 2013-07-02: qty 2

## 2013-07-02 MED ORDER — LIDOCAINE HCL (PF) 1 % IJ SOLN
INTRAMUSCULAR | Status: AC
  Filled 2013-07-02: qty 5

## 2013-07-02 MED ORDER — MIDAZOLAM HCL 2 MG/2ML IJ SOLN
1.0000 mg | INTRAMUSCULAR | Status: DC | PRN
  Administered 2013-07-02: 2 mg via INTRAVENOUS

## 2013-07-02 MED ORDER — STERILE WATER FOR IRRIGATION IR SOLN
Status: DC | PRN
  Administered 2013-07-02: 08:00:00

## 2013-07-02 MED ORDER — GLYCOPYRROLATE 0.2 MG/ML IJ SOLN
INTRAMUSCULAR | Status: AC
  Filled 2013-07-02: qty 1

## 2013-07-02 MED ORDER — GLYCOPYRROLATE 0.2 MG/ML IJ SOLN
0.2000 mg | Freq: Once | INTRAMUSCULAR | Status: AC
  Administered 2013-07-02: 0.2 mg via INTRAVENOUS

## 2013-07-02 MED ORDER — MIDAZOLAM HCL 2 MG/2ML IJ SOLN
INTRAMUSCULAR | Status: AC
  Filled 2013-07-02: qty 2

## 2013-07-02 MED ORDER — ONDANSETRON HCL 4 MG/2ML IJ SOLN
INTRAMUSCULAR | Status: AC
  Filled 2013-07-02: qty 2

## 2013-07-02 MED ORDER — FENTANYL CITRATE 0.05 MG/ML IJ SOLN
25.0000 ug | INTRAMUSCULAR | Status: AC
  Administered 2013-07-02 (×2): 25 ug via INTRAVENOUS

## 2013-07-02 MED ORDER — HYDROCORTISONE ACETATE 25 MG RE SUPP: 25.0000 mg | Freq: Every day | RECTAL | Status: DC

## 2013-07-02 MED ORDER — ONDANSETRON HCL 4 MG/2ML IJ SOLN: 4.0000 mg | Freq: Once | INTRAMUSCULAR | Status: DC | PRN

## 2013-07-02 MED ORDER — PROPOFOL 10 MG/ML IV EMUL
INTRAVENOUS | Status: AC
  Filled 2013-07-02: qty 20

## 2013-07-02 MED ORDER — PROPOFOL 10 MG/ML IV BOLUS
INTRAVENOUS | Status: DC | PRN
  Administered 2013-07-02: 20 mg via INTRAVENOUS

## 2013-07-02 MED ORDER — FENTANYL CITRATE 0.05 MG/ML IJ SOLN
INTRAMUSCULAR | Status: DC | PRN
  Administered 2013-07-02: 25 ug via INTRAVENOUS

## 2013-07-02 MED ORDER — LACTATED RINGERS IV SOLN
INTRAVENOUS | Status: DC | PRN
  Administered 2013-07-02: 07:00:00 via INTRAVENOUS

## 2013-07-02 MED ORDER — FENTANYL CITRATE 0.05 MG/ML IJ SOLN: 25.0000 ug | INTRAMUSCULAR | Status: DC | PRN

## 2013-07-02 MED ORDER — PROPOFOL INFUSION 10 MG/ML OPTIME
INTRAVENOUS | Status: DC | PRN
  Administered 2013-07-02: 100 ug/kg/min via INTRAVENOUS

## 2013-07-02 MED ORDER — LACTATED RINGERS IV SOLN
INTRAVENOUS | Status: DC
  Administered 2013-07-02: 07:00:00 via INTRAVENOUS

## 2013-07-02 SURGICAL SUPPLY — 25 items

## 2013-07-02 NOTE — Op Note (Signed)
Patient stated " it is fine to discuss results of test with husband"

## 2013-07-02 NOTE — Discharge Instructions (Signed)
Resume usual medications and diet. Anusol-HC Suppository one per rectum daily at bedtime for 2 weeks. No driving for 24 hours.  Colonoscopy, Care After Refer to this sheet in the next few weeks. These instructions provide you with information on caring for yourself after your procedure. Your health care provider may also give you more specific instructions. Your treatment has been planned according to current medical practices, but problems sometimes occur. Call your health care provider if you have any problems or questions after your procedure. WHAT TO EXPECT AFTER THE PROCEDURE  After your procedure, it is typical to have the following:  A small amount of blood in your stool.  Moderate amounts of gas and mild abdominal cramping or bloating. HOME CARE INSTRUCTIONS  Do not drive, operate machinery, or sign important documents for 24 hours.  You may shower and resume your regular physical activities, but move at a slower pace for the first 24 hours.  Take frequent rest periods for the first 24 hours.  Walk around or put a warm pack on your abdomen to help reduce abdominal cramping and bloating.  Drink enough fluids to keep your urine clear or pale yellow.  You may resume your normal diet as instructed by your health care provider. Avoid heavy or fried foods that are hard to digest.  Avoid drinking alcohol for 24 hours or as instructed by your health care provider.  Only take over-the-counter or prescription medicines as directed by your health care provider.  If a tissue sample (biopsy) was taken during your procedure:  Do not take aspirin or blood thinners for 7 days, or as instructed by your health care provider.  Do not drink alcohol for 7 days, or as instructed by your health care provider.  Eat soft foods for the first 24 hours. SEEK MEDICAL CARE IF: You have persistent spotting of blood in your stool 2 3 days after the procedure. SEEK IMMEDIATE MEDICAL CARE IF:  You  have more than a small spotting of blood in your stool.  You pass large blood clots in your stool.  Your abdomen is swollen (distended).  You have nausea or vomiting.  You have a fever.  You have increasing abdominal pain that is not relieved with medicine.   Hemorrhoids Hemorrhoids are swollen veins around the rectum or anus. There are two types of hemorrhoids:   Internal hemorrhoids. These occur in the veins just inside the rectum. They may poke through to the outside and become irritated and painful.  External hemorrhoids. These occur in the veins outside the anus and can be felt as a painful swelling or hard lump near the anus. CAUSES  Pregnancy.   Obesity.   Constipation or diarrhea.   Straining to have a bowel movement.   Sitting for long periods on the toilet.  Heavy lifting or other activity that caused you to strain.  Anal intercourse. SYMPTOMS   Pain.   Anal itching or irritation.   Rectal bleeding.   Fecal leakage.   Anal swelling.   One or more lumps around the anus.  DIAGNOSIS  Your caregiver may be able to diagnose hemorrhoids by visual examination. Other examinations or tests that may be performed include:   Examination of the rectal area with a gloved hand (digital rectal exam).   Examination of anal canal using a small tube (scope).   A blood test if you have lost a significant amount of blood.  A test to look inside the colon (sigmoidoscopy or colonoscopy). TREATMENT  Most hemorrhoids can be treated at home. However, if symptoms do not seem to be getting better or if you have a lot of rectal bleeding, your caregiver may perform a procedure to help make the hemorrhoids get smaller or remove them completely. Possible treatments include:   Placing a rubber band at the base of the hemorrhoid to cut off the circulation (rubber band ligation).   Injecting a chemical to shrink the hemorrhoid (sclerotherapy).   Using a tool to  burn the hemorrhoid (infrared light therapy).   Surgically removing the hemorrhoid (hemorrhoidectomy).   Stapling the hemorrhoid to block blood flow to the tissue (hemorrhoid stapling).  HOME CARE INSTRUCTIONS   Eat foods with fiber, such as whole grains, beans, nuts, fruits, and vegetables. Ask your doctor about taking products with added fiber in them (fibersupplements).  Increase fluid intake. Drink enough water and fluids to keep your urine clear or pale yellow.   Exercise regularly.   Go to the bathroom when you have the urge to have a bowel movement. Do not wait.   Avoid straining to have bowel movements.   Keep the anal area dry and clean. Use wet toilet paper or moist towelettes after a bowel movement.   Medicated creams and suppositories may be used or applied as directed.   Only take over-the-counter or prescription medicines as directed by your caregiver.   Take warm sitz baths for 15 20 minutes, 3 4 times a day to ease pain and discomfort.   Place ice packs on the hemorrhoids if they are tender and swollen. Using ice packs between sitz baths may be helpful.   Put ice in a plastic bag.   Place a towel between your skin and the bag.   Leave the ice on for 15 20 minutes, 3 4 times a day.   Do not use a donut-shaped pillow or sit on the toilet for long periods. This increases blood pooling and pain.  SEEK MEDICAL CARE IF:  You have increasing pain and swelling that is not controlled by treatment or medicine.  You have uncontrolled bleeding.  You have difficulty or you are unable to have a bowel movement.  You have pain or inflammation outside the area of the hemorrhoids. MAKE SURE YOU:  Understand these instructions.  Will watch your condition.  Will get help right away if you are not doing well or get worse. Document Released: 05/14/2000 Document Revised: 05/03/2012 Document Reviewed: 03/21/2012 [facility] Patient Information 2014 [place].

## 2013-07-02 NOTE — Anesthesia Postprocedure Evaluation (Signed)
  Anesthesia Post-op Note  Patient: Anita [name]  Procedure(s) Performed: Procedure(s): EXAM ABANDONED DUE TO PREP--UNABLE TO PERFORM COLONOSCOPY  (N/A)  Patient Location: PACU  Anesthesia Type:MAC  Level of Consciousness: awake, alert  and oriented  Airway and Oxygen Therapy: Patient Spontanous Breathing and Patient connected to face mask oxygen  Post-op Pain: none  Post-op Assessment: Post-op Vital signs reviewed, Patient's Cardiovascular Status Stable, Respiratory Function Stable, Patent Airway and No signs of Nausea or vomiting  Post-op Vital Signs: Reviewed and stable  Complications: No apparent anesthesia complications

## 2013-07-02 NOTE — Op Note (Signed)
COLONOSCOPY PROCEDURE REPORT  PATIENT:  Anita [name]  MR#:  [id number] Birthdate:  Mar 06, 1961, 53 y.o., female Endoscopist:  Dr. Rogene Houston, MD Referred By:  Dr. Sherrilee Gilles. Gerarda Fraction, MD Procedure Date: 07/02/2013  Procedure:   Colonoscopy  Indications:  Patient is 53 year old Caucasian female with multiple medical problems who presents with constipation LLQ abdominal pain and recent onset of hematochezia. Colonoscopy was attempted in December 2013 but abandoned because of poor prep.  Informed Consent:  The procedure and risks were reviewed with the patient and informed consent was obtained.  Medications:  Monitored anesthesia care. Please see anesthesia records or details.  Description of procedure:  After a digital rectal exam was performed, that colonoscope was advanced from the anus through the rectum and sigmoid colon. She had large amount of thick liquid stool along with formed stool. Scope was passed to about 40 cm where she had even larger pieces of formed stool. The endoscope was withdrawn..  Findings:   Poor prep therefore examination could not be performed.   Therapeutic/Diagnostic Maneuvers Performed:   None  Complications:  None  Cecal Withdrawal Time:  N/A   Impression:  Colonoscopy could not be performed secondary to poor prep.  Recommendations:  Standard instructions given. Anusol-HC Suppository one per rectum daily at bedtime for 2 weeks. Patient may have to be prepped in the day hospital and to supervision for third colonoscopy scheduled.  Keren Alverio U  07/02/2013 7:56 AM  CC: Dr. Glo Herring., MD & Dr. Rayne Du ref. provider found
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
## 2013-07-02 NOTE — Transfer of Care (Signed)
Immediate Anesthesia Transfer of Care Note  Patient: Anita [name]  Procedure(s) Performed: Procedure(s): EXAM ABANDONED DUE TO PREP--UNABLE TO PERFORM COLONOSCOPY  (N/A)  Patient Location: PACU  Anesthesia Type:MAC  Level of Consciousness: awake, alert  and oriented  Airway & Oxygen Therapy: Patient Spontanous Breathing and Patient connected to face mask oxygen  Post-op Assessment: Report given to PACU RN  Post vital signs: Reviewed and stable  Complications: No apparent anesthesia complications
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
## 2013-07-03 ENCOUNTER — Telehealth (INDEPENDENT_AMBULATORY_CARE_PROVIDER_SITE_OTHER): Payer: Self-pay | Admitting: *Deleted

## 2013-07-03 ENCOUNTER — Encounter (HOSPITAL_COMMUNITY): Payer: Self-pay | Admitting: Internal Medicine

## 2013-07-03 NOTE — Telephone Encounter (Signed)
Kennethia said Dr. Laural Golden couldn't do a complete TCS due to her not being cleaned out. Today, Anita [name] has been running to the bathroom and when she slowed down, she used a fleet emmenia. The only thing that came out was blood at that time. Her return phone number is [phone].

## 2013-07-04 NOTE — Telephone Encounter (Signed)
To be discussed with Dr.Rehman then the patient will be contacted.

## 2013-07-05 ENCOUNTER — Encounter: Payer: Self-pay | Admitting: Obstetrics & Gynecology

## 2013-07-09 ENCOUNTER — Encounter (HOSPITAL_COMMUNITY): Payer: Self-pay | Admitting: Emergency Medicine

## 2013-07-09 ENCOUNTER — Other Ambulatory Visit (INDEPENDENT_AMBULATORY_CARE_PROVIDER_SITE_OTHER): Payer: Self-pay | Admitting: *Deleted

## 2013-07-09 ENCOUNTER — Observation Stay (HOSPITAL_COMMUNITY)
Admission: EM | Admit: 2013-07-09 | Discharge: 2013-07-10 | Disposition: A | Discharge status: Home / Self Care | Payer: Medicare Other | Attending: Internal Medicine | Admitting: Internal Medicine

## 2013-07-09 ENCOUNTER — Telehealth (INDEPENDENT_AMBULATORY_CARE_PROVIDER_SITE_OTHER): Payer: Self-pay | Admitting: *Deleted

## 2013-07-09 ENCOUNTER — Encounter (HOSPITAL_COMMUNITY): Payer: Self-pay | Admitting: Pharmacy Technician

## 2013-07-09 DIAGNOSIS — K5900 Constipation, unspecified: Secondary | ICD-10-CM

## 2013-07-09 DIAGNOSIS — F3289 Other specified depressive episodes: Secondary | ICD-10-CM | POA: Diagnosis not present

## 2013-07-09 DIAGNOSIS — Q211 Atrial septal defect, unspecified: Secondary | ICD-10-CM

## 2013-07-09 DIAGNOSIS — K625 Hemorrhage of anus and rectum: Secondary | ICD-10-CM

## 2013-07-09 DIAGNOSIS — Z87891 Personal history of nicotine dependence: Secondary | ICD-10-CM | POA: Diagnosis not present

## 2013-07-09 DIAGNOSIS — E785 Hyperlipidemia, unspecified: Secondary | ICD-10-CM

## 2013-07-09 DIAGNOSIS — R140 Abdominal distension (gaseous): Secondary | ICD-10-CM

## 2013-07-09 DIAGNOSIS — G609 Hereditary and idiopathic neuropathy, unspecified: Secondary | ICD-10-CM | POA: Insufficient documentation

## 2013-07-09 DIAGNOSIS — I10 Essential (primary) hypertension: Secondary | ICD-10-CM | POA: Diagnosis not present

## 2013-07-09 DIAGNOSIS — T782XXA Anaphylactic shock, unspecified, initial encounter: Secondary | ICD-10-CM | POA: Diagnosis not present

## 2013-07-09 DIAGNOSIS — R55 Syncope and collapse: Secondary | ICD-10-CM | POA: Diagnosis not present

## 2013-07-09 DIAGNOSIS — E1140 Type 2 diabetes mellitus with diabetic neuropathy, unspecified: Secondary | ICD-10-CM | POA: Diagnosis present

## 2013-07-09 DIAGNOSIS — Z720 Tobacco use: Secondary | ICD-10-CM

## 2013-07-09 DIAGNOSIS — F411 Generalized anxiety disorder: Secondary | ICD-10-CM | POA: Diagnosis not present

## 2013-07-09 DIAGNOSIS — T783XXA Angioneurotic edema, initial encounter: Secondary | ICD-10-CM | POA: Diagnosis not present

## 2013-07-09 DIAGNOSIS — R109 Unspecified abdominal pain: Secondary | ICD-10-CM

## 2013-07-09 DIAGNOSIS — G8929 Other chronic pain: Secondary | ICD-10-CM | POA: Diagnosis not present

## 2013-07-09 DIAGNOSIS — I2510 Atherosclerotic heart disease of native coronary artery without angina pectoris: Secondary | ICD-10-CM | POA: Diagnosis not present

## 2013-07-09 DIAGNOSIS — K7689 Other specified diseases of liver: Secondary | ICD-10-CM | POA: Diagnosis not present

## 2013-07-09 DIAGNOSIS — Z1211 Encounter for screening for malignant neoplasm of colon: Secondary | ICD-10-CM

## 2013-07-09 DIAGNOSIS — Z7982 Long term (current) use of aspirin: Secondary | ICD-10-CM | POA: Diagnosis not present

## 2013-07-09 DIAGNOSIS — G8921 Chronic pain due to trauma: Secondary | ICD-10-CM

## 2013-07-09 DIAGNOSIS — X58XXXA Exposure to other specified factors, initial encounter: Secondary | ICD-10-CM | POA: Insufficient documentation

## 2013-07-09 DIAGNOSIS — E1165 Type 2 diabetes mellitus with hyperglycemia: Secondary | ICD-10-CM | POA: Diagnosis present

## 2013-07-09 DIAGNOSIS — R1310 Dysphagia, unspecified: Secondary | ICD-10-CM | POA: Diagnosis not present

## 2013-07-09 DIAGNOSIS — I200 Unstable angina: Secondary | ICD-10-CM

## 2013-07-09 DIAGNOSIS — K760 Fatty (change of) liver, not elsewhere classified: Secondary | ICD-10-CM | POA: Diagnosis present

## 2013-07-09 DIAGNOSIS — F329 Major depressive disorder, single episode, unspecified: Secondary | ICD-10-CM | POA: Insufficient documentation

## 2013-07-09 DIAGNOSIS — E119 Type 2 diabetes mellitus without complications: Secondary | ICD-10-CM | POA: Diagnosis not present

## 2013-07-09 DIAGNOSIS — R9431 Abnormal electrocardiogram [ECG] [EKG]: Secondary | ICD-10-CM

## 2013-07-09 DIAGNOSIS — Q2111 Secundum atrial septal defect: Secondary | ICD-10-CM | POA: Diagnosis not present

## 2013-07-09 DIAGNOSIS — R0602 Shortness of breath: Secondary | ICD-10-CM | POA: Diagnosis not present

## 2013-07-09 LAB — POCT I-STAT, CHEM 8
BUN: 18 mg/dL (ref 6–23)
Calcium, Ion: 1.2 mmol/L (ref 1.12–1.23)
Chloride: 99 mEq/L (ref 96–112)
Creatinine, Ser: 1.1 mg/dL (ref 0.50–1.10)
Glucose, Bld: 224 mg/dL — ABNORMAL HIGH (ref 70–99)
HEMATOCRIT: 50 % — AB (ref 36.0–46.0)
HEMOGLOBIN: 17 g/dL — AB (ref 12.0–15.0)
Potassium: 4.6 mEq/L (ref 3.7–5.3)
Sodium: 140 mEq/L (ref 137–147)
TCO2: 29 mmol/L (ref 0–100)

## 2013-07-09 MED ORDER — PEG 3350-KCL-NA BICARB-NACL 420 G PO SOLR: 4000.0000 mL | Freq: Once | ORAL | Status: DC

## 2013-07-09 MED ORDER — SODIUM CHLORIDE 0.9 % IJ SOLN
3.0000 mL | Freq: Two times a day (BID) | INTRAMUSCULAR | Status: DC
  Administered 2013-07-10: 3 mL via INTRAVENOUS

## 2013-07-09 MED ORDER — GABAPENTIN 400 MG PO CAPS
400.0000 mg | ORAL_CAPSULE | Freq: Four times a day (QID) | ORAL | Status: DC
  Administered 2013-07-09 – 2013-07-10 (×2): 400 mg via ORAL
  Filled 2013-07-09 (×2): qty 1

## 2013-07-09 MED ORDER — METHYLPREDNISOLONE SODIUM SUCC 125 MG IJ SOLR
125.0000 mg | Freq: Once | INTRAMUSCULAR | Status: AC
  Administered 2013-07-09: 125 mg via INTRAVENOUS
  Filled 2013-07-09: qty 2

## 2013-07-09 MED ORDER — SODIUM CHLORIDE 0.9 % IV SOLN
INTRAVENOUS | Status: AC
  Administered 2013-07-10: via INTRAVENOUS

## 2013-07-09 MED ORDER — DOCUSATE SODIUM 100 MG PO CAPS
100.0000 mg | ORAL_CAPSULE | Freq: Every day | ORAL | Status: DC
  Administered 2013-07-09: 100 mg via ORAL
  Filled 2013-07-09: qty 1

## 2013-07-09 MED ORDER — NITROGLYCERIN 0.4 MG SL SUBL: 0.4000 mg | SUBLINGUAL_TABLET | SUBLINGUAL | Status: DC | PRN

## 2013-07-09 MED ORDER — FAMOTIDINE IN NACL 20-0.9 MG/50ML-% IV SOLN
20.0000 mg | Freq: Once | INTRAVENOUS | Status: AC
  Administered 2013-07-09: 20 mg via INTRAVENOUS
  Filled 2013-07-09: qty 50

## 2013-07-09 MED ORDER — ASPIRIN EC 81 MG PO TBEC
81.0000 mg | DELAYED_RELEASE_TABLET | Freq: Every day | ORAL | Status: DC
  Administered 2013-07-10: 81 mg via ORAL
  Filled 2013-07-09: qty 1

## 2013-07-09 MED ORDER — METHYLPREDNISOLONE SODIUM SUCC 125 MG IJ SOLR
80.0000 mg | Freq: Two times a day (BID) | INTRAMUSCULAR | Status: DC
  Administered 2013-07-10: 80 mg via INTRAVENOUS
  Filled 2013-07-09: qty 2

## 2013-07-09 MED ORDER — ATORVASTATIN CALCIUM 20 MG PO TABS: 20.0000 mg | ORAL_TABLET | Freq: Every day | ORAL | Status: DC

## 2013-07-09 MED ORDER — OXYCODONE-ACETAMINOPHEN 5-325 MG PO TABS
2.0000 | ORAL_TABLET | Freq: Four times a day (QID) | ORAL | Status: DC | PRN
  Administered 2013-07-09 – 2013-07-10 (×2): 2 via ORAL
  Filled 2013-07-09 (×3): qty 2

## 2013-07-09 MED ORDER — HYDROCORTISONE ACETATE 25 MG RE SUPP
25.0000 mg | Freq: Every day | RECTAL | Status: DC
  Filled 2013-07-09 (×2): qty 1

## 2013-07-09 MED ORDER — OXYCODONE-ACETAMINOPHEN 5-325 MG PO TABS
1.0000 | ORAL_TABLET | Freq: Once | ORAL | Status: AC
  Administered 2013-07-09: 1 via ORAL
  Filled 2013-07-09: qty 1

## 2013-07-09 MED ORDER — METOPROLOL TARTRATE 50 MG PO TABS
100.0000 mg | ORAL_TABLET | Freq: Two times a day (BID) | ORAL | Status: DC
  Administered 2013-07-09 – 2013-07-10 (×2): 100 mg via ORAL
  Filled 2013-07-09 (×2): qty 2

## 2013-07-09 MED ORDER — ISOSORBIDE MONONITRATE ER 60 MG PO TB24: 60.0000 mg | ORAL_TABLET | Freq: Every evening | ORAL | Status: DC

## 2013-07-09 MED ORDER — DIPHENHYDRAMINE HCL 25 MG PO CAPS: 50.0000 mg | ORAL_CAPSULE | Freq: Four times a day (QID) | ORAL | Status: DC | PRN

## 2013-07-09 MED ORDER — ALPRAZOLAM 1 MG PO TABS
1.0000 mg | ORAL_TABLET | Freq: Four times a day (QID) | ORAL | Status: DC | PRN
  Administered 2013-07-09: 1 mg via ORAL
  Filled 2013-07-09: qty 1

## 2013-07-09 MED ORDER — EPINEPHRINE 0.3 MG/0.3ML IJ SOAJ
0.3000 mg | Freq: Once | INTRAMUSCULAR | Status: AC
  Administered 2013-07-09: 0.3 mg via INTRAMUSCULAR
  Filled 2013-07-09: qty 0.3

## 2013-07-09 MED ORDER — METHADONE HCL 10 MG PO TABS
5.0000 mg | ORAL_TABLET | Freq: Two times a day (BID) | ORAL | Status: DC
  Administered 2013-07-09 – 2013-07-10 (×2): 5 mg via ORAL
  Filled 2013-07-09 (×2): qty 1
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
## 2013-07-09 NOTE — ED Notes (Signed)
Patient arrives via EMS from [facility] with c/o allergic reaction to unknown factor. Patient given 0.3 mg epi IM and 25 mg benadryl IV given PTA. Patient with oral swelling. Able to handle secretions. Respirations even and unlabored. Alert/oriented x 4.

## 2013-07-09 NOTE — Telephone Encounter (Signed)
Patient needs trilyte 

## 2013-07-09 NOTE — ED Notes (Signed)
Patient states chest pain, midsternal for past three days intermittently. Denies radiation of pain, sharp in nature.

## 2013-07-09 NOTE — Telephone Encounter (Signed)
Patient has been posted for Colonoscopy this week.

## 2013-07-09 NOTE — ED Notes (Addendum)
Family at bedside. Patient states that she is feeling better than when she first arrived. States that her chest is not hurting at this time and she can breath better. Patient asking if she can take her own prescription medicine for headache. Let her nurse RN Vermille aware.
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
## 2013-07-10 ENCOUNTER — Encounter (HOSPITAL_COMMUNITY): Payer: Self-pay | Admitting: Internal Medicine

## 2013-07-10 DIAGNOSIS — T782XXA Anaphylactic shock, unspecified, initial encounter: Secondary | ICD-10-CM | POA: Diagnosis not present

## 2013-07-10 DIAGNOSIS — T783XXA Angioneurotic edema, initial encounter: Secondary | ICD-10-CM | POA: Diagnosis not present

## 2013-07-10 DIAGNOSIS — I10 Essential (primary) hypertension: Secondary | ICD-10-CM | POA: Diagnosis not present

## 2013-07-10 DIAGNOSIS — I2510 Atherosclerotic heart disease of native coronary artery without angina pectoris: Secondary | ICD-10-CM | POA: Diagnosis not present

## 2013-07-10 MED ORDER — INSULIN ASPART 100 UNIT/ML ~~LOC~~ SOLN: 0.0000 [IU] | Freq: Three times a day (TID) | SUBCUTANEOUS | Status: DC

## 2013-07-10 MED ORDER — FAMOTIDINE 10 MG PO TABS: 20.0000 mg | ORAL_TABLET | Freq: Every day | ORAL | Status: DC

## 2013-07-10 MED ORDER — EPINEPHRINE 0.3 MG/0.3ML IJ SOAJ: 0.3000 mg | Freq: Once | INTRAMUSCULAR | Status: DC

## 2013-07-10 MED ORDER — PREDNISONE 20 MG PO TABS: ORAL_TABLET | ORAL | Status: DC

## 2013-07-10 MED ORDER — DIPHENHYDRAMINE HCL 25 MG PO CAPS: 25.0000 mg | ORAL_CAPSULE | Freq: Two times a day (BID) | ORAL | Status: DC

## 2013-07-10 MED ORDER — INSULIN ASPART 100 UNIT/ML ~~LOC~~ SOLN: 0.0000 [IU] | Freq: Every day | SUBCUTANEOUS | Status: DC

## 2013-07-10 MED ORDER — HYDROCORTISONE ACETATE 25 MG RE SUPP
RECTAL | Status: AC
  Filled 2013-07-10: qty 1

## 2013-07-10 NOTE — Progress Notes (Signed)
Pt discharged home today per Dr. Caryn Section. Pt's IV site D/C'd and WNL. Pt's VSS. Pt provided with home medication list, discharge instructions, prescriptions, list of ACE-I, and made aware of follow up appointments in place. Verbalized understanding. Pt left floor via WC in stable condition accompanied by NT.

## 2013-07-10 NOTE — Discharge Summary (Signed)
Physician Discharge Summary  Anita [name] [id number] DOB: [birth year] DOA: 07/09/2013  PCP: Glo Herring., MD  Admit date: 07/09/2013 Discharge date: 07/10/2013  Time spent: Greater than 30 minutes  Recommendations for Outpatient Follow-up:  1. The patient will need a followup of her blood pressure off of ramipril.  Discharge Diagnoses:  1. Acute anaphylaxis and angioedema, etiology unclear, but ACE inhibitor therapy discontinued. 2. Syncope, secondary to anaphylaxis. 3. Atrial septal defect repair in 1988. 4. Coronary artery disease, 30% LAD 01/2011. 5. Hypertension. 6. Nonalcoholic fatty liver disease. 6. Diabetes mellitus, type II.  Discharge Condition: Improved.  Diet recommendation: Heart healthy/carbohydrate modified.  Filed Weights   07/09/13 1806 07/09/13 2213  Weight: 74.844 kg (165 lb) 72.4 kg (159 lb 9.8 oz)    History of present illness:   The patient is a 53 yo female with a h/o chronic pain, CAD, dm,and htn. She was eating out at the [place] her son's 21st birthday. She ate a cheezy bread, some mashed potatoes, bourbon street chicken, and a salad without nuts with thousand island dressing. She always eats at the [facility]. During the meal, she suddenly got flushed and felt something wrong with her throat. She then started feeling something weird happening to her tongue and mouth. She started becoming sob and felt like she couldn't breathe. Her son reported to her afterwards that she also had diffuse hives all over her body. 911 was immediately called. She passed out. Upon arrival of EMS, they gave her an epi shot and steroids and she improved. She was brought to the ED. On arrival to the ED, she was responsive, but there was still some flushing to her face but no hives. Her mouth and tongue were still swollen. She was given a second round of epinepherine, along with benadryl and pepcid. Her symptoms continued to improve with all swelling resolved.  This has never happened to patient before. She had recently started 2 new meds from her GI doctor but she does not know what they are; they were started 2 months ago. She is on an ace inhibitor. No new otc meds or herbal medicines.   Hospital Course:  The patient was continued on Solu-Medrol, Pepcid, and Benadryl. She was treated supportively with IV fluids and analgesics as needed.. Ramipril was discontinued. Most of not all of her other medications were continued. She remained afebrile and hemodynamically stable. Her symptoms completely resolved. There were no focal neurological deficits. There was no residual urticaria or angioedema. Etiology of her anaphylaxis and/or angioedema was considered to be secondary to an allergic reaction to a food or food substance or a manifestation of ACE inhibitor-induced angioedema. For this reason, she was instructed not to take ramipril or any other medicine in the class of ACE inhibitors every again. She voiced understanding. She was continued on metoprolol for treatment of her blood pressure. Her blood pressure remained well controlled during the hospitalization, however, she was instructed to followup with her primary care physician or cardiologist to reassess her blood pressure and for further medication management. She was discharged on several more days of prednisone, Benadryl, and Pepcid. She was given a prescription for an EpiPen.  Procedures:  None  Consultations:  None  Discharge Exam: Filed Vitals:   07/10/13 0518  BP: 132/73  Pulse: 57  Temp: 97.6 F (36.4 C)  Resp: 20    General: Pleasant alert 53 year old woman in no acute distress. Oral pharynx. Mildly dry mucous membranes, no posterior exudates or  erythema. No lip or tongue edema or erythema. Cardiovascular: S1, S2, with a soft systolic murmur. Respiratory: Clear to auscultation bilaterally.  Discharge Instructions  Discharge Orders   Future Orders Complete By Expires   Diet - low  sodium heart healthy  As directed    Discharge instructions  As directed    Comments:     STOP RAMIPRIL AND DO NOT EVER TAKE IT AGAIN. DO NOT TAKE ACE INHIBITOR BLOOD PRESSURE MEDICATIONS AGAIN. Discuss further blood pressure management with your cardiologist or primary care doctor.   Increase activity slowly  As directed        Medication List    STOP taking these medications       ramipril 10 MG capsule  Commonly known as:  ALTACE      TAKE these medications       ALPHA LIPOIC ACID PO  Take 1 capsule by mouth at bedtime.     ALPRAZolam 1 MG tablet  Commonly known as:  XANAX  Take 1 mg by mouth 4 (four) times daily as needed for anxiety.     amLODipine 10 MG tablet  Commonly known as:  NORVASC  TAKE 1 TABLET BY MOUTH DAILY.     aspirin EC 81 MG tablet  Take 81 mg by mouth daily.     atorvastatin 20 MG tablet  Commonly known as:  LIPITOR  Take 1 tablet (20 mg total) by mouth daily at 6 PM.     diphenhydrAMINE 25 mg capsule  Commonly known as:  BENADRYL  Take 1 capsule (25 mg total) by mouth 2 (two) times daily. Take for 3 more days for treatment of allergic reaction.     docusate sodium 100 MG capsule  Commonly known as:  COLACE  Take 100 mg by mouth at bedtime.     EPINEPHrine 0.3 mg/0.3 mL Soaj injection  Commonly known as:  EPIPEN  Inject 0.3 mLs (0.3 mg total) into the muscle once. Use as directed for allergic reactions or swelling secondary to allergic reactions.     famotidine 10 MG tablet  Commonly known as:  PEPCID AC  Take 2 tablets (20 mg total) by mouth daily. Take 2 tablets daily for 3 more days for treatment of the allergic reaction.     gabapentin 400 MG capsule  Commonly known as:  NEURONTIN  Take 400 mg by mouth 4 (four) times daily.     glyBURIDE-metformin 5-500 MG per tablet  Commonly known as:  GLUCOVANCE  Take 1 tablet by mouth 3 (three) times daily.     HEALTHY COLON PO  Take 1 capsule by mouth at bedtime.     hydrocortisone 25 MG  suppository  Commonly known as:  ANUSOL-HC  Place 1 suppository (25 mg total) rectally at bedtime.     isosorbide mononitrate 60 MG 24 hr tablet  Commonly known as:  IMDUR  Take 60 mg by mouth every evening.     methadone 5 MG tablet  Commonly known as:  DOLOPHINE  Take 5 mg by mouth 2 (two) times daily.     metoprolol 100 MG tablet  Commonly known as:  LOPRESSOR  Take 100 mg by mouth 2 (two) times daily.     nitroGLYCERIN 0.4 MG SL tablet  Commonly known as:  NITROSTAT  Place 0.4 mg under the tongue every 5 (five) minutes as needed for chest pain.     oxyCODONE-acetaminophen 10-325 MG per tablet  Commonly known as:  PERCOCET  Take 1 tablet  by mouth every 6 (six) hours as needed for pain.     polyethylene glycol-electrolytes 420 G solution  Commonly known as:  NuLYTELY/GoLYTELY  Take 4,000 mLs by mouth once.     predniSONE 20 MG tablet  Commonly known as:  DELTASONE  Take 1 tablet for 3 more days for treatment of the allergic reaction.       Allergies  Allergen Reactions  . Ramipril     Possible ACE-I induced angioedema   . Imitrex [Sumatriptan] Swelling    States she had swelling in neck  . Lyrica [Pregabalin] Other (See Comments)    Severe Leg and feet swelling  . Penicillins Rash       Follow-up Information   Follow up with KELLY,THOMAS A, MD. Schedule an appointment as soon as possible for a visit in 1 week.   Specialty:  Cardiology   Contact information:   [address] [place] [address] [phone]       Follow up with Glo Herring., MD In 2 weeks.   Specialty:  Internal Medicine   Contact information:   [address] [place] [address] [phone]        The results of significant diagnostics from this hospitalization (including imaging, microbiology, ancillary and laboratory) are listed below for reference.    Significant Diagnostic Studies: Ct Abdomen Pelvis W Contrast  06/19/2013   CLINICAL  DATA:  Two week history of abdominal pain  EXAM: CT ABDOMEN AND PELVIS WITH CONTRAST  TECHNIQUE: Multidetector CT imaging of the abdomen and pelvis was performed using the standard protocol following bolus administration of intravenous contrast.  CONTRAST:  133mL OMNIPAQUE IOHEXOL 300 MG/ML SOLN, 34mL OMNIPAQUE IOHEXOL 300 MG/ML SOLN  COMPARISON:  01/21/2013  FINDINGS: Minimal dependent basilar atelectasis. Respiratory motion artifact present. No lower lobe pneumonia. No pericardial or pleural effusion. Normal heart size.  Abdomen: Liver, gallbladder, biliary system, pancreas, spleen, adrenal glands, and kidneys are within normal limits for age and demonstrate no acute process. Slight fatty atrophy noted of the pancreatic head as before. Stable small 10 mm cortical cyst in the right kidney lower pole, image 46. Stable punctate nonobstructing 3 mm left lower pole renal calculus, image 44.  Abdominal atherosclerosis present without aneurysm or acute vascular process.  Negative for bowel obstruction, dilatation, ileus, or free air.  No abdominal free fluid, fluid collection, hemorrhage, abscess, or adenopathy.  Moderate retained stool throughout the colon compatible with constipation. Normal appendix demonstrated.  Pelvis: Moderate distention of the urinary bladder. Distal colon is also stool-filled. No pelvic free fluid, fluid collection, hemorrhage, abscess, adenopathy, inguinal abnormality, or hernia. Prior hysterectomy noted.  Minor degenerative changes of the spine.  IMPRESSION: No acute intra-abdominal or pelvic finding by CT.  Punctate nonobstructing left lower pole renal calculus.  Retained stool throughout the colon can be seen with constipation  Normal appendix  Prior hysterectomy   Electronically Signed   By: Daryll Brod M.D.   On: 06/19/2013 15:48    Microbiology: No results found for this or any previous visit (from the past 240 hour(s)).   Labs: Basic Metabolic Panel:  Recent Labs Lab  07/09/13 1945  NA 140  K 4.6  CL 99  GLUCOSE 224*  BUN 18  CREATININE 1.10   Liver Function Tests: No results found for this basename: AST, ALT, ALKPHOS, BILITOT, PROT, ALBUMIN,  in the last 168 hours No results found for this basename: LIPASE, AMYLASE,  in the last 168 hours No  results found for this basename: AMMONIA,  in the last 168 hours CBC:  Recent Labs Lab 07/09/13 1945  HGB 17.0*  HCT 50.0*   Cardiac Enzymes: No results found for this basename: CKTOTAL, CKMB, CKMBINDEX, TROPONINI,  in the last 168 hours BNP: BNP (last 3 results) No results found for this basename: PROBNP,  in the last 8760 hours CBG: No results found for this basename: GLUCAP,  in the last 168 hours     Signed:  Deontre Allsup  [facility] 07/10/2013, 10:29 AM

## 2013-07-11 ENCOUNTER — Encounter (HOSPITAL_COMMUNITY): Admission: RE | Admit: 2013-07-11 | Payer: Medicare Other | Source: Ambulatory Visit

## 2013-07-12 ENCOUNTER — Encounter (HOSPITAL_COMMUNITY): Admission: RE | Admit: 2013-07-12 | Payer: Medicare Other | Source: Ambulatory Visit

## 2013-07-12 DIAGNOSIS — T782XXA Anaphylactic shock, unspecified, initial encounter: Secondary | ICD-10-CM | POA: Diagnosis not present

## 2013-07-12 DIAGNOSIS — Z6830 Body mass index (BMI) 30.0-30.9, adult: Secondary | ICD-10-CM | POA: Diagnosis not present

## 2013-07-12 DIAGNOSIS — I10 Essential (primary) hypertension: Secondary | ICD-10-CM | POA: Diagnosis not present

## 2013-07-27 ENCOUNTER — Ambulatory Visit: Payer: Medicare Other | Admitting: Cardiovascular Disease

## 2013-07-31 ENCOUNTER — Encounter: Payer: Self-pay | Admitting: Cardiovascular Disease

## 2013-08-06 ENCOUNTER — Encounter (HOSPITAL_COMMUNITY)
Admission: RE | Admit: 2013-08-06 | Discharge: 2013-08-06 | Disposition: A | Discharge status: Home / Self Care | Payer: Medicare Other | Source: Ambulatory Visit | Attending: Internal Medicine | Admitting: Internal Medicine

## 2013-08-08 ENCOUNTER — Encounter (HOSPITAL_COMMUNITY): Payer: Self-pay

## 2013-08-10 NOTE — OR Nursing (Signed)
Anaphylaxis  reaction episode  Reported to Dr. Patsey Berthold.

## 2013-08-13 ENCOUNTER — Ambulatory Visit (HOSPITAL_COMMUNITY): Payer: Medicare Other | Admitting: Anesthesiology

## 2013-08-13 ENCOUNTER — Encounter (HOSPITAL_COMMUNITY): Payer: Medicare Other | Admitting: Anesthesiology

## 2013-08-13 ENCOUNTER — Encounter (HOSPITAL_COMMUNITY): Payer: Self-pay | Admitting: *Deleted

## 2013-08-13 ENCOUNTER — Ambulatory Visit (HOSPITAL_COMMUNITY)
Admission: RE | Admit: 2013-08-13 | Discharge: 2013-08-13 | Disposition: A | Discharge status: Home / Self Care | Payer: Medicare Other | Source: Ambulatory Visit | Attending: Internal Medicine | Admitting: Internal Medicine

## 2013-08-13 ENCOUNTER — Encounter (HOSPITAL_COMMUNITY)
Admission: RE | Disposition: A | Discharge status: Home / Self Care | Payer: Self-pay | Source: Ambulatory Visit | Attending: Internal Medicine

## 2013-08-13 DIAGNOSIS — E785 Hyperlipidemia, unspecified: Secondary | ICD-10-CM | POA: Diagnosis not present

## 2013-08-13 DIAGNOSIS — I4510 Unspecified right bundle-branch block: Secondary | ICD-10-CM | POA: Diagnosis not present

## 2013-08-13 DIAGNOSIS — N83209 Unspecified ovarian cyst, unspecified side: Secondary | ICD-10-CM | POA: Insufficient documentation

## 2013-08-13 DIAGNOSIS — Z87891 Personal history of nicotine dependence: Secondary | ICD-10-CM | POA: Insufficient documentation

## 2013-08-13 DIAGNOSIS — G8929 Other chronic pain: Secondary | ICD-10-CM | POA: Insufficient documentation

## 2013-08-13 DIAGNOSIS — G43909 Migraine, unspecified, not intractable, without status migrainosus: Secondary | ICD-10-CM | POA: Insufficient documentation

## 2013-08-13 DIAGNOSIS — R109 Unspecified abdominal pain: Secondary | ICD-10-CM | POA: Insufficient documentation

## 2013-08-13 DIAGNOSIS — F411 Generalized anxiety disorder: Secondary | ICD-10-CM | POA: Diagnosis not present

## 2013-08-13 DIAGNOSIS — K921 Melena: Secondary | ICD-10-CM | POA: Insufficient documentation

## 2013-08-13 DIAGNOSIS — K5900 Constipation, unspecified: Secondary | ICD-10-CM | POA: Insufficient documentation

## 2013-08-13 DIAGNOSIS — M542 Cervicalgia: Secondary | ICD-10-CM | POA: Insufficient documentation

## 2013-08-13 DIAGNOSIS — E119 Type 2 diabetes mellitus without complications: Secondary | ICD-10-CM | POA: Insufficient documentation

## 2013-08-13 DIAGNOSIS — K648 Other hemorrhoids: Secondary | ICD-10-CM | POA: Diagnosis not present

## 2013-08-13 DIAGNOSIS — K625 Hemorrhage of anus and rectum: Secondary | ICD-10-CM

## 2013-08-13 DIAGNOSIS — K7689 Other specified diseases of liver: Secondary | ICD-10-CM | POA: Insufficient documentation

## 2013-08-13 DIAGNOSIS — Z7982 Long term (current) use of aspirin: Secondary | ICD-10-CM | POA: Diagnosis not present

## 2013-08-13 DIAGNOSIS — G609 Hereditary and idiopathic neuropathy, unspecified: Secondary | ICD-10-CM | POA: Diagnosis not present

## 2013-08-13 DIAGNOSIS — K644 Residual hemorrhoidal skin tags: Secondary | ICD-10-CM | POA: Diagnosis not present

## 2013-08-13 DIAGNOSIS — F329 Major depressive disorder, single episode, unspecified: Secondary | ICD-10-CM | POA: Diagnosis not present

## 2013-08-13 DIAGNOSIS — F3289 Other specified depressive episodes: Secondary | ICD-10-CM | POA: Insufficient documentation

## 2013-08-13 DIAGNOSIS — I10 Essential (primary) hypertension: Secondary | ICD-10-CM | POA: Diagnosis not present

## 2013-08-13 DIAGNOSIS — IMO0002 Reserved for concepts with insufficient information to code with codable children: Secondary | ICD-10-CM | POA: Insufficient documentation

## 2013-08-13 DIAGNOSIS — D649 Anemia, unspecified: Secondary | ICD-10-CM | POA: Insufficient documentation

## 2013-08-13 HISTORY — PX: COLONOSCOPY WITH PROPOFOL: SHX5780

## 2013-08-13 SURGERY — COLONOSCOPY WITH PROPOFOL
Anesthesia: Monitor Anesthesia Care

## 2013-08-13 MED ORDER — PROPOFOL 10 MG/ML IV EMUL
INTRAVENOUS | Status: AC
  Filled 2013-08-13: qty 20

## 2013-08-13 MED ORDER — GLYCOPYRROLATE 0.2 MG/ML IJ SOLN
INTRAMUSCULAR | Status: AC
  Filled 2013-08-13: qty 1

## 2013-08-13 MED ORDER — ONDANSETRON HCL 4 MG/2ML IJ SOLN: 4.0000 mg | Freq: Once | INTRAMUSCULAR | Status: DC | PRN

## 2013-08-13 MED ORDER — PROPOFOL INFUSION 10 MG/ML OPTIME
INTRAVENOUS | Status: DC | PRN
  Administered 2013-08-13: 75 ug/kg/min via INTRAVENOUS

## 2013-08-13 MED ORDER — FENTANYL CITRATE 0.05 MG/ML IJ SOLN: 25.0000 ug | INTRAMUSCULAR | Status: DC | PRN

## 2013-08-13 MED ORDER — LACTATED RINGERS IV SOLN: INTRAVENOUS | Status: DC

## 2013-08-13 MED ORDER — ONDANSETRON HCL 4 MG/2ML IJ SOLN
4.0000 mg | Freq: Once | INTRAMUSCULAR | Status: AC
  Administered 2013-08-13: 4 mg via INTRAVENOUS

## 2013-08-13 MED ORDER — HYDROCORTISONE ACETATE 25 MG RE SUPP: 25.0000 mg | Freq: Every day | RECTAL | Status: DC

## 2013-08-13 MED ORDER — MIDAZOLAM HCL 2 MG/2ML IJ SOLN
INTRAMUSCULAR | Status: AC
  Filled 2013-08-13: qty 2

## 2013-08-13 MED ORDER — FENTANYL CITRATE 0.05 MG/ML IJ SOLN
INTRAMUSCULAR | Status: DC | PRN
  Administered 2013-08-13 (×2): 25 ug via INTRAVENOUS
  Administered 2013-08-13: 50 ug via INTRAVENOUS

## 2013-08-13 MED ORDER — FENTANYL CITRATE 0.05 MG/ML IJ SOLN
INTRAMUSCULAR | Status: AC
  Filled 2013-08-13: qty 2

## 2013-08-13 MED ORDER — MIDAZOLAM HCL 5 MG/5ML IJ SOLN
INTRAMUSCULAR | Status: DC | PRN
  Administered 2013-08-13: 2 mg via INTRAVENOUS

## 2013-08-13 MED ORDER — LIDOCAINE HCL (CARDIAC) 10 MG/ML IV SOLN
INTRAVENOUS | Status: DC | PRN
  Administered 2013-08-13: 50 mg via INTRAVENOUS

## 2013-08-13 MED ORDER — LACTATED RINGERS IV SOLN
INTRAVENOUS | Status: DC | PRN
  Administered 2013-08-13: 07:00:00 via INTRAVENOUS
  Administered 2013-08-13: 1000 mL

## 2013-08-13 MED ORDER — GLYCOPYRROLATE 0.2 MG/ML IJ SOLN
0.2000 mg | Freq: Once | INTRAMUSCULAR | Status: AC
  Administered 2013-08-13: 0.2 mg via INTRAVENOUS

## 2013-08-13 MED ORDER — SUCCINYLCHOLINE CHLORIDE 20 MG/ML IJ SOLN
INTRAMUSCULAR | Status: AC
  Filled 2013-08-13: qty 1

## 2013-08-13 MED ORDER — PSYLLIUM 28 % PO PACK: 1.0000 | PACK | Freq: Every day | ORAL | Status: DC

## 2013-08-13 MED ORDER — MIDAZOLAM HCL 2 MG/2ML IJ SOLN
1.0000 mg | INTRAMUSCULAR | Status: DC | PRN
  Administered 2013-08-13: 2 mg via INTRAVENOUS

## 2013-08-13 MED ORDER — ONDANSETRON HCL 4 MG/2ML IJ SOLN
INTRAMUSCULAR | Status: AC
  Filled 2013-08-13: qty 2

## 2013-08-13 MED ORDER — LIDOCAINE HCL (PF) 1 % IJ SOLN
INTRAMUSCULAR | Status: AC
  Filled 2013-08-13: qty 5

## 2013-08-13 MED ORDER — FENTANYL CITRATE 0.05 MG/ML IJ SOLN
25.0000 ug | INTRAMUSCULAR | Status: AC
  Administered 2013-08-13: 25 ug via INTRAVENOUS

## 2013-08-13 MED ORDER — STERILE WATER FOR IRRIGATION IR SOLN
Status: DC | PRN
  Administered 2013-08-13: 07:00:00

## 2013-08-13 SURGICAL SUPPLY — 25 items
ELECT REM PT RETURN 9FT ADLT (ELECTROSURGICAL)
ELECTRODE REM PT RTRN 9FT ADLT (ELECTROSURGICAL) IMPLANT
FCP BXJMBJMB 240X2.8X (CUTTING FORCEPS)
FLOOR PAD 36X40 (MISCELLANEOUS) ×3
FORCEPS BIOP RAD 4 LRG CAP 4 (CUTTING FORCEPS) IMPLANT
FORCEPS BIOP RJ4 240 W/NDL (CUTTING FORCEPS)
FORCEPS BXJMBJMB 240X2.8X (CUTTING FORCEPS) IMPLANT
FORMALIN 10 PREFIL 20ML (MISCELLANEOUS) IMPLANT
INJECTOR/SNARE I SNARE (MISCELLANEOUS) IMPLANT
KIT CLEAN ENDO COMPLIANCE (KITS) ×3 IMPLANT
LUBRICANT JELLY 4.5OZ STERILE (MISCELLANEOUS) ×2 IMPLANT
MANIFOLD NEPTUNE II (INSTRUMENTS) ×2 IMPLANT
NDL SCLEROTHERAPY 25GX240 (NEEDLE) IMPLANT
NEEDLE SCLEROTHERAPY 25GX240 (NEEDLE) IMPLANT
PAD FLOOR 36X40 (MISCELLANEOUS) IMPLANT
PROBE APC STR FIRE (PROBE) IMPLANT
PROBE INJECTION GOLD (MISCELLANEOUS)
PROBE INJECTION GOLD 7FR (MISCELLANEOUS) IMPLANT
SNARE ROTATE MED OVAL 20MM (MISCELLANEOUS) IMPLANT
SNARE SHORT THROW 13M SML OVAL (MISCELLANEOUS) ×1 IMPLANT
SYR 50ML LL SCALE MARK (SYRINGE) ×2 IMPLANT
TRAP SPECIMEN MUCOUS 40CC (MISCELLANEOUS) IMPLANT
TUBING ENDO SMARTCAP PENTAX (MISCELLANEOUS) IMPLANT
TUBING IRRIGATION ENDOGATOR (MISCELLANEOUS) ×4 IMPLANT
WATER STERILE IRR 1000ML POUR (IV SOLUTION) ×2 IMPLANT

## 2013-08-13 NOTE — Discharge Instructions (Signed)
Resume usual medications and high fiber diet. Continue Anusol-HC suppository 1 per rectum daily at bedtime for 2 weeks. Metamucil 4 g or one packet by mouth daily at bedtime. No driving for 24 hours.  Colonoscopy, Care After Refer to this sheet in the next few weeks. These instructions provide you with information on caring for yourself after your procedure. Your health care provider may also give you more specific instructions. Your treatment has been planned according to current medical practices, but problems sometimes occur. Call your health care provider if you have any problems or questions after your procedure. WHAT TO EXPECT AFTER THE PROCEDURE  After your procedure, it is typical to have the following:  A small amount of blood in your stool.  Moderate amounts of gas and mild abdominal cramping or bloating. HOME CARE INSTRUCTIONS  Do not drive, operate machinery, or sign important documents for 24 hours.  You may shower and resume your regular physical activities, but move at a slower pace for the first 24 hours.  Take frequent rest periods for the first 24 hours.  Walk around or put a warm pack on your abdomen to help reduce abdominal cramping and bloating.  Drink enough fluids to keep your urine clear or pale yellow.  You may resume your normal diet as instructed by your health care provider. Avoid heavy or fried foods that are hard to digest.  Avoid drinking alcohol for 24 hours or as instructed by your health care provider.  Only take over-the-counter or prescription medicines as directed by your health care provider.  If a tissue sample (biopsy) was taken during your procedure:  Do not take aspirin or blood thinners for 7 days, or as instructed by your health care provider.  Do not drink alcohol for 7 days, or as instructed by your health care provider.  Eat soft foods for the first 24 hours. SEEK MEDICAL CARE IF: You have persistent spotting of blood in your stool  2 3 days after the procedure. SEEK IMMEDIATE MEDICAL CARE IF:  You have more than a small spotting of blood in your stool.  You pass large blood clots in your stool.  Your abdomen is swollen (distended).  You have nausea or vomiting.  You have a fever.  You have increasing abdominal pain that is not relieved with medicine.  High-Fiber Diet Fiber is found in fruits, vegetables, and grains. A high-fiber diet encourages the addition of more whole grains, legumes, fruits, and vegetables in your diet. The recommended amount of fiber for adult males is 38 g per day. For adult females, it is 25 g per day. Pregnant and lactating women should get 28 g of fiber per day. If you have a digestive or bowel problem, ask your caregiver for advice before adding high-fiber foods to your diet. Eat a variety of high-fiber foods instead of only a select few type of foods.  PURPOSE  To increase stool bulk.  To make bowel movements more regular to prevent constipation.  To lower cholesterol.  To prevent overeating. WHEN IS THIS DIET USED?  It may be used if you have constipation and hemorrhoids.  It may be used if you have uncomplicated diverticulosis (intestine condition) and irritable bowel syndrome.  It may be used if you need help with weight management.  It may be used if you want to add it to your diet as a protective measure against atherosclerosis, diabetes, and cancer. SOURCES OF FIBER  Whole-grain breads and cereals.  Fruits, such as  apples, oranges, bananas, berries, prunes, and pears.  Vegetables, such as green peas, carrots, sweet potatoes, beets, broccoli, cabbage, spinach, and artichokes.  Legumes, such split peas, soy, lentils.  Almonds. FIBER CONTENT IN FOODS Starches and Grains / Dietary Fiber (g)  Cheerios, 1 cup / 3 g  Corn Flakes cereal, 1 cup / 0.7 g  Rice crispy treat cereal, 1 cup / 0.3 g  Instant oatmeal (cooked),  cup / 2 g  Frosted wheat cereal, 1 cup /  5.1 g  Brown, long-grain rice (cooked), 1 cup / 3.5 g  White, long-grain rice (cooked), 1 cup / 0.6 g  Enriched macaroni (cooked), 1 cup / 2.5 g Legumes / Dietary Fiber (g)  Baked beans (canned, plain, or vegetarian),  cup / 5.2 g  Kidney beans (canned),  cup / 6.8 g  Pinto beans (cooked),  cup / 5.5 g Breads and Crackers / Dietary Fiber (g)  Plain or honey graham crackers, 2 squares / 0.7 g  Saltine crackers, 3 squares / 0.3 g  Plain, salted pretzels, 10 pieces / 1.8 g  Whole-wheat bread, 1 slice / 1.9 g  White bread, 1 slice / 0.7 g  Raisin bread, 1 slice / 1.2 g  Plain bagel, 3 oz / 2 g  Flour tortilla, 1 oz / 0.9 g  Corn tortilla, 1 small / 1.5 g  Hamburger or hotdog bun, 1 small / 0.9 g Fruits / Dietary Fiber (g)  Apple with skin, 1 medium / 4.4 g  Sweetened applesauce,  cup / 1.5 g  Banana,  medium / 1.5 g  Grapes, 10 grapes / 0.4 g  Orange, 1 small / 2.3 g  Raisin, 1.5 oz / 1.6 g  Melon, 1 cup / 1.4 g Vegetables / Dietary Fiber (g)  Green beans (canned),  cup / 1.3 g  Carrots (cooked),  cup / 2.3 g  Broccoli (cooked),  cup / 2.8 g  Peas (cooked),  cup / 4.4 g  Mashed potatoes,  cup / 1.6 g  Lettuce, 1 cup / 0.5 g  Corn (canned),  cup / 1.6 g  Tomato,  cup / 1.1 g Document Released: 05/17/2005 Document Revised: 11/16/2011 Document Reviewed: 08/19/2011 [facility] Patient Information 2014 [place], [place].
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
## 2013-08-13 NOTE — Anesthesia Postprocedure Evaluation (Signed)
  Anesthesia Post-op Note  Patient: Anita [name]  Procedure(s) Performed: Procedure(s) with comments: COLONOSCOPY WITH PROPOFOL (N/A) - in cecum at 0756 ; total withdrawal time 15 minutes  Patient Location: PACU  Anesthesia Type:MAC  Level of Consciousness: awake, alert , oriented and patient cooperative  Airway and Oxygen Therapy: Patient Spontanous Breathing and Patient connected to nasal cannula oxygen  Post-op Pain: mild  Post-op Assessment: Post-op Vital signs reviewed, Patient's Cardiovascular Status Stable, Respiratory Function Stable, Patent Airway, No signs of Nausea or vomiting and Pain level controlled  Post-op Vital Signs: Reviewed and stable  Complications: No apparent anesthesia complications

## 2013-08-13 NOTE — Anesthesia Procedure Notes (Signed)
Procedure Name: MAC Date/Time: 08/13/2013 7:31 AM Performed by: Andree Elk, AMY A Pre-anesthesia Checklist: Patient identified, Timeout performed, Emergency Drugs available, Suction available and Patient being monitored Oxygen Delivery Method: Non-rebreather mask
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
## 2013-08-13 NOTE — Preoperative (Signed)
Beta Blockers   Reason not to administer Beta Blockers:Not Applicable 

## 2013-08-13 NOTE — Transfer of Care (Signed)
Immediate Anesthesia Transfer of Care Note  Patient: Anita [name]  Procedure(s) Performed: Procedure(s) with comments: COLONOSCOPY WITH PROPOFOL (N/A) - in cecum at 0756 ; total withdrawal time 15 minutes  Patient Location: PACU  Anesthesia Type:MAC  Level of Consciousness: awake, alert , oriented and patient cooperative  Airway & Oxygen Therapy: Patient Spontanous Breathing and Patient connected to nasal cannula oxygen  Post-op Assessment: Report given to PACU RN and Post -op Vital signs reviewed and stable  Post vital signs: Reviewed and stable  Complications: No apparent anesthesia complications

## 2013-08-13 NOTE — Op Note (Signed)
COLONOSCOPY PROCEDURE REPORT  PATIENT:  Anita [name]  MR#:  [id number] Birthdate:  02-Jul-1960, 53 y.o., female Endoscopist:  Dr. Rogene Houston, MD Referred By:  Dr. Sherrilee Gilles. Gerarda Fraction, MD Procedure Date: 08/13/2013  Procedure:   Colonoscopy  Indications:  Patient is 53 year old Caucasian female who presents with constipation and lower abdominal pain and intermittent hematochezia. She is undergoing diagnostic colonoscopy. 2 previous attempts failed because of poor prep.  Informed Consent:  The procedure and risks were reviewed with the patient and informed consent was obtained.  Medications:  Monitored anesthesia care. Please see anesthesia records for details.  Description of procedure:  After a digital rectal exam was performed, that colonoscope was advanced from the anus through the rectum and colon to the area of the cecum, ileocecal valve and appendiceal orifice. The cecum was deeply intubated. These structures were well-seen and photographed for the record. From the level of the cecum and ileocecal valve, the scope was slowly and cautiously withdrawn. The mucosal surfaces were carefully surveyed utilizing scope tip to flexion to facilitate fold flattening as needed. The scope was pulled down into the rectum where a thorough exam including retroflexion was performed.  Findings:   Prep satisfactory. Normal mucosa of the cecum, ascending colon, hepatic flexure, transverse colon, splenic flexure, descending and sigmoid colon and rectum. Hemorrhoids noted above and below the dentate line with single erosion over hemorrhoids above the dentate line.   Therapeutic/Diagnostic Maneuvers Performed:  See above  Complications:  None  Cecal Withdrawal Time:  15 minutes  Impression:  Examination performed to cecum. Internal/external hemorrhoids with inflammation.  Recommendations:  Standard instructions given. High fiber diet. Metamucil 4 g by mouth each bedtime. Anusol-HC suppository 1  per rectum daily at bedtime for 2 weeks.   REHMAN,NAJEEB U  08/13/2013 8:16 AM  CC: Dr. Glo Herring., MD & Dr. Rayne Du ref. provider found

## 2013-08-15 ENCOUNTER — Encounter (HOSPITAL_COMMUNITY): Payer: Self-pay | Admitting: Internal Medicine

## 2013-08-15 LAB — GLUCOSE, CAPILLARY: Glucose-Capillary: 114 mg/dL — ABNORMAL HIGH (ref 70–99)

## 2013-08-27 DIAGNOSIS — M531 Cervicobrachial syndrome: Secondary | ICD-10-CM | POA: Diagnosis not present

## 2013-08-27 DIAGNOSIS — Z79899 Other long term (current) drug therapy: Secondary | ICD-10-CM | POA: Diagnosis not present

## 2013-08-27 DIAGNOSIS — M47812 Spondylosis without myelopathy or radiculopathy, cervical region: Secondary | ICD-10-CM | POA: Diagnosis not present

## 2013-08-27 DIAGNOSIS — G894 Chronic pain syndrome: Secondary | ICD-10-CM | POA: Diagnosis not present

## 2013-09-06 ENCOUNTER — Ambulatory Visit (INDEPENDENT_AMBULATORY_CARE_PROVIDER_SITE_OTHER): Payer: Medicare Other | Admitting: Cardiovascular Disease

## 2013-09-06 ENCOUNTER — Encounter: Payer: Self-pay | Admitting: Cardiovascular Disease

## 2013-09-06 VITALS — BP 201/87 | Ht 65.0 in | Wt 163.1 lb

## 2013-09-06 DIAGNOSIS — I2510 Atherosclerotic heart disease of native coronary artery without angina pectoris: Secondary | ICD-10-CM | POA: Diagnosis not present

## 2013-09-06 DIAGNOSIS — I10 Essential (primary) hypertension: Secondary | ICD-10-CM | POA: Diagnosis not present

## 2013-09-06 DIAGNOSIS — R079 Chest pain, unspecified: Secondary | ICD-10-CM

## 2013-09-06 DIAGNOSIS — Q211 Atrial septal defect, unspecified: Secondary | ICD-10-CM

## 2013-09-06 DIAGNOSIS — E785 Hyperlipidemia, unspecified: Secondary | ICD-10-CM

## 2013-09-06 DIAGNOSIS — E119 Type 2 diabetes mellitus without complications: Secondary | ICD-10-CM

## 2013-09-06 DIAGNOSIS — Q2111 Secundum atrial septal defect: Secondary | ICD-10-CM

## 2013-09-06 MED ORDER — ISOSORBIDE MONONITRATE ER 30 MG PO TB24
30.0000 mg | ORAL_TABLET | Freq: Every day | ORAL | Status: DC
Start: 2013-09-06 — End: 2014-03-07

## 2013-09-06 MED ORDER — AMLODIPINE BESYLATE 5 MG PO TABS
5.0000 mg | ORAL_TABLET | Freq: Every day | ORAL | Status: DC
Start: 2013-09-06 — End: 2014-03-07

## 2013-09-06 NOTE — Patient Instructions (Addendum)
Your physician has recommended you make the following change in your medication: start prescriptions for amlodipine and isosorbide. These has already been sent to the pharmacy.  Your physician recommends that you schedule a follow-up appointment in: 4 weeks with extender. 3 months with Dr. Claiborne Billings.

## 2013-09-08 ENCOUNTER — Encounter: Payer: Self-pay | Admitting: Cardiovascular Disease
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
## 2013-09-24 DIAGNOSIS — Z79899 Other long term (current) drug therapy: Secondary | ICD-10-CM | POA: Diagnosis not present

## 2013-09-24 DIAGNOSIS — M531 Cervicobrachial syndrome: Secondary | ICD-10-CM | POA: Diagnosis not present

## 2013-09-24 DIAGNOSIS — M47812 Spondylosis without myelopathy or radiculopathy, cervical region: Secondary | ICD-10-CM | POA: Diagnosis not present

## 2013-09-24 DIAGNOSIS — G894 Chronic pain syndrome: Secondary | ICD-10-CM | POA: Diagnosis not present

## 2013-09-25 DIAGNOSIS — Z23 Encounter for immunization: Secondary | ICD-10-CM | POA: Diagnosis not present

## 2013-10-24 ENCOUNTER — Other Ambulatory Visit: Payer: Self-pay | Admitting: Cardiovascular Disease

## 2013-10-24 NOTE — Telephone Encounter (Signed)
Rx was sent to pharmacy electronically. 

## 2013-10-30 ENCOUNTER — Other Ambulatory Visit (HOSPITAL_COMMUNITY): Payer: Self-pay | Admitting: Internal Medicine

## 2013-10-30 DIAGNOSIS — G43009 Migraine without aura, not intractable, without status migrainosus: Secondary | ICD-10-CM | POA: Diagnosis not present

## 2013-10-30 DIAGNOSIS — F411 Generalized anxiety disorder: Secondary | ICD-10-CM | POA: Diagnosis not present

## 2013-10-30 DIAGNOSIS — Z6831 Body mass index (BMI) 31.0-31.9, adult: Secondary | ICD-10-CM | POA: Diagnosis not present

## 2013-10-30 DIAGNOSIS — Z1231 Encounter for screening mammogram for malignant neoplasm of breast: Secondary | ICD-10-CM

## 2013-10-30 DIAGNOSIS — M531 Cervicobrachial syndrome: Secondary | ICD-10-CM | POA: Diagnosis not present

## 2013-10-30 DIAGNOSIS — Z79899 Other long term (current) drug therapy: Secondary | ICD-10-CM | POA: Diagnosis not present

## 2013-10-30 DIAGNOSIS — G8929 Other chronic pain: Secondary | ICD-10-CM | POA: Diagnosis not present

## 2013-10-30 DIAGNOSIS — G894 Chronic pain syndrome: Secondary | ICD-10-CM | POA: Diagnosis not present

## 2013-10-30 DIAGNOSIS — M47812 Spondylosis without myelopathy or radiculopathy, cervical region: Secondary | ICD-10-CM | POA: Diagnosis not present

## 2013-11-05 ENCOUNTER — Ambulatory Visit (HOSPITAL_COMMUNITY)
Admission: RE | Admit: 2013-11-05 | Discharge: 2013-11-05 | Disposition: A | Discharge status: Home / Self Care | Payer: Medicare Other | Source: Ambulatory Visit | Attending: Internal Medicine | Admitting: Internal Medicine

## 2013-11-05 DIAGNOSIS — Z1231 Encounter for screening mammogram for malignant neoplasm of breast: Secondary | ICD-10-CM | POA: Diagnosis not present

## 2013-11-26 DIAGNOSIS — M531 Cervicobrachial syndrome: Secondary | ICD-10-CM | POA: Diagnosis not present

## 2013-11-26 DIAGNOSIS — Z79899 Other long term (current) drug therapy: Secondary | ICD-10-CM | POA: Diagnosis not present

## 2013-11-26 DIAGNOSIS — M47812 Spondylosis without myelopathy or radiculopathy, cervical region: Secondary | ICD-10-CM | POA: Diagnosis not present

## 2013-11-26 DIAGNOSIS — G894 Chronic pain syndrome: Secondary | ICD-10-CM | POA: Diagnosis not present

## 2013-12-10 DIAGNOSIS — Z0000 Encounter for general adult medical examination without abnormal findings: Secondary | ICD-10-CM | POA: Diagnosis not present

## 2013-12-10 DIAGNOSIS — R945 Abnormal results of liver function studies: Secondary | ICD-10-CM | POA: Diagnosis not present

## 2013-12-10 DIAGNOSIS — K769 Liver disease, unspecified: Secondary | ICD-10-CM | POA: Diagnosis not present

## 2013-12-10 DIAGNOSIS — Z6830 Body mass index (BMI) 30.0-30.9, adult: Secondary | ICD-10-CM | POA: Diagnosis not present

## 2013-12-10 DIAGNOSIS — E1149 Type 2 diabetes mellitus with other diabetic neurological complication: Secondary | ICD-10-CM | POA: Diagnosis not present

## 2013-12-12 ENCOUNTER — Other Ambulatory Visit (HOSPITAL_COMMUNITY): Payer: Self-pay | Admitting: Internal Medicine

## 2013-12-12 DIAGNOSIS — R7401 Elevation of levels of liver transaminase levels: Secondary | ICD-10-CM

## 2013-12-12 DIAGNOSIS — R748 Abnormal levels of other serum enzymes: Secondary | ICD-10-CM

## 2013-12-12 DIAGNOSIS — R945 Abnormal results of liver function studies: Secondary | ICD-10-CM

## 2013-12-12 DIAGNOSIS — R740 Nonspecific elevation of levels of transaminase and lactic acid dehydrogenase [LDH]: Secondary | ICD-10-CM

## 2013-12-12 DIAGNOSIS — R7989 Other specified abnormal findings of blood chemistry: Secondary | ICD-10-CM

## 2013-12-12 DIAGNOSIS — K769 Liver disease, unspecified: Secondary | ICD-10-CM

## 2013-12-18 ENCOUNTER — Ambulatory Visit (HOSPITAL_COMMUNITY)
Admission: RE | Admit: 2013-12-18 | Discharge: 2013-12-18 | Disposition: A | Discharge status: Home / Self Care | Payer: Medicare Other | Source: Ambulatory Visit | Attending: Internal Medicine | Admitting: Internal Medicine

## 2013-12-18 DIAGNOSIS — R740 Nonspecific elevation of levels of transaminase and lactic acid dehydrogenase [LDH]: Secondary | ICD-10-CM

## 2013-12-18 DIAGNOSIS — R7989 Other specified abnormal findings of blood chemistry: Secondary | ICD-10-CM | POA: Insufficient documentation

## 2013-12-18 DIAGNOSIS — K7689 Other specified diseases of liver: Secondary | ICD-10-CM | POA: Diagnosis not present

## 2013-12-18 DIAGNOSIS — R7401 Elevation of levels of liver transaminase levels: Secondary | ICD-10-CM

## 2013-12-18 DIAGNOSIS — R748 Abnormal levels of other serum enzymes: Secondary | ICD-10-CM

## 2013-12-18 DIAGNOSIS — R945 Abnormal results of liver function studies: Secondary | ICD-10-CM

## 2013-12-18 DIAGNOSIS — K769 Liver disease, unspecified: Secondary | ICD-10-CM

## 2013-12-25 DIAGNOSIS — Z79899 Other long term (current) drug therapy: Secondary | ICD-10-CM | POA: Diagnosis not present

## 2013-12-25 DIAGNOSIS — M47812 Spondylosis without myelopathy or radiculopathy, cervical region: Secondary | ICD-10-CM | POA: Diagnosis not present

## 2013-12-25 DIAGNOSIS — G894 Chronic pain syndrome: Secondary | ICD-10-CM | POA: Diagnosis not present

## 2013-12-25 DIAGNOSIS — M531 Cervicobrachial syndrome: Secondary | ICD-10-CM | POA: Diagnosis not present

## 2014-01-12 DIAGNOSIS — N390 Urinary tract infection, site not specified: Secondary | ICD-10-CM | POA: Diagnosis not present

## 2014-01-12 DIAGNOSIS — Z6830 Body mass index (BMI) 30.0-30.9, adult: Secondary | ICD-10-CM | POA: Diagnosis not present

## 2014-01-22 DIAGNOSIS — Z79899 Other long term (current) drug therapy: Secondary | ICD-10-CM | POA: Diagnosis not present

## 2014-01-22 DIAGNOSIS — G894 Chronic pain syndrome: Secondary | ICD-10-CM | POA: Diagnosis not present

## 2014-01-22 DIAGNOSIS — M47812 Spondylosis without myelopathy or radiculopathy, cervical region: Secondary | ICD-10-CM | POA: Diagnosis not present

## 2014-01-22 DIAGNOSIS — M531 Cervicobrachial syndrome: Secondary | ICD-10-CM | POA: Diagnosis not present

## 2014-02-19 DIAGNOSIS — E1142 Type 2 diabetes mellitus with diabetic polyneuropathy: Secondary | ICD-10-CM | POA: Diagnosis not present

## 2014-02-19 DIAGNOSIS — M5030 Other cervical disc degeneration, unspecified cervical region: Secondary | ICD-10-CM | POA: Diagnosis not present

## 2014-02-19 DIAGNOSIS — Z79899 Other long term (current) drug therapy: Secondary | ICD-10-CM | POA: Diagnosis not present

## 2014-02-19 DIAGNOSIS — E1149 Type 2 diabetes mellitus with other diabetic neurological complication: Secondary | ICD-10-CM | POA: Diagnosis not present

## 2014-02-19 DIAGNOSIS — G894 Chronic pain syndrome: Secondary | ICD-10-CM | POA: Diagnosis not present

## 2014-03-07 ENCOUNTER — Other Ambulatory Visit: Payer: Self-pay | Admitting: Cardiovascular Disease

## 2014-03-07 NOTE — Telephone Encounter (Signed)
Rx was sent to pharmacy electronically. 

## 2014-03-19 DIAGNOSIS — Z79891 Long term (current) use of opiate analgesic: Secondary | ICD-10-CM | POA: Diagnosis not present

## 2014-03-19 DIAGNOSIS — E1142 Type 2 diabetes mellitus with diabetic polyneuropathy: Secondary | ICD-10-CM | POA: Diagnosis not present

## 2014-03-19 DIAGNOSIS — M47812 Spondylosis without myelopathy or radiculopathy, cervical region: Secondary | ICD-10-CM | POA: Diagnosis not present

## 2014-03-19 DIAGNOSIS — G894 Chronic pain syndrome: Secondary | ICD-10-CM | POA: Diagnosis not present

## 2014-04-01 ENCOUNTER — Encounter: Payer: Self-pay | Admitting: Cardiovascular Disease

## 2014-04-01 DIAGNOSIS — F419 Anxiety disorder, unspecified: Secondary | ICD-10-CM | POA: Diagnosis not present

## 2014-04-01 DIAGNOSIS — Z6829 Body mass index (BMI) 29.0-29.9, adult: Secondary | ICD-10-CM | POA: Diagnosis not present

## 2014-04-01 DIAGNOSIS — K529 Noninfective gastroenteritis and colitis, unspecified: Secondary | ICD-10-CM | POA: Diagnosis not present

## 2014-04-16 DIAGNOSIS — E1142 Type 2 diabetes mellitus with diabetic polyneuropathy: Secondary | ICD-10-CM | POA: Diagnosis not present

## 2014-04-16 DIAGNOSIS — M47812 Spondylosis without myelopathy or radiculopathy, cervical region: Secondary | ICD-10-CM | POA: Diagnosis not present

## 2014-04-16 DIAGNOSIS — Z79891 Long term (current) use of opiate analgesic: Secondary | ICD-10-CM | POA: Diagnosis not present

## 2014-04-16 DIAGNOSIS — G894 Chronic pain syndrome: Secondary | ICD-10-CM | POA: Diagnosis not present

## 2014-05-07 DIAGNOSIS — N342 Other urethritis: Secondary | ICD-10-CM | POA: Diagnosis not present

## 2014-05-07 DIAGNOSIS — N390 Urinary tract infection, site not specified: Secondary | ICD-10-CM | POA: Diagnosis not present

## 2014-05-07 DIAGNOSIS — Z6829 Body mass index (BMI) 29.0-29.9, adult: Secondary | ICD-10-CM | POA: Diagnosis not present

## 2014-05-07 DIAGNOSIS — G629 Polyneuropathy, unspecified: Secondary | ICD-10-CM | POA: Diagnosis not present

## 2014-05-07 DIAGNOSIS — E663 Overweight: Secondary | ICD-10-CM | POA: Diagnosis not present

## 2014-05-15 DIAGNOSIS — M25569 Pain in unspecified knee: Secondary | ICD-10-CM | POA: Diagnosis not present

## 2014-05-15 DIAGNOSIS — Z79891 Long term (current) use of opiate analgesic: Secondary | ICD-10-CM | POA: Diagnosis not present

## 2014-05-15 DIAGNOSIS — G894 Chronic pain syndrome: Secondary | ICD-10-CM | POA: Diagnosis not present

## 2014-05-15 DIAGNOSIS — M47812 Spondylosis without myelopathy or radiculopathy, cervical region: Secondary | ICD-10-CM | POA: Diagnosis not present

## 2014-06-12 DIAGNOSIS — M47812 Spondylosis without myelopathy or radiculopathy, cervical region: Secondary | ICD-10-CM | POA: Diagnosis not present

## 2014-06-12 DIAGNOSIS — Z79891 Long term (current) use of opiate analgesic: Secondary | ICD-10-CM | POA: Diagnosis not present

## 2014-06-12 DIAGNOSIS — M25569 Pain in unspecified knee: Secondary | ICD-10-CM | POA: Diagnosis not present

## 2014-06-12 DIAGNOSIS — G894 Chronic pain syndrome: Secondary | ICD-10-CM | POA: Diagnosis not present

## 2014-06-25 DIAGNOSIS — Z6828 Body mass index (BMI) 28.0-28.9, adult: Secondary | ICD-10-CM | POA: Diagnosis not present

## 2014-06-25 DIAGNOSIS — E1140 Type 2 diabetes mellitus with diabetic neuropathy, unspecified: Secondary | ICD-10-CM | POA: Diagnosis not present

## 2014-06-25 DIAGNOSIS — G894 Chronic pain syndrome: Secondary | ICD-10-CM | POA: Diagnosis not present

## 2014-06-25 DIAGNOSIS — R252 Cramp and spasm: Secondary | ICD-10-CM | POA: Diagnosis not present

## 2014-06-25 DIAGNOSIS — E663 Overweight: Secondary | ICD-10-CM | POA: Diagnosis not present

## 2014-07-11 DIAGNOSIS — M25569 Pain in unspecified knee: Secondary | ICD-10-CM | POA: Diagnosis not present

## 2014-07-11 DIAGNOSIS — G894 Chronic pain syndrome: Secondary | ICD-10-CM | POA: Diagnosis not present

## 2014-07-11 DIAGNOSIS — M47812 Spondylosis without myelopathy or radiculopathy, cervical region: Secondary | ICD-10-CM | POA: Diagnosis not present

## 2014-07-11 DIAGNOSIS — Z79891 Long term (current) use of opiate analgesic: Secondary | ICD-10-CM | POA: Diagnosis not present

## 2014-08-08 ENCOUNTER — Ambulatory Visit (INDEPENDENT_AMBULATORY_CARE_PROVIDER_SITE_OTHER): Payer: Medicare Other | Admitting: Cardiology

## 2014-08-08 ENCOUNTER — Encounter: Payer: Self-pay | Admitting: Cardiology

## 2014-08-08 VITALS — BP 140/68 | HR 67 | Ht 65.0 in | Wt 144.3 lb

## 2014-08-08 DIAGNOSIS — I2510 Atherosclerotic heart disease of native coronary artery without angina pectoris: Secondary | ICD-10-CM

## 2014-08-08 DIAGNOSIS — M25569 Pain in unspecified knee: Secondary | ICD-10-CM | POA: Diagnosis not present

## 2014-08-08 DIAGNOSIS — Z720 Tobacco use: Secondary | ICD-10-CM

## 2014-08-08 DIAGNOSIS — Z79891 Long term (current) use of opiate analgesic: Secondary | ICD-10-CM | POA: Diagnosis not present

## 2014-08-08 DIAGNOSIS — R0789 Other chest pain: Secondary | ICD-10-CM | POA: Diagnosis not present

## 2014-08-08 DIAGNOSIS — Q211 Atrial septal defect, unspecified: Secondary | ICD-10-CM

## 2014-08-08 DIAGNOSIS — T783XXD Angioneurotic edema, subsequent encounter: Secondary | ICD-10-CM | POA: Diagnosis not present

## 2014-08-08 DIAGNOSIS — M47812 Spondylosis without myelopathy or radiculopathy, cervical region: Secondary | ICD-10-CM | POA: Diagnosis not present

## 2014-08-08 DIAGNOSIS — G894 Chronic pain syndrome: Secondary | ICD-10-CM | POA: Diagnosis not present

## 2014-08-08 DIAGNOSIS — R079 Chest pain, unspecified: Secondary | ICD-10-CM | POA: Insufficient documentation

## 2014-08-08 MED ORDER — ISOSORBIDE MONONITRATE ER 30 MG PO TB24: 45.0000 mg | ORAL_TABLET | Freq: Every day | ORAL | Status: DC

## 2014-08-08 NOTE — Assessment & Plan Note (Signed)
Nov 2014- No ACE or ARB

## 2014-08-08 NOTE — Assessment & Plan Note (Signed)
Pt is here or annual check up and noted intermittent SSCP associated with emotional stress.

## 2014-08-08 NOTE — Assessment & Plan Note (Signed)
She is now smoking [employer] C[employer], working on decreasing her nicotine dose

## 2014-08-08 NOTE — Assessment & Plan Note (Signed)
Low risk Myoview 2014

## 2014-08-08 NOTE — Assessment & Plan Note (Signed)
Under good control on current medications 
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
## 2014-08-08 NOTE — Patient Instructions (Signed)
INCREASE your IMDUR to 45mg  (1.5 tablets) daily  Your physician wants you to follow-up in: [place] with Dr.Kelly. You will receive a reminder letter in the mail two months in advance. If you don't receive a letter, please call our office to schedule the follow-up appointment.

## 2014-08-08 NOTE — Assessment & Plan Note (Signed)
Neuropathy, chronic pain syndrome

## 2014-08-16 DIAGNOSIS — R590 Localized enlarged lymph nodes: Secondary | ICD-10-CM | POA: Diagnosis not present

## 2014-08-16 DIAGNOSIS — Z6827 Body mass index (BMI) 27.0-27.9, adult: Secondary | ICD-10-CM | POA: Diagnosis not present

## 2014-08-16 DIAGNOSIS — E663 Overweight: Secondary | ICD-10-CM | POA: Diagnosis not present

## 2014-08-22 DIAGNOSIS — L03211 Cellulitis of face: Secondary | ICD-10-CM | POA: Diagnosis not present

## 2014-08-22 DIAGNOSIS — E663 Overweight: Secondary | ICD-10-CM | POA: Diagnosis not present

## 2014-08-22 DIAGNOSIS — E1140 Type 2 diabetes mellitus with diabetic neuropathy, unspecified: Secondary | ICD-10-CM | POA: Diagnosis not present

## 2014-08-22 DIAGNOSIS — G894 Chronic pain syndrome: Secondary | ICD-10-CM | POA: Diagnosis not present

## 2014-08-22 DIAGNOSIS — I10 Essential (primary) hypertension: Secondary | ICD-10-CM | POA: Diagnosis not present

## 2014-08-22 DIAGNOSIS — Z6827 Body mass index (BMI) 27.0-27.9, adult: Secondary | ICD-10-CM | POA: Diagnosis not present

## 2014-09-13 DIAGNOSIS — Z79891 Long term (current) use of opiate analgesic: Secondary | ICD-10-CM | POA: Diagnosis not present

## 2014-09-13 DIAGNOSIS — M47812 Spondylosis without myelopathy or radiculopathy, cervical region: Secondary | ICD-10-CM | POA: Diagnosis not present

## 2014-09-13 DIAGNOSIS — G894 Chronic pain syndrome: Secondary | ICD-10-CM | POA: Diagnosis not present

## 2014-10-10 DIAGNOSIS — Z79891 Long term (current) use of opiate analgesic: Secondary | ICD-10-CM | POA: Diagnosis not present

## 2014-10-10 DIAGNOSIS — M47812 Spondylosis without myelopathy or radiculopathy, cervical region: Secondary | ICD-10-CM | POA: Diagnosis not present

## 2014-10-10 DIAGNOSIS — G894 Chronic pain syndrome: Secondary | ICD-10-CM | POA: Diagnosis not present

## 2014-10-22 DIAGNOSIS — J209 Acute bronchitis, unspecified: Secondary | ICD-10-CM | POA: Diagnosis not present

## 2014-10-22 DIAGNOSIS — J9801 Acute bronchospasm: Secondary | ICD-10-CM | POA: Diagnosis not present

## 2014-10-22 DIAGNOSIS — F419 Anxiety disorder, unspecified: Secondary | ICD-10-CM | POA: Diagnosis not present

## 2014-10-22 DIAGNOSIS — J0100 Acute maxillary sinusitis, unspecified: Secondary | ICD-10-CM | POA: Diagnosis not present

## 2014-10-22 DIAGNOSIS — Z6826 Body mass index (BMI) 26.0-26.9, adult: Secondary | ICD-10-CM | POA: Diagnosis not present

## 2014-10-22 DIAGNOSIS — E663 Overweight: Secondary | ICD-10-CM | POA: Diagnosis not present

## 2014-10-22 DIAGNOSIS — I10 Essential (primary) hypertension: Secondary | ICD-10-CM | POA: Diagnosis not present

## 2014-10-22 DIAGNOSIS — G894 Chronic pain syndrome: Secondary | ICD-10-CM | POA: Diagnosis not present

## 2014-11-07 ENCOUNTER — Other Ambulatory Visit: Payer: Self-pay | Admitting: Cardiovascular Disease

## 2014-11-07 DIAGNOSIS — Z79891 Long term (current) use of opiate analgesic: Secondary | ICD-10-CM | POA: Diagnosis not present

## 2014-11-07 DIAGNOSIS — M47812 Spondylosis without myelopathy or radiculopathy, cervical region: Secondary | ICD-10-CM | POA: Diagnosis not present

## 2014-11-07 DIAGNOSIS — G894 Chronic pain syndrome: Secondary | ICD-10-CM | POA: Diagnosis not present

## 2014-11-07 NOTE — Telephone Encounter (Signed)
Rx(s) sent to pharmacy electronically.  

## 2014-11-14 ENCOUNTER — Other Ambulatory Visit: Payer: Self-pay | Admitting: Cardiovascular Disease

## 2014-11-14 NOTE — Telephone Encounter (Signed)
Rx(s) sent to pharmacy electronically.  

## 2014-12-03 ENCOUNTER — Telehealth: Payer: Self-pay | Admitting: Cardiovascular Disease

## 2014-12-03 NOTE — Telephone Encounter (Signed)
Spoke with patient who reports she experience chest pain over the weekend. She reports on Saturday she took NTGx3 with relief but then the pain came back. She took 2 NTG on Sunday and 1 yesterday. She has no pain today. She had SOB on Saturday & Sunday. She reports she thinks her chest pain is from the "tremendous stress" she is under - both of her patients have Alzheimer's, her mother-in-law has Alzheimer's and is on dialysis and was told she only has a few days to live and her cousin was murdered 3 weeks ago. She reports she has nerve damage in her head and gets headaches worse than migraines. She reports she feels like her heart is racing and she takes her aspirin as prescribe. She reports her BP is up to 175-180s/90s. Patient reports she had originally called in to make an appointment with Dr. Claiborne Billings per her recall letter but reported her symptoms to the operator. She was offered appointment with Lurena Joiner, [place] tomorrow 7/6 @ 830 but she is unable to take this appointment as her parent has a doctor's appointment, she does not drive and relies on husband for transportation. Patient scheduled to see Dr. Claiborne Billings in September but she was advised to monitor symptoms - if chest pain such as this weekend occurs again and she has to take NTG/has relief from NTG she should seek eval in ED. She voiced understanding and will call back if she needs further advice.

## 2014-12-03 NOTE — Telephone Encounter (Signed)
Patient scheduled to see L. Dorene Ar, NP 7/7 @ 2pm at [facility] - patient given office address and phone number and is aware of appointment date/time

## 2014-12-03 NOTE — Telephone Encounter (Signed)
Pt c/o of Chest Pain: STAT if CP now or developed within 24 hours  1. Are you having CP right now? no  2. Are you experiencing any other symptoms (ex. SOB, nausea, vomiting, sweating)? Had some nausea and some arm pain . Bp was a little high   3. How long have you been experiencing CP?Since Saturday   4. Is your CP continuous or coming and going? Coming and going   5. Have you taken Nitroglycerin? Yes ( took it 3 times on Saturday and twice on  Sunday and none on Monday )  ?

## 2014-12-05 ENCOUNTER — Ambulatory Visit (INDEPENDENT_AMBULATORY_CARE_PROVIDER_SITE_OTHER): Payer: Medicare Other | Admitting: Cardiology

## 2014-12-05 ENCOUNTER — Encounter (HOSPITAL_COMMUNITY): Payer: Self-pay

## 2014-12-05 ENCOUNTER — Emergency Department (HOSPITAL_COMMUNITY)
Admission: EM | Admit: 2014-12-05 | Discharge: 2014-12-05 | Disposition: A | Discharge status: Home / Self Care | Payer: Medicare Other | Attending: Emergency Medicine | Admitting: Emergency Medicine

## 2014-12-05 ENCOUNTER — Emergency Department (HOSPITAL_COMMUNITY): Payer: Medicare Other

## 2014-12-05 ENCOUNTER — Encounter: Payer: Self-pay | Admitting: Cardiology

## 2014-12-05 VITALS — BP 128/68 | HR 59 | Ht 65.0 in | Wt 136.1 lb

## 2014-12-05 DIAGNOSIS — Z8742 Personal history of other diseases of the female genital tract: Secondary | ICD-10-CM | POA: Insufficient documentation

## 2014-12-05 DIAGNOSIS — R1010 Upper abdominal pain, unspecified: Secondary | ICD-10-CM | POA: Diagnosis not present

## 2014-12-05 DIAGNOSIS — R079 Chest pain, unspecified: Secondary | ICD-10-CM | POA: Diagnosis not present

## 2014-12-05 DIAGNOSIS — E785 Hyperlipidemia, unspecified: Secondary | ICD-10-CM | POA: Diagnosis not present

## 2014-12-05 DIAGNOSIS — E119 Type 2 diabetes mellitus without complications: Secondary | ICD-10-CM | POA: Insufficient documentation

## 2014-12-05 DIAGNOSIS — N3000 Acute cystitis without hematuria: Secondary | ICD-10-CM | POA: Insufficient documentation

## 2014-12-05 DIAGNOSIS — Z880 Allergy status to penicillin: Secondary | ICD-10-CM | POA: Diagnosis not present

## 2014-12-05 DIAGNOSIS — R0789 Other chest pain: Secondary | ICD-10-CM | POA: Insufficient documentation

## 2014-12-05 DIAGNOSIS — F419 Anxiety disorder, unspecified: Secondary | ICD-10-CM | POA: Insufficient documentation

## 2014-12-05 DIAGNOSIS — R319 Hematuria, unspecified: Secondary | ICD-10-CM | POA: Diagnosis not present

## 2014-12-05 DIAGNOSIS — Q211 Atrial septal defect, unspecified: Secondary | ICD-10-CM

## 2014-12-05 DIAGNOSIS — I2510 Atherosclerotic heart disease of native coronary artery without angina pectoris: Secondary | ICD-10-CM

## 2014-12-05 DIAGNOSIS — Z87440 Personal history of urinary (tract) infections: Secondary | ICD-10-CM | POA: Insufficient documentation

## 2014-12-05 DIAGNOSIS — Z87891 Personal history of nicotine dependence: Secondary | ICD-10-CM | POA: Insufficient documentation

## 2014-12-05 DIAGNOSIS — I10 Essential (primary) hypertension: Secondary | ICD-10-CM | POA: Insufficient documentation

## 2014-12-05 DIAGNOSIS — I25119 Atherosclerotic heart disease of native coronary artery with unspecified angina pectoris: Secondary | ICD-10-CM | POA: Insufficient documentation

## 2014-12-05 DIAGNOSIS — G8929 Other chronic pain: Secondary | ICD-10-CM | POA: Insufficient documentation

## 2014-12-05 DIAGNOSIS — Z7982 Long term (current) use of aspirin: Secondary | ICD-10-CM | POA: Diagnosis not present

## 2014-12-05 DIAGNOSIS — Z862 Personal history of diseases of the blood and blood-forming organs and certain disorders involving the immune mechanism: Secondary | ICD-10-CM | POA: Diagnosis not present

## 2014-12-05 DIAGNOSIS — F329 Major depressive disorder, single episode, unspecified: Secondary | ICD-10-CM | POA: Diagnosis not present

## 2014-12-05 LAB — I-STAT TROPONIN, ED
Troponin i, poc: 0.01 ng/mL (ref 0.00–0.08)
Troponin i, poc: 0.01 ng/mL (ref 0.00–0.08)

## 2014-12-05 LAB — URINE MICROSCOPIC-ADD ON

## 2014-12-05 LAB — URINALYSIS, ROUTINE W REFLEX MICROSCOPIC
GLUCOSE, UA: NEGATIVE mg/dL
KETONES UR: 15 mg/dL — AB
NITRITE: POSITIVE — AB
Protein, ur: 30 mg/dL — AB
Specific Gravity, Urine: 1.016 (ref 1.005–1.030)
Urobilinogen, UA: >8 mg/dL — ABNORMAL HIGH (ref 0.0–1.0)
pH: 5 (ref 5.0–8.0)

## 2014-12-05 LAB — BASIC METABOLIC PANEL
Anion gap: 9 (ref 5–15)
BUN: 12 mg/dL (ref 6–20)
CO2: 29 mmol/L (ref 22–32)
CREATININE: 0.85 mg/dL (ref 0.44–1.00)
Calcium: 10 mg/dL (ref 8.9–10.3)
Chloride: 102 mmol/L (ref 101–111)
GFR calc Af Amer: >60 mL/min (ref >60)
Glucose, Bld: 92 mg/dL (ref 65–99)
Potassium: 4.6 mmol/L (ref 3.5–5.1)
Sodium: 140 mmol/L (ref 135–145)

## 2014-12-05 LAB — CBC
HCT: 39.9 % (ref 36.0–46.0)
Hemoglobin: 13.5 g/dL (ref 12.0–15.0)
MCH: 28.5 pg (ref 26.0–34.0)
MCHC: 33.8 g/dL (ref 30.0–36.0)
MCV: 84.2 fL (ref 78.0–100.0)
PLATELETS: 294 10*3/uL (ref 150–400)
RBC: 4.74 MIL/uL (ref 3.87–5.11)
RDW: 13.1 % (ref 11.5–15.5)
WBC: 7.8 10*3/uL (ref 4.0–10.5)

## 2014-12-05 MED ORDER — NITROFURANTOIN MONOHYD MACRO 100 MG PO CAPS: 100.0000 mg | ORAL_CAPSULE | Freq: Two times a day (BID) | ORAL | Status: DC

## 2014-12-05 NOTE — ED Notes (Signed)
Pt here from MD office abd pain and chest pain for about a week. Has some nerve damage that she has nausea with so not sure if the nausea is from that or the chest pain.

## 2014-12-05 NOTE — Discharge Instructions (Signed)
Return here as needed.  Follow-up with your cardiologist.  Your testing here today did not show any significant abnormalities
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
## 2014-12-05 NOTE — Patient Instructions (Addendum)
Please have someone drive you straight to  Emergency Room for further work-up.

## 2014-12-10 DIAGNOSIS — G894 Chronic pain syndrome: Secondary | ICD-10-CM | POA: Diagnosis not present

## 2014-12-10 DIAGNOSIS — M47812 Spondylosis without myelopathy or radiculopathy, cervical region: Secondary | ICD-10-CM | POA: Diagnosis not present

## 2014-12-10 DIAGNOSIS — R51 Headache: Secondary | ICD-10-CM | POA: Diagnosis not present

## 2014-12-10 DIAGNOSIS — Z79891 Long term (current) use of opiate analgesic: Secondary | ICD-10-CM | POA: Diagnosis not present

## 2015-01-10 DIAGNOSIS — R51 Headache: Secondary | ICD-10-CM | POA: Diagnosis not present

## 2015-01-10 DIAGNOSIS — Z79891 Long term (current) use of opiate analgesic: Secondary | ICD-10-CM | POA: Diagnosis not present

## 2015-01-10 DIAGNOSIS — M47812 Spondylosis without myelopathy or radiculopathy, cervical region: Secondary | ICD-10-CM | POA: Diagnosis not present

## 2015-01-10 DIAGNOSIS — G894 Chronic pain syndrome: Secondary | ICD-10-CM | POA: Diagnosis not present

## 2015-01-23 ENCOUNTER — Other Ambulatory Visit (HOSPITAL_COMMUNITY): Payer: Self-pay | Admitting: Internal Medicine

## 2015-01-23 DIAGNOSIS — R109 Unspecified abdominal pain: Secondary | ICD-10-CM

## 2015-01-23 DIAGNOSIS — Z1389 Encounter for screening for other disorder: Secondary | ICD-10-CM

## 2015-01-23 DIAGNOSIS — R1012 Left upper quadrant pain: Secondary | ICD-10-CM

## 2015-01-23 DIAGNOSIS — E1140 Type 2 diabetes mellitus with diabetic neuropathy, unspecified: Secondary | ICD-10-CM | POA: Diagnosis not present

## 2015-01-23 DIAGNOSIS — F419 Anxiety disorder, unspecified: Secondary | ICD-10-CM | POA: Diagnosis not present

## 2015-01-23 DIAGNOSIS — E119 Type 2 diabetes mellitus without complications: Secondary | ICD-10-CM | POA: Diagnosis not present

## 2015-01-23 DIAGNOSIS — G894 Chronic pain syndrome: Secondary | ICD-10-CM | POA: Diagnosis not present

## 2015-01-23 DIAGNOSIS — E663 Overweight: Secondary | ICD-10-CM | POA: Diagnosis not present

## 2015-01-23 DIAGNOSIS — Z6826 Body mass index (BMI) 26.0-26.9, adult: Secondary | ICD-10-CM | POA: Diagnosis not present

## 2015-01-27 ENCOUNTER — Other Ambulatory Visit (HOSPITAL_COMMUNITY): Payer: Medicare Other

## 2015-02-04 ENCOUNTER — Encounter (HOSPITAL_COMMUNITY): Payer: Self-pay | Admitting: Emergency Medicine

## 2015-02-04 ENCOUNTER — Ambulatory Visit (HOSPITAL_COMMUNITY)
Admission: RE | Admit: 2015-02-04 | Discharge: 2015-02-04 | Disposition: A | Discharge status: Home / Self Care | Payer: Medicare Other | Source: Ambulatory Visit | Attending: Internal Medicine | Admitting: Internal Medicine

## 2015-02-04 ENCOUNTER — Emergency Department (HOSPITAL_COMMUNITY)
Admission: EM | Admit: 2015-02-04 | Discharge: 2015-02-04 | Disposition: A | Discharge status: Home / Self Care | Payer: Medicare Other | Attending: Emergency Medicine | Admitting: Emergency Medicine

## 2015-02-04 DIAGNOSIS — R1032 Left lower quadrant pain: Secondary | ICD-10-CM | POA: Insufficient documentation

## 2015-02-04 DIAGNOSIS — Z87891 Personal history of nicotine dependence: Secondary | ICD-10-CM | POA: Insufficient documentation

## 2015-02-04 DIAGNOSIS — G8929 Other chronic pain: Secondary | ICD-10-CM | POA: Insufficient documentation

## 2015-02-04 DIAGNOSIS — E119 Type 2 diabetes mellitus without complications: Secondary | ICD-10-CM | POA: Insufficient documentation

## 2015-02-04 DIAGNOSIS — N390 Urinary tract infection, site not specified: Secondary | ICD-10-CM | POA: Diagnosis not present

## 2015-02-04 DIAGNOSIS — Z79899 Other long term (current) drug therapy: Secondary | ICD-10-CM | POA: Diagnosis not present

## 2015-02-04 DIAGNOSIS — R1110 Vomiting, unspecified: Secondary | ICD-10-CM | POA: Diagnosis not present

## 2015-02-04 DIAGNOSIS — Z7952 Long term (current) use of systemic steroids: Secondary | ICD-10-CM | POA: Diagnosis not present

## 2015-02-04 DIAGNOSIS — Z880 Allergy status to penicillin: Secondary | ICD-10-CM | POA: Diagnosis not present

## 2015-02-04 DIAGNOSIS — I2510 Atherosclerotic heart disease of native coronary artery without angina pectoris: Secondary | ICD-10-CM | POA: Insufficient documentation

## 2015-02-04 DIAGNOSIS — I10 Essential (primary) hypertension: Secondary | ICD-10-CM | POA: Diagnosis not present

## 2015-02-04 DIAGNOSIS — Z862 Personal history of diseases of the blood and blood-forming organs and certain disorders involving the immune mechanism: Secondary | ICD-10-CM | POA: Diagnosis not present

## 2015-02-04 DIAGNOSIS — N8110 Cystocele, unspecified: Secondary | ICD-10-CM

## 2015-02-04 DIAGNOSIS — Z1389 Encounter for screening for other disorder: Secondary | ICD-10-CM

## 2015-02-04 DIAGNOSIS — R918 Other nonspecific abnormal finding of lung field: Secondary | ICD-10-CM

## 2015-02-04 DIAGNOSIS — R109 Unspecified abdominal pain: Secondary | ICD-10-CM | POA: Insufficient documentation

## 2015-02-04 DIAGNOSIS — Z8774 Personal history of (corrected) congenital malformations of heart and circulatory system: Secondary | ICD-10-CM | POA: Diagnosis not present

## 2015-02-04 DIAGNOSIS — Z8719 Personal history of other diseases of the digestive system: Secondary | ICD-10-CM | POA: Diagnosis not present

## 2015-02-04 DIAGNOSIS — Z8742 Personal history of other diseases of the female genital tract: Secondary | ICD-10-CM | POA: Insufficient documentation

## 2015-02-04 DIAGNOSIS — R1012 Left upper quadrant pain: Secondary | ICD-10-CM

## 2015-02-04 DIAGNOSIS — G43909 Migraine, unspecified, not intractable, without status migrainosus: Secondary | ICD-10-CM | POA: Diagnosis not present

## 2015-02-04 DIAGNOSIS — Z90710 Acquired absence of both cervix and uterus: Secondary | ICD-10-CM | POA: Diagnosis not present

## 2015-02-04 DIAGNOSIS — F419 Anxiety disorder, unspecified: Secondary | ICD-10-CM | POA: Insufficient documentation

## 2015-02-04 DIAGNOSIS — Z9889 Other specified postprocedural states: Secondary | ICD-10-CM | POA: Diagnosis not present

## 2015-02-04 DIAGNOSIS — E785 Hyperlipidemia, unspecified: Secondary | ICD-10-CM | POA: Diagnosis not present

## 2015-02-04 DIAGNOSIS — Z7982 Long term (current) use of aspirin: Secondary | ICD-10-CM | POA: Diagnosis not present

## 2015-02-04 DIAGNOSIS — F329 Major depressive disorder, single episode, unspecified: Secondary | ICD-10-CM | POA: Diagnosis not present

## 2015-02-04 LAB — BASIC METABOLIC PANEL
Anion gap: 6 (ref 5–15)
BUN: 12 mg/dL (ref 6–20)
CO2: 31 mmol/L (ref 22–32)
CREATININE: 0.67 mg/dL (ref 0.44–1.00)
Calcium: 9 mg/dL (ref 8.9–10.3)
Chloride: 101 mmol/L (ref 101–111)
GFR calc Af Amer: >60 mL/min (ref >60)
GLUCOSE: 115 mg/dL — AB (ref 65–99)
POTASSIUM: 4.1 mmol/L (ref 3.5–5.1)
Sodium: 138 mmol/L (ref 135–145)

## 2015-02-04 LAB — CBC WITH DIFFERENTIAL/PLATELET
Basophils Absolute: 0 10*3/uL (ref 0.0–0.1)
Basophils Relative: 1 % (ref 0–1)
EOS ABS: 0.1 10*3/uL (ref 0.0–0.7)
EOS PCT: 2 % (ref 0–5)
HCT: 36.5 % (ref 36.0–46.0)
Hemoglobin: 12.2 g/dL (ref 12.0–15.0)
LYMPHS ABS: 1.5 10*3/uL (ref 0.7–4.0)
LYMPHS PCT: 27 % (ref 12–46)
MCH: 28.4 pg (ref 26.0–34.0)
MCHC: 33.4 g/dL (ref 30.0–36.0)
MCV: 85.1 fL (ref 78.0–100.0)
MONO ABS: 0.5 10*3/uL (ref 0.1–1.0)
MONOS PCT: 9 % (ref 3–12)
Neutro Abs: 3.5 10*3/uL (ref 1.7–7.7)
Neutrophils Relative %: 61 % (ref 43–77)
PLATELETS: 290 10*3/uL (ref 150–400)
RBC: 4.29 MIL/uL (ref 3.87–5.11)
RDW: 13.6 % (ref 11.5–15.5)
WBC: 5.7 10*3/uL (ref 4.0–10.5)

## 2015-02-04 LAB — LIPASE, BLOOD: Lipase: 70 U/L — ABNORMAL HIGH (ref 22–51)

## 2015-02-04 LAB — URINALYSIS, ROUTINE W REFLEX MICROSCOPIC
Bilirubin Urine: NEGATIVE
GLUCOSE, UA: NEGATIVE mg/dL
HGB URINE DIPSTICK: NEGATIVE
KETONES UR: NEGATIVE mg/dL
Nitrite: NEGATIVE
PH: 5.5 (ref 5.0–8.0)
Protein, ur: NEGATIVE mg/dL
Specific Gravity, Urine: <1.005 — ABNORMAL LOW (ref 1.005–1.030)
Urobilinogen, UA: 0.2 mg/dL (ref 0.0–1.0)

## 2015-02-04 LAB — URINE MICROSCOPIC-ADD ON

## 2015-02-04 LAB — HEPATIC FUNCTION PANEL
ALT: 16 U/L (ref 14–54)
AST: 20 U/L (ref 15–41)
Albumin: 4.2 g/dL (ref 3.5–5.0)
Alkaline Phosphatase: 68 U/L (ref 38–126)
BILIRUBIN DIRECT: 0.1 mg/dL (ref 0.1–0.5)
Indirect Bilirubin: 0.2 mg/dL — ABNORMAL LOW (ref 0.3–0.9)
Total Bilirubin: 0.3 mg/dL (ref 0.3–1.2)
Total Protein: 7.8 g/dL (ref 6.5–8.1)

## 2015-02-04 LAB — I-STAT CG4 LACTIC ACID, ED: LACTIC ACID, VENOUS: 1.96 mmol/L (ref 0.5–2.0)

## 2015-02-04 MED ORDER — HYDROMORPHONE HCL 1 MG/ML IJ SOLN
1.0000 mg | Freq: Once | INTRAMUSCULAR | Status: AC
Start: 2015-02-04 — End: 2015-02-04
  Administered 2015-02-04: 1 mg via INTRAVENOUS
  Filled 2015-02-04: qty 1

## 2015-02-04 MED ORDER — IOHEXOL 300 MG/ML  SOLN
100.0000 mL | Freq: Once | INTRAMUSCULAR | Status: AC | PRN
  Administered 2015-02-04: 100 mL via INTRAVENOUS

## 2015-02-04 MED ORDER — PROMETHAZINE HCL 25 MG PO TABS: 25.0000 mg | ORAL_TABLET | Freq: Four times a day (QID) | ORAL | Status: DC | PRN

## 2015-02-04 MED ORDER — ONDANSETRON HCL 4 MG/2ML IJ SOLN
4.0000 mg | Freq: Once | INTRAMUSCULAR | Status: AC
  Administered 2015-02-04: 4 mg via INTRAVENOUS
  Filled 2015-02-04: qty 2

## 2015-02-04 MED ORDER — SODIUM CHLORIDE 0.9 % IV BOLUS (SEPSIS)
1000.0000 mL | Freq: Once | INTRAVENOUS | Status: AC
  Administered 2015-02-04: 1000 mL via INTRAVENOUS
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
## 2015-02-04 NOTE — ED Notes (Signed)
Pt c/o left upper quadrant abd pain with n/v x 4 days. denies diarrhea.

## 2015-02-04 NOTE — Discharge Instructions (Signed)
Abdominal Pain, Women °Abdominal (stomach, pelvic, or belly) pain can be caused by many things. It is important to tell your doctor: °· The location of the pain. °· Does it come and go or is it present all the time? °· Are there things that start the pain (eating certain foods, exercise)? °· Are there other symptoms associated with the pain (fever, nausea, vomiting, diarrhea)? °All of this is helpful to know when trying to find the cause of the pain. °CAUSES  °· Stomach: virus or bacteria infection, or ulcer. °· Intestine: appendicitis (inflamed appendix), regional ileitis (Crohn's disease), ulcerative colitis (inflamed colon), irritable bowel syndrome, diverticulitis (inflamed diverticulum of the colon), or cancer of the stomach or intestine. °· Gallbladder disease or stones in the gallbladder. °· Kidney disease, kidney stones, or infection. °· Pancreas infection or cancer. °· Fibromyalgia (pain disorder). °· Diseases of the female organs: °¨ Uterus: fibroid (non-cancerous) tumors or infection. °¨ Fallopian tubes: infection or tubal pregnancy. °¨ Ovary: cysts or tumors. °¨ Pelvic adhesions (scar tissue). °¨ Endometriosis (uterus lining tissue growing in the pelvis and on the pelvic organs). °¨ Pelvic congestion syndrome (female organs filling up with blood just before the menstrual period). °¨ Pain with the menstrual period. °¨ Pain with ovulation (producing an egg). °¨ Pain with an IUD (intrauterine device, birth control) in the uterus. °¨ Cancer of the female organs. °· Functional pain (pain not caused by a disease, may improve without treatment). °· Psychological pain. °· Depression. °DIAGNOSIS  °Your doctor will decide the seriousness of your pain by doing an examination. °· Blood tests. °· X-rays. °· Ultrasound. °· CT scan (computed tomography, special type of X-ray). °· MRI (magnetic resonance imaging). °· Cultures, for infection. °· Barium enema (dye inserted in the large intestine, to better view it with  X-rays). °· Colonoscopy (looking in intestine with a lighted tube). °· Laparoscopy (minor surgery, looking in abdomen with a lighted tube). °· Major abdominal exploratory surgery (looking in abdomen with a large incision). °TREATMENT  °The treatment will depend on the cause of the pain.  °· Many cases can be observed and treated at home. °· Over-the-counter medicines recommended by your caregiver. °· Prescription medicine. °· Antibiotics, for infection. °· Birth control pills, for painful periods or for ovulation pain. °· Hormone treatment, for endometriosis. °· Nerve blocking injections. °· Physical therapy. °· Antidepressants. °· Counseling with a psychologist or psychiatrist. °· Minor or major surgery. °HOME CARE INSTRUCTIONS  °· Do not take laxatives, unless directed by your caregiver. °· Take over-the-counter pain medicine only if ordered by your caregiver. Do not take aspirin because it can cause an upset stomach or bleeding. °· Try a clear liquid diet (broth or water) as ordered by your caregiver. Slowly move to a bland diet, as tolerated, if the pain is related to the stomach or intestine. °· Have a thermometer and take your temperature several times a day, and record it. °· Bed rest and sleep, if it helps the pain. °· Avoid sexual intercourse, if it causes pain. °· Avoid stressful situations. °· Keep your follow-up appointments and tests, as your caregiver orders. °· If the pain does not go away with medicine or surgery, you may try: °¨ Acupuncture. °¨ Relaxation exercises (yoga, meditation). °¨ Group therapy. °¨ Counseling. °SEEK MEDICAL CARE IF:  °· You notice certain foods cause stomach pain. °· Your home care treatment is not helping your pain. °· You need stronger pain medicine. °· You want your IUD removed. °· You feel faint or   lightheaded. °· You develop nausea and vomiting. °· You develop a rash. °· You are having side effects or an allergy to your medicine. °SEEK IMMEDIATE MEDICAL CARE IF:  °· Your  pain does not go away or gets worse. °· You have a fever. °· Your pain is felt only in portions of the abdomen. The right side could possibly be appendicitis. The left lower portion of the abdomen could be colitis or diverticulitis. °· You are passing blood in your stools (bright red or black tarry stools, with or without vomiting). °· You have blood in your urine. °· You develop chills, with or without a fever. °· You pass out. °MAKE SURE YOU:  °· Understand these instructions. °· Will watch your condition. °· Will get help right away if you are not doing well or get worse. °Document Released: 03/14/2007 Document Revised: 10/01/2013 Document Reviewed: 04/03/2009 °[employer]® Patient Information ©2015 [employer]. This information is not intended to replace advice given to you by your health care provider. Make sure you discuss any questions you have with your health care provider. ° °

## 2015-02-04 NOTE — Progress Notes (Signed)
Called Dr. Gerarda Fraction about order for CT Abdomen and Pelvis, clarified if he wanted a focus pancreas protocol and he stated no. [id number] [id number]

## 2015-02-08 ENCOUNTER — Other Ambulatory Visit: Payer: Self-pay | Admitting: Cardiovascular Disease

## 2015-02-10 NOTE — Telephone Encounter (Signed)
Rx request sent to pharmacy.  

## 2015-02-17 ENCOUNTER — Encounter (INDEPENDENT_AMBULATORY_CARE_PROVIDER_SITE_OTHER): Payer: Self-pay | Admitting: *Deleted

## 2015-02-18 ENCOUNTER — Ambulatory Visit: Payer: Medicare Other | Admitting: Cardiovascular Disease

## 2015-03-17 DIAGNOSIS — R350 Frequency of micturition: Secondary | ICD-10-CM | POA: Diagnosis not present

## 2015-03-17 DIAGNOSIS — R351 Nocturia: Secondary | ICD-10-CM | POA: Diagnosis not present

## 2015-03-17 DIAGNOSIS — N3020 Other chronic cystitis without hematuria: Secondary | ICD-10-CM | POA: Diagnosis not present

## 2015-03-25 ENCOUNTER — Ambulatory Visit (INDEPENDENT_AMBULATORY_CARE_PROVIDER_SITE_OTHER): Payer: Medicare Other | Admitting: Internal Medicine

## 2015-04-02 DIAGNOSIS — R51 Headache: Secondary | ICD-10-CM | POA: Diagnosis not present

## 2015-04-02 DIAGNOSIS — G894 Chronic pain syndrome: Secondary | ICD-10-CM | POA: Diagnosis not present

## 2015-04-02 DIAGNOSIS — M47812 Spondylosis without myelopathy or radiculopathy, cervical region: Secondary | ICD-10-CM | POA: Diagnosis not present

## 2015-04-02 DIAGNOSIS — Z79891 Long term (current) use of opiate analgesic: Secondary | ICD-10-CM | POA: Diagnosis not present

## 2015-04-07 DIAGNOSIS — E1140 Type 2 diabetes mellitus with diabetic neuropathy, unspecified: Secondary | ICD-10-CM | POA: Diagnosis not present

## 2015-04-07 DIAGNOSIS — E119 Type 2 diabetes mellitus without complications: Secondary | ICD-10-CM | POA: Diagnosis not present

## 2015-04-07 DIAGNOSIS — D649 Anemia, unspecified: Secondary | ICD-10-CM | POA: Diagnosis not present

## 2015-04-07 DIAGNOSIS — Z1389 Encounter for screening for other disorder: Secondary | ICD-10-CM | POA: Diagnosis not present

## 2015-04-07 DIAGNOSIS — E559 Vitamin D deficiency, unspecified: Secondary | ICD-10-CM | POA: Diagnosis not present

## 2015-04-07 DIAGNOSIS — E538 Deficiency of other specified B group vitamins: Secondary | ICD-10-CM | POA: Diagnosis not present

## 2015-04-07 DIAGNOSIS — Z6824 Body mass index (BMI) 24.0-24.9, adult: Secondary | ICD-10-CM | POA: Diagnosis not present

## 2015-04-07 DIAGNOSIS — G894 Chronic pain syndrome: Secondary | ICD-10-CM | POA: Diagnosis not present

## 2015-04-07 DIAGNOSIS — R634 Abnormal weight loss: Secondary | ICD-10-CM | POA: Diagnosis not present

## 2015-04-07 DIAGNOSIS — F419 Anxiety disorder, unspecified: Secondary | ICD-10-CM | POA: Diagnosis not present

## 2015-04-07 DIAGNOSIS — I10 Essential (primary) hypertension: Secondary | ICD-10-CM | POA: Diagnosis not present

## 2015-04-11 ENCOUNTER — Other Ambulatory Visit: Payer: Self-pay | Admitting: Cardiovascular Disease

## 2015-05-01 DIAGNOSIS — G894 Chronic pain syndrome: Secondary | ICD-10-CM | POA: Diagnosis not present

## 2015-05-01 DIAGNOSIS — M47812 Spondylosis without myelopathy or radiculopathy, cervical region: Secondary | ICD-10-CM | POA: Diagnosis not present

## 2015-05-01 DIAGNOSIS — R51 Headache: Secondary | ICD-10-CM | POA: Diagnosis not present

## 2015-05-01 DIAGNOSIS — Z79891 Long term (current) use of opiate analgesic: Secondary | ICD-10-CM | POA: Diagnosis not present

## 2015-05-16 ENCOUNTER — Other Ambulatory Visit (HOSPITAL_COMMUNITY): Payer: Self-pay | Admitting: Internal Medicine

## 2015-05-16 DIAGNOSIS — Z1231 Encounter for screening mammogram for malignant neoplasm of breast: Secondary | ICD-10-CM

## 2015-05-21 ENCOUNTER — Ambulatory Visit (HOSPITAL_COMMUNITY)
Admission: RE | Admit: 2015-05-21 | Discharge: 2015-05-21 | Disposition: A | Discharge status: Home / Self Care | Payer: Medicare Other | Source: Ambulatory Visit | Attending: Internal Medicine | Admitting: Internal Medicine

## 2015-05-21 DIAGNOSIS — Z1231 Encounter for screening mammogram for malignant neoplasm of breast: Secondary | ICD-10-CM

## 2015-05-28 DIAGNOSIS — G894 Chronic pain syndrome: Secondary | ICD-10-CM | POA: Diagnosis not present

## 2015-05-28 DIAGNOSIS — M47812 Spondylosis without myelopathy or radiculopathy, cervical region: Secondary | ICD-10-CM | POA: Diagnosis not present

## 2015-05-28 DIAGNOSIS — R51 Headache: Secondary | ICD-10-CM | POA: Diagnosis not present

## 2015-05-28 DIAGNOSIS — Z79891 Long term (current) use of opiate analgesic: Secondary | ICD-10-CM | POA: Diagnosis not present

## 2015-06-25 DIAGNOSIS — Z79891 Long term (current) use of opiate analgesic: Secondary | ICD-10-CM | POA: Diagnosis not present

## 2015-06-25 DIAGNOSIS — G894 Chronic pain syndrome: Secondary | ICD-10-CM | POA: Diagnosis not present

## 2015-06-25 DIAGNOSIS — R51 Headache: Secondary | ICD-10-CM | POA: Diagnosis not present

## 2015-06-25 DIAGNOSIS — M47812 Spondylosis without myelopathy or radiculopathy, cervical region: Secondary | ICD-10-CM | POA: Diagnosis not present

## 2015-07-23 DIAGNOSIS — Z79891 Long term (current) use of opiate analgesic: Secondary | ICD-10-CM | POA: Diagnosis not present

## 2015-07-23 DIAGNOSIS — G894 Chronic pain syndrome: Secondary | ICD-10-CM | POA: Diagnosis not present

## 2015-07-23 DIAGNOSIS — R51 Headache: Secondary | ICD-10-CM | POA: Diagnosis not present

## 2015-07-23 DIAGNOSIS — M47812 Spondylosis without myelopathy or radiculopathy, cervical region: Secondary | ICD-10-CM | POA: Diagnosis not present

## 2015-08-13 ENCOUNTER — Other Ambulatory Visit: Payer: Self-pay | Admitting: Cardiology

## 2015-08-13 NOTE — Telephone Encounter (Signed)
Rx(s) sent to pharmacy electronically.  

## 2015-08-15 DIAGNOSIS — N3020 Other chronic cystitis without hematuria: Secondary | ICD-10-CM | POA: Diagnosis not present

## 2015-08-15 DIAGNOSIS — B9620 Unspecified Escherichia coli [E. coli] as the cause of diseases classified elsewhere: Secondary | ICD-10-CM | POA: Diagnosis not present

## 2015-08-15 DIAGNOSIS — Z0000 Encounter for general adult medical examination without abnormal findings: Secondary | ICD-10-CM | POA: Diagnosis not present

## 2015-08-15 DIAGNOSIS — N390 Urinary tract infection, site not specified: Secondary | ICD-10-CM | POA: Diagnosis not present

## 2015-08-20 DIAGNOSIS — R51 Headache: Secondary | ICD-10-CM | POA: Diagnosis not present

## 2015-08-20 DIAGNOSIS — Z79891 Long term (current) use of opiate analgesic: Secondary | ICD-10-CM | POA: Diagnosis not present

## 2015-08-20 DIAGNOSIS — M47812 Spondylosis without myelopathy or radiculopathy, cervical region: Secondary | ICD-10-CM | POA: Diagnosis not present

## 2015-08-20 DIAGNOSIS — G894 Chronic pain syndrome: Secondary | ICD-10-CM | POA: Diagnosis not present

## 2015-09-15 ENCOUNTER — Other Ambulatory Visit: Payer: Self-pay | Admitting: Cardiovascular Disease

## 2015-09-15 NOTE — Telephone Encounter (Signed)
Rx(s) sent to pharmacy electronically.  

## 2015-09-17 DIAGNOSIS — R51 Headache: Secondary | ICD-10-CM | POA: Diagnosis not present

## 2015-09-17 DIAGNOSIS — G894 Chronic pain syndrome: Secondary | ICD-10-CM | POA: Diagnosis not present

## 2015-09-17 DIAGNOSIS — Z79891 Long term (current) use of opiate analgesic: Secondary | ICD-10-CM | POA: Diagnosis not present

## 2015-09-17 DIAGNOSIS — M47812 Spondylosis without myelopathy or radiculopathy, cervical region: Secondary | ICD-10-CM | POA: Diagnosis not present

## 2015-09-23 DIAGNOSIS — E663 Overweight: Secondary | ICD-10-CM | POA: Diagnosis not present

## 2015-09-23 DIAGNOSIS — Z6825 Body mass index (BMI) 25.0-25.9, adult: Secondary | ICD-10-CM | POA: Diagnosis not present

## 2015-09-23 DIAGNOSIS — R201 Hypoesthesia of skin: Secondary | ICD-10-CM | POA: Diagnosis not present

## 2015-09-23 DIAGNOSIS — E1140 Type 2 diabetes mellitus with diabetic neuropathy, unspecified: Secondary | ICD-10-CM | POA: Diagnosis not present

## 2015-09-23 DIAGNOSIS — E1142 Type 2 diabetes mellitus with diabetic polyneuropathy: Secondary | ICD-10-CM | POA: Diagnosis not present

## 2015-09-23 DIAGNOSIS — Z1389 Encounter for screening for other disorder: Secondary | ICD-10-CM | POA: Diagnosis not present

## 2015-09-23 DIAGNOSIS — G894 Chronic pain syndrome: Secondary | ICD-10-CM | POA: Diagnosis not present

## 2015-09-26 DIAGNOSIS — R350 Frequency of micturition: Secondary | ICD-10-CM | POA: Diagnosis not present

## 2015-09-26 DIAGNOSIS — Z0000 Encounter for general adult medical examination without abnormal findings: Secondary | ICD-10-CM | POA: Diagnosis not present

## 2015-09-26 DIAGNOSIS — N3020 Other chronic cystitis without hematuria: Secondary | ICD-10-CM | POA: Diagnosis not present

## 2015-10-02 ENCOUNTER — Other Ambulatory Visit: Payer: Self-pay | Admitting: Cardiovascular Disease

## 2015-10-02 NOTE — Telephone Encounter (Signed)
Pt need to call the office and schedule a follow up appointment before any future refills will be given [phone]. (2nd attempt)

## 2015-10-14 ENCOUNTER — Other Ambulatory Visit: Payer: Self-pay | Admitting: Cardiovascular Disease

## 2015-10-14 NOTE — Telephone Encounter (Signed)
REFILL 

## 2015-10-15 DIAGNOSIS — G894 Chronic pain syndrome: Secondary | ICD-10-CM | POA: Diagnosis not present

## 2015-10-15 DIAGNOSIS — M47812 Spondylosis without myelopathy or radiculopathy, cervical region: Secondary | ICD-10-CM | POA: Diagnosis not present

## 2015-10-15 DIAGNOSIS — R51 Headache: Secondary | ICD-10-CM | POA: Diagnosis not present

## 2015-10-15 DIAGNOSIS — Z79891 Long term (current) use of opiate analgesic: Secondary | ICD-10-CM | POA: Diagnosis not present

## 2015-10-28 ENCOUNTER — Other Ambulatory Visit: Payer: Self-pay | Admitting: Cardiovascular Disease

## 2015-11-12 DIAGNOSIS — Z79891 Long term (current) use of opiate analgesic: Secondary | ICD-10-CM | POA: Diagnosis not present

## 2015-11-12 DIAGNOSIS — M47812 Spondylosis without myelopathy or radiculopathy, cervical region: Secondary | ICD-10-CM | POA: Diagnosis not present

## 2015-11-12 DIAGNOSIS — G894 Chronic pain syndrome: Secondary | ICD-10-CM | POA: Diagnosis not present

## 2015-11-12 DIAGNOSIS — R51 Headache: Secondary | ICD-10-CM | POA: Diagnosis not present

## 2015-11-27 ENCOUNTER — Other Ambulatory Visit: Payer: Self-pay | Admitting: Cardiovascular Disease

## 2015-11-27 NOTE — Telephone Encounter (Signed)
Rx(s) sent to pharmacy electronically.  

## 2015-12-10 DIAGNOSIS — R51 Headache: Secondary | ICD-10-CM | POA: Diagnosis not present

## 2015-12-10 DIAGNOSIS — G894 Chronic pain syndrome: Secondary | ICD-10-CM | POA: Diagnosis not present

## 2015-12-10 DIAGNOSIS — M47812 Spondylosis without myelopathy or radiculopathy, cervical region: Secondary | ICD-10-CM | POA: Diagnosis not present

## 2015-12-10 DIAGNOSIS — Z79891 Long term (current) use of opiate analgesic: Secondary | ICD-10-CM | POA: Diagnosis not present

## 2015-12-15 ENCOUNTER — Other Ambulatory Visit: Payer: Self-pay | Admitting: Cardiovascular Disease

## 2015-12-15 NOTE — Telephone Encounter (Signed)
Rx(s) sent to pharmacy electronically.  

## 2015-12-18 ENCOUNTER — Other Ambulatory Visit: Payer: Self-pay | Admitting: Cardiovascular Disease

## 2015-12-23 DIAGNOSIS — Z6825 Body mass index (BMI) 25.0-25.9, adult: Secondary | ICD-10-CM | POA: Diagnosis not present

## 2015-12-23 DIAGNOSIS — Z0000 Encounter for general adult medical examination without abnormal findings: Secondary | ICD-10-CM | POA: Diagnosis not present

## 2015-12-23 DIAGNOSIS — E1142 Type 2 diabetes mellitus with diabetic polyneuropathy: Secondary | ICD-10-CM | POA: Diagnosis not present

## 2015-12-23 DIAGNOSIS — I2510 Atherosclerotic heart disease of native coronary artery without angina pectoris: Secondary | ICD-10-CM | POA: Diagnosis not present

## 2015-12-23 DIAGNOSIS — G894 Chronic pain syndrome: Secondary | ICD-10-CM | POA: Diagnosis not present

## 2015-12-23 DIAGNOSIS — I10 Essential (primary) hypertension: Secondary | ICD-10-CM | POA: Diagnosis not present

## 2015-12-31 ENCOUNTER — Ambulatory Visit (INDEPENDENT_AMBULATORY_CARE_PROVIDER_SITE_OTHER): Payer: Medicare Other | Admitting: Cardiology

## 2015-12-31 ENCOUNTER — Encounter: Payer: Self-pay | Admitting: Cardiology

## 2015-12-31 VITALS — BP 98/58 | HR 58 | Ht 65.0 in | Wt 130.8 lb

## 2015-12-31 DIAGNOSIS — Q211 Atrial septal defect, unspecified: Secondary | ICD-10-CM

## 2015-12-31 DIAGNOSIS — R011 Cardiac murmur, unspecified: Secondary | ICD-10-CM

## 2015-12-31 DIAGNOSIS — I2510 Atherosclerotic heart disease of native coronary artery without angina pectoris: Secondary | ICD-10-CM | POA: Diagnosis not present

## 2015-12-31 DIAGNOSIS — R0789 Other chest pain: Secondary | ICD-10-CM

## 2015-12-31 DIAGNOSIS — Z720 Tobacco use: Secondary | ICD-10-CM

## 2015-12-31 MED ORDER — ATORVASTATIN CALCIUM 20 MG PO TABS: 20.0000 mg | ORAL_TABLET | Freq: Every day | ORAL | 11 refills | Status: DC

## 2015-12-31 MED ORDER — ISOSORBIDE MONONITRATE ER 30 MG PO TB24: 30.0000 mg | ORAL_TABLET | Freq: Every day | ORAL | 11 refills | Status: DC

## 2015-12-31 MED ORDER — AMLODIPINE BESYLATE 2.5 MG PO TABS: 2.5000 mg | ORAL_TABLET | Freq: Every day | ORAL | 11 refills | Status: DC

## 2015-12-31 NOTE — Patient Instructions (Addendum)
  Labs:  Your physician recommends that you return for lab work in:2 weeks. OK to have done in [place].    Medications  Decrease Amlodipine to 2.5 mg daily.    Procedures  Your physician has requested that you have an echocardiogram. Echocardiography is a painless test that uses sound waves to create images of your heart. It provides your doctor with information about the size and shape of your heart and how well your heart's chambers and valves are working. This procedure takes approximately one hour. There are no restrictions for this procedure.    Follow-up   Your physician wants you to follow-up in: 1 year with Dr. Bronson Ing in [place]. You will receive a reminder letter in the mail two months in advance. If you don't receive a letter, please call our office to schedule the follow-up appointment.  If you need a refill on your cardiac medications before your next appointment, please call your pharmacy.
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
## 2015-12-31 NOTE — Addendum Note (Signed)
Addended by: Therisa Doyne on: 12/31/2015 12:01 PM   Modules accepted: Orders
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
## 2016-01-08 ENCOUNTER — Other Ambulatory Visit (HOSPITAL_COMMUNITY): Payer: Medicare Other

## 2016-01-12 DIAGNOSIS — R51 Headache: Secondary | ICD-10-CM | POA: Diagnosis not present

## 2016-01-12 DIAGNOSIS — Z79891 Long term (current) use of opiate analgesic: Secondary | ICD-10-CM | POA: Diagnosis not present

## 2016-01-12 DIAGNOSIS — G894 Chronic pain syndrome: Secondary | ICD-10-CM | POA: Diagnosis not present

## 2016-01-12 DIAGNOSIS — M47812 Spondylosis without myelopathy or radiculopathy, cervical region: Secondary | ICD-10-CM | POA: Diagnosis not present

## 2016-01-19 ENCOUNTER — Ambulatory Visit (HOSPITAL_COMMUNITY)
Admission: RE | Admit: 2016-01-19 | Discharge: 2016-01-19 | Disposition: A | Discharge status: Home / Self Care | Payer: Medicare Other | Source: Ambulatory Visit | Attending: Cardiology | Admitting: Cardiology

## 2016-01-19 DIAGNOSIS — E785 Hyperlipidemia, unspecified: Secondary | ICD-10-CM | POA: Diagnosis not present

## 2016-01-19 DIAGNOSIS — E119 Type 2 diabetes mellitus without complications: Secondary | ICD-10-CM | POA: Diagnosis not present

## 2016-01-19 DIAGNOSIS — I2510 Atherosclerotic heart disease of native coronary artery without angina pectoris: Secondary | ICD-10-CM | POA: Insufficient documentation

## 2016-01-19 DIAGNOSIS — I340 Nonrheumatic mitral (valve) insufficiency: Secondary | ICD-10-CM | POA: Diagnosis not present

## 2016-01-19 DIAGNOSIS — I119 Hypertensive heart disease without heart failure: Secondary | ICD-10-CM | POA: Insufficient documentation

## 2016-01-19 DIAGNOSIS — I071 Rheumatic tricuspid insufficiency: Secondary | ICD-10-CM | POA: Insufficient documentation

## 2016-01-19 DIAGNOSIS — R011 Cardiac murmur, unspecified: Secondary | ICD-10-CM | POA: Diagnosis not present

## 2016-01-19 DIAGNOSIS — Z87891 Personal history of nicotine dependence: Secondary | ICD-10-CM | POA: Diagnosis not present

## 2016-01-19 LAB — ECHOCARDIOGRAM COMPLETE
AVLVOTPG: 4 mmHg
E decel time: 246 msec
EERAT: 8.04
FS: 40 % (ref 28–44)
IVS/LV PW RATIO, ED: 0.92
LA ID, A-P, ES: 38 mm
LA vol index: 28.3 mL/m2
LADIAMINDEX: 2.3 cm/m2
LAVOL: 46.8 mL
LAVOLA4C: 51.5 mL
LDCA: 2.54 cm2
LEFT ATRIUM END SYS DIAM: 38 mm
LV E/e'average: 8.04
LV SIMPSON'S DISK: 60
LV TDI E'LATERAL: 9.25
LV TDI E'MEDIAL: 5.44
LV dias vol: 48 mL (ref 46–106)
LV sys vol index: 12 mL/m2
LVDIAVOLIN: 29 mL/m2
LVEEMED: 8.04
LVELAT: 9.25 cm/s
LVOT SV: 66 mL
LVOT VTI: 25.9 cm
LVOT diameter: 18 mm
LVOT peak vel: 106 cm/s
LVSYSVOL: 19 mL (ref 14–42)
MV Dec: 246
MVPG: 2 mmHg
MVPKAVEL: 84 m/s
MVPKEVEL: 74.4 m/s
PW: 11.7 mm — AB (ref 0.6–1.1)
RV LATERAL S' VELOCITY: 11.2 cm/s
Stroke v: 29 ml
TAPSE: 19.2 mm

## 2016-01-19 NOTE — Progress Notes (Signed)
*  PRELIMINARY RESULTS* Echocardiogram 2D Echocardiogram has been performed.  Samuel Germany 01/19/2016, 1:46 PM

## 2016-02-12 DIAGNOSIS — Z79891 Long term (current) use of opiate analgesic: Secondary | ICD-10-CM | POA: Diagnosis not present

## 2016-02-12 DIAGNOSIS — G894 Chronic pain syndrome: Secondary | ICD-10-CM | POA: Diagnosis not present

## 2016-02-12 DIAGNOSIS — R51 Headache: Secondary | ICD-10-CM | POA: Diagnosis not present

## 2016-02-12 DIAGNOSIS — M47812 Spondylosis without myelopathy or radiculopathy, cervical region: Secondary | ICD-10-CM | POA: Diagnosis not present

## 2016-03-09 DIAGNOSIS — Z79891 Long term (current) use of opiate analgesic: Secondary | ICD-10-CM | POA: Diagnosis not present

## 2016-03-09 DIAGNOSIS — R51 Headache: Secondary | ICD-10-CM | POA: Diagnosis not present

## 2016-03-09 DIAGNOSIS — G894 Chronic pain syndrome: Secondary | ICD-10-CM | POA: Diagnosis not present

## 2016-03-09 DIAGNOSIS — M47812 Spondylosis without myelopathy or radiculopathy, cervical region: Secondary | ICD-10-CM | POA: Diagnosis not present

## 2016-04-06 DIAGNOSIS — G894 Chronic pain syndrome: Secondary | ICD-10-CM | POA: Diagnosis not present

## 2016-04-06 DIAGNOSIS — Z79891 Long term (current) use of opiate analgesic: Secondary | ICD-10-CM | POA: Diagnosis not present

## 2016-04-06 DIAGNOSIS — R51 Headache: Secondary | ICD-10-CM | POA: Diagnosis not present

## 2016-04-06 DIAGNOSIS — M47812 Spondylosis without myelopathy or radiculopathy, cervical region: Secondary | ICD-10-CM | POA: Diagnosis not present

## 2016-05-05 DIAGNOSIS — G894 Chronic pain syndrome: Secondary | ICD-10-CM | POA: Diagnosis not present

## 2016-05-05 DIAGNOSIS — Z79891 Long term (current) use of opiate analgesic: Secondary | ICD-10-CM | POA: Diagnosis not present

## 2016-05-05 DIAGNOSIS — R51 Headache: Secondary | ICD-10-CM | POA: Diagnosis not present

## 2016-05-05 DIAGNOSIS — M47812 Spondylosis without myelopathy or radiculopathy, cervical region: Secondary | ICD-10-CM | POA: Diagnosis not present

## 2016-05-14 DIAGNOSIS — E663 Overweight: Secondary | ICD-10-CM | POA: Diagnosis not present

## 2016-05-14 DIAGNOSIS — I10 Essential (primary) hypertension: Secondary | ICD-10-CM | POA: Diagnosis not present

## 2016-05-14 DIAGNOSIS — Z1389 Encounter for screening for other disorder: Secondary | ICD-10-CM | POA: Diagnosis not present

## 2016-05-14 DIAGNOSIS — F419 Anxiety disorder, unspecified: Secondary | ICD-10-CM | POA: Diagnosis not present

## 2016-05-14 DIAGNOSIS — Z6826 Body mass index (BMI) 26.0-26.9, adult: Secondary | ICD-10-CM | POA: Diagnosis not present

## 2016-05-14 DIAGNOSIS — R201 Hypoesthesia of skin: Secondary | ICD-10-CM | POA: Diagnosis not present

## 2016-05-14 DIAGNOSIS — E1140 Type 2 diabetes mellitus with diabetic neuropathy, unspecified: Secondary | ICD-10-CM | POA: Diagnosis not present

## 2016-06-04 DIAGNOSIS — M47812 Spondylosis without myelopathy or radiculopathy, cervical region: Secondary | ICD-10-CM | POA: Diagnosis not present

## 2016-06-04 DIAGNOSIS — R51 Headache: Secondary | ICD-10-CM | POA: Diagnosis not present

## 2016-06-04 DIAGNOSIS — Z79891 Long term (current) use of opiate analgesic: Secondary | ICD-10-CM | POA: Diagnosis not present

## 2016-06-04 DIAGNOSIS — G894 Chronic pain syndrome: Secondary | ICD-10-CM | POA: Diagnosis not present

## 2016-07-06 DIAGNOSIS — R51 Headache: Secondary | ICD-10-CM | POA: Diagnosis not present

## 2016-07-06 DIAGNOSIS — Z79891 Long term (current) use of opiate analgesic: Secondary | ICD-10-CM | POA: Diagnosis not present

## 2016-07-06 DIAGNOSIS — M47812 Spondylosis without myelopathy or radiculopathy, cervical region: Secondary | ICD-10-CM | POA: Diagnosis not present

## 2016-07-06 DIAGNOSIS — G894 Chronic pain syndrome: Secondary | ICD-10-CM | POA: Diagnosis not present

## 2016-09-01 DIAGNOSIS — G894 Chronic pain syndrome: Secondary | ICD-10-CM | POA: Diagnosis not present

## 2016-09-01 DIAGNOSIS — R51 Headache: Secondary | ICD-10-CM | POA: Diagnosis not present

## 2016-09-01 DIAGNOSIS — M47812 Spondylosis without myelopathy or radiculopathy, cervical region: Secondary | ICD-10-CM | POA: Diagnosis not present

## 2016-09-01 DIAGNOSIS — Z79891 Long term (current) use of opiate analgesic: Secondary | ICD-10-CM | POA: Diagnosis not present

## 2016-09-11 DIAGNOSIS — Z79891 Long term (current) use of opiate analgesic: Secondary | ICD-10-CM | POA: Diagnosis not present

## 2016-09-20 DIAGNOSIS — Z1389 Encounter for screening for other disorder: Secondary | ICD-10-CM | POA: Diagnosis not present

## 2016-09-20 DIAGNOSIS — E1143 Type 2 diabetes mellitus with diabetic autonomic (poly)neuropathy: Secondary | ICD-10-CM | POA: Diagnosis not present

## 2016-09-20 DIAGNOSIS — E1140 Type 2 diabetes mellitus with diabetic neuropathy, unspecified: Secondary | ICD-10-CM | POA: Diagnosis not present

## 2016-09-20 DIAGNOSIS — I2510 Atherosclerotic heart disease of native coronary artery without angina pectoris: Secondary | ICD-10-CM | POA: Diagnosis not present

## 2016-09-20 DIAGNOSIS — I10 Essential (primary) hypertension: Secondary | ICD-10-CM | POA: Diagnosis not present

## 2016-09-30 DIAGNOSIS — Z79891 Long term (current) use of opiate analgesic: Secondary | ICD-10-CM | POA: Diagnosis not present

## 2016-09-30 DIAGNOSIS — R51 Headache: Secondary | ICD-10-CM | POA: Diagnosis not present

## 2016-09-30 DIAGNOSIS — G894 Chronic pain syndrome: Secondary | ICD-10-CM | POA: Diagnosis not present

## 2016-09-30 DIAGNOSIS — M47812 Spondylosis without myelopathy or radiculopathy, cervical region: Secondary | ICD-10-CM | POA: Diagnosis not present

## 2016-11-01 DIAGNOSIS — R51 Headache: Secondary | ICD-10-CM | POA: Diagnosis not present

## 2016-11-01 DIAGNOSIS — G894 Chronic pain syndrome: Secondary | ICD-10-CM | POA: Diagnosis not present

## 2016-11-01 DIAGNOSIS — Z79891 Long term (current) use of opiate analgesic: Secondary | ICD-10-CM | POA: Diagnosis not present

## 2016-11-01 DIAGNOSIS — M47812 Spondylosis without myelopathy or radiculopathy, cervical region: Secondary | ICD-10-CM | POA: Diagnosis not present

## 2016-11-30 DIAGNOSIS — R51 Headache: Secondary | ICD-10-CM | POA: Diagnosis not present

## 2016-11-30 DIAGNOSIS — G894 Chronic pain syndrome: Secondary | ICD-10-CM | POA: Diagnosis not present

## 2016-11-30 DIAGNOSIS — M47812 Spondylosis without myelopathy or radiculopathy, cervical region: Secondary | ICD-10-CM | POA: Diagnosis not present

## 2016-11-30 DIAGNOSIS — Z79891 Long term (current) use of opiate analgesic: Secondary | ICD-10-CM | POA: Diagnosis not present

## 2016-12-16 DIAGNOSIS — B353 Tinea pedis: Secondary | ICD-10-CM | POA: Diagnosis not present

## 2016-12-16 DIAGNOSIS — I10 Essential (primary) hypertension: Secondary | ICD-10-CM | POA: Diagnosis not present

## 2016-12-16 DIAGNOSIS — M2550 Pain in unspecified joint: Secondary | ICD-10-CM | POA: Diagnosis not present

## 2016-12-16 DIAGNOSIS — E1140 Type 2 diabetes mellitus with diabetic neuropathy, unspecified: Secondary | ICD-10-CM | POA: Diagnosis not present

## 2016-12-21 ENCOUNTER — Ambulatory Visit (INDEPENDENT_AMBULATORY_CARE_PROVIDER_SITE_OTHER): Payer: Medicare Other

## 2016-12-21 ENCOUNTER — Ambulatory Visit (INDEPENDENT_AMBULATORY_CARE_PROVIDER_SITE_OTHER): Payer: Medicare Other | Admitting: Orthopedic Surgery

## 2016-12-21 ENCOUNTER — Encounter: Payer: Self-pay | Admitting: Orthopedic Surgery

## 2016-12-21 VITALS — BP 130/84 | HR 70 | Ht 65.0 in | Wt 138.0 lb

## 2016-12-21 DIAGNOSIS — M545 Low back pain, unspecified: Secondary | ICD-10-CM

## 2016-12-21 DIAGNOSIS — M25562 Pain in left knee: Secondary | ICD-10-CM

## 2016-12-21 DIAGNOSIS — G8929 Other chronic pain: Secondary | ICD-10-CM | POA: Diagnosis not present

## 2016-12-21 DIAGNOSIS — M1712 Unilateral primary osteoarthritis, left knee: Secondary | ICD-10-CM
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
## 2016-12-29 ENCOUNTER — Other Ambulatory Visit: Payer: Self-pay | Admitting: Anesthesiology

## 2016-12-29 ENCOUNTER — Ambulatory Visit
Admission: RE | Admit: 2016-12-29 | Discharge: 2016-12-29 | Disposition: A | Discharge status: Home / Self Care | Payer: Medicare Other | Source: Ambulatory Visit | Attending: Anesthesiology | Admitting: Anesthesiology

## 2016-12-29 DIAGNOSIS — M79605 Pain in left leg: Secondary | ICD-10-CM

## 2016-12-29 DIAGNOSIS — M25562 Pain in left knee: Secondary | ICD-10-CM | POA: Diagnosis not present

## 2016-12-29 DIAGNOSIS — S3992XA Unspecified injury of lower back, initial encounter: Secondary | ICD-10-CM | POA: Diagnosis not present

## 2016-12-29 DIAGNOSIS — M47812 Spondylosis without myelopathy or radiculopathy, cervical region: Secondary | ICD-10-CM | POA: Diagnosis not present

## 2016-12-29 DIAGNOSIS — G894 Chronic pain syndrome: Secondary | ICD-10-CM | POA: Diagnosis not present

## 2016-12-29 DIAGNOSIS — Z79891 Long term (current) use of opiate analgesic: Secondary | ICD-10-CM | POA: Diagnosis not present

## 2016-12-29 DIAGNOSIS — M545 Low back pain: Secondary | ICD-10-CM | POA: Diagnosis not present

## 2017-01-11 ENCOUNTER — Other Ambulatory Visit: Payer: Self-pay | Admitting: Cardiology

## 2017-01-18 ENCOUNTER — Other Ambulatory Visit: Payer: Self-pay | Admitting: Cardiology

## 2017-01-19 ENCOUNTER — Other Ambulatory Visit: Payer: Self-pay | Admitting: Cardiology

## 2017-01-27 DIAGNOSIS — M25562 Pain in left knee: Secondary | ICD-10-CM | POA: Diagnosis not present

## 2017-01-27 DIAGNOSIS — M47812 Spondylosis without myelopathy or radiculopathy, cervical region: Secondary | ICD-10-CM | POA: Diagnosis not present

## 2017-01-27 DIAGNOSIS — G894 Chronic pain syndrome: Secondary | ICD-10-CM | POA: Diagnosis not present

## 2017-01-27 DIAGNOSIS — Z79891 Long term (current) use of opiate analgesic: Secondary | ICD-10-CM | POA: Diagnosis not present

## 2017-02-08 ENCOUNTER — Other Ambulatory Visit: Payer: Self-pay | Admitting: Cardiology

## 2017-02-08 NOTE — Telephone Encounter (Signed)
REFILL 

## 2017-02-24 DIAGNOSIS — G894 Chronic pain syndrome: Secondary | ICD-10-CM | POA: Diagnosis not present

## 2017-02-24 DIAGNOSIS — M47812 Spondylosis without myelopathy or radiculopathy, cervical region: Secondary | ICD-10-CM | POA: Diagnosis not present

## 2017-02-24 DIAGNOSIS — M25562 Pain in left knee: Secondary | ICD-10-CM | POA: Diagnosis not present

## 2017-02-24 DIAGNOSIS — Z79891 Long term (current) use of opiate analgesic: Secondary | ICD-10-CM | POA: Diagnosis not present

## 2017-03-10 ENCOUNTER — Other Ambulatory Visit: Payer: Self-pay | Admitting: Cardiology

## 2017-03-24 DIAGNOSIS — M47812 Spondylosis without myelopathy or radiculopathy, cervical region: Secondary | ICD-10-CM | POA: Diagnosis not present

## 2017-03-24 DIAGNOSIS — G894 Chronic pain syndrome: Secondary | ICD-10-CM | POA: Diagnosis not present

## 2017-03-24 DIAGNOSIS — Z79891 Long term (current) use of opiate analgesic: Secondary | ICD-10-CM | POA: Diagnosis not present

## 2017-03-24 DIAGNOSIS — M25562 Pain in left knee: Secondary | ICD-10-CM | POA: Diagnosis not present

## 2017-03-29 DIAGNOSIS — R201 Hypoesthesia of skin: Secondary | ICD-10-CM | POA: Diagnosis not present

## 2017-03-29 DIAGNOSIS — E1140 Type 2 diabetes mellitus with diabetic neuropathy, unspecified: Secondary | ICD-10-CM | POA: Diagnosis not present

## 2017-03-29 DIAGNOSIS — B354 Tinea corporis: Secondary | ICD-10-CM | POA: Diagnosis not present

## 2017-03-29 DIAGNOSIS — I10 Essential (primary) hypertension: Secondary | ICD-10-CM | POA: Diagnosis not present

## 2017-04-04 ENCOUNTER — Other Ambulatory Visit: Payer: Self-pay | Admitting: Physical Medicine and Rehabilitation

## 2017-04-04 DIAGNOSIS — M545 Low back pain: Secondary | ICD-10-CM

## 2017-04-08 ENCOUNTER — Telehealth: Payer: Self-pay | Admitting: Cardiovascular Disease

## 2017-04-08 NOTE — Telephone Encounter (Signed)
Pt says she needs her medicine. She can not come for an appointment right now,she is unable to walk.As soon as she is able to walk,she will come in for an appointment.Please call this in before $;00,so they can deliver it to her please.,She is not even able to go pick up her medicine.t.

## 2017-04-11 NOTE — Telephone Encounter (Signed)
Spoke with pt who states that she is unsure if she would like to switch offices to [place]. Pt reports that she has seen Dr. Claiborne Billings since she was 56 yrs old. Explained to pt that Dr. Claiborne Billings does not come to the [facility] office but that she can be seen by Dr. Bronson Ing in [place]. Explained to pt that Rx refill can not be completed until she is seen by a provider. Pt voiced that she would like to think it over and call the office back.

## 2017-04-20 ENCOUNTER — Ambulatory Visit
Admission: RE | Admit: 2017-04-20 | Discharge: 2017-04-20 | Disposition: A | Discharge status: Home / Self Care | Payer: Medicare Other | Source: Ambulatory Visit | Attending: Physical Medicine and Rehabilitation | Admitting: Physical Medicine and Rehabilitation

## 2017-04-20 DIAGNOSIS — M25562 Pain in left knee: Secondary | ICD-10-CM | POA: Diagnosis not present

## 2017-04-20 DIAGNOSIS — M5126 Other intervertebral disc displacement, lumbar region: Secondary | ICD-10-CM | POA: Diagnosis not present

## 2017-04-20 DIAGNOSIS — M545 Low back pain: Secondary | ICD-10-CM

## 2017-04-20 DIAGNOSIS — Z79891 Long term (current) use of opiate analgesic: Secondary | ICD-10-CM | POA: Diagnosis not present

## 2017-04-20 DIAGNOSIS — G894 Chronic pain syndrome: Secondary | ICD-10-CM | POA: Diagnosis not present

## 2017-04-20 DIAGNOSIS — M47812 Spondylosis without myelopathy or radiculopathy, cervical region: Secondary | ICD-10-CM | POA: Diagnosis not present

## 2017-04-27 ENCOUNTER — Encounter: Payer: Self-pay | Admitting: Neurology

## 2017-04-27 ENCOUNTER — Ambulatory Visit: Payer: Medicare Other | Admitting: Neurology

## 2017-04-27 ENCOUNTER — Other Ambulatory Visit: Payer: Self-pay

## 2017-04-27 VITALS — BP 146/69 | HR 65 | Ht 65.0 in | Wt 145.0 lb

## 2017-04-27 DIAGNOSIS — G8929 Other chronic pain: Secondary | ICD-10-CM | POA: Diagnosis not present

## 2017-04-27 DIAGNOSIS — M5442 Lumbago with sciatica, left side: Secondary | ICD-10-CM | POA: Diagnosis not present

## 2017-04-27 NOTE — Patient Instructions (Signed)
   We will get EMG and NCV evaluation to look at the nerve function of the legs. 
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
## 2017-05-03 ENCOUNTER — Encounter: Payer: Medicare Other | Admitting: Neurology

## 2017-05-03 ENCOUNTER — Ambulatory Visit (INDEPENDENT_AMBULATORY_CARE_PROVIDER_SITE_OTHER): Payer: Medicare Other | Admitting: Neurology

## 2017-05-03 ENCOUNTER — Ambulatory Visit: Payer: Medicare Other | Admitting: Neurology

## 2017-05-03 ENCOUNTER — Encounter: Payer: Self-pay | Admitting: Neurology

## 2017-05-03 DIAGNOSIS — G8929 Other chronic pain: Secondary | ICD-10-CM

## 2017-05-03 DIAGNOSIS — M5442 Lumbago with sciatica, left side: Secondary | ICD-10-CM

## 2017-05-03 NOTE — Progress Notes (Addendum)
EMG and nerve conduction study evaluation was done today showing no evidence of a neuropathy in the lower extremities, EMG of the left leg is unremarkable, no evidence of a lumbosacral radiculopathy is seen.  The patient is having a lot of discomfort in the left knee, she is unable to straighten in the out, she cannot bear weight well on the left leg because of knee pain.  The patient may go back to her orthopedic surgeon, Dr. Aline Brochure.  I have no evidence that her pain syndrome is related to a nerve injury.  MRI of the low back showed minor disc bulges, no evidence of nerve root compression.    [place]    Nerve / Sites Muscle Latency Ref. Amplitude Ref. Rel Amp Segments Distance Velocity Ref. Area    ms ms mV mV %  cm m/s m/s mVms  L Peroneal - EDB     Ankle EDB 3.6 ?6.5 6.3 ?2.0 100 Ankle - EDB 9   18.2     Fib head EDB 10.4  6.3  98.8 Fib head - Ankle 35 52 ?44 16.9     Pop fossa EDB 12.2  6.1  97.8 Pop fossa - Fib head 10 56 ?44 16.5         Pop fossa - Ankle      R Peroneal - EDB     Ankle EDB 4.7 ?6.5 5.5 ?2.0 100 Ankle - EDB 9   13.8     Fib head EDB 11.5  3.8  68.9 Fib head - Ankle 37 55 ?44 10.6     Pop fossa EDB 13.2  4.5  119 Pop fossa - Fib head 10 58 ?44 29.3         Pop fossa - Ankle      L Tibial - AH     Ankle AH 6.1 ?5.8 9.6 ?4.0 100 Ankle - AH 9   23.9     Pop fossa AH 15.7  4.2  43.7 Pop fossa - Ankle 43 45 ?41 12.9  R Tibial - AH     Ankle AH 6.0 ?5.8 12.6 ?4.0 100 Ankle - AH 9   28.8     Pop fossa AH 15.6  10.7  84.8 Pop fossa - Ankle 44 46 ?41 28.6             SNC    Nerve / Sites Rec. Site Peak Lat Ref.  Amp Ref. Segments Distance    ms ms V V  cm  R Superficial peroneal - Ankle     Lat leg Ankle 2.8 ?4.4 48 ?6 Lat leg - Ankle 14  L Superficial peroneal - Ankle     Lat leg Ankle 2.9 ?4.4 37 ?6 Lat leg - Ankle 14         H Reflex    Nerve H Lat Lat Hmax   ms ms   Left Right Ref. Left Right Ref.  Tibial - [place] 31.9 31.4 ?35.0 34.5 31.4 ?35.0

## 2017-05-03 NOTE — Progress Notes (Signed)
Please refer to EMG and nerve conduction study procedure note. 

## 2017-05-03 NOTE — Procedures (Signed)
     HISTORY:  Anita [name] is a 56 year old patient with a history of some leg pain primarily on the left from the knee level down to the ankle.  The patient has some slight low back pain as well.  She is being evaluated for a possible neuropathy or a lumbosacral radiculopathy.  She does have a history of diabetes.  NERVE CONDUCTION STUDIES:  Nerve conduction studies were performed on both lower extremities.  The distal motor latencies for the peroneal nerves were within normal limits bilaterally with normal motor amplitudes seen for these nerves bilaterally.  Distal motor latencies for the posterior tibial nerves were slightly prolonged bilaterally with normal motor amplitudes seen for these nerves bilaterally.  Nerve conduction velocities for the peroneal and posterior tibial nerves were normal bilaterally.  The sensory latencies for the peroneal nerves were normal bilaterally.  The H reflex latencies were within normal limits bilaterally.  EMG STUDIES:  EMG study was performed on the left lower extremity:  The tibialis anterior muscle reveals 2 to 4K motor units with full recruitment. No fibrillations or positive waves were seen. The peroneus tertius muscle reveals 2 to 4K motor units with full recruitment. No fibrillations or positive waves were seen. The medial gastrocnemius muscle reveals 1 to 3K motor units with full recruitment. No fibrillations or positive waves were seen. The vastus lateralis muscle reveals 2 to 4K motor units with full recruitment. No fibrillations or positive waves were seen. The iliopsoas muscle reveals 2 to 4K motor units with full recruitment. No fibrillations or positive waves were seen. The biceps femoris muscle (long head) reveals 2 to 4K motor units with full recruitment. No fibrillations or positive waves were seen. The lumbosacral paraspinal muscles were tested at 3 levels, and revealed no abnormalities of insertional activity at all 3 levels tested. There  was good relaxation.   IMPRESSION:  Nerve conduction studies done on both lower extremities were relatively unremarkable without evidence of a peripheral neuropathy.  EMG evaluation of the left lower extremity was unremarkable without evidence of an overlying lumbosacral radiculopathy.  Jill Alexanders MD 05/03/2017 3:47 PM  [facility] [address] [place], [place] [address]  Phone [phone] Fax [phone]

## 2017-05-11 ENCOUNTER — Other Ambulatory Visit: Payer: Self-pay | Admitting: Cardiovascular Disease

## 2017-05-11 NOTE — Telephone Encounter (Signed)
REFILL 

## 2017-05-16 DIAGNOSIS — M25562 Pain in left knee: Secondary | ICD-10-CM | POA: Diagnosis not present

## 2017-05-16 DIAGNOSIS — G8929 Other chronic pain: Secondary | ICD-10-CM | POA: Diagnosis not present

## 2017-06-14 ENCOUNTER — Other Ambulatory Visit: Payer: Self-pay | Admitting: Cardiovascular Disease

## 2017-06-15 DIAGNOSIS — Z79891 Long term (current) use of opiate analgesic: Secondary | ICD-10-CM | POA: Diagnosis not present

## 2017-06-15 DIAGNOSIS — M25562 Pain in left knee: Secondary | ICD-10-CM | POA: Diagnosis not present

## 2017-06-15 DIAGNOSIS — M47812 Spondylosis without myelopathy or radiculopathy, cervical region: Secondary | ICD-10-CM | POA: Diagnosis not present

## 2017-06-15 DIAGNOSIS — G894 Chronic pain syndrome: Secondary | ICD-10-CM | POA: Diagnosis not present

## 2017-07-04 DIAGNOSIS — I2510 Atherosclerotic heart disease of native coronary artery without angina pectoris: Secondary | ICD-10-CM | POA: Diagnosis not present

## 2017-07-04 DIAGNOSIS — Z1389 Encounter for screening for other disorder: Secondary | ICD-10-CM | POA: Diagnosis not present

## 2017-07-04 DIAGNOSIS — M1712 Unilateral primary osteoarthritis, left knee: Secondary | ICD-10-CM | POA: Diagnosis not present

## 2017-07-04 DIAGNOSIS — E1140 Type 2 diabetes mellitus with diabetic neuropathy, unspecified: Secondary | ICD-10-CM | POA: Diagnosis not present

## 2017-07-04 DIAGNOSIS — R201 Hypoesthesia of skin: Secondary | ICD-10-CM | POA: Diagnosis not present

## 2017-07-04 DIAGNOSIS — I10 Essential (primary) hypertension: Secondary | ICD-10-CM | POA: Diagnosis not present

## 2017-07-13 DIAGNOSIS — G894 Chronic pain syndrome: Secondary | ICD-10-CM | POA: Diagnosis not present

## 2017-07-13 DIAGNOSIS — N3020 Other chronic cystitis without hematuria: Secondary | ICD-10-CM | POA: Diagnosis not present

## 2017-07-13 DIAGNOSIS — M47812 Spondylosis without myelopathy or radiculopathy, cervical region: Secondary | ICD-10-CM | POA: Diagnosis not present

## 2017-07-13 DIAGNOSIS — N390 Urinary tract infection, site not specified: Secondary | ICD-10-CM | POA: Diagnosis not present

## 2017-07-13 DIAGNOSIS — Z79891 Long term (current) use of opiate analgesic: Secondary | ICD-10-CM | POA: Diagnosis not present

## 2017-07-13 DIAGNOSIS — M25562 Pain in left knee: Secondary | ICD-10-CM | POA: Diagnosis not present

## 2017-07-28 DIAGNOSIS — R310 Gross hematuria: Secondary | ICD-10-CM | POA: Diagnosis not present

## 2017-07-28 DIAGNOSIS — N200 Calculus of kidney: Secondary | ICD-10-CM | POA: Diagnosis not present

## 2017-08-09 DIAGNOSIS — R319 Hematuria, unspecified: Secondary | ICD-10-CM | POA: Diagnosis not present

## 2017-08-09 DIAGNOSIS — N390 Urinary tract infection, site not specified: Secondary | ICD-10-CM | POA: Diagnosis not present

## 2017-08-17 DIAGNOSIS — Z79891 Long term (current) use of opiate analgesic: Secondary | ICD-10-CM | POA: Diagnosis not present

## 2017-08-17 DIAGNOSIS — G894 Chronic pain syndrome: Secondary | ICD-10-CM | POA: Diagnosis not present

## 2017-08-17 DIAGNOSIS — M25562 Pain in left knee: Secondary | ICD-10-CM | POA: Diagnosis not present

## 2017-08-17 DIAGNOSIS — M47812 Spondylosis without myelopathy or radiculopathy, cervical region: Secondary | ICD-10-CM | POA: Diagnosis not present

## 2017-09-14 DIAGNOSIS — M25562 Pain in left knee: Secondary | ICD-10-CM | POA: Diagnosis not present

## 2017-09-14 DIAGNOSIS — Z79891 Long term (current) use of opiate analgesic: Secondary | ICD-10-CM | POA: Diagnosis not present

## 2017-09-14 DIAGNOSIS — M47812 Spondylosis without myelopathy or radiculopathy, cervical region: Secondary | ICD-10-CM | POA: Diagnosis not present

## 2017-09-14 DIAGNOSIS — G894 Chronic pain syndrome: Secondary | ICD-10-CM | POA: Diagnosis not present

## 2017-10-12 DIAGNOSIS — M47812 Spondylosis without myelopathy or radiculopathy, cervical region: Secondary | ICD-10-CM | POA: Diagnosis not present

## 2017-10-12 DIAGNOSIS — M25562 Pain in left knee: Secondary | ICD-10-CM | POA: Diagnosis not present

## 2017-10-12 DIAGNOSIS — G894 Chronic pain syndrome: Secondary | ICD-10-CM | POA: Diagnosis not present

## 2017-10-12 DIAGNOSIS — Z79891 Long term (current) use of opiate analgesic: Secondary | ICD-10-CM | POA: Diagnosis not present

## 2017-11-10 DIAGNOSIS — M25562 Pain in left knee: Secondary | ICD-10-CM | POA: Diagnosis not present

## 2017-11-10 DIAGNOSIS — G894 Chronic pain syndrome: Secondary | ICD-10-CM | POA: Diagnosis not present

## 2017-11-10 DIAGNOSIS — M47812 Spondylosis without myelopathy or radiculopathy, cervical region: Secondary | ICD-10-CM | POA: Diagnosis not present

## 2017-11-10 DIAGNOSIS — Z79891 Long term (current) use of opiate analgesic: Secondary | ICD-10-CM | POA: Diagnosis not present

## 2017-12-08 DIAGNOSIS — M47812 Spondylosis without myelopathy or radiculopathy, cervical region: Secondary | ICD-10-CM | POA: Diagnosis not present

## 2017-12-08 DIAGNOSIS — Z79891 Long term (current) use of opiate analgesic: Secondary | ICD-10-CM | POA: Diagnosis not present

## 2017-12-08 DIAGNOSIS — G894 Chronic pain syndrome: Secondary | ICD-10-CM | POA: Diagnosis not present

## 2017-12-08 DIAGNOSIS — M25562 Pain in left knee: Secondary | ICD-10-CM | POA: Diagnosis not present

## 2018-01-10 DIAGNOSIS — M47812 Spondylosis without myelopathy or radiculopathy, cervical region: Secondary | ICD-10-CM | POA: Diagnosis not present

## 2018-01-10 DIAGNOSIS — G894 Chronic pain syndrome: Secondary | ICD-10-CM | POA: Diagnosis not present

## 2018-01-10 DIAGNOSIS — Z79891 Long term (current) use of opiate analgesic: Secondary | ICD-10-CM | POA: Diagnosis not present

## 2018-01-10 DIAGNOSIS — M25562 Pain in left knee: Secondary | ICD-10-CM | POA: Diagnosis not present

## 2018-01-20 DIAGNOSIS — I10 Essential (primary) hypertension: Secondary | ICD-10-CM | POA: Diagnosis not present

## 2018-01-20 DIAGNOSIS — Z0001 Encounter for general adult medical examination with abnormal findings: Secondary | ICD-10-CM | POA: Diagnosis not present

## 2018-01-20 DIAGNOSIS — E1140 Type 2 diabetes mellitus with diabetic neuropathy, unspecified: Secondary | ICD-10-CM | POA: Diagnosis not present

## 2018-01-20 DIAGNOSIS — M1991 Primary osteoarthritis, unspecified site: Secondary | ICD-10-CM | POA: Diagnosis not present

## 2018-01-20 DIAGNOSIS — Z1389 Encounter for screening for other disorder: Secondary | ICD-10-CM | POA: Diagnosis not present

## 2018-01-20 DIAGNOSIS — G894 Chronic pain syndrome: Secondary | ICD-10-CM | POA: Diagnosis not present

## 2018-01-23 DIAGNOSIS — E1140 Type 2 diabetes mellitus with diabetic neuropathy, unspecified: Secondary | ICD-10-CM | POA: Diagnosis not present

## 2018-01-23 DIAGNOSIS — Z1389 Encounter for screening for other disorder: Secondary | ICD-10-CM | POA: Diagnosis not present

## 2018-01-23 DIAGNOSIS — E119 Type 2 diabetes mellitus without complications: Secondary | ICD-10-CM | POA: Diagnosis not present

## 2018-02-08 DIAGNOSIS — G894 Chronic pain syndrome: Secondary | ICD-10-CM | POA: Diagnosis not present

## 2018-02-08 DIAGNOSIS — M25562 Pain in left knee: Secondary | ICD-10-CM | POA: Diagnosis not present

## 2018-02-08 DIAGNOSIS — M47812 Spondylosis without myelopathy or radiculopathy, cervical region: Secondary | ICD-10-CM | POA: Diagnosis not present

## 2018-02-08 DIAGNOSIS — Z79891 Long term (current) use of opiate analgesic: Secondary | ICD-10-CM | POA: Diagnosis not present

## 2018-03-09 DIAGNOSIS — Z79891 Long term (current) use of opiate analgesic: Secondary | ICD-10-CM | POA: Diagnosis not present

## 2018-03-09 DIAGNOSIS — M25562 Pain in left knee: Secondary | ICD-10-CM | POA: Diagnosis not present

## 2018-03-09 DIAGNOSIS — G894 Chronic pain syndrome: Secondary | ICD-10-CM | POA: Diagnosis not present

## 2018-03-09 DIAGNOSIS — M47812 Spondylosis without myelopathy or radiculopathy, cervical region: Secondary | ICD-10-CM | POA: Diagnosis not present

## 2018-04-06 ENCOUNTER — Ambulatory Visit
Admission: RE | Admit: 2018-04-06 | Discharge: 2018-04-06 | Disposition: A | Discharge status: Home / Self Care | Payer: Medicare Other | Source: Ambulatory Visit | Attending: Anesthesiology | Admitting: Anesthesiology

## 2018-04-06 ENCOUNTER — Other Ambulatory Visit: Payer: Self-pay | Admitting: Anesthesiology

## 2018-04-06 DIAGNOSIS — M47812 Spondylosis without myelopathy or radiculopathy, cervical region: Secondary | ICD-10-CM | POA: Diagnosis not present

## 2018-04-06 DIAGNOSIS — M25562 Pain in left knee: Secondary | ICD-10-CM

## 2018-04-06 DIAGNOSIS — Z79891 Long term (current) use of opiate analgesic: Secondary | ICD-10-CM | POA: Diagnosis not present

## 2018-04-06 DIAGNOSIS — M1712 Unilateral primary osteoarthritis, left knee: Secondary | ICD-10-CM | POA: Diagnosis not present

## 2018-04-06 DIAGNOSIS — G894 Chronic pain syndrome: Secondary | ICD-10-CM | POA: Diagnosis not present

## 2018-04-17 ENCOUNTER — Other Ambulatory Visit: Payer: Self-pay

## 2018-04-17 NOTE — Patient Outreach (Signed)
[facility]) Care Management  04/17/2018  Anita [name] 11-06-1960 [id number]   Medication Adherence call to Anita [name] spoke with patient she is no longer taking Atorvastatin 20 mg doctor took her off. Anita [name] is showing past due under [place].   [place] Direct Dial [phone]  Fax [phone] [name]

## 2018-05-09 DIAGNOSIS — M25562 Pain in left knee: Secondary | ICD-10-CM | POA: Diagnosis not present

## 2018-05-09 DIAGNOSIS — M47812 Spondylosis without myelopathy or radiculopathy, cervical region: Secondary | ICD-10-CM | POA: Diagnosis not present

## 2018-05-09 DIAGNOSIS — G894 Chronic pain syndrome: Secondary | ICD-10-CM | POA: Diagnosis not present

## 2018-05-09 DIAGNOSIS — Z79891 Long term (current) use of opiate analgesic: Secondary | ICD-10-CM | POA: Diagnosis not present

## 2018-05-16 DIAGNOSIS — R079 Chest pain, unspecified: Secondary | ICD-10-CM | POA: Diagnosis not present

## 2018-05-16 DIAGNOSIS — E119 Type 2 diabetes mellitus without complications: Secondary | ICD-10-CM | POA: Diagnosis not present

## 2018-05-16 DIAGNOSIS — G894 Chronic pain syndrome: Secondary | ICD-10-CM | POA: Diagnosis not present

## 2018-05-16 DIAGNOSIS — J329 Chronic sinusitis, unspecified: Secondary | ICD-10-CM | POA: Diagnosis not present

## 2018-05-16 DIAGNOSIS — I10 Essential (primary) hypertension: Secondary | ICD-10-CM | POA: Diagnosis not present

## 2018-05-16 DIAGNOSIS — M2550 Pain in unspecified joint: Secondary | ICD-10-CM | POA: Diagnosis not present

## 2018-05-16 DIAGNOSIS — Z1389 Encounter for screening for other disorder: Secondary | ICD-10-CM | POA: Diagnosis not present

## 2018-05-17 ENCOUNTER — Other Ambulatory Visit: Payer: Self-pay | Admitting: Anesthesiology

## 2018-05-17 DIAGNOSIS — M5410 Radiculopathy, site unspecified: Secondary | ICD-10-CM | POA: Diagnosis not present

## 2018-05-17 DIAGNOSIS — M25562 Pain in left knee: Secondary | ICD-10-CM | POA: Diagnosis not present

## 2018-05-17 DIAGNOSIS — G8929 Other chronic pain: Secondary | ICD-10-CM | POA: Diagnosis not present

## 2018-05-29 DIAGNOSIS — Z981 Arthrodesis status: Secondary | ICD-10-CM | POA: Diagnosis not present

## 2018-05-29 DIAGNOSIS — M5410 Radiculopathy, site unspecified: Secondary | ICD-10-CM | POA: Diagnosis not present

## 2018-06-08 ENCOUNTER — Telehealth: Payer: Self-pay | Admitting: Neurology

## 2018-06-08 ENCOUNTER — Ambulatory Visit: Payer: Medicare Other | Admitting: Neurology

## 2018-06-08 ENCOUNTER — Encounter: Payer: Self-pay | Admitting: Neurology

## 2018-06-08 VITALS — BP 128/62 | HR 65 | Ht 65.0 in | Wt 137.0 lb

## 2018-06-08 DIAGNOSIS — M25562 Pain in left knee: Secondary | ICD-10-CM | POA: Diagnosis not present

## 2018-06-08 DIAGNOSIS — M47812 Spondylosis without myelopathy or radiculopathy, cervical region: Secondary | ICD-10-CM | POA: Diagnosis not present

## 2018-06-08 DIAGNOSIS — G8929 Other chronic pain: Secondary | ICD-10-CM | POA: Diagnosis not present

## 2018-06-08 DIAGNOSIS — Z79891 Long term (current) use of opiate analgesic: Secondary | ICD-10-CM | POA: Diagnosis not present

## 2018-06-08 DIAGNOSIS — M79605 Pain in left leg: Secondary | ICD-10-CM | POA: Diagnosis not present

## 2018-06-08 DIAGNOSIS — M5442 Lumbago with sciatica, left side: Secondary | ICD-10-CM | POA: Diagnosis not present

## 2018-06-08 DIAGNOSIS — G894 Chronic pain syndrome: Secondary | ICD-10-CM | POA: Diagnosis not present

## 2018-06-08 DIAGNOSIS — R202 Paresthesia of skin: Secondary | ICD-10-CM | POA: Diagnosis not present

## 2018-06-08 MED ORDER — CARBAMAZEPINE 200 MG PO TABS: ORAL_TABLET | ORAL | 3 refills | Status: DC

## 2018-06-08 NOTE — Telephone Encounter (Signed)
[employer] medicare order sent to GI. No auth they will reach out to the pt to schedule.  °

## 2018-06-08 NOTE — Patient Instructions (Signed)
We will start Carbamazepine for the leg pain.   Tegretol (carbamazepine) may result in dizziness, gait instability, cognitive slowing, or drowsiness. Sometimes, and allergic rash may occur, or a photosensitive rash may occur. If any significant side effects are noted, please contact our office.
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
## 2018-06-09 ENCOUNTER — Telehealth: Payer: Self-pay | Admitting: Neurology

## 2018-06-09 LAB — COMPREHENSIVE METABOLIC PANEL
ALK PHOS: 71 IU/L (ref 39–117)
ALT: 22 IU/L (ref 0–32)
AST: 19 IU/L (ref 0–40)
Albumin/Globulin Ratio: 1.7 (ref 1.2–2.2)
Albumin: 4.5 g/dL (ref 3.5–5.5)
BILIRUBIN TOTAL: 0.2 mg/dL (ref 0.0–1.2)
BUN/Creatinine Ratio: 21 (ref 9–23)
BUN: 19 mg/dL (ref 6–24)
CHLORIDE: 99 mmol/L (ref 96–106)
CO2: 25 mmol/L (ref 20–29)
CREATININE: 0.9 mg/dL (ref 0.57–1.00)
Calcium: 9.8 mg/dL (ref 8.7–10.2)
GFR calc Af Amer: 82 mL/min/{1.73_m2} (ref >59)
GFR calc non Af Amer: 71 mL/min/{1.73_m2} (ref >59)
GLUCOSE: 199 mg/dL — AB (ref 65–99)
Globulin, Total: 2.6 g/dL (ref 1.5–4.5)
Potassium: 5.1 mmol/L (ref 3.5–5.2)
Sodium: 140 mmol/L (ref 134–144)
Total Protein: 7.1 g/dL (ref 6.0–8.5)

## 2018-06-09 LAB — RPR: RPR Ser Ql: NONREACTIVE

## 2018-06-09 LAB — HIV ANTIBODY (ROUTINE TESTING W REFLEX): HIV SCREEN 4TH GENERATION: NONREACTIVE

## 2018-06-09 LAB — ANA W/REFLEX: Anti Nuclear Antibody(ANA): NEGATIVE

## 2018-06-09 LAB — RHEUMATOID FACTOR: Rheumatoid fact SerPl-aCnc: <10 IU/mL (ref 0.0–13.9)

## 2018-06-09 LAB — VITAMIN B12: VITAMIN B 12: 190 pg/mL — AB (ref 232–1245)

## 2018-06-09 LAB — SEDIMENTATION RATE: SED RATE: 13 mm/h (ref 0–40)

## 2018-06-09 LAB — B. BURGDORFI ANTIBODIES: Lyme IgG/IgM Ab: <0.91 {ISR} (ref 0.00–0.90)

## 2018-06-09 LAB — ANGIOTENSIN CONVERTING ENZYME: Angio Convert Enzyme: 23 U/L (ref 14–82)

## 2018-06-09 NOTE — Telephone Encounter (Signed)
I called the patient.  The blood work shows an elevated blood sugar of around 200, the vitamin B12 level is low.  Otherwise blood work is unremarkable.  The patient will start vitamin B12 tablets 1000 mcg daily, I will have her come in next week for a B12 injection.

## 2018-06-12 NOTE — Telephone Encounter (Signed)
Patient is aware 

## 2018-06-12 NOTE — Telephone Encounter (Signed)
I contacted the pt to schedule b 12 injection. Pt stated to me she is going to discuss b 12 injection with PCP and see if he would be agreeable with her receiving the injection at his office.  Pt states she has a 30 minute drive to the office and her PCP is located closer to her home. I advised she could discuss this with PCP, but if she was unable to receive the injection to please call our office back so we could schedule for her. She was agreeable with this recommendation and had no further questions/concerns at this time.

## 2018-07-06 DIAGNOSIS — G894 Chronic pain syndrome: Secondary | ICD-10-CM | POA: Diagnosis not present

## 2018-07-06 DIAGNOSIS — M25562 Pain in left knee: Secondary | ICD-10-CM | POA: Diagnosis not present

## 2018-07-06 DIAGNOSIS — Z79891 Long term (current) use of opiate analgesic: Secondary | ICD-10-CM | POA: Diagnosis not present

## 2018-07-06 DIAGNOSIS — M47812 Spondylosis without myelopathy or radiculopathy, cervical region: Secondary | ICD-10-CM | POA: Diagnosis not present

## 2018-07-31 ENCOUNTER — Ambulatory Visit: Payer: Medicare Other | Admitting: Cardiovascular Disease

## 2018-07-31 ENCOUNTER — Encounter: Payer: Self-pay | Admitting: Cardiovascular Disease

## 2018-07-31 ENCOUNTER — Encounter (INDEPENDENT_AMBULATORY_CARE_PROVIDER_SITE_OTHER): Payer: Self-pay

## 2018-07-31 VITALS — BP 150/70 | HR 66 | Ht 64.0 in | Wt 134.8 lb

## 2018-07-31 DIAGNOSIS — G894 Chronic pain syndrome: Secondary | ICD-10-CM

## 2018-07-31 DIAGNOSIS — R002 Palpitations: Secondary | ICD-10-CM

## 2018-07-31 DIAGNOSIS — Z8774 Personal history of (corrected) congenital malformations of heart and circulatory system: Secondary | ICD-10-CM | POA: Diagnosis not present

## 2018-07-31 DIAGNOSIS — E785 Hyperlipidemia, unspecified: Secondary | ICD-10-CM | POA: Diagnosis not present

## 2018-07-31 DIAGNOSIS — F419 Anxiety disorder, unspecified: Secondary | ICD-10-CM

## 2018-07-31 NOTE — Patient Instructions (Signed)
Medication Instructions:  Can take an extra 25 mg - 50 mg of Metoprolol if you have palpitations as needed.  If you need a refill on your cardiac medications before your next appointment, please call your pharmacy.   Lab work: Fasting blood work (CMET, CBC, TSH, LIPID, MAG, A1C) If you have labs (blood work) drawn today and your tests are completely normal, you will receive your results only by: Marland Kitchen MyChart Message (if you have MyChart) OR . A paper copy in the mail If you have any lab test that is abnormal or we need to change your treatment, we will call you to review the results.  Testing/Procedures: Echocardiogram - Your physician has requested that you have an echocardiogram. Echocardiography is a painless test that uses sound waves to create images of your heart. It provides your doctor with information about the size and shape of your heart and how well your heart's chambers and valves are working. This procedure takes approximately one hour. There are no restrictions for this procedure. This will be performed at our [facility] location - [address].  Your physician has recommended that you wear a 30 day event monitor. Event monitors are medical devices that record the heart's electrical activity. Doctors most often [place] these monitors to diagnose arrhythmias. Arrhythmias are problems with the speed or rhythm of the heartbeat. The monitor is a small, portable device. You can wear one while you do your normal daily activities. This is usually used to diagnose what is causing palpitations/syncope (passing out).    Follow-Up: At [facility], you and your health needs are our priority.  As part of our continuing mission to provide you with exceptional heart care, we have created designated Provider Care Teams.  These Care Teams include your primary Cardiologist (physician) and Advanced Practice Providers (APPs -  Physician Assistants and Nurse Practitioners) who all work  together to provide you with the care you need, when you need it. You will need a follow up appointment in 2 months.  Please call our office 2 months in advance to schedule this appointment.  You may see Dr.Kelly or one of the following Advanced Practice Providers on your designated Care Team: Almyra Deforest, [place] . Fabian Sharp, PA-C
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
## 2018-08-01 DIAGNOSIS — Z79891 Long term (current) use of opiate analgesic: Secondary | ICD-10-CM | POA: Diagnosis not present

## 2018-08-01 DIAGNOSIS — M25562 Pain in left knee: Secondary | ICD-10-CM | POA: Diagnosis not present

## 2018-08-01 DIAGNOSIS — G894 Chronic pain syndrome: Secondary | ICD-10-CM | POA: Diagnosis not present

## 2018-08-01 DIAGNOSIS — E785 Hyperlipidemia, unspecified: Secondary | ICD-10-CM | POA: Diagnosis not present

## 2018-08-01 DIAGNOSIS — R011 Cardiac murmur, unspecified: Secondary | ICD-10-CM | POA: Diagnosis not present

## 2018-08-01 DIAGNOSIS — M47812 Spondylosis without myelopathy or radiculopathy, cervical region: Secondary | ICD-10-CM | POA: Diagnosis not present

## 2018-08-01 DIAGNOSIS — R002 Palpitations: Secondary | ICD-10-CM | POA: Diagnosis not present

## 2018-08-01 LAB — COMPREHENSIVE METABOLIC PANEL
ALT: 19 IU/L (ref 0–32)
AST: 19 IU/L (ref 0–40)
Albumin/Globulin Ratio: 1.8 (ref 1.2–2.2)
Albumin: 4.4 g/dL (ref 3.8–4.9)
Alkaline Phosphatase: 56 IU/L (ref 39–117)
BUN/Creatinine Ratio: 25 — ABNORMAL HIGH (ref 9–23)
BUN: 24 mg/dL (ref 6–24)
Bilirubin Total: 0.2 mg/dL (ref 0.0–1.2)
CO2: 26 mmol/L (ref 20–29)
Calcium: 9.9 mg/dL (ref 8.7–10.2)
Chloride: 99 mmol/L (ref 96–106)
Creatinine, Ser: 0.95 mg/dL (ref 0.57–1.00)
GFR calc non Af Amer: 67 mL/min/{1.73_m2} (ref >59)
GFR, EST AFRICAN AMERICAN: 77 mL/min/{1.73_m2} (ref >59)
GLOBULIN, TOTAL: 2.4 g/dL (ref 1.5–4.5)
Glucose: 63 mg/dL — ABNORMAL LOW (ref 65–99)
Potassium: 5 mmol/L (ref 3.5–5.2)
Sodium: 139 mmol/L (ref 134–144)
Total Protein: 6.8 g/dL (ref 6.0–8.5)

## 2018-08-01 LAB — HEMOGLOBIN A1C
Est. average glucose Bld gHb Est-mCnc: 117 mg/dL
Hgb A1c MFr Bld: 5.7 % — ABNORMAL HIGH (ref 4.8–5.6)

## 2018-08-01 LAB — TSH: TSH: 3.95 u[IU]/mL (ref 0.450–4.500)

## 2018-08-01 LAB — CBC
Hematocrit: 34.1 % (ref 34.0–46.6)
Hemoglobin: 11.1 g/dL (ref 11.1–15.9)
MCH: 27.7 pg (ref 26.6–33.0)
MCHC: 32.6 g/dL (ref 31.5–35.7)
MCV: 85 fL (ref 79–97)
Platelets: 309 10*3/uL (ref 150–450)
RBC: 4.01 x10E6/uL (ref 3.77–5.28)
RDW: 13 % (ref 11.7–15.4)
WBC: 6.2 10*3/uL (ref 3.4–10.8)

## 2018-08-01 LAB — LIPID PANEL
Chol/HDL Ratio: 1.6 ratio (ref 0.0–4.4)
Cholesterol, Total: 64 mg/dL — ABNORMAL LOW (ref 100–199)
HDL: 41 mg/dL (ref >39)
LDL Calculated: 16 mg/dL (ref 0–99)
Triglycerides: 37 mg/dL (ref 0–149)
VLDL Cholesterol Cal: 7 mg/dL (ref 5–40)

## 2018-08-01 LAB — MAGNESIUM: MAGNESIUM: 1.6 mg/dL (ref 1.6–2.3)

## 2018-08-03 ENCOUNTER — Encounter: Payer: Self-pay | Admitting: Cardiovascular Disease

## 2018-08-10 ENCOUNTER — Ambulatory Visit (HOSPITAL_COMMUNITY): Payer: Medicare Other | Attending: Cardiology

## 2018-08-10 ENCOUNTER — Ambulatory Visit (INDEPENDENT_AMBULATORY_CARE_PROVIDER_SITE_OTHER): Payer: Medicare Other

## 2018-08-10 ENCOUNTER — Other Ambulatory Visit: Payer: Self-pay

## 2018-08-10 DIAGNOSIS — R002 Palpitations: Secondary | ICD-10-CM

## 2018-08-10 DIAGNOSIS — I119 Hypertensive heart disease without heart failure: Secondary | ICD-10-CM | POA: Diagnosis not present

## 2018-08-10 DIAGNOSIS — E119 Type 2 diabetes mellitus without complications: Secondary | ICD-10-CM | POA: Insufficient documentation

## 2018-08-10 DIAGNOSIS — E785 Hyperlipidemia, unspecified: Secondary | ICD-10-CM | POA: Insufficient documentation

## 2018-08-10 DIAGNOSIS — Z8774 Personal history of (corrected) congenital malformations of heart and circulatory system: Secondary | ICD-10-CM | POA: Insufficient documentation

## 2018-09-11 DIAGNOSIS — G894 Chronic pain syndrome: Secondary | ICD-10-CM | POA: Diagnosis not present

## 2018-09-11 DIAGNOSIS — M25562 Pain in left knee: Secondary | ICD-10-CM | POA: Diagnosis not present

## 2018-09-11 DIAGNOSIS — Z79891 Long term (current) use of opiate analgesic: Secondary | ICD-10-CM | POA: Diagnosis not present

## 2018-09-11 DIAGNOSIS — M47812 Spondylosis without myelopathy or radiculopathy, cervical region: Secondary | ICD-10-CM | POA: Diagnosis not present

## 2018-09-20 ENCOUNTER — Other Ambulatory Visit (HOSPITAL_COMMUNITY): Payer: Self-pay | Admitting: Internal Medicine

## 2018-09-20 ENCOUNTER — Other Ambulatory Visit: Payer: Self-pay

## 2018-09-20 ENCOUNTER — Ambulatory Visit (HOSPITAL_COMMUNITY)
Admission: RE | Admit: 2018-09-20 | Discharge: 2018-09-20 | Disposition: A | Discharge status: Home / Self Care | Payer: Medicare Other | Source: Ambulatory Visit | Attending: Internal Medicine | Admitting: Internal Medicine

## 2018-09-20 DIAGNOSIS — R05 Cough: Secondary | ICD-10-CM | POA: Insufficient documentation

## 2018-09-20 DIAGNOSIS — R059 Cough, unspecified: Secondary | ICD-10-CM

## 2018-09-20 DIAGNOSIS — Z1389 Encounter for screening for other disorder: Secondary | ICD-10-CM | POA: Diagnosis not present

## 2018-09-20 DIAGNOSIS — E119 Type 2 diabetes mellitus without complications: Secondary | ICD-10-CM | POA: Diagnosis not present

## 2018-09-20 DIAGNOSIS — Z0001 Encounter for general adult medical examination with abnormal findings: Secondary | ICD-10-CM | POA: Diagnosis not present

## 2018-09-20 DIAGNOSIS — G43909 Migraine, unspecified, not intractable, without status migrainosus: Secondary | ICD-10-CM | POA: Diagnosis not present

## 2018-10-09 ENCOUNTER — Telehealth: Payer: Self-pay

## 2018-10-09 NOTE — Telephone Encounter (Signed)
I contacted the pt in regards to her 10/11/18 appt. Pt was advised due to current COVID 19 pandemic, our office is severely reducing in office visits until further notice, in order to minimize the risk to our patients and healthcare providers.   Pt was offered a video visit and states she would prefer to come in to the clinic. Pt will come in for a face to face visit on 10/16/18 at 1:30 pm check in time of 1 pm.  Pre screening questions.  Have you had flu like symptoms over the past 2 weeks? Runny Nose, Cough but no recent fever  Have you or have you been exposed to someone who has tested positive for covid-19? No Have you traveled out side of the state over the last 2 weeks? No Pt is listed as low risk for covid 19.

## 2018-10-10 ENCOUNTER — Telehealth: Payer: Self-pay | Admitting: Cardiovascular Disease

## 2018-10-10 NOTE — Telephone Encounter (Signed)
I called pt. I advised her that [facility] is not prescribing opiates. She declined making an appt right now, she is waiting on cardiology results.

## 2018-10-10 NOTE — Telephone Encounter (Signed)
Due to current COVID 19 pandemic, our office is severely reducing in office visits until further notice, in order to minimize the risk to our patients and healthcare providers.   Spoke with patient today regarding her 5/18 appointment. I explained that we are taking necessary precautions to keep patients and staff safe. I made sure she was aware that when she arrives, she will need to park her car near the entrance and a staff member will check her temp/ask questions about virus. During our conversation, patient requested a refill on her methadone as she will run out on Friday. I advised that I would send this info to Dr. Jannifer Franklin and nurse.

## 2018-10-10 NOTE — Telephone Encounter (Signed)
Pt called in and stated she could make it to her appt on 10/16/2018 she will call back in when she can be seen

## 2018-10-10 NOTE — Telephone Encounter (Signed)
The previous note from [facility]N has typo. The patient stated she can not come in. Her appt on 10/16/18 was canceled.

## 2018-10-10 NOTE — Telephone Encounter (Signed)
This patient gets oxycodone and methadone through Dr. Nicholaus Bloom, her pain center physician.  She does not get opiate medications through this office.

## 2018-10-10 NOTE — Telephone Encounter (Signed)
Mychart pending, home phone only, consent, pre reg complete 10/10/18 AF

## 2018-10-11 ENCOUNTER — Encounter: Payer: Self-pay | Admitting: Cardiovascular Disease

## 2018-10-11 ENCOUNTER — Ambulatory Visit: Payer: Medicare Other | Admitting: Neurology

## 2018-10-11 ENCOUNTER — Telehealth (INDEPENDENT_AMBULATORY_CARE_PROVIDER_SITE_OTHER): Payer: Medicare Other | Admitting: Cardiovascular Disease

## 2018-10-11 VITALS — Ht 65.0 in | Wt 132.0 lb

## 2018-10-11 DIAGNOSIS — G894 Chronic pain syndrome: Secondary | ICD-10-CM

## 2018-10-11 DIAGNOSIS — R002 Palpitations: Secondary | ICD-10-CM | POA: Diagnosis not present

## 2018-10-11 DIAGNOSIS — Z8774 Personal history of (corrected) congenital malformations of heart and circulatory system: Secondary | ICD-10-CM | POA: Diagnosis not present

## 2018-10-11 DIAGNOSIS — E785 Hyperlipidemia, unspecified: Secondary | ICD-10-CM

## 2018-10-11 DIAGNOSIS — F419 Anxiety disorder, unspecified: Secondary | ICD-10-CM | POA: Diagnosis not present

## 2018-10-11 DIAGNOSIS — E119 Type 2 diabetes mellitus without complications: Secondary | ICD-10-CM

## 2018-10-11 NOTE — Patient Instructions (Signed)
Medication Instructions:  Take an Extra Metoprolol 25 mg as needed if you have palpitations.  If you need a refill on your cardiac medications before your next appointment, please call your pharmacy.   Follow-Up: At [facility], you and your health needs are our priority.  As part of our continuing mission to provide you with exceptional heart care, we have created designated Provider Care Teams.  These Care Teams include your primary Cardiologist (physician) and Advanced Practice Providers (APPs -  Physician Assistants and Nurse Practitioners) who all work together to provide you with the care you need, when you need it. You will need a follow up appointment in 6 months.  Please call our office 2 months in advance to schedule this appointment.  You may see Dr.Kelly or one of the following Advanced Practice Providers on your designated Care Team: Almyra Deforest, [place] . Fabian Sharp, PA-C
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
## 2018-10-16 ENCOUNTER — Ambulatory Visit: Payer: Self-pay | Admitting: Neurology

## 2018-11-07 DIAGNOSIS — M25562 Pain in left knee: Secondary | ICD-10-CM | POA: Diagnosis not present

## 2018-11-07 DIAGNOSIS — M47812 Spondylosis without myelopathy or radiculopathy, cervical region: Secondary | ICD-10-CM | POA: Diagnosis not present

## 2018-11-07 DIAGNOSIS — G894 Chronic pain syndrome: Secondary | ICD-10-CM | POA: Diagnosis not present

## 2018-11-07 DIAGNOSIS — Z79891 Long term (current) use of opiate analgesic: Secondary | ICD-10-CM | POA: Diagnosis not present

## 2018-11-30 ENCOUNTER — Other Ambulatory Visit: Payer: Medicare Other

## 2018-11-30 ENCOUNTER — Other Ambulatory Visit: Payer: Self-pay

## 2018-11-30 DIAGNOSIS — R6889 Other general symptoms and signs: Secondary | ICD-10-CM | POA: Diagnosis not present

## 2018-11-30 DIAGNOSIS — Z20822 Contact with and (suspected) exposure to covid-19: Secondary | ICD-10-CM

## 2018-11-30 DIAGNOSIS — G894 Chronic pain syndrome: Secondary | ICD-10-CM | POA: Diagnosis not present

## 2018-11-30 DIAGNOSIS — B349 Viral infection, unspecified: Secondary | ICD-10-CM | POA: Diagnosis not present

## 2018-11-30 DIAGNOSIS — R509 Fever, unspecified: Secondary | ICD-10-CM | POA: Diagnosis not present

## 2018-11-30 DIAGNOSIS — I10 Essential (primary) hypertension: Secondary | ICD-10-CM | POA: Diagnosis not present

## 2018-12-07 DIAGNOSIS — M25562 Pain in left knee: Secondary | ICD-10-CM | POA: Diagnosis not present

## 2018-12-07 DIAGNOSIS — Z79891 Long term (current) use of opiate analgesic: Secondary | ICD-10-CM | POA: Diagnosis not present

## 2018-12-07 DIAGNOSIS — G894 Chronic pain syndrome: Secondary | ICD-10-CM | POA: Diagnosis not present

## 2018-12-07 DIAGNOSIS — M47812 Spondylosis without myelopathy or radiculopathy, cervical region: Secondary | ICD-10-CM | POA: Diagnosis not present

## 2018-12-07 LAB — NOVEL CORONAVIRUS, NAA: SARS-CoV-2, NAA: NOT DETECTED

## 2018-12-22 DIAGNOSIS — M1991 Primary osteoarthritis, unspecified site: Secondary | ICD-10-CM | POA: Diagnosis not present

## 2018-12-22 DIAGNOSIS — G894 Chronic pain syndrome: Secondary | ICD-10-CM | POA: Diagnosis not present

## 2018-12-22 DIAGNOSIS — I10 Essential (primary) hypertension: Secondary | ICD-10-CM | POA: Diagnosis not present

## 2018-12-22 DIAGNOSIS — Z1389 Encounter for screening for other disorder: Secondary | ICD-10-CM | POA: Diagnosis not present

## 2019-01-09 DIAGNOSIS — M25562 Pain in left knee: Secondary | ICD-10-CM | POA: Diagnosis not present

## 2019-01-09 DIAGNOSIS — M47812 Spondylosis without myelopathy or radiculopathy, cervical region: Secondary | ICD-10-CM | POA: Diagnosis not present

## 2019-01-09 DIAGNOSIS — Z79891 Long term (current) use of opiate analgesic: Secondary | ICD-10-CM | POA: Diagnosis not present

## 2019-01-09 DIAGNOSIS — G894 Chronic pain syndrome: Secondary | ICD-10-CM | POA: Diagnosis not present

## 2019-02-07 DIAGNOSIS — M47812 Spondylosis without myelopathy or radiculopathy, cervical region: Secondary | ICD-10-CM | POA: Diagnosis not present

## 2019-02-07 DIAGNOSIS — Z79891 Long term (current) use of opiate analgesic: Secondary | ICD-10-CM | POA: Diagnosis not present

## 2019-02-07 DIAGNOSIS — G894 Chronic pain syndrome: Secondary | ICD-10-CM | POA: Diagnosis not present

## 2019-02-07 DIAGNOSIS — M25562 Pain in left knee: Secondary | ICD-10-CM | POA: Diagnosis not present

## 2019-02-15 DIAGNOSIS — Z23 Encounter for immunization: Secondary | ICD-10-CM | POA: Diagnosis not present

## 2019-02-15 DIAGNOSIS — K219 Gastro-esophageal reflux disease without esophagitis: Secondary | ICD-10-CM | POA: Diagnosis not present

## 2019-02-15 DIAGNOSIS — M1991 Primary osteoarthritis, unspecified site: Secondary | ICD-10-CM | POA: Diagnosis not present

## 2019-03-07 DIAGNOSIS — G894 Chronic pain syndrome: Secondary | ICD-10-CM | POA: Diagnosis not present

## 2019-03-07 DIAGNOSIS — M47812 Spondylosis without myelopathy or radiculopathy, cervical region: Secondary | ICD-10-CM | POA: Diagnosis not present

## 2019-03-07 DIAGNOSIS — M25562 Pain in left knee: Secondary | ICD-10-CM | POA: Diagnosis not present

## 2019-03-07 DIAGNOSIS — Z79891 Long term (current) use of opiate analgesic: Secondary | ICD-10-CM | POA: Diagnosis not present

## 2019-03-10 DIAGNOSIS — D649 Anemia, unspecified: Secondary | ICD-10-CM | POA: Diagnosis not present

## 2019-03-10 DIAGNOSIS — R269 Unspecified abnormalities of gait and mobility: Secondary | ICD-10-CM | POA: Diagnosis not present

## 2019-03-10 DIAGNOSIS — Z87891 Personal history of nicotine dependence: Secondary | ICD-10-CM | POA: Diagnosis not present

## 2019-03-10 DIAGNOSIS — Z888 Allergy status to other drugs, medicaments and biological substances status: Secondary | ICD-10-CM | POA: Diagnosis not present

## 2019-03-10 DIAGNOSIS — R270 Ataxia, unspecified: Secondary | ICD-10-CM | POA: Diagnosis not present

## 2019-03-10 DIAGNOSIS — R260 Ataxic gait: Secondary | ICD-10-CM | POA: Diagnosis not present

## 2019-03-10 DIAGNOSIS — R296 Repeated falls: Secondary | ICD-10-CM | POA: Diagnosis not present

## 2019-03-10 DIAGNOSIS — D539 Nutritional anemia, unspecified: Secondary | ICD-10-CM | POA: Diagnosis not present

## 2019-03-10 DIAGNOSIS — Z951 Presence of aortocoronary bypass graft: Secondary | ICD-10-CM | POA: Diagnosis not present

## 2019-03-10 DIAGNOSIS — B9689 Other specified bacterial agents as the cause of diseases classified elsewhere: Secondary | ICD-10-CM | POA: Diagnosis not present

## 2019-03-10 DIAGNOSIS — E119 Type 2 diabetes mellitus without complications: Secondary | ICD-10-CM | POA: Diagnosis not present

## 2019-03-10 DIAGNOSIS — Z880 Allergy status to penicillin: Secondary | ICD-10-CM | POA: Diagnosis not present

## 2019-03-10 DIAGNOSIS — I6782 Cerebral ischemia: Secondary | ICD-10-CM | POA: Diagnosis not present

## 2019-03-10 DIAGNOSIS — N760 Acute vaginitis: Secondary | ICD-10-CM | POA: Diagnosis not present

## 2019-03-10 DIAGNOSIS — E860 Dehydration: Secondary | ICD-10-CM | POA: Diagnosis not present

## 2019-03-11 DIAGNOSIS — R296 Repeated falls: Secondary | ICD-10-CM | POA: Diagnosis not present

## 2019-03-11 DIAGNOSIS — R269 Unspecified abnormalities of gait and mobility: Secondary | ICD-10-CM | POA: Diagnosis not present

## 2019-03-13 DIAGNOSIS — R05 Cough: Secondary | ICD-10-CM | POA: Diagnosis not present

## 2019-04-05 DIAGNOSIS — Z79891 Long term (current) use of opiate analgesic: Secondary | ICD-10-CM | POA: Diagnosis not present

## 2019-04-05 DIAGNOSIS — M47812 Spondylosis without myelopathy or radiculopathy, cervical region: Secondary | ICD-10-CM | POA: Diagnosis not present

## 2019-04-05 DIAGNOSIS — G894 Chronic pain syndrome: Secondary | ICD-10-CM | POA: Diagnosis not present

## 2019-04-05 DIAGNOSIS — M25562 Pain in left knee: Secondary | ICD-10-CM | POA: Diagnosis not present

## 2019-04-06 ENCOUNTER — Telehealth: Payer: Self-pay | Admitting: Cardiovascular Disease

## 2019-04-06 NOTE — Telephone Encounter (Signed)
Called Holly back- advised that per lab work, and last office note Dr.Kelly recommended to reduce dose to 10 mg from 20 mg.  Holly verbalized understanding- had no other questions.

## 2019-04-06 NOTE — Telephone Encounter (Signed)
°  Pt c/o medication issue:  1. Name of Medication: atorvastatin (LIPITOR) 20 MG tablet  2. How are you currently taking this medication (dosage and times per day)? 20 mg  3. Are you having a reaction (difficulty breathing--STAT)? no  4. What is your medication issue?  Holly from [facility] was calling to confirm the dosage that the patient should be on. The patient continues to take 20 mg, but per the note from May, the patient was directed to reduce the dosage to 10mg . Earnest Bailey wants to make sure that the patient is taking the proper dose.

## 2019-04-30 DIAGNOSIS — M1991 Primary osteoarthritis, unspecified site: Secondary | ICD-10-CM | POA: Diagnosis not present

## 2019-04-30 DIAGNOSIS — E1165 Type 2 diabetes mellitus with hyperglycemia: Secondary | ICD-10-CM | POA: Diagnosis not present

## 2019-04-30 DIAGNOSIS — I10 Essential (primary) hypertension: Secondary | ICD-10-CM | POA: Diagnosis not present

## 2019-05-02 DIAGNOSIS — M25562 Pain in left knee: Secondary | ICD-10-CM | POA: Diagnosis not present

## 2019-05-02 DIAGNOSIS — Z79891 Long term (current) use of opiate analgesic: Secondary | ICD-10-CM | POA: Diagnosis not present

## 2019-05-02 DIAGNOSIS — M47812 Spondylosis without myelopathy or radiculopathy, cervical region: Secondary | ICD-10-CM | POA: Diagnosis not present

## 2019-05-02 DIAGNOSIS — G894 Chronic pain syndrome: Secondary | ICD-10-CM | POA: Diagnosis not present

## 2019-05-21 ENCOUNTER — Telehealth: Payer: Self-pay | Admitting: Cardiovascular Disease

## 2019-05-21 NOTE — Telephone Encounter (Signed)
Called pt to let her know that it was ok for husband to come.

## 2019-05-21 NOTE — Telephone Encounter (Signed)
Patient wanted to know if her Husband would be able to come with her to her appointment on 06-25-19. She had some nerve damage and has a hard time remembering things. Her husband will be able to give accurate history

## 2019-05-31 DIAGNOSIS — G894 Chronic pain syndrome: Secondary | ICD-10-CM | POA: Diagnosis not present

## 2019-05-31 DIAGNOSIS — M1991 Primary osteoarthritis, unspecified site: Secondary | ICD-10-CM | POA: Diagnosis not present

## 2019-05-31 DIAGNOSIS — I10 Essential (primary) hypertension: Secondary | ICD-10-CM | POA: Diagnosis not present

## 2019-06-05 DIAGNOSIS — B351 Tinea unguium: Secondary | ICD-10-CM | POA: Diagnosis not present

## 2019-06-05 DIAGNOSIS — G894 Chronic pain syndrome: Secondary | ICD-10-CM | POA: Diagnosis not present

## 2019-06-05 DIAGNOSIS — E1140 Type 2 diabetes mellitus with diabetic neuropathy, unspecified: Secondary | ICD-10-CM | POA: Diagnosis not present

## 2019-06-05 DIAGNOSIS — Z7689 Persons encountering health services in other specified circumstances: Secondary | ICD-10-CM | POA: Diagnosis not present

## 2019-06-05 DIAGNOSIS — R201 Hypoesthesia of skin: Secondary | ICD-10-CM | POA: Diagnosis not present

## 2019-06-05 DIAGNOSIS — I10 Essential (primary) hypertension: Secondary | ICD-10-CM | POA: Diagnosis not present

## 2019-06-25 ENCOUNTER — Ambulatory Visit: Payer: Medicare Other | Admitting: Cardiovascular Disease

## 2019-07-01 DIAGNOSIS — G894 Chronic pain syndrome: Secondary | ICD-10-CM | POA: Diagnosis not present

## 2019-07-01 DIAGNOSIS — M1991 Primary osteoarthritis, unspecified site: Secondary | ICD-10-CM | POA: Diagnosis not present

## 2019-07-01 DIAGNOSIS — I10 Essential (primary) hypertension: Secondary | ICD-10-CM | POA: Diagnosis not present

## 2019-07-03 DIAGNOSIS — G894 Chronic pain syndrome: Secondary | ICD-10-CM | POA: Diagnosis not present

## 2019-07-03 DIAGNOSIS — M47812 Spondylosis without myelopathy or radiculopathy, cervical region: Secondary | ICD-10-CM | POA: Diagnosis not present

## 2019-07-03 DIAGNOSIS — Z79891 Long term (current) use of opiate analgesic: Secondary | ICD-10-CM | POA: Diagnosis not present

## 2019-07-03 DIAGNOSIS — M25562 Pain in left knee: Secondary | ICD-10-CM | POA: Diagnosis not present

## 2019-07-19 ENCOUNTER — Ambulatory Visit: Payer: Medicare Other | Admitting: Cardiovascular Disease

## 2019-07-29 DIAGNOSIS — I10 Essential (primary) hypertension: Secondary | ICD-10-CM | POA: Diagnosis not present

## 2019-07-29 DIAGNOSIS — M1991 Primary osteoarthritis, unspecified site: Secondary | ICD-10-CM | POA: Diagnosis not present

## 2019-07-29 DIAGNOSIS — G894 Chronic pain syndrome: Secondary | ICD-10-CM | POA: Diagnosis not present

## 2019-07-31 DIAGNOSIS — M25562 Pain in left knee: Secondary | ICD-10-CM | POA: Diagnosis not present

## 2019-07-31 DIAGNOSIS — G894 Chronic pain syndrome: Secondary | ICD-10-CM | POA: Diagnosis not present

## 2019-07-31 DIAGNOSIS — Z79891 Long term (current) use of opiate analgesic: Secondary | ICD-10-CM | POA: Diagnosis not present

## 2019-07-31 DIAGNOSIS — M47812 Spondylosis without myelopathy or radiculopathy, cervical region: Secondary | ICD-10-CM | POA: Diagnosis not present

## 2019-08-22 ENCOUNTER — Other Ambulatory Visit (HOSPITAL_COMMUNITY): Payer: Self-pay | Admitting: Internal Medicine

## 2019-08-22 ENCOUNTER — Other Ambulatory Visit: Payer: Self-pay | Admitting: Internal Medicine

## 2019-08-22 DIAGNOSIS — R109 Unspecified abdominal pain: Secondary | ICD-10-CM

## 2019-08-29 DIAGNOSIS — M47812 Spondylosis without myelopathy or radiculopathy, cervical region: Secondary | ICD-10-CM | POA: Diagnosis not present

## 2019-08-29 DIAGNOSIS — M25562 Pain in left knee: Secondary | ICD-10-CM | POA: Diagnosis not present

## 2019-08-29 DIAGNOSIS — I10 Essential (primary) hypertension: Secondary | ICD-10-CM | POA: Diagnosis not present

## 2019-08-29 DIAGNOSIS — G894 Chronic pain syndrome: Secondary | ICD-10-CM | POA: Diagnosis not present

## 2019-08-29 DIAGNOSIS — M1991 Primary osteoarthritis, unspecified site: Secondary | ICD-10-CM | POA: Diagnosis not present

## 2019-08-29 DIAGNOSIS — Z79891 Long term (current) use of opiate analgesic: Secondary | ICD-10-CM | POA: Diagnosis not present

## 2019-09-04 ENCOUNTER — Other Ambulatory Visit: Payer: Self-pay

## 2019-09-04 ENCOUNTER — Ambulatory Visit (HOSPITAL_COMMUNITY)
Admission: RE | Admit: 2019-09-04 | Discharge: 2019-09-04 | Disposition: A | Discharge status: Home / Self Care | Payer: Medicare Other | Source: Ambulatory Visit | Attending: Internal Medicine | Admitting: Internal Medicine

## 2019-09-04 DIAGNOSIS — R109 Unspecified abdominal pain: Secondary | ICD-10-CM | POA: Diagnosis not present

## 2019-09-04 LAB — POCT I-STAT CREATININE: Creatinine, Ser: 1.2 mg/dL — ABNORMAL HIGH (ref 0.44–1.00)

## 2019-09-04 MED ORDER — IOHEXOL 300 MG/ML  SOLN
100.0000 mL | Freq: Once | INTRAMUSCULAR | Status: AC | PRN
  Administered 2019-09-04: 17:00:00 100 mL via INTRAVENOUS

## 2019-09-17 ENCOUNTER — Encounter (HOSPITAL_COMMUNITY): Payer: Self-pay | Admitting: *Deleted

## 2019-09-17 ENCOUNTER — Emergency Department (HOSPITAL_COMMUNITY)
Admission: EM | Admit: 2019-09-17 | Discharge: 2019-09-17 | Disposition: A | Discharge status: Home / Self Care | Payer: Medicare Other | Attending: Emergency Medicine | Admitting: Emergency Medicine

## 2019-09-17 ENCOUNTER — Other Ambulatory Visit: Payer: Self-pay

## 2019-09-17 DIAGNOSIS — Z87891 Personal history of nicotine dependence: Secondary | ICD-10-CM | POA: Diagnosis not present

## 2019-09-17 DIAGNOSIS — I2510 Atherosclerotic heart disease of native coronary artery without angina pectoris: Secondary | ICD-10-CM | POA: Insufficient documentation

## 2019-09-17 DIAGNOSIS — Z7984 Long term (current) use of oral hypoglycemic drugs: Secondary | ICD-10-CM | POA: Insufficient documentation

## 2019-09-17 DIAGNOSIS — I10 Essential (primary) hypertension: Secondary | ICD-10-CM | POA: Diagnosis not present

## 2019-09-17 DIAGNOSIS — E1140 Type 2 diabetes mellitus with diabetic neuropathy, unspecified: Secondary | ICD-10-CM | POA: Diagnosis not present

## 2019-09-17 DIAGNOSIS — R1032 Left lower quadrant pain: Secondary | ICD-10-CM | POA: Diagnosis present

## 2019-09-17 DIAGNOSIS — R140 Abdominal distension (gaseous): Secondary | ICD-10-CM | POA: Diagnosis not present

## 2019-09-17 DIAGNOSIS — Z7982 Long term (current) use of aspirin: Secondary | ICD-10-CM | POA: Diagnosis not present

## 2019-09-17 DIAGNOSIS — Z79899 Other long term (current) drug therapy: Secondary | ICD-10-CM | POA: Insufficient documentation

## 2019-09-17 DIAGNOSIS — N390 Urinary tract infection, site not specified: Secondary | ICD-10-CM | POA: Insufficient documentation

## 2019-09-17 DIAGNOSIS — N939 Abnormal uterine and vaginal bleeding, unspecified: Secondary | ICD-10-CM | POA: Diagnosis not present

## 2019-09-17 LAB — CBC WITH DIFFERENTIAL/PLATELET
Abs Immature Granulocytes: 0.02 10*3/uL (ref 0.00–0.07)
Basophils Absolute: 0.1 10*3/uL (ref 0.0–0.1)
Basophils Relative: 1 %
Eosinophils Absolute: 0.7 10*3/uL — ABNORMAL HIGH (ref 0.0–0.5)
Eosinophils Relative: 9 %
HCT: 36.7 % (ref 36.0–46.0)
Hemoglobin: 11.7 g/dL — ABNORMAL LOW (ref 12.0–15.0)
Immature Granulocytes: 0 %
Lymphocytes Relative: 20 %
Lymphs Abs: 1.5 10*3/uL (ref 0.7–4.0)
MCH: 28.3 pg (ref 26.0–34.0)
MCHC: 31.9 g/dL (ref 30.0–36.0)
MCV: 88.6 fL (ref 80.0–100.0)
Monocytes Absolute: 0.5 10*3/uL (ref 0.1–1.0)
Monocytes Relative: 7 %
Neutro Abs: 4.8 10*3/uL (ref 1.7–7.7)
Neutrophils Relative %: 63 %
Platelets: 339 10*3/uL (ref 150–400)
RBC: 4.14 MIL/uL (ref 3.87–5.11)
RDW: 13.5 % (ref 11.5–15.5)
WBC: 7.6 10*3/uL (ref 4.0–10.5)
nRBC: 0 % (ref 0.0–0.2)

## 2019-09-17 LAB — URINALYSIS, ROUTINE W REFLEX MICROSCOPIC
Glucose, UA: NEGATIVE mg/dL
Hgb urine dipstick: NEGATIVE
Ketones, ur: NEGATIVE mg/dL
Nitrite: NEGATIVE
Protein, ur: 30 mg/dL — AB
Specific Gravity, Urine: 1.025 (ref 1.005–1.030)
WBC, UA: >50 WBC/hpf — ABNORMAL HIGH (ref 0–5)
pH: 6 (ref 5.0–8.0)

## 2019-09-17 LAB — COMPREHENSIVE METABOLIC PANEL
ALT: 15 U/L (ref 0–44)
AST: 20 U/L (ref 15–41)
Albumin: 4 g/dL (ref 3.5–5.0)
Alkaline Phosphatase: 74 U/L (ref 38–126)
Anion gap: 14 (ref 5–15)
BUN: 19 mg/dL (ref 6–20)
CO2: 27 mmol/L (ref 22–32)
Calcium: 9 mg/dL (ref 8.9–10.3)
Chloride: 97 mmol/L — ABNORMAL LOW (ref 98–111)
Creatinine, Ser: 1.3 mg/dL — ABNORMAL HIGH (ref 0.44–1.00)
GFR calc Af Amer: 52 mL/min — ABNORMAL LOW (ref >60)
GFR calc non Af Amer: 45 mL/min — ABNORMAL LOW (ref >60)
Glucose, Bld: 140 mg/dL — ABNORMAL HIGH (ref 70–99)
Potassium: 4.1 mmol/L (ref 3.5–5.1)
Sodium: 138 mmol/L (ref 135–145)
Total Bilirubin: 0.4 mg/dL (ref 0.3–1.2)
Total Protein: 7.6 g/dL (ref 6.5–8.1)

## 2019-09-17 LAB — LIPASE, BLOOD: Lipase: 37 U/L (ref 11–51)

## 2019-09-17 MED ORDER — CIPROFLOXACIN HCL 500 MG PO TABS: 500.0000 mg | ORAL_TABLET | Freq: Two times a day (BID) | ORAL | 0 refills | Status: DC

## 2019-09-17 MED ORDER — CIPROFLOXACIN HCL 250 MG PO TABS
500.0000 mg | ORAL_TABLET | Freq: Once | ORAL | Status: AC
  Administered 2019-09-17: 23:00:00 500 mg via ORAL
  Filled 2019-09-17: qty 2
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
## 2019-09-17 NOTE — ED Triage Notes (Signed)
Abdominal pain with swelling for 2 months, vaginal bleeding for over a year. History of hysterectomy

## 2019-09-17 NOTE — Discharge Instructions (Signed)
Your testing your testing today showed that you have a urinary tract infection.  Your blood work was very unremarkable and reassuring which is great news.  Please take Cipro, twice a day for the next 7 days to treat this infection.  If you need a different antibiotic based on your urinary culture we will call to let you know.  You should follow-up with your doctor within 72 hours or the emergency department for worsening symptoms

## 2019-09-20 LAB — URINE CULTURE: Culture: >=100000 — AB

## 2019-09-21 ENCOUNTER — Telehealth: Payer: Self-pay

## 2019-09-21 NOTE — Telephone Encounter (Signed)
Post ED Visit - Positive Culture Follow-up: Successful Patient Follow-Up  Culture assessed and recommendations reviewed by:  []  Elenor Quinones, Pharm.D. []  Heide Guile, Pharm.D., BCPS AQ-ID []  Parks Neptune, Pharm.D., BCPS []  Alycia Rossetti, Pharm.D., BCPS []  [place], [place].D., BCPS, AAHIVP []  Legrand Como, Pharm.D., BCPS, AAHIVP []  Salome Arnt, PharmD, BCPS []  Johnnette Gourd, PharmD, BCPS []  Hughes Better, PharmD, BCPS []  Leeroy Cha, PharmD Vennie Homans. Pharm D Positive urine culture  []  Patient discharged without antimicrobial prescription and treatment is now indicated [x]  Organism is resistant to prescribed ED discharge antimicrobial []  Patient with positive blood cultures  Changes discussed with ED provider: Cortni Coulture PAC New antibiotic prescription Cepjalexin 500 mg PO BID x 7 days Called to [facility] [phone]  Contacted patient, date 09/21/19, time 1038   Tahiri Shareef, Carolynn Comment 09/21/2019, 10:32 AM

## 2019-09-21 NOTE — Progress Notes (Signed)
ED Antimicrobial Stewardship Positive Culture Follow Up   Anita [name] is an 59 y.o. female who presented to [facility] on 09/17/2019 with a chief complaint of  Chief Complaint  Patient presents with  . Vaginal Bleeding    Recent Results (from the past 720 hour(s))  Urine Culture     Status: Abnormal   Collection Time: 09/17/19  9:21 PM   Specimen: Urine, Random  Result Value Ref Range Status   Specimen Description   Final    URINE, RANDOM Performed at [facility], [address], [place], [place] [address]    Special Requests   Final    NONE Performed at [facility], [address], [place], [place] [address]    Culture >=100,000 COLONIES/mL ESCHERICHIA COLI (A)  Final   Report Status 09/20/2019 FINAL  Final   Organism ID, Bacteria ESCHERICHIA COLI (A)  Final      Susceptibility   Escherichia coli - MIC*    AMPICILLIN >=32 RESISTANT Resistant     CEFAZOLIN <=4 SENSITIVE Sensitive     CEFTRIAXONE <=0.25 SENSITIVE Sensitive     CIPROFLOXACIN >=4 RESISTANT Resistant     GENTAMICIN <=1 SENSITIVE Sensitive     IMIPENEM <=0.25 SENSITIVE Sensitive     NITROFURANTOIN 64 INTERMEDIATE Intermediate     TRIMETH/SULFA 80 RESISTANT Resistant     AMPICILLIN/SULBACTAM >=32 RESISTANT Resistant     PIP/TAZO <=4 SENSITIVE Sensitive     * >=100,000 COLONIES/mL ESCHERICHIA COLI    [x]  Treated with ciprofloxacin, organism resistant to prescribed antimicrobial []  Patient discharged originally without antimicrobial agent and treatment is now indicated  New antibiotic prescription: Cephalexin 500mg  twice daily by mouth for 7 days   ED Provider: Bradly Chris 09/21/2019, 10:11 AM Clinical Pharmacist Monday - Friday phone -  [phone] Saturday - Sunday phone - [phone]

## 2019-09-26 DIAGNOSIS — M25562 Pain in left knee: Secondary | ICD-10-CM | POA: Diagnosis not present

## 2019-09-26 DIAGNOSIS — M47812 Spondylosis without myelopathy or radiculopathy, cervical region: Secondary | ICD-10-CM | POA: Diagnosis not present

## 2019-09-26 DIAGNOSIS — Z79891 Long term (current) use of opiate analgesic: Secondary | ICD-10-CM | POA: Diagnosis not present

## 2019-09-26 DIAGNOSIS — G894 Chronic pain syndrome: Secondary | ICD-10-CM | POA: Diagnosis not present

## 2019-09-27 DIAGNOSIS — N3020 Other chronic cystitis without hematuria: Secondary | ICD-10-CM | POA: Diagnosis not present

## 2019-09-27 DIAGNOSIS — R310 Gross hematuria: Secondary | ICD-10-CM | POA: Diagnosis not present

## 2019-09-28 DIAGNOSIS — M1991 Primary osteoarthritis, unspecified site: Secondary | ICD-10-CM | POA: Diagnosis not present

## 2019-09-28 DIAGNOSIS — E1165 Type 2 diabetes mellitus with hyperglycemia: Secondary | ICD-10-CM | POA: Diagnosis not present

## 2019-09-28 DIAGNOSIS — I10 Essential (primary) hypertension: Secondary | ICD-10-CM | POA: Diagnosis not present

## 2019-10-15 DIAGNOSIS — E119 Type 2 diabetes mellitus without complications: Secondary | ICD-10-CM | POA: Diagnosis not present

## 2019-10-15 DIAGNOSIS — E7849 Other hyperlipidemia: Secondary | ICD-10-CM | POA: Diagnosis not present

## 2019-10-15 DIAGNOSIS — E1140 Type 2 diabetes mellitus with diabetic neuropathy, unspecified: Secondary | ICD-10-CM | POA: Diagnosis not present

## 2019-10-15 DIAGNOSIS — Z0001 Encounter for general adult medical examination with abnormal findings: Secondary | ICD-10-CM | POA: Diagnosis not present

## 2019-10-15 DIAGNOSIS — Z1389 Encounter for screening for other disorder: Secondary | ICD-10-CM | POA: Diagnosis not present

## 2019-10-17 ENCOUNTER — Encounter: Payer: Self-pay | Admitting: Cardiovascular Disease

## 2019-10-17 ENCOUNTER — Telehealth (INDEPENDENT_AMBULATORY_CARE_PROVIDER_SITE_OTHER): Payer: Medicare Other | Admitting: Cardiovascular Disease

## 2019-10-17 VITALS — Ht 65.0 in | Wt 133.0 lb

## 2019-10-17 DIAGNOSIS — Q211 Atrial septal defect, unspecified: Secondary | ICD-10-CM

## 2019-10-17 DIAGNOSIS — R002 Palpitations: Secondary | ICD-10-CM

## 2019-10-17 DIAGNOSIS — G894 Chronic pain syndrome: Secondary | ICD-10-CM

## 2019-10-17 DIAGNOSIS — E785 Hyperlipidemia, unspecified: Secondary | ICD-10-CM | POA: Diagnosis not present

## 2019-10-17 DIAGNOSIS — I10 Essential (primary) hypertension: Secondary | ICD-10-CM | POA: Diagnosis not present

## 2019-10-17 DIAGNOSIS — E119 Type 2 diabetes mellitus without complications: Secondary | ICD-10-CM | POA: Diagnosis not present

## 2019-10-17 DIAGNOSIS — F419 Anxiety disorder, unspecified: Secondary | ICD-10-CM

## 2019-10-17 NOTE — Patient Instructions (Signed)
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
## 2019-10-19 DIAGNOSIS — R310 Gross hematuria: Secondary | ICD-10-CM | POA: Diagnosis not present

## 2019-10-24 ENCOUNTER — Encounter: Payer: Self-pay | Admitting: Cardiovascular Disease

## 2019-10-24 DIAGNOSIS — M47812 Spondylosis without myelopathy or radiculopathy, cervical region: Secondary | ICD-10-CM | POA: Diagnosis not present

## 2019-10-24 DIAGNOSIS — M25562 Pain in left knee: Secondary | ICD-10-CM | POA: Diagnosis not present

## 2019-10-24 DIAGNOSIS — G894 Chronic pain syndrome: Secondary | ICD-10-CM | POA: Diagnosis not present

## 2019-10-24 DIAGNOSIS — Z79891 Long term (current) use of opiate analgesic: Secondary | ICD-10-CM | POA: Diagnosis not present

## 2019-10-29 DIAGNOSIS — E1165 Type 2 diabetes mellitus with hyperglycemia: Secondary | ICD-10-CM | POA: Diagnosis not present

## 2019-10-29 DIAGNOSIS — I10 Essential (primary) hypertension: Secondary | ICD-10-CM | POA: Diagnosis not present

## 2019-10-29 DIAGNOSIS — M1991 Primary osteoarthritis, unspecified site: Secondary | ICD-10-CM | POA: Diagnosis not present

## 2019-10-31 ENCOUNTER — Ambulatory Visit: Payer: Medicare Other | Admitting: Cardiovascular Disease

## 2019-11-21 DIAGNOSIS — G894 Chronic pain syndrome: Secondary | ICD-10-CM | POA: Diagnosis not present

## 2019-11-21 DIAGNOSIS — M47812 Spondylosis without myelopathy or radiculopathy, cervical region: Secondary | ICD-10-CM | POA: Diagnosis not present

## 2019-11-21 DIAGNOSIS — M25562 Pain in left knee: Secondary | ICD-10-CM | POA: Diagnosis not present

## 2019-11-21 DIAGNOSIS — Z79891 Long term (current) use of opiate analgesic: Secondary | ICD-10-CM | POA: Diagnosis not present

## 2019-11-28 DIAGNOSIS — M1991 Primary osteoarthritis, unspecified site: Secondary | ICD-10-CM | POA: Diagnosis not present

## 2019-11-28 DIAGNOSIS — E1165 Type 2 diabetes mellitus with hyperglycemia: Secondary | ICD-10-CM | POA: Diagnosis not present

## 2019-11-28 DIAGNOSIS — I10 Essential (primary) hypertension: Secondary | ICD-10-CM | POA: Diagnosis not present

## 2019-12-05 DIAGNOSIS — N3020 Other chronic cystitis without hematuria: Secondary | ICD-10-CM | POA: Diagnosis not present

## 2019-12-05 DIAGNOSIS — R310 Gross hematuria: Secondary | ICD-10-CM | POA: Diagnosis not present

## 2019-12-05 DIAGNOSIS — N200 Calculus of kidney: Secondary | ICD-10-CM | POA: Diagnosis not present

## 2019-12-06 ENCOUNTER — Other Ambulatory Visit: Payer: Self-pay | Admitting: Urology

## 2019-12-18 DIAGNOSIS — M25562 Pain in left knee: Secondary | ICD-10-CM | POA: Diagnosis not present

## 2019-12-18 DIAGNOSIS — M47812 Spondylosis without myelopathy or radiculopathy, cervical region: Secondary | ICD-10-CM | POA: Diagnosis not present

## 2019-12-18 DIAGNOSIS — Z79891 Long term (current) use of opiate analgesic: Secondary | ICD-10-CM | POA: Diagnosis not present

## 2019-12-18 DIAGNOSIS — N3020 Other chronic cystitis without hematuria: Secondary | ICD-10-CM | POA: Diagnosis not present

## 2019-12-18 DIAGNOSIS — G894 Chronic pain syndrome: Secondary | ICD-10-CM | POA: Diagnosis not present

## 2019-12-18 NOTE — Patient Instructions (Addendum)
DUE TO COVID-19 ONLY ONE VISITOR ARE ALLOWED TO COME WITH YOU AND STAY IN THE WAITING ROOM ONLY DURING PRE OP AND PROCEDURE. THEN TWO VISITORS MAY VISIT WITH YOU IN YOUR PRIVATE ROOM DURING VISITING HOURS ONLY!! (10AM-8PM)   COVID SWAB TESTING MUST BE COMPLETED ON: Saturday, December 22, 2019 @ 12:25 PM [address], [place] -[facility] enter pre surgical testing line (Must self quarantine after testing. Follow instructions on handout.)             Your procedure is scheduled on: Wednesday, December 26, 2019   Report to [facility] Main  Entrance    Report to admitting at 11:30 AM   Call this number if you have problems the morning of surgery [phone]   Do not eat food :After Midnight.   May have liquids until 10:30 AM day of surgery   CLEAR LIQUID DIET  Foods Allowed                                                                     Foods Excluded  Water, Black Coffee and tea, regular and decaf                             liquids that you cannot  Plain Jell-O in any flavor  (No red)                                           see through such as: Fruit ices (not with fruit pulp)                                     milk, soups, orange juice  Iced Popsicles (No red)                                    All solid food                                   Apple juices Sports drinks like Gatorade (No red) Lightly seasoned clear broth or consume(fat free) Sugar, honey syrup  Sample Menu Breakfast                                Lunch                                     Supper Cranberry juice                    Beef broth                            Chicken broth Jell-O  Grape juice                           Apple juice Coffee or tea                        Jell-O                                      Popsicle                                                Coffee or tea                        Coffee or tea   Oral Hygiene is  also important to reduce your risk of infection.                                    Remember - BRUSH YOUR TEETH THE MORNING OF SURGERY WITH YOUR REGULAR TOOTHPASTE   Do NOT smoke after Midnight   Take these medicines the morning of surgery with A SIP OF WATER: Alprazolam, Gabapentin, Metoprolol, Tizandine  DO NOT TAKE ANY ORAL DIABETIC MEDICATIONS DAY OF YOUR SURGERY                               You may not have any metal on your body including hair pins, jewelry, and body piercings             Do not wear make-up, lotions, powders, perfumes/cologne, or deodorant             Do not wear nail polish.  Do not shave  48 hours prior to surgery.              Men may shave face and neck.   Do not bring valuables to the hospital. [place].   Contacts, dentures or bridgework may not be worn into surgery.   Bring small overnight bag day of surgery.    Patients discharged the day of surgery will not be allowed to drive home.   Special Instructions: Bring a copy of your healthcare power of attorney and living will documents         the day of surgery if you haven't scanned them in before.              Please read over the following fact sheets you were given: IF YOU HAVE QUESTIONS ABOUT YOUR PRE OP [place] [phone]  How to Manage Your Diabetes Before and After Surgery  Why is it important to control my blood sugar before and after surgery? . Improving blood sugar levels before and after surgery helps healing and can limit problems. . A way of improving blood sugar control is eating a healthy diet by: o  Eating less sugar and carbohydrates o  Increasing activity/exercise o  Talking with your doctor about reaching your blood sugar goals . High blood  sugars (greater than 180 mg/dL) can raise your risk of infections and slow your recovery, so you will need to focus on controlling your diabetes during the weeks before  surgery. . Make sure that the doctor who takes care of your diabetes knows about your planned surgery including the date and location.  How do I manage my blood sugar before surgery? . Check your blood sugar at least 4 times a day, starting 2 days before surgery, to make sure that the level is not too high or low. o Check your blood sugar the morning of your surgery when you wake up and every 2 hours until you get to the Short Stay unit. . If your blood sugar is less than 70 mg/dL, you will need to treat for low blood sugar: o Do not take insulin. o Treat a low blood sugar (less than 70 mg/dL) with  cup of clear juice (cranberry or apple), 4 glucose tablets, OR glucose gel. o Recheck blood sugar in 15 minutes after treatment (to make sure it is greater than 70 mg/dL). If your blood sugar is not greater than 70 mg/dL on recheck, call [phone] for further instructions. . Report your blood sugar to the short stay nurse when you get to Short Stay.  . If you are admitted to the hospital after surgery: o Your blood sugar will be checked by the staff and you will probably be given insulin after surgery (instead of oral diabetes medicines) to make sure you have good blood sugar levels. o The goal for blood sugar control after surgery is 80-180 mg/dL.   WHAT DO I DO ABOUT MY DIABETES MEDICATION?  Marland Kitchen Do not take oral diabetes medicines (pills) the morning of surgery.   Reviewed and Endorsed by [facility] Patient Education Committee, August 2015  [facility] - Preparing for Surgery Before surgery, you can play an important role.  Because skin is not sterile, your skin needs to be as free of germs as possible.  You can reduce the number of germs on your skin by washing with CHG (chlorahexidine gluconate) soap before surgery.  CHG is an antiseptic cleaner which kills germs and bonds with the skin to continue killing germs even after washing. Please DO NOT use if you have an allergy to CHG or  antibacterial soaps.  If your skin becomes reddened/irritated stop using the CHG and inform your nurse when you arrive at Short Stay. Do not shave (including legs and underarms) for at least 48 hours prior to the first CHG shower.  You may shave your face/neck.  Please follow these instructions carefully:  1.  Shower with CHG Soap the night before surgery and the  morning of surgery.  2.  If you choose to wash your hair, wash your hair first as usual with your normal  shampoo.  3.  After you shampoo, rinse your hair and body thoroughly to remove the shampoo.                             4.  Use CHG as you would any other liquid soap.  You can apply chg directly to the skin and wash.  Gently with a scrungie or clean washcloth.  5.  Apply the CHG Soap to your body ONLY FROM THE NECK DOWN.   Do   not use on face/ open  Wound or open sores. Avoid contact with eyes, ears mouth and   genitals (private parts).                       Wash face,  Genitals (private parts) with your normal soap.             6.  Wash thoroughly, paying special attention to the area where your    surgery  will be performed.  7.  Thoroughly rinse your body with warm water from the neck down.  8.  DO NOT shower/wash with your normal soap after using and rinsing off the CHG Soap.                9.  Pat yourself dry with a clean towel.            10.  Wear clean pajamas.            11.  Place clean sheets on your bed the night of your first shower and do not  sleep with pets. Day of Surgery : Do not apply any lotions/deodorants the morning of surgery.  Please wear clean clothes to the hospital/surgery center.  FAILURE TO FOLLOW THESE INSTRUCTIONS MAY RESULT IN THE CANCELLATION OF YOUR SURGERY  PATIENT SIGNATURE_________________________________  NURSE SIGNATURE__________________________________  ________________________________________________________________________

## 2019-12-18 NOTE — Progress Notes (Signed)
COVID Vaccine Completed: Date COVID Vaccine completed: COVID vaccine manufacturer: Pfizer    Moderna   [employer]   PCP - Dr. Selena Batten Cardiologist - Dr. Corky Downs last office visit 10/17/19 in epic  Chest x-ray - greater than 1 year EKG - 12/20/2019 in epic pre op Stress Test -  ECHO - 08/10/18 in epic Cardiac Cath - greater than 2years  Sleep Study -  CPAP -   Fasting Blood Sugar -  Checks Blood Sugar _____ times a day  Blood Thinner Instructions: Aspirin Instructions: Last Dose:  Anesthesia review: history of ASD repair, CAD  Patient denies shortness of breath, fever, cough and chest pain at PAT appointment   Patient verbalized understanding of instructions that were given to them at the PAT appointment. Patient was also instructed that they will need to review over the PAT instructions again at home before surgery.

## 2019-12-20 ENCOUNTER — Encounter (HOSPITAL_COMMUNITY): Payer: Self-pay

## 2019-12-20 ENCOUNTER — Encounter (HOSPITAL_COMMUNITY)
Admission: RE | Admit: 2019-12-20 | Discharge: 2019-12-20 | Disposition: A | Discharge status: Home / Self Care | Payer: Medicare Other | Source: Ambulatory Visit | Attending: Urology | Admitting: Urology

## 2019-12-20 ENCOUNTER — Other Ambulatory Visit: Payer: Self-pay

## 2019-12-20 DIAGNOSIS — F329 Major depressive disorder, single episode, unspecified: Secondary | ICD-10-CM | POA: Diagnosis not present

## 2019-12-20 DIAGNOSIS — Z79899 Other long term (current) drug therapy: Secondary | ICD-10-CM | POA: Insufficient documentation

## 2019-12-20 DIAGNOSIS — F419 Anxiety disorder, unspecified: Secondary | ICD-10-CM | POA: Insufficient documentation

## 2019-12-20 DIAGNOSIS — Z7982 Long term (current) use of aspirin: Secondary | ICD-10-CM | POA: Diagnosis not present

## 2019-12-20 DIAGNOSIS — E785 Hyperlipidemia, unspecified: Secondary | ICD-10-CM | POA: Diagnosis not present

## 2019-12-20 DIAGNOSIS — Z7901 Long term (current) use of anticoagulants: Secondary | ICD-10-CM | POA: Insufficient documentation

## 2019-12-20 DIAGNOSIS — I10 Essential (primary) hypertension: Secondary | ICD-10-CM | POA: Insufficient documentation

## 2019-12-20 DIAGNOSIS — Z01818 Encounter for other preprocedural examination: Secondary | ICD-10-CM | POA: Insufficient documentation

## 2019-12-20 DIAGNOSIS — E118 Type 2 diabetes mellitus with unspecified complications: Secondary | ICD-10-CM | POA: Insufficient documentation

## 2019-12-20 DIAGNOSIS — Z7984 Long term (current) use of oral hypoglycemic drugs: Secondary | ICD-10-CM | POA: Diagnosis not present

## 2019-12-20 DIAGNOSIS — N200 Calculus of kidney: Secondary | ICD-10-CM | POA: Diagnosis not present

## 2019-12-20 DIAGNOSIS — Z87891 Personal history of nicotine dependence: Secondary | ICD-10-CM | POA: Insufficient documentation

## 2019-12-20 HISTORY — DX: Cardiac murmur, unspecified: R01.1

## 2019-12-20 HISTORY — DX: Other complications of anesthesia, initial encounter: T88.59XA

## 2019-12-20 HISTORY — DX: Cerebral infarction, unspecified: I63.9

## 2019-12-20 HISTORY — DX: Dyspnea, unspecified: R06.00

## 2019-12-20 HISTORY — DX: Pneumonia, unspecified organism: J18.9

## 2019-12-20 HISTORY — DX: Personal history of urinary calculi: Z87.442

## 2019-12-20 LAB — GLUCOSE, CAPILLARY: Glucose-Capillary: 104 mg/dL — ABNORMAL HIGH (ref 70–99)

## 2019-12-20 LAB — CBC
HCT: 39.2 % (ref 36.0–46.0)
Hemoglobin: 11.9 g/dL — ABNORMAL LOW (ref 12.0–15.0)
MCH: 27 pg (ref 26.0–34.0)
MCHC: 30.4 g/dL (ref 30.0–36.0)
MCV: 89.1 fL (ref 80.0–100.0)
Platelets: 374 10*3/uL (ref 150–400)
RBC: 4.4 MIL/uL (ref 3.87–5.11)
RDW: 12.9 % (ref 11.5–15.5)
WBC: 7.1 10*3/uL (ref 4.0–10.5)
nRBC: 0 % (ref 0.0–0.2)

## 2019-12-20 LAB — BASIC METABOLIC PANEL
Anion gap: 10 (ref 5–15)
BUN: 21 mg/dL — ABNORMAL HIGH (ref 6–20)
CO2: 28 mmol/L (ref 22–32)
Calcium: 9.5 mg/dL (ref 8.9–10.3)
Chloride: 102 mmol/L (ref 98–111)
Creatinine, Ser: 1.15 mg/dL — ABNORMAL HIGH (ref 0.44–1.00)
GFR calc Af Amer: >60 mL/min (ref >60)
GFR calc non Af Amer: 52 mL/min — ABNORMAL LOW (ref >60)
Glucose, Bld: 77 mg/dL (ref 70–99)
Potassium: 4.7 mmol/L (ref 3.5–5.1)
Sodium: 140 mmol/L (ref 135–145)

## 2019-12-20 LAB — HEMOGLOBIN A1C
Hgb A1c MFr Bld: 6.4 % — ABNORMAL HIGH (ref 4.8–5.6)
Mean Plasma Glucose: 136.98 mg/dL

## 2019-12-20 NOTE — Progress Notes (Addendum)
COVID Vaccine Completed: x2 Date COVID Vaccine completed:  May 2021 COVID vaccine manufacturer: Pfizer    PCP - Dr. Selena Batten Cardiologist - Dr. Corky Downs last office visit 10/17/19 in epic  Chest x-ray - greater than 1 year EKG - 12/20/2019 in epic pre op Stress Test - N/A ECHO - 08/10/18 in epic Cardiac Cath - greater than 2years Sleep Study - N/A CPAP - N/A  Fasting Blood Sugar - 70s Checks Blood Sugar once a week  Blood Thinner Instructions: Aspirin Instructions: 81 mg.  Patient instructed to stop five days before surgery. Last Dose:   Anesthesia review: history of ASD repair, CAD  Patient denies shortness of breath, fever, cough and chest pain at PAT appointment   Patient verbalized understanding of instructions that were given to them at the PAT appointment. Patient was also instructed that they will need to review over the PAT instructions again at home before surgery.

## 2019-12-21 NOTE — Anesthesia Preprocedure Evaluation (Addendum)
Anesthesia Evaluation  Patient identified by MRN, date of birth, ID band Patient awake    Reviewed: Allergy & Precautions, NPO status , Patient's Chart, lab work & pertinent test results  Airway Mallampati: II  TM Distance: >3 FB Neck ROM: Full    Dental no notable dental hx.    Pulmonary neg pulmonary ROS, former smoker,    Pulmonary exam normal breath sounds clear to auscultation       Cardiovascular hypertension, Normal cardiovascular exam Rhythm:Regular Rate:Normal     Neuro/Psych Depression Chronic pain on Methadone TIA   GI/Hepatic negative GI ROS, Neg liver ROS,   Endo/Other  diabetes  Renal/GU negative Renal ROS  negative genitourinary   Musculoskeletal negative musculoskeletal ROS (+)   Abdominal   Peds negative pediatric ROS (+)  Hematology negative hematology ROS (+)   Anesthesia Other Findings   Reproductive/Obstetrics negative OB ROS                            Anesthesia Physical Anesthesia Plan  ASA: III  Anesthesia Plan: General   Post-op Pain Management:    Induction: Intravenous  PONV Risk Score and Plan: 3 and Ondansetron, Dexamethasone, Midazolam and Treatment may vary due to age or medical condition  Airway Management Planned: LMA  Additional Equipment:   Intra-op Plan:   Post-operative Plan: Extubation in OR  Informed Consent: I have reviewed the patients History and Physical, chart, labs and discussed the procedure including the risks, benefits and alternatives for the proposed anesthesia with the patient or authorized representative who has indicated his/her understanding and acceptance.     Dental advisory given  Plan Discussed with: CRNA and Surgeon  Anesthesia Plan Comments: (See PAT note 12/20/2019, Konrad Felix, PA-C)       Anesthesia Quick Evaluation
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
## 2019-12-22 ENCOUNTER — Other Ambulatory Visit (HOSPITAL_COMMUNITY)
Admission: RE | Admit: 2019-12-22 | Discharge: 2019-12-22 | Disposition: A | Discharge status: Home / Self Care | Payer: Medicare Other | Source: Ambulatory Visit | Attending: Urology | Admitting: Urology

## 2019-12-22 DIAGNOSIS — Z01812 Encounter for preprocedural laboratory examination: Secondary | ICD-10-CM | POA: Diagnosis not present

## 2019-12-22 DIAGNOSIS — Z20822 Contact with and (suspected) exposure to covid-19: Secondary | ICD-10-CM | POA: Insufficient documentation

## 2019-12-22 LAB — SARS CORONAVIRUS 2 (TAT 6-24 HRS): SARS Coronavirus 2: NEGATIVE

## 2019-12-26 ENCOUNTER — Ambulatory Visit (HOSPITAL_COMMUNITY): Payer: Medicare Other | Admitting: Anesthesiology

## 2019-12-26 ENCOUNTER — Ambulatory Visit (HOSPITAL_COMMUNITY): Payer: Medicare Other

## 2019-12-26 ENCOUNTER — Encounter (HOSPITAL_COMMUNITY)
Admission: RE | Disposition: A | Discharge status: Home / Self Care | Payer: Self-pay | Source: Home / Self Care | Attending: Urology

## 2019-12-26 ENCOUNTER — Ambulatory Visit (HOSPITAL_COMMUNITY): Payer: Medicare Other | Admitting: Physician Assistant

## 2019-12-26 ENCOUNTER — Encounter (HOSPITAL_COMMUNITY): Payer: Self-pay | Admitting: Urology

## 2019-12-26 ENCOUNTER — Ambulatory Visit (HOSPITAL_COMMUNITY)
Admission: RE | Admit: 2019-12-26 | Discharge: 2019-12-26 | Disposition: A | Discharge status: Home / Self Care | Payer: Medicare Other | Attending: Urology | Admitting: Urology

## 2019-12-26 DIAGNOSIS — Z7982 Long term (current) use of aspirin: Secondary | ICD-10-CM | POA: Diagnosis not present

## 2019-12-26 DIAGNOSIS — Z8673 Personal history of transient ischemic attack (TIA), and cerebral infarction without residual deficits: Secondary | ICD-10-CM | POA: Insufficient documentation

## 2019-12-26 DIAGNOSIS — N201 Calculus of ureter: Secondary | ICD-10-CM | POA: Diagnosis not present

## 2019-12-26 DIAGNOSIS — G629 Polyneuropathy, unspecified: Secondary | ICD-10-CM | POA: Insufficient documentation

## 2019-12-26 DIAGNOSIS — Z79891 Long term (current) use of opiate analgesic: Secondary | ICD-10-CM | POA: Insufficient documentation

## 2019-12-26 DIAGNOSIS — G8929 Other chronic pain: Secondary | ICD-10-CM | POA: Diagnosis not present

## 2019-12-26 DIAGNOSIS — N329 Bladder disorder, unspecified: Secondary | ICD-10-CM | POA: Diagnosis not present

## 2019-12-26 DIAGNOSIS — Z79899 Other long term (current) drug therapy: Secondary | ICD-10-CM | POA: Diagnosis not present

## 2019-12-26 DIAGNOSIS — E119 Type 2 diabetes mellitus without complications: Secondary | ICD-10-CM | POA: Diagnosis not present

## 2019-12-26 DIAGNOSIS — Z87891 Personal history of nicotine dependence: Secondary | ICD-10-CM | POA: Insufficient documentation

## 2019-12-26 DIAGNOSIS — I10 Essential (primary) hypertension: Secondary | ICD-10-CM | POA: Insufficient documentation

## 2019-12-26 DIAGNOSIS — Z7984 Long term (current) use of oral hypoglycemic drugs: Secondary | ICD-10-CM | POA: Insufficient documentation

## 2019-12-26 DIAGNOSIS — F329 Major depressive disorder, single episode, unspecified: Secondary | ICD-10-CM | POA: Insufficient documentation

## 2019-12-26 DIAGNOSIS — F419 Anxiety disorder, unspecified: Secondary | ICD-10-CM | POA: Diagnosis not present

## 2019-12-26 DIAGNOSIS — N2889 Other specified disorders of kidney and ureter: Secondary | ICD-10-CM | POA: Diagnosis not present

## 2019-12-26 DIAGNOSIS — N200 Calculus of kidney: Secondary | ICD-10-CM | POA: Insufficient documentation

## 2019-12-26 DIAGNOSIS — I25110 Atherosclerotic heart disease of native coronary artery with unstable angina pectoris: Secondary | ICD-10-CM | POA: Diagnosis not present

## 2019-12-26 HISTORY — PX: CYSTOSCOPY/URETEROSCOPY/HOLMIUM LASER/STENT PLACEMENT: SHX6546

## 2019-12-26 LAB — GLUCOSE, CAPILLARY
Glucose-Capillary: 92 mg/dL (ref 70–99)
Glucose-Capillary: 152 mg/dL — ABNORMAL HIGH (ref 70–99)

## 2019-12-26 SURGERY — CYSTOSCOPY/URETEROSCOPY/HOLMIUM LASER/STENT PLACEMENT
Anesthesia: General | Laterality: Left

## 2019-12-26 MED ORDER — MIDAZOLAM HCL 2 MG/2ML IJ SOLN
INTRAMUSCULAR | Status: AC
  Filled 2019-12-26: qty 2

## 2019-12-26 MED ORDER — 0.9 % SODIUM CHLORIDE (POUR BTL) OPTIME
TOPICAL | Status: DC | PRN
  Administered 2019-12-26: 1000 mL

## 2019-12-26 MED ORDER — LACTATED RINGERS IV SOLN: INTRAVENOUS | Status: DC

## 2019-12-26 MED ORDER — MIDAZOLAM HCL 2 MG/2ML IJ SOLN
INTRAMUSCULAR | Status: DC | PRN
  Administered 2019-12-26: 2 mg via INTRAVENOUS

## 2019-12-26 MED ORDER — SODIUM CHLORIDE 0.9 % IR SOLN
Status: DC | PRN
  Administered 2019-12-26: 3000 mL

## 2019-12-26 MED ORDER — ONDANSETRON HCL 4 MG/2ML IJ SOLN
INTRAMUSCULAR | Status: AC
  Filled 2019-12-26: qty 2

## 2019-12-26 MED ORDER — PROPOFOL 10 MG/ML IV BOLUS
INTRAVENOUS | Status: DC | PRN
  Administered 2019-12-26: 150 mg via INTRAVENOUS

## 2019-12-26 MED ORDER — CHLORHEXIDINE GLUCONATE 0.12 % MT SOLN
15.0000 mL | Freq: Once | OROMUCOSAL | Status: AC
  Administered 2019-12-26: 15 mL via OROMUCOSAL

## 2019-12-26 MED ORDER — STERILE WATER FOR IRRIGATION IR SOLN
Status: DC | PRN
  Administered 2019-12-26: 3000 mL

## 2019-12-26 MED ORDER — CIPROFLOXACIN IN D5W 400 MG/200ML IV SOLN
400.0000 mg | INTRAVENOUS | Status: AC
  Administered 2019-12-26: 400 mg via INTRAVENOUS
  Filled 2019-12-26: qty 200

## 2019-12-26 MED ORDER — DEXAMETHASONE SODIUM PHOSPHATE 10 MG/ML IJ SOLN
INTRAMUSCULAR | Status: AC
  Filled 2019-12-26: qty 1

## 2019-12-26 MED ORDER — FENTANYL CITRATE (PF) 100 MCG/2ML IJ SOLN
INTRAMUSCULAR | Status: AC
  Filled 2019-12-26: qty 2

## 2019-12-26 MED ORDER — DEXAMETHASONE SODIUM PHOSPHATE 4 MG/ML IJ SOLN
INTRAMUSCULAR | Status: DC | PRN
  Administered 2019-12-26: 10 mg via INTRAVENOUS

## 2019-12-26 MED ORDER — LIDOCAINE 2% (20 MG/ML) 5 ML SYRINGE
INTRAMUSCULAR | Status: DC | PRN
  Administered 2019-12-26: 100 mg via INTRAVENOUS

## 2019-12-26 MED ORDER — FENTANYL CITRATE (PF) 100 MCG/2ML IJ SOLN: 25.0000 ug | INTRAMUSCULAR | Status: DC | PRN

## 2019-12-26 MED ORDER — IOHEXOL 300 MG/ML  SOLN
INTRAMUSCULAR | Status: DC | PRN
  Administered 2019-12-26: 10 mL

## 2019-12-26 MED ORDER — LIDOCAINE 2% (20 MG/ML) 5 ML SYRINGE
INTRAMUSCULAR | Status: AC
  Filled 2019-12-26: qty 5

## 2019-12-26 MED ORDER — FENTANYL CITRATE (PF) 100 MCG/2ML IJ SOLN
INTRAMUSCULAR | Status: DC | PRN
  Administered 2019-12-26 (×4): 50 ug via INTRAVENOUS

## 2019-12-26 MED ORDER — ONDANSETRON HCL 4 MG/2ML IJ SOLN
INTRAMUSCULAR | Status: DC | PRN
  Administered 2019-12-26: 4 mg via INTRAVENOUS

## 2019-12-26 MED ORDER — ORAL CARE MOUTH RINSE: 15.0000 mL | Freq: Once | OROMUCOSAL | Status: AC

## 2019-12-26 MED ORDER — PROPOFOL 10 MG/ML IV BOLUS
INTRAVENOUS | Status: AC
  Filled 2019-12-26: qty 20

## 2019-12-26 MED ORDER — KETOROLAC TROMETHAMINE 30 MG/ML IJ SOLN: 30.0000 mg | Freq: Once | INTRAMUSCULAR | Status: DC | PRN

## 2019-12-26 SURGICAL SUPPLY — 32 items
BAG DRN RND TRDRP ANRFLXCHMBR (UROLOGICAL SUPPLIES)
BAG URINE DRAIN 2000ML AR STRL (UROLOGICAL SUPPLIES) IMPLANT
BAG URO CATCHER STRL LF (MISCELLANEOUS) ×4 IMPLANT
BASKET LASER NITINOL 1.9FR (BASKET) IMPLANT
BASKET ZERO TIP NITINOL 2.4FR (BASKET) IMPLANT
BSKT STON RTRVL 120 1.9FR (BASKET)
BSKT STON RTRVL ZERO TP 2.4FR (BASKET)
CATH FOLEY 2WAY SLVR  5CC 18FR (CATHETERS)
CATH FOLEY 2WAY SLVR 5CC 18FR (CATHETERS) IMPLANT
CATH INTERMIT  6FR 70CM (CATHETERS) ×4 IMPLANT
CLOTH BEACON ORANGE TIMEOUT ST (SAFETY) ×4 IMPLANT
ELECT REM PT RETURN 15FT ADLT (MISCELLANEOUS) ×4 IMPLANT
EXTRACTOR STONE 1.7FRX115CM (UROLOGICAL SUPPLIES) IMPLANT
FIBER LASER FLEXIVA 365 (UROLOGICAL SUPPLIES) IMPLANT
GLOVE BIO SURGEON STRL SZ7.5 (GLOVE) ×4 IMPLANT
GOWN STRL REUS W/TWL XL LVL3 (GOWN DISPOSABLE) ×4 IMPLANT
GUIDEWIRE ANG ZIPWIRE 038X150 (WIRE) IMPLANT
GUIDEWIRE STR DUAL SENSOR (WIRE) ×4 IMPLANT
KIT TURNOVER KIT A (KITS) IMPLANT
LASER FIBER FLEXIV TRACTIP 200 (Laser) IMPLANT
LOOP CUT BIPOLAR 24F LRG (ELECTROSURGICAL) IMPLANT
MANIFOLD NEPTUNE II (INSTRUMENTS) ×4 IMPLANT
PACK CYSTO (CUSTOM PROCEDURE TRAY) ×4 IMPLANT
PENCIL SMOKE EVACUATOR (MISCELLANEOUS) IMPLANT
PLUG CATH AND CAP STER (CATHETERS) IMPLANT
SHEATH URETERAL 12FRX28CM (UROLOGICAL SUPPLIES) IMPLANT
SHEATH URETERAL 12FRX35CM (MISCELLANEOUS) ×3 IMPLANT
SYR TOOMEY IRRIG 70ML (MISCELLANEOUS)
SYRINGE TOOMEY IRRIG 70ML (MISCELLANEOUS) IMPLANT
TUBING CONNECTING 10 (TUBING) ×3 IMPLANT
TUBING CONNECTING 10' (TUBING) ×1
TUBING UROLOGY SET (TUBING) ×4 IMPLANT
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
## 2019-12-26 NOTE — Transfer of Care (Signed)
Immediate Anesthesia Transfer of Care Note  Patient: Anita [name]  Procedure(s) Performed: CYSTOSCOPY DIAGNOSTIC LEFT URETEROSCOPY/STENT PLACEMENT/RETROGRADE (Left )  Patient Location: PACU  Anesthesia Type:General  Level of Consciousness: awake, alert , oriented and patient cooperative  Airway & Oxygen Therapy: Patient Spontanous Breathing and Patient connected to face mask  Post-op Assessment: Report given to RN and Post -op Vital signs reviewed and stable  Post vital signs: Reviewed and stable  Last Vitals:  Vitals Value Taken Time  BP 126/95 12/26/19 1500  Temp    Pulse 57 12/26/19 1504  Resp 15 12/26/19 1504  SpO2 100 % 12/26/19 1504  Vitals shown include unvalidated device data.  Last Pain:  Vitals:   12/26/19 1237  TempSrc:   PainSc: 7       Patients Stated Pain Goal: 4 (61/51/83 4373)  Complications: No complications documented.

## 2019-12-26 NOTE — Op Note (Signed)
Operative Note  Preoperative diagnosis:  1.  Bladder lesion 2.  Left renal calculus  Postoperative diagnosis: 1.  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  Cystoscopy with left retrograde pyelogram, left diagnostic ureteroscopy, ureteral stent placement  Surgeon: Link Snuffer, MD  Assistants: None  Anesthesia: General  Complications: None immediate  EBL: Minimal  Specimens: 1.  None  Drains/Catheters: 1.  6 x 24 double-J ureteral stent  Intraoperative findings: 1.  Normal anterior urethra 2.  Complete inspection of bladder mucosa revealed no evidence of any bladder lesion.  Previously seen area of erythema was no longer present. 3.  Left retrograde pyelogram revealed evidence of a calyceal diverticulum.  Direct visualization confirmed that there was maybe a tiny opening leading to this but no way to retrieve a stone.  Indication: 59 year old female with chronic cystitis and intermittent gross hematuria was found on cystoscopy to have an area of erythema that was persistent.  She also had a left renal calculus on CT scan.  After discussion of different options, she elects to undergo the above operation.  Description of procedure:  The patient was identified and consent was obtained.  The patient was taken to the operating room and placed in the supine position.  The patient was placed under general anesthesia.  Perioperative antibiotics were administered.  The patient was placed in dorsal lithotomy.  Patient was prepped and draped in a standard sterile fashion and a timeout was performed.  A 21 French rigid cystoscope was advanced into the urethra and into the bladder.  Complete cystoscopy was performed.  There were no abnormal findings within the bladder.  Specifically, the area of erythema that has been seen previously was no longer present.  There was nothing to biopsy.  I cannulated the left ureter with a sensor wire and advanced up to the kidney under  fluoroscopic guidance.  A second wire was advanced alongside this up into the kidney and the scope was withdrawn.  1 the wires was secured to the drape as a safety wire.  I used the inner sheath of a 12 x 14 access sheath to pass over one of the wires under continuous fluoroscopic guidance to passively dilate the ureter.  I then passed the entire sheath over the wire under continuous fluoroscopic guidance up the ureter.  The inner sheath along with the wire were withdrawn.  A digital ureteroscope was advanced into the kidney.  Complete pyeloscopy was performed.  I did not see a stone.  I shot a retrograde pyelogram which revealed a very narrow calyces leading to the stone indicating a likely calyceal diverticulum.  Direct visualization did not visualize any obvious opening.  There was no free stone to treat.  I therefore withdrew the scope along with the access sheath keeping the wire in place.  I backloaded the wire onto a rigid cystoscope and advanced that into the bladder followed by routine placement of a 6 x 24 double-J ureteral stent.  Fluoroscopy confirmed proximal placement and direct visualization confirmed a good coil within the bladder.  I drained the bladder withdrew the scope.  Patient tolerated procedure well and was stable postoperative.  Plan: Return in 1 week for stent removal

## 2019-12-26 NOTE — Anesthesia Postprocedure Evaluation (Signed)
Anesthesia Post Note  Patient: Anita [name]  Procedure(s) Performed: CYSTOSCOPY DIAGNOSTIC LEFT URETEROSCOPY/STENT PLACEMENT/RETROGRADE (Left )     Patient location during evaluation: PACU Anesthesia Type: General Level of consciousness: awake and alert Pain management: pain level controlled Vital Signs Assessment: post-procedure vital signs reviewed and stable Respiratory status: spontaneous breathing, nonlabored ventilation, respiratory function stable and patient connected to nasal cannula oxygen Cardiovascular status: blood pressure returned to baseline and stable Postop Assessment: no apparent nausea or vomiting Anesthetic complications: no   No complications documented.  Last Vitals:  Vitals:   12/26/19 1530 12/26/19 1553  BP: (!) 160/81 (!) 173/83  Pulse: 59 62  Resp: 16 18  Temp: 37.1 C 36.6 C  SpO2: 93% 95%    Last Pain:  Vitals:   12/26/19 1553  TempSrc: Oral  PainSc:                  Shalini Mair S

## 2019-12-26 NOTE — Discharge Instructions (Signed)

## 2019-12-26 NOTE — Anesthesia Procedure Notes (Signed)
Procedure Name: LMA Insertion Date/Time: 12/26/2019 2:11 PM Performed by: Claudia Desanctis, CRNA Pre-anesthesia Checklist: Emergency Drugs available, Patient identified, Suction available and Patient being monitored Patient Re-evaluated:Patient Re-evaluated prior to induction Oxygen Delivery Method: Circle system utilized Preoxygenation: Pre-oxygenation with 100% oxygen Induction Type: IV induction Ventilation: Mask ventilation without difficulty LMA: LMA inserted LMA Size: 4.0 Number of attempts: 1 Placement Confirmation: positive ETCO2 and breath sounds checked- equal and bilateral Tube secured with: Tape Dental Injury: Teeth and Oropharynx as per pre-operative assessment

## 2019-12-27 ENCOUNTER — Encounter (HOSPITAL_COMMUNITY): Payer: Self-pay | Admitting: Urology

## 2019-12-28 DIAGNOSIS — E1165 Type 2 diabetes mellitus with hyperglycemia: Secondary | ICD-10-CM | POA: Diagnosis not present

## 2019-12-28 DIAGNOSIS — M1991 Primary osteoarthritis, unspecified site: Secondary | ICD-10-CM | POA: Diagnosis not present

## 2019-12-28 DIAGNOSIS — I10 Essential (primary) hypertension: Secondary | ICD-10-CM | POA: Diagnosis not present

## 2020-01-03 DIAGNOSIS — N200 Calculus of kidney: Secondary | ICD-10-CM | POA: Diagnosis not present

## 2020-01-16 ENCOUNTER — Encounter (INDEPENDENT_AMBULATORY_CARE_PROVIDER_SITE_OTHER): Payer: Self-pay | Admitting: Gastroenterology

## 2020-01-16 DIAGNOSIS — M25562 Pain in left knee: Secondary | ICD-10-CM | POA: Diagnosis not present

## 2020-01-16 DIAGNOSIS — Z79891 Long term (current) use of opiate analgesic: Secondary | ICD-10-CM | POA: Diagnosis not present

## 2020-01-16 DIAGNOSIS — G894 Chronic pain syndrome: Secondary | ICD-10-CM | POA: Diagnosis not present

## 2020-01-16 DIAGNOSIS — M47812 Spondylosis without myelopathy or radiculopathy, cervical region: Secondary | ICD-10-CM | POA: Diagnosis not present

## 2020-01-29 DIAGNOSIS — E119 Type 2 diabetes mellitus without complications: Secondary | ICD-10-CM | POA: Diagnosis not present

## 2020-01-29 DIAGNOSIS — E1165 Type 2 diabetes mellitus with hyperglycemia: Secondary | ICD-10-CM | POA: Diagnosis not present

## 2020-01-29 DIAGNOSIS — M1991 Primary osteoarthritis, unspecified site: Secondary | ICD-10-CM | POA: Diagnosis not present

## 2020-02-07 ENCOUNTER — Ambulatory Visit (INDEPENDENT_AMBULATORY_CARE_PROVIDER_SITE_OTHER): Payer: Medicare Other | Admitting: Gastroenterology

## 2020-02-13 DIAGNOSIS — M47812 Spondylosis without myelopathy or radiculopathy, cervical region: Secondary | ICD-10-CM | POA: Diagnosis not present

## 2020-02-13 DIAGNOSIS — Z79891 Long term (current) use of opiate analgesic: Secondary | ICD-10-CM | POA: Diagnosis not present

## 2020-02-13 DIAGNOSIS — G894 Chronic pain syndrome: Secondary | ICD-10-CM | POA: Diagnosis not present

## 2020-02-13 DIAGNOSIS — M25562 Pain in left knee: Secondary | ICD-10-CM | POA: Diagnosis not present

## 2020-02-28 DIAGNOSIS — I10 Essential (primary) hypertension: Secondary | ICD-10-CM | POA: Diagnosis not present

## 2020-02-28 DIAGNOSIS — M1991 Primary osteoarthritis, unspecified site: Secondary | ICD-10-CM | POA: Diagnosis not present

## 2020-02-28 DIAGNOSIS — G894 Chronic pain syndrome: Secondary | ICD-10-CM | POA: Diagnosis not present

## 2020-03-10 DIAGNOSIS — M47812 Spondylosis without myelopathy or radiculopathy, cervical region: Secondary | ICD-10-CM | POA: Diagnosis not present

## 2020-03-10 DIAGNOSIS — Z79891 Long term (current) use of opiate analgesic: Secondary | ICD-10-CM | POA: Diagnosis not present

## 2020-03-10 DIAGNOSIS — G894 Chronic pain syndrome: Secondary | ICD-10-CM | POA: Diagnosis not present

## 2020-03-10 DIAGNOSIS — M25562 Pain in left knee: Secondary | ICD-10-CM | POA: Diagnosis not present

## 2020-03-29 DIAGNOSIS — M1991 Primary osteoarthritis, unspecified site: Secondary | ICD-10-CM | POA: Diagnosis not present

## 2020-03-29 DIAGNOSIS — I10 Essential (primary) hypertension: Secondary | ICD-10-CM | POA: Diagnosis not present

## 2020-03-29 DIAGNOSIS — G894 Chronic pain syndrome: Secondary | ICD-10-CM | POA: Diagnosis not present

## 2020-04-08 DIAGNOSIS — M47812 Spondylosis without myelopathy or radiculopathy, cervical region: Secondary | ICD-10-CM | POA: Diagnosis not present

## 2020-04-08 DIAGNOSIS — M25562 Pain in left knee: Secondary | ICD-10-CM | POA: Diagnosis not present

## 2020-04-08 DIAGNOSIS — G894 Chronic pain syndrome: Secondary | ICD-10-CM | POA: Diagnosis not present

## 2020-04-08 DIAGNOSIS — Z79891 Long term (current) use of opiate analgesic: Secondary | ICD-10-CM | POA: Diagnosis not present

## 2020-04-29 DIAGNOSIS — I10 Essential (primary) hypertension: Secondary | ICD-10-CM | POA: Diagnosis not present

## 2020-04-29 DIAGNOSIS — G894 Chronic pain syndrome: Secondary | ICD-10-CM | POA: Diagnosis not present

## 2020-04-29 DIAGNOSIS — M1991 Primary osteoarthritis, unspecified site: Secondary | ICD-10-CM | POA: Diagnosis not present

## 2020-05-06 DIAGNOSIS — G894 Chronic pain syndrome: Secondary | ICD-10-CM | POA: Diagnosis not present

## 2020-05-06 DIAGNOSIS — M47812 Spondylosis without myelopathy or radiculopathy, cervical region: Secondary | ICD-10-CM | POA: Diagnosis not present

## 2020-05-06 DIAGNOSIS — Z79891 Long term (current) use of opiate analgesic: Secondary | ICD-10-CM | POA: Diagnosis not present

## 2020-05-06 DIAGNOSIS — M25562 Pain in left knee: Secondary | ICD-10-CM | POA: Diagnosis not present

## 2020-05-20 ENCOUNTER — Telehealth: Payer: Self-pay | Admitting: Cardiovascular Disease

## 2020-05-20 NOTE — Telephone Encounter (Signed)
The pt states she has had increasing episodes of chest pain recently. She says the pain is sharp and shooting in nature and she can feel it midline as well as under her left breast. She states this generally occurs when she is "up moving around" and particularly at times of great stress (such as when she is experiencing caregiver strain for her mother, who is suffering from Alzheimer's). The pt denies shortness of breath and jaw pain. She does, however, notice that this discomfort can radiate down her left arm all the way to her wrist. Left arm movement exacerbates the pain. Deep breaths also cause pain. She states that nitroglycerin relieves her discomfort. BP has been elevated in the 140s recently too, though she reports BP 120/80 today. The pt states she is taking all her medications as prescribed.  The pt was advised to go to the ER for chest pain that is not relieved by nitroglycerin or the development of new symptoms such as shortness of breath. Appointment made with Almyra Deforest, PA-C for June 17, 2020 at 8:45 a.m. Pt states she also needs to make a follow up appointment with Dr. Claiborne Billings. Appointment made with Dr. Claiborne Billings on 08/29/20 at 2:40 p.m.   The patient verbalizes understanding and agreement with plan.

## 2020-05-20 NOTE — Telephone Encounter (Signed)
    Pt c/o of Chest Pain: STAT if CP now or developed within 24 hours  1. Are you having CP right now? No   2. Are you experiencing any other symptoms (ex. SOB, nausea, vomiting, sweating)? nausea  3. How long have you been experiencing CP? 1 week  4. Is your CP continuous or coming and going? coming and going  5. Have you taken Nitroglycerin? Yes    Pt said she's been having chest pain since last week, last night she took nitroglycerin and it did help the pain. She wanted to see Dr. Claiborne Billings, no available appt, offered PA but she said she only wants to see Dr. Claiborne Billings.

## 2020-05-30 DIAGNOSIS — I10 Essential (primary) hypertension: Secondary | ICD-10-CM | POA: Diagnosis not present

## 2020-05-30 DIAGNOSIS — G894 Chronic pain syndrome: Secondary | ICD-10-CM | POA: Diagnosis not present

## 2020-05-30 DIAGNOSIS — M1991 Primary osteoarthritis, unspecified site: Secondary | ICD-10-CM | POA: Diagnosis not present

## 2020-06-01 ENCOUNTER — Emergency Department (HOSPITAL_COMMUNITY)
Admission: EM | Admit: 2020-06-01 | Discharge: 2020-06-02 | Disposition: A | Discharge status: Home / Self Care | Payer: Medicare Other | Attending: Emergency Medicine | Admitting: Emergency Medicine

## 2020-06-01 ENCOUNTER — Emergency Department (HOSPITAL_COMMUNITY): Payer: Medicare Other

## 2020-06-01 ENCOUNTER — Other Ambulatory Visit: Payer: Self-pay

## 2020-06-01 ENCOUNTER — Encounter (HOSPITAL_COMMUNITY): Payer: Self-pay

## 2020-06-01 DIAGNOSIS — E162 Hypoglycemia, unspecified: Secondary | ICD-10-CM

## 2020-06-01 DIAGNOSIS — E11649 Type 2 diabetes mellitus with hypoglycemia without coma: Secondary | ICD-10-CM | POA: Diagnosis not present

## 2020-06-01 DIAGNOSIS — R404 Transient alteration of awareness: Secondary | ICD-10-CM

## 2020-06-01 DIAGNOSIS — Z7982 Long term (current) use of aspirin: Secondary | ICD-10-CM | POA: Diagnosis not present

## 2020-06-01 DIAGNOSIS — Z7984 Long term (current) use of oral hypoglycemic drugs: Secondary | ICD-10-CM | POA: Insufficient documentation

## 2020-06-01 DIAGNOSIS — E1142 Type 2 diabetes mellitus with diabetic polyneuropathy: Secondary | ICD-10-CM | POA: Insufficient documentation

## 2020-06-01 DIAGNOSIS — Z79899 Other long term (current) drug therapy: Secondary | ICD-10-CM | POA: Diagnosis not present

## 2020-06-01 DIAGNOSIS — R0689 Other abnormalities of breathing: Secondary | ICD-10-CM | POA: Diagnosis not present

## 2020-06-01 DIAGNOSIS — Z8673 Personal history of transient ischemic attack (TIA), and cerebral infarction without residual deficits: Secondary | ICD-10-CM | POA: Diagnosis not present

## 2020-06-01 DIAGNOSIS — E161 Other hypoglycemia: Secondary | ICD-10-CM | POA: Diagnosis not present

## 2020-06-01 DIAGNOSIS — N3000 Acute cystitis without hematuria: Secondary | ICD-10-CM

## 2020-06-01 DIAGNOSIS — I2510 Atherosclerotic heart disease of native coronary artery without angina pectoris: Secondary | ICD-10-CM | POA: Insufficient documentation

## 2020-06-01 DIAGNOSIS — R000 Tachycardia, unspecified: Secondary | ICD-10-CM | POA: Diagnosis not present

## 2020-06-01 DIAGNOSIS — Z20822 Contact with and (suspected) exposure to covid-19: Secondary | ICD-10-CM | POA: Diagnosis not present

## 2020-06-01 DIAGNOSIS — I10 Essential (primary) hypertension: Secondary | ICD-10-CM | POA: Insufficient documentation

## 2020-06-01 DIAGNOSIS — Z87891 Personal history of nicotine dependence: Secondary | ICD-10-CM | POA: Insufficient documentation

## 2020-06-01 DIAGNOSIS — R262 Difficulty in walking, not elsewhere classified: Secondary | ICD-10-CM | POA: Diagnosis not present

## 2020-06-01 DIAGNOSIS — R531 Weakness: Secondary | ICD-10-CM | POA: Diagnosis not present

## 2020-06-01 LAB — DIFFERENTIAL
Abs Immature Granulocytes: 0.02 10*3/uL (ref 0.00–0.07)
Basophils Absolute: 0 10*3/uL (ref 0.0–0.1)
Basophils Relative: 1 %
Eosinophils Absolute: 0.1 10*3/uL (ref 0.0–0.5)
Eosinophils Relative: 1 %
Immature Granulocytes: 0 %
Lymphocytes Relative: 14 %
Lymphs Abs: 1.1 10*3/uL (ref 0.7–4.0)
Monocytes Absolute: 0.4 10*3/uL (ref 0.1–1.0)
Monocytes Relative: 5 %
Neutro Abs: 6.2 10*3/uL (ref 1.7–7.7)
Neutrophils Relative %: 79 %

## 2020-06-01 LAB — I-STAT CHEM 8, ED
BUN: 34 mg/dL — ABNORMAL HIGH (ref 6–20)
Calcium, Ion: 1.22 mmol/L (ref 1.15–1.40)
Chloride: 102 mmol/L (ref 98–111)
Creatinine, Ser: 1.5 mg/dL — ABNORMAL HIGH (ref 0.44–1.00)
Glucose, Bld: 61 mg/dL — ABNORMAL LOW (ref 70–99)
HCT: 39 % (ref 36.0–46.0)
Hemoglobin: 13.3 g/dL (ref 12.0–15.0)
Potassium: 4.9 mmol/L (ref 3.5–5.1)
Sodium: 140 mmol/L (ref 135–145)
TCO2: 27 mmol/L (ref 22–32)

## 2020-06-01 LAB — COMPREHENSIVE METABOLIC PANEL
ALT: 19 U/L (ref 0–44)
AST: 28 U/L (ref 15–41)
Albumin: 4.9 g/dL (ref 3.5–5.0)
Alkaline Phosphatase: 56 U/L (ref 38–126)
Anion gap: 14 (ref 5–15)
BUN: 34 mg/dL — ABNORMAL HIGH (ref 6–20)
CO2: 25 mmol/L (ref 22–32)
Calcium: 10 mg/dL (ref 8.9–10.3)
Chloride: 99 mmol/L (ref 98–111)
Creatinine, Ser: 1.68 mg/dL — ABNORMAL HIGH (ref 0.44–1.00)
GFR, Estimated: 35 mL/min — ABNORMAL LOW (ref >60)
Glucose, Bld: 62 mg/dL — ABNORMAL LOW (ref 70–99)
Potassium: 4.8 mmol/L (ref 3.5–5.1)
Sodium: 138 mmol/L (ref 135–145)
Total Bilirubin: 0.4 mg/dL (ref 0.3–1.2)
Total Protein: 8.9 g/dL — ABNORMAL HIGH (ref 6.5–8.1)

## 2020-06-01 LAB — CBC
HCT: 38.6 % (ref 36.0–46.0)
Hemoglobin: 12.5 g/dL (ref 12.0–15.0)
MCH: 28.5 pg (ref 26.0–34.0)
MCHC: 32.4 g/dL (ref 30.0–36.0)
MCV: 88.1 fL (ref 80.0–100.0)
Platelets: 376 10*3/uL (ref 150–400)
RBC: 4.38 MIL/uL (ref 3.87–5.11)
RDW: 13.1 % (ref 11.5–15.5)
WBC: 7.8 10*3/uL (ref 4.0–10.5)
nRBC: 0 % (ref 0.0–0.2)

## 2020-06-01 LAB — CBG MONITORING, ED
Glucose-Capillary: 135 mg/dL — ABNORMAL HIGH (ref 70–99)
Glucose-Capillary: 45 mg/dL — ABNORMAL LOW (ref 70–99)
Glucose-Capillary: 54 mg/dL — ABNORMAL LOW (ref 70–99)

## 2020-06-01 LAB — RESP PANEL BY RT-PCR (FLU A&B, COVID) ARPGX2
Influenza A by PCR: NEGATIVE
Influenza B by PCR: NEGATIVE
SARS Coronavirus 2 by RT PCR: NEGATIVE

## 2020-06-01 LAB — PROTIME-INR
INR: 0.9 (ref 0.8–1.2)
Prothrombin Time: 11.5 seconds (ref 11.4–15.2)

## 2020-06-01 LAB — APTT: aPTT: 31 seconds (ref 24–36)

## 2020-06-01 LAB — ETHANOL: Alcohol, Ethyl (B): <10 mg/dL (ref <10)

## 2020-06-01 MED ORDER — DEXTROSE 50 % IV SOLN
1.0000 | Freq: Once | INTRAVENOUS | Status: AC
  Administered 2020-06-01: 50 mL via INTRAVENOUS
  Filled 2020-06-01: qty 50

## 2020-06-01 NOTE — ED Notes (Signed)
Pt in CT.

## 2020-06-01 NOTE — ED Notes (Signed)
ED Provider at bedside. 
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
## 2020-06-01 NOTE — ED Triage Notes (Signed)
PT called out slurred speech which resolved upon EMS arrival. EMS stated she had a very unsteady gait, no other impairments.    CBG was 54 on EMS arrival, down to 45 now.

## 2020-06-02 LAB — URINALYSIS, ROUTINE W REFLEX MICROSCOPIC
Bacteria, UA: NONE SEEN
Bilirubin Urine: NEGATIVE
Glucose, UA: 50 mg/dL — AB
Ketones, ur: NEGATIVE mg/dL
Nitrite: POSITIVE — AB
Protein, ur: 30 mg/dL — AB
Specific Gravity, Urine: 1.011 (ref 1.005–1.030)
WBC, UA: >50 WBC/hpf — ABNORMAL HIGH (ref 0–5)
pH: 6 (ref 5.0–8.0)

## 2020-06-02 LAB — RAPID URINE DRUG SCREEN, HOSP PERFORMED
Amphetamines: NOT DETECTED
Barbiturates: NOT DETECTED
Benzodiazepines: POSITIVE — AB
Cocaine: NOT DETECTED
Opiates: NOT DETECTED
Tetrahydrocannabinol: NOT DETECTED

## 2020-06-02 MED ORDER — CEPHALEXIN 500 MG PO CAPS
500.0000 mg | ORAL_CAPSULE | Freq: Once | ORAL | Status: AC
  Administered 2020-06-02: 500 mg via ORAL
  Filled 2020-06-02: qty 1

## 2020-06-02 MED ORDER — CEPHALEXIN 500 MG PO CAPS: 500.0000 mg | ORAL_CAPSULE | Freq: Three times a day (TID) | ORAL | 0 refills | Status: DC

## 2020-06-02 NOTE — Discharge Instructions (Addendum)
Begin taking Keflex as prescribed.  Keep a record of your blood pressure sugars over the next several days, then follow-up later this week with your primary doctor.  Return to the emergency department if you experience any new and/or concerning symptoms.

## 2020-06-02 NOTE — ED Notes (Signed)
Pt ambulated to bathroom with cane and standby assist, reports this is her baseline gait. Urine sample obtained and sent to lab. POC blood glucose updated. VS updated. Swallow evaluation passed, pt eating and drinking without complication.

## 2020-06-02 NOTE — ED Provider Notes (Signed)
Care assumed from Dr. Rubin Payor and Langston Masker at shift change.  Patient is a 60 year old female presenting here with change in mental status as described in Karen's note.  Patient signed out to me awaiting results of laboratory studies, urinalysis, and CT scan.  CT scan shows a lacunar infarct, but no acute finding.  Laboratory studies are essentially unremarkable with the exception of a blood sugar that has run low during her emergency department course.  When check was as low as 45.  Patient was given D50 along with soda and crackers and is now feeling markedly improved.  She does seem to have a urinary tract infection based on her urinalysis.  She will be given Keflex for this.  I suspect the issues she experienced this evening are related to her blood sugar.  Now that her blood sugars have corrected, she is feeling much better and has no complaints.  She is comfortable with going home and I feel as though this is a reasonable disposition.  Patient to keep track of her blood sugars at home and return if she experiences any new and/or concerning issues.   Geoffery Lyons, MD 06/02/20 Rich Fuchs

## 2020-06-11 DIAGNOSIS — M25562 Pain in left knee: Secondary | ICD-10-CM | POA: Diagnosis not present

## 2020-06-11 DIAGNOSIS — M47812 Spondylosis without myelopathy or radiculopathy, cervical region: Secondary | ICD-10-CM | POA: Diagnosis not present

## 2020-06-11 DIAGNOSIS — Z79891 Long term (current) use of opiate analgesic: Secondary | ICD-10-CM | POA: Diagnosis not present

## 2020-06-11 DIAGNOSIS — G894 Chronic pain syndrome: Secondary | ICD-10-CM | POA: Diagnosis not present

## 2020-06-16 ENCOUNTER — Telehealth: Payer: Self-pay | Admitting: Physician Assistant

## 2020-06-16 NOTE — Telephone Encounter (Signed)
Called patient's number multiple times---constantly busy---Anita [name] has left message with patient's mother for her to call and reschedule her appointment.

## 2020-06-17 ENCOUNTER — Ambulatory Visit: Payer: Medicare Other | Admitting: Physician Assistant

## 2020-06-28 DIAGNOSIS — G894 Chronic pain syndrome: Secondary | ICD-10-CM | POA: Diagnosis not present

## 2020-06-28 DIAGNOSIS — M1991 Primary osteoarthritis, unspecified site: Secondary | ICD-10-CM | POA: Diagnosis not present

## 2020-06-28 DIAGNOSIS — I10 Essential (primary) hypertension: Secondary | ICD-10-CM | POA: Diagnosis not present
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
## 2020-07-04 ENCOUNTER — Other Ambulatory Visit: Payer: Self-pay

## 2020-07-04 ENCOUNTER — Encounter: Payer: Self-pay | Admitting: Adult Health

## 2020-07-04 ENCOUNTER — Ambulatory Visit (INDEPENDENT_AMBULATORY_CARE_PROVIDER_SITE_OTHER): Payer: Medicare Other | Admitting: Adult Health

## 2020-07-04 VITALS — BP 90/58 | HR 55 | Ht 65.0 in | Wt 141.6 lb

## 2020-07-04 DIAGNOSIS — R319 Hematuria, unspecified: Secondary | ICD-10-CM | POA: Diagnosis not present

## 2020-07-04 DIAGNOSIS — I10 Essential (primary) hypertension: Secondary | ICD-10-CM | POA: Diagnosis not present

## 2020-07-04 DIAGNOSIS — E559 Vitamin D deficiency, unspecified: Secondary | ICD-10-CM | POA: Diagnosis not present

## 2020-07-04 DIAGNOSIS — G894 Chronic pain syndrome: Secondary | ICD-10-CM | POA: Diagnosis not present

## 2020-07-04 DIAGNOSIS — I2510 Atherosclerotic heart disease of native coronary artery without angina pectoris: Secondary | ICD-10-CM

## 2020-07-04 DIAGNOSIS — Z79899 Other long term (current) drug therapy: Secondary | ICD-10-CM

## 2020-07-04 NOTE — Patient Instructions (Signed)
Medication Instructions:  Continue current medications  *If you need a refill on your cardiac medications before your next appointment, please call your pharmacy*   Lab Work: CBC, BMP and Vit D  If you have labs (blood work) drawn today and your tests are completely normal, you will receive your results only by: Marland Kitchen MyChart Message (if you have MyChart) OR . A paper copy in the mail If you have any lab test that is abnormal or we need to change your treatment, we will call you to review the results.   Testing/Procedures: None Ordered   Follow-Up: At [facility], you and your health needs are our priority.  As part of our continuing mission to provide you with exceptional heart care, we have created designated Provider Care Teams.  These Care Teams include your primary Cardiologist (physician) and Advanced Practice Providers (APPs -  Physician Assistants and Nurse Practitioners) who all work together to provide you with the care you need, when you need it.  We recommend signing up for the patient portal called "MyChart".  Sign up information is provided on this After Visit Summary.  MyChart is used to connect with patients for Virtual Visits (Telemedicine).  Patients are able to view lab/test results, encounter notes, upcoming appointments, etc.  Non-urgent messages can be sent to your provider as well.   To learn more about what you can do with MyChart, go to [email or web address].    Your next appointment:   1 month(s)  The format for your next appointment:   In Person  Provider:   Jory Sims, DNP, ANP

## 2020-07-05 LAB — CBC
Hematocrit: 31 % — ABNORMAL LOW (ref 34.0–46.6)
Hemoglobin: 9.9 g/dL — ABNORMAL LOW (ref 11.1–15.9)
MCH: 27.7 pg (ref 26.6–33.0)
MCHC: 31.9 g/dL (ref 31.5–35.7)
MCV: 87 fL (ref 79–97)
Platelets: 335 10*3/uL (ref 150–450)
RBC: 3.57 x10E6/uL — ABNORMAL LOW (ref 3.77–5.28)
RDW: 13.2 % (ref 11.7–15.4)
WBC: 7.2 10*3/uL (ref 3.4–10.8)

## 2020-07-05 LAB — VITAMIN D 25 HYDROXY (VIT D DEFICIENCY, FRACTURES): Vit D, 25-Hydroxy: 74.1 ng/mL (ref 30.0–100.0)

## 2020-07-05 LAB — BASIC METABOLIC PANEL
BUN/Creatinine Ratio: 18 (ref 9–23)
BUN: 24 mg/dL (ref 6–24)
CO2: 25 mmol/L (ref 20–29)
Calcium: 9.5 mg/dL (ref 8.7–10.2)
Chloride: 101 mmol/L (ref 96–106)
Creatinine, Ser: 1.35 mg/dL — ABNORMAL HIGH (ref 0.57–1.00)
GFR calc Af Amer: 50 mL/min/{1.73_m2} — ABNORMAL LOW (ref >59)
GFR calc non Af Amer: 43 mL/min/{1.73_m2} — ABNORMAL LOW (ref >59)
Glucose: 174 mg/dL — ABNORMAL HIGH (ref 65–99)
Potassium: 5.4 mmol/L — ABNORMAL HIGH (ref 3.5–5.2)
Sodium: 141 mmol/L (ref 134–144)

## 2020-07-08 ENCOUNTER — Telehealth: Payer: Self-pay | Admitting: *Deleted

## 2020-07-08 NOTE — Telephone Encounter (Signed)
Call and spoke to pt about her blood work, pt stated she is having worst bleeding than when we saw her on 02/04, advised pt to go to ER if she can not get a visit with her PCP tomorrow, pt stated she will call her PCP tomorrow and if she can not get in she will go to the ER.

## 2020-07-08 NOTE — Telephone Encounter (Signed)
-----   Message from Lendon Colonel, NP sent at 07/06/2020 10:09 AM EST ----- The patient is anemic with significant drop in her Hgb from 13.3 to 9.9. She has been having some bleeding with urination and some in between with some during bowel movements. She needs to see PCP ASAP. She will need to have further work up for the urinary system and GI.   She was supposed to have a follow up referral to Dr. Gloriann Loan, but it may take a while to get in, so she will need to see PCP first.Tell her to let the PCP know that your blood counts are dropping and that she is bleeding.  Renal function is also taking a hit,    KL

## 2020-07-09 DIAGNOSIS — R310 Gross hematuria: Secondary | ICD-10-CM | POA: Diagnosis not present

## 2020-07-09 DIAGNOSIS — G894 Chronic pain syndrome: Secondary | ICD-10-CM | POA: Diagnosis not present

## 2020-07-09 DIAGNOSIS — I10 Essential (primary) hypertension: Secondary | ICD-10-CM | POA: Diagnosis not present

## 2020-07-09 DIAGNOSIS — M47812 Spondylosis without myelopathy or radiculopathy, cervical region: Secondary | ICD-10-CM | POA: Diagnosis not present

## 2020-07-09 DIAGNOSIS — E118 Type 2 diabetes mellitus with unspecified complications: Secondary | ICD-10-CM | POA: Diagnosis not present

## 2020-07-09 DIAGNOSIS — M25562 Pain in left knee: Secondary | ICD-10-CM | POA: Diagnosis not present

## 2020-07-09 DIAGNOSIS — D509 Iron deficiency anemia, unspecified: Secondary | ICD-10-CM | POA: Diagnosis not present

## 2020-07-09 DIAGNOSIS — Z79891 Long term (current) use of opiate analgesic: Secondary | ICD-10-CM | POA: Diagnosis not present

## 2020-08-01 ENCOUNTER — Encounter: Payer: Self-pay | Admitting: Adult Health

## 2020-08-01 ENCOUNTER — Ambulatory Visit (INDEPENDENT_AMBULATORY_CARE_PROVIDER_SITE_OTHER): Payer: Medicare Other | Admitting: Adult Health

## 2020-08-01 ENCOUNTER — Other Ambulatory Visit: Payer: Self-pay

## 2020-08-01 VITALS — BP 138/58 | HR 72 | Ht 65.0 in | Wt 139.4 lb

## 2020-08-01 DIAGNOSIS — I10 Essential (primary) hypertension: Secondary | ICD-10-CM

## 2020-08-01 DIAGNOSIS — R1032 Left lower quadrant pain: Secondary | ICD-10-CM | POA: Diagnosis not present

## 2020-08-01 DIAGNOSIS — R319 Hematuria, unspecified: Secondary | ICD-10-CM

## 2020-08-01 DIAGNOSIS — I2510 Atherosclerotic heart disease of native coronary artery without angina pectoris: Secondary | ICD-10-CM

## 2020-08-01 DIAGNOSIS — I639 Cerebral infarction, unspecified: Secondary | ICD-10-CM
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
## 2020-08-01 NOTE — Patient Instructions (Signed)
Medication Instructions:  Continue current medications  *If you need a refill on your cardiac medications before your next appointment, please call your pharmacy*   Lab Work: None Ordered   Testing/Procedures: None Ordered   Follow-Up: At [facility], you and your health needs are our priority.  As part of our continuing mission to provide you with exceptional heart care, we have created designated Provider Care Teams.  These Care Teams include your primary Cardiologist (physician) and Advanced Practice Providers (APPs -  Physician Assistants and Nurse Practitioners) who all work together to provide you with the care you need, when you need it.  We recommend signing up for the patient portal called "MyChart".  Sign up information is provided on this After Visit Summary.  MyChart is used to connect with patients for Virtual Visits (Telemedicine).  Patients are able to view lab/test results, encounter notes, upcoming appointments, etc.  Non-urgent messages can be sent to your provider as well.   To learn more about what you can do with MyChart, go to [email or web address].    Your next appointment:   Friday April 1st @ 2:40 pm  The format for your next appointment:   In Person  Provider:   Shelva Majestic, MD

## 2020-08-08 DIAGNOSIS — M25562 Pain in left knee: Secondary | ICD-10-CM | POA: Diagnosis not present

## 2020-08-08 DIAGNOSIS — G894 Chronic pain syndrome: Secondary | ICD-10-CM | POA: Diagnosis not present

## 2020-08-08 DIAGNOSIS — M47812 Spondylosis without myelopathy or radiculopathy, cervical region: Secondary | ICD-10-CM | POA: Diagnosis not present

## 2020-08-08 DIAGNOSIS — Z79891 Long term (current) use of opiate analgesic: Secondary | ICD-10-CM | POA: Diagnosis not present

## 2020-08-27 DIAGNOSIS — I10 Essential (primary) hypertension: Secondary | ICD-10-CM | POA: Diagnosis not present

## 2020-08-29 ENCOUNTER — Ambulatory Visit: Payer: Medicare Other | Admitting: Cardiovascular Disease

## 2020-08-29 ENCOUNTER — Other Ambulatory Visit: Payer: Self-pay

## 2020-08-29 ENCOUNTER — Encounter: Payer: Self-pay | Admitting: Cardiovascular Disease

## 2020-08-29 VITALS — BP 142/64 | HR 56 | Ht 65.0 in | Wt 140.8 lb

## 2020-08-29 DIAGNOSIS — I2510 Atherosclerotic heart disease of native coronary artery without angina pectoris: Secondary | ICD-10-CM

## 2020-08-29 DIAGNOSIS — E119 Type 2 diabetes mellitus without complications: Secondary | ICD-10-CM | POA: Diagnosis not present

## 2020-08-29 DIAGNOSIS — I10 Essential (primary) hypertension: Secondary | ICD-10-CM | POA: Diagnosis not present

## 2020-08-29 DIAGNOSIS — Q211 Atrial septal defect, unspecified: Secondary | ICD-10-CM

## 2020-08-29 DIAGNOSIS — E785 Hyperlipidemia, unspecified: Secondary | ICD-10-CM

## 2020-08-29 DIAGNOSIS — G894 Chronic pain syndrome: Secondary | ICD-10-CM | POA: Diagnosis not present

## 2020-08-29 DIAGNOSIS — I639 Cerebral infarction, unspecified: Secondary | ICD-10-CM

## 2020-08-29 DIAGNOSIS — F419 Anxiety disorder, unspecified: Secondary | ICD-10-CM

## 2020-08-29 DIAGNOSIS — R002 Palpitations: Secondary | ICD-10-CM | POA: Diagnosis not present
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
## 2020-08-29 NOTE — Patient Instructions (Signed)
Medication Instructions:   NO CHANGES  *If you need a refill on your cardiac medications before your next appointment, please call your pharmacy*   Lab Work:  NOT NEEDED  Testing/Procedures: NOT NEEDED   Follow-Up: At [facility], you and your health needs are our priority.  As part of our continuing mission to provide you with exceptional heart care, we have created designated Provider Care Teams.  These Care Teams include your primary Cardiologist (physician) and Advanced Practice Providers (APPs -  Physician Assistants and Nurse Practitioners) who all work together to provide you with the care you need, when you need it.     Your next appointment:   12 month(s)  The format for your next appointment:   In Person  Provider:   Shelva Majestic, MD

## 2020-08-30 ENCOUNTER — Encounter: Payer: Self-pay | Admitting: Cardiovascular Disease

## 2020-09-04 DIAGNOSIS — M47812 Spondylosis without myelopathy or radiculopathy, cervical region: Secondary | ICD-10-CM | POA: Diagnosis not present

## 2020-09-04 DIAGNOSIS — G894 Chronic pain syndrome: Secondary | ICD-10-CM | POA: Diagnosis not present

## 2020-09-04 DIAGNOSIS — M25562 Pain in left knee: Secondary | ICD-10-CM | POA: Diagnosis not present

## 2020-09-04 DIAGNOSIS — Z79891 Long term (current) use of opiate analgesic: Secondary | ICD-10-CM | POA: Diagnosis not present

## 2020-09-27 DIAGNOSIS — I10 Essential (primary) hypertension: Secondary | ICD-10-CM | POA: Diagnosis not present

## 2020-10-02 DIAGNOSIS — Z79891 Long term (current) use of opiate analgesic: Secondary | ICD-10-CM | POA: Diagnosis not present

## 2020-10-02 DIAGNOSIS — M47812 Spondylosis without myelopathy or radiculopathy, cervical region: Secondary | ICD-10-CM | POA: Diagnosis not present

## 2020-10-02 DIAGNOSIS — G894 Chronic pain syndrome: Secondary | ICD-10-CM | POA: Diagnosis not present

## 2020-10-02 DIAGNOSIS — R519 Headache, unspecified: Secondary | ICD-10-CM | POA: Diagnosis not present

## 2020-10-28 DIAGNOSIS — M47812 Spondylosis without myelopathy or radiculopathy, cervical region: Secondary | ICD-10-CM | POA: Diagnosis not present

## 2020-10-28 DIAGNOSIS — R51 Headache with orthostatic component, not elsewhere classified: Secondary | ICD-10-CM | POA: Diagnosis not present

## 2020-10-28 DIAGNOSIS — Z79891 Long term (current) use of opiate analgesic: Secondary | ICD-10-CM | POA: Diagnosis not present

## 2020-10-28 DIAGNOSIS — I10 Essential (primary) hypertension: Secondary | ICD-10-CM | POA: Diagnosis not present

## 2020-10-28 DIAGNOSIS — G894 Chronic pain syndrome: Secondary | ICD-10-CM | POA: Diagnosis not present

## 2020-11-25 ENCOUNTER — Other Ambulatory Visit: Payer: Self-pay | Admitting: Internal Medicine

## 2020-11-25 DIAGNOSIS — Z139 Encounter for screening, unspecified: Secondary | ICD-10-CM

## 2020-11-27 DIAGNOSIS — I10 Essential (primary) hypertension: Secondary | ICD-10-CM | POA: Diagnosis not present

## 2020-12-15 DIAGNOSIS — M47812 Spondylosis without myelopathy or radiculopathy, cervical region: Secondary | ICD-10-CM | POA: Diagnosis not present

## 2020-12-15 DIAGNOSIS — Z79891 Long term (current) use of opiate analgesic: Secondary | ICD-10-CM | POA: Diagnosis not present

## 2020-12-15 DIAGNOSIS — G894 Chronic pain syndrome: Secondary | ICD-10-CM | POA: Diagnosis not present

## 2020-12-23 DIAGNOSIS — G894 Chronic pain syndrome: Secondary | ICD-10-CM | POA: Diagnosis not present

## 2020-12-28 DIAGNOSIS — I10 Essential (primary) hypertension: Secondary | ICD-10-CM | POA: Diagnosis not present

## 2021-01-06 ENCOUNTER — Telehealth: Payer: Self-pay | Admitting: Cardiovascular Disease

## 2021-01-06 NOTE — Telephone Encounter (Signed)
Pt c/o of Chest Pain: STAT if CP now or developed within 24 hours  1. Are you having CP right now? Yes, not as severe   2. Are you experiencing any other symptoms (ex. SOB, nausea, vomiting, sweating)? SOB not now, nausea, sweating  3. How long have you been experiencing CP? 3 days  4. Is your CP continuous or coming and going? Comes and goes, but lasts a while  5. Have you taken Nitroglycerin? Yes, took one 8am this morning   Patient states she has been having chest pain for 3 days. She states it comes and goes, but is there more than it isn't. She states she has also been getting SOB, when the pain is worse. She states she was doing well and her pain doctor cut back her medications, but she thinks they cut her back too much. She states she has not been sleeping well. She states she has been taking her metoprolol as prescribed and was told she could take a quarter of a tablet at a time if she was having the chest pain. She states her chest pain eased this morning, but she still has it and does not feel like everything is okay.

## 2021-01-06 NOTE — Telephone Encounter (Signed)
The patient called in with complaints of chest pain. She stated that she has been having horrible chest pain the last three days. The pain is a 10/10. She stated that it does not radiate. The pain occurs when she is sitting still or lying in bed. The pain causes her to become short of breath and typically lasts 3-4 hours. She stated that she was currently having chest pain but it was not as bad.   She has been advised that since she is having chest pain she should call 911 to get evaluated. She has agreed and stated that she will.

## 2021-01-09 NOTE — Telephone Encounter (Signed)
agree

## 2021-01-14 DIAGNOSIS — Z79891 Long term (current) use of opiate analgesic: Secondary | ICD-10-CM | POA: Diagnosis not present

## 2021-01-14 DIAGNOSIS — M47812 Spondylosis without myelopathy or radiculopathy, cervical region: Secondary | ICD-10-CM | POA: Diagnosis not present

## 2021-01-14 DIAGNOSIS — G894 Chronic pain syndrome: Secondary | ICD-10-CM | POA: Diagnosis not present

## 2021-01-14 DIAGNOSIS — R519 Headache, unspecified: Secondary | ICD-10-CM | POA: Diagnosis not present

## 2021-02-12 DIAGNOSIS — R519 Headache, unspecified: Secondary | ICD-10-CM | POA: Diagnosis not present

## 2021-02-12 DIAGNOSIS — M47812 Spondylosis without myelopathy or radiculopathy, cervical region: Secondary | ICD-10-CM | POA: Diagnosis not present

## 2021-02-12 DIAGNOSIS — Z79891 Long term (current) use of opiate analgesic: Secondary | ICD-10-CM | POA: Diagnosis not present

## 2021-02-12 DIAGNOSIS — G894 Chronic pain syndrome: Secondary | ICD-10-CM | POA: Diagnosis not present

## 2021-02-20 DIAGNOSIS — E782 Mixed hyperlipidemia: Secondary | ICD-10-CM | POA: Diagnosis not present

## 2021-02-20 DIAGNOSIS — E1140 Type 2 diabetes mellitus with diabetic neuropathy, unspecified: Secondary | ICD-10-CM | POA: Diagnosis not present

## 2021-02-20 DIAGNOSIS — G894 Chronic pain syndrome: Secondary | ICD-10-CM | POA: Diagnosis not present

## 2021-02-20 DIAGNOSIS — B351 Tinea unguium: Secondary | ICD-10-CM | POA: Diagnosis not present

## 2021-02-20 DIAGNOSIS — I2510 Atherosclerotic heart disease of native coronary artery without angina pectoris: Secondary | ICD-10-CM | POA: Diagnosis not present

## 2021-02-20 DIAGNOSIS — Z23 Encounter for immunization: Secondary | ICD-10-CM | POA: Diagnosis not present

## 2021-02-20 DIAGNOSIS — I10 Essential (primary) hypertension: Secondary | ICD-10-CM | POA: Diagnosis not present

## 2021-03-23 ENCOUNTER — Encounter (HOSPITAL_COMMUNITY): Payer: Self-pay

## 2021-03-23 ENCOUNTER — Emergency Department (HOSPITAL_COMMUNITY)
Admission: EM | Admit: 2021-03-23 | Discharge: 2021-03-23 | Disposition: A | Discharge status: Home / Self Care | Payer: Medicare Other | Attending: Emergency Medicine | Admitting: Emergency Medicine

## 2021-03-23 ENCOUNTER — Emergency Department (HOSPITAL_COMMUNITY): Payer: Medicare Other

## 2021-03-23 DIAGNOSIS — N289 Disorder of kidney and ureter, unspecified: Secondary | ICD-10-CM | POA: Diagnosis not present

## 2021-03-23 DIAGNOSIS — Z79899 Other long term (current) drug therapy: Secondary | ICD-10-CM | POA: Diagnosis not present

## 2021-03-23 DIAGNOSIS — Z87891 Personal history of nicotine dependence: Secondary | ICD-10-CM | POA: Insufficient documentation

## 2021-03-23 DIAGNOSIS — Z20822 Contact with and (suspected) exposure to covid-19: Secondary | ICD-10-CM | POA: Diagnosis not present

## 2021-03-23 DIAGNOSIS — N200 Calculus of kidney: Secondary | ICD-10-CM

## 2021-03-23 DIAGNOSIS — Z7984 Long term (current) use of oral hypoglycemic drugs: Secondary | ICD-10-CM | POA: Diagnosis not present

## 2021-03-23 DIAGNOSIS — I10 Essential (primary) hypertension: Secondary | ICD-10-CM | POA: Insufficient documentation

## 2021-03-23 DIAGNOSIS — R911 Solitary pulmonary nodule: Secondary | ICD-10-CM

## 2021-03-23 DIAGNOSIS — R1084 Generalized abdominal pain: Secondary | ICD-10-CM | POA: Insufficient documentation

## 2021-03-23 DIAGNOSIS — I2510 Atherosclerotic heart disease of native coronary artery without angina pectoris: Secondary | ICD-10-CM | POA: Insufficient documentation

## 2021-03-23 DIAGNOSIS — E119 Type 2 diabetes mellitus without complications: Secondary | ICD-10-CM | POA: Diagnosis not present

## 2021-03-23 DIAGNOSIS — K5900 Constipation, unspecified: Secondary | ICD-10-CM | POA: Diagnosis not present

## 2021-03-23 DIAGNOSIS — R140 Abdominal distension (gaseous): Secondary | ICD-10-CM | POA: Diagnosis not present

## 2021-03-23 DIAGNOSIS — R1032 Left lower quadrant pain: Secondary | ICD-10-CM | POA: Diagnosis not present

## 2021-03-23 LAB — URINALYSIS, ROUTINE W REFLEX MICROSCOPIC
Bilirubin Urine: NEGATIVE
Glucose, UA: NEGATIVE mg/dL
Hgb urine dipstick: NEGATIVE
Ketones, ur: NEGATIVE mg/dL
Leukocytes,Ua: NEGATIVE
Nitrite: NEGATIVE
Protein, ur: NEGATIVE mg/dL
Specific Gravity, Urine: 1.015 (ref 1.005–1.030)
pH: 7 (ref 5.0–8.0)

## 2021-03-23 LAB — CBC WITH DIFFERENTIAL/PLATELET
Abs Immature Granulocytes: 0.02 10*3/uL (ref 0.00–0.07)
Basophils Absolute: 0 10*3/uL (ref 0.0–0.1)
Basophils Relative: 1 %
Eosinophils Absolute: 0.1 10*3/uL (ref 0.0–0.5)
Eosinophils Relative: 2 %
HCT: 33 % — ABNORMAL LOW (ref 36.0–46.0)
Hemoglobin: 10.5 g/dL — ABNORMAL LOW (ref 12.0–15.0)
Immature Granulocytes: 0 %
Lymphocytes Relative: 24 %
Lymphs Abs: 1.5 10*3/uL (ref 0.7–4.0)
MCH: 28.5 pg (ref 26.0–34.0)
MCHC: 31.8 g/dL (ref 30.0–36.0)
MCV: 89.4 fL (ref 80.0–100.0)
Monocytes Absolute: 0.6 10*3/uL (ref 0.1–1.0)
Monocytes Relative: 9 %
Neutro Abs: 3.9 10*3/uL (ref 1.7–7.7)
Neutrophils Relative %: 64 %
Platelets: 283 10*3/uL (ref 150–400)
RBC: 3.69 MIL/uL — ABNORMAL LOW (ref 3.87–5.11)
RDW: 12.6 % (ref 11.5–15.5)
WBC: 6 10*3/uL (ref 4.0–10.5)
nRBC: 0 % (ref 0.0–0.2)

## 2021-03-23 LAB — COMPREHENSIVE METABOLIC PANEL
ALT: 16 U/L (ref 0–44)
AST: 19 U/L (ref 15–41)
Albumin: 4 g/dL (ref 3.5–5.0)
Alkaline Phosphatase: 61 U/L (ref 38–126)
Anion gap: 7 (ref 5–15)
BUN: 34 mg/dL — ABNORMAL HIGH (ref 6–20)
CO2: 27 mmol/L (ref 22–32)
Calcium: 9.3 mg/dL (ref 8.9–10.3)
Chloride: 101 mmol/L (ref 98–111)
Creatinine, Ser: 1.52 mg/dL — ABNORMAL HIGH (ref 0.44–1.00)
GFR, Estimated: 39 mL/min — ABNORMAL LOW (ref >60)
Glucose, Bld: 185 mg/dL — ABNORMAL HIGH (ref 70–99)
Potassium: 4.7 mmol/L (ref 3.5–5.1)
Sodium: 135 mmol/L (ref 135–145)
Total Bilirubin: 0.2 mg/dL — ABNORMAL LOW (ref 0.3–1.2)
Total Protein: 7.2 g/dL (ref 6.5–8.1)

## 2021-03-23 LAB — RESP PANEL BY RT-PCR (FLU A&B, COVID) ARPGX2
Influenza A by PCR: NEGATIVE
Influenza B by PCR: NEGATIVE
SARS Coronavirus 2 by RT PCR: NEGATIVE

## 2021-03-23 LAB — LIPASE, BLOOD: Lipase: 55 U/L — ABNORMAL HIGH (ref 11–51)

## 2021-03-23 MED ORDER — SODIUM CHLORIDE 0.9 % IV BOLUS
1000.0000 mL | Freq: Once | INTRAVENOUS | Status: AC
  Administered 2021-03-23: 1000 mL via INTRAVENOUS

## 2021-03-23 MED ORDER — IOHEXOL 350 MG/ML SOLN
100.0000 mL | Freq: Once | INTRAVENOUS | Status: AC | PRN
  Administered 2021-03-23: 75 mL via INTRAVENOUS

## 2021-03-23 MED ORDER — MORPHINE SULFATE (PF) 4 MG/ML IV SOLN
4.0000 mg | Freq: Once | INTRAVENOUS | Status: AC
  Administered 2021-03-23: 4 mg via INTRAVENOUS
  Filled 2021-03-23: qty 1

## 2021-03-23 MED ORDER — MORPHINE SULFATE (PF) 2 MG/ML IV SOLN
2.0000 mg | Freq: Once | INTRAVENOUS | Status: AC
Start: 2021-03-23 — End: 2021-03-23
  Administered 2021-03-23: 2 mg via INTRAVENOUS
  Filled 2021-03-23: qty 1
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
## 2021-03-23 NOTE — ED Provider Notes (Signed)
Patient is a 60 year old female whose care was transferred to me at shift change from [facility].  His HPI is below:  Anita [name] is a 60 yo female significant history of chronic abdominal pain, CAD, depression, diabetes, hypertension, prior stroke who presents to the ED with abdominal pain x 1 year. Pt states it has been worsening over the past 2.5 weeks and that her stomach has appeared "swollen" for the past 3 days, which prompted her to be seen. She mentions that the pain is mostly in her lower L side and is radiating to her flank. It worsens post-prandially and she has found nothing to relieve the pain. She takes percocet for her neck, as well as tylenol and motrin w/ no relief. The pain is stabbing in nature and she rates it an 8/10. She states she is still having regular BM and flatus, but has a history of chronic constipation w/ "hard" stools. She also admits to dysuria, but denies frequency or urgency. She denies any fever, NVD, CP or SOB. She admits that her main fluid intake is mountain dew and that she "does not drink any water". Her last oral intake was this morning at 8:30am.   Physical Exam  BP (!) 191/79 (BP Location: Left Arm)   Pulse 71   Temp 98.3 F (36.8 C) (Oral)   Resp 18   SpO2 98%   Physical Exam Vitals and nursing note reviewed.  Constitutional:      General: She is not in acute distress.    Appearance: She is well-developed.     Comments:  HENT:     Head: Atraumatic.  Eyes:     Conjunctiva/sclera: Conjunctivae normal.  Cardiovascular:     Rate and Rhythm: Normal rate and regular rhythm.  Pulmonary:     Effort: Pulmonary effort is normal.     Breath sounds: No wheezing or rhonchi.  Abdominal:     General: Abdomen is flat.     Palpations: Abdomen is soft.     Tenderness: There is abdominal tenderness in the left lower quadrant. There is no guarding or rebound.  Musculoskeletal:     Cervical back: Neck supple.  Skin:    Findings: No rash.  Neurological:      Mental Status: She is alert.  Psychiatric:        Mood and Affect: Mood normal.  ED Course/Procedures     Procedures  MDM  Patient is a 60 year old female whose care was transferred to me from [facility] at shift change.  Please see his note below for further information.  In summary, patient presents today due to increasing pain in the left lower quadrant for the past few days.  Lab work reassuring.  At shift change patient pending CT scan of the abdomen/pelvis.  This has been obtained with findings as noted below:  IMPRESSION:  1. Constipation.  2. Nonobstructive 8 mm left nephrolithiasis.  3. Partially visualized nodular-like density in the left lower lobe  measuring 1.4 cm. Recommend PA and lateral chest x-ray for  evaluation. Consider follow-up with CT in 3 months.   Patient given an additional dose of morphine and notes significant improvement in her symptoms.  Feel that she is stable for discharge at this time and she is agreeable.  Recommended that she continue with her home pain regimen.  We discussed the incidental pulmonary nodule found on the CT scan and she is going to follow-up with her PCP for follow-up imaging.  We discussed return precautions.  Her questions were answered and she was amicable at the time of discharge.      Rayna Sexton, PA-C 03/23/21 2032    Gareth Morgan, MD 03/23/21 2041

## 2021-03-23 NOTE — ED Provider Notes (Signed)
Emergency Medicine Provider Triage Evaluation Note  Anita [name] , a 60 y.o. female  was evaluated in triage.  Pt complains of left-sided, stabbing, flank pain that has progressively worsened over the past few months.  Patient states pain has been ongoing for the past year.  Patient had a stent which was removed a few months ago.  Denies nausea, vomiting, diarrhea.  Patient also endorses abdominal distention.  No history of kidney stones.  No trauma.  No rash to area.  Review of Systems  Positive: Abdominal pain/flank, distention Negative: Vomiting  Physical Exam  BP (!) 89/59 (BP Location: Right Arm)   Pulse 61   Temp 98.3 F (36.8 C) (Oral)   Resp 18   SpO2 95%  Gen:   Awake, no distress   Resp:  Normal effort  MSK:   Moves extremities without difficulty  Other:  Diffuse tenderness  Medical Decision Making  Medically screening exam initiated at 1:32 PM.  Appropriate orders placed.  Anita [name] was informed that the remainder of the evaluation will be completed by another provider, this initial triage assessment does not replace that evaluation, and the importance of remaining in the ED until their evaluation is complete.  Patient originally hypotensive in triage; however, upon recheck BP improved to 112/70. No hematemesis or melena. She endorses some rectal bleeding from known hemorrhoid   Abdominal labs CT abdomen   Anita Bouchard, PA-C 03/23/21 1335    Horton, Alvin Critchley, DO 03/23/21 1509

## 2021-03-23 NOTE — Discharge Instructions (Addendum)
If you develop any new or worsening symptoms please come back to the emergency department immediately for reevaluation.  Please follow-up with your regular doctor regarding the pulmonary nodule that we found on your CT scan.  We recommend follow-up imaging in 3 months.

## 2021-03-23 NOTE — ED Triage Notes (Signed)
Pt presents with c/o abdominal pain. Pt has a hx of abdominal issues, reports her abdomen has been hurting for over a year. Pt is hypotensive in triage.

## 2021-04-02 DIAGNOSIS — B351 Tinea unguium: Secondary | ICD-10-CM | POA: Diagnosis not present

## 2021-04-02 DIAGNOSIS — E782 Mixed hyperlipidemia: Secondary | ICD-10-CM | POA: Diagnosis not present

## 2021-04-02 DIAGNOSIS — E538 Deficiency of other specified B group vitamins: Secondary | ICD-10-CM | POA: Diagnosis not present

## 2021-04-02 DIAGNOSIS — Z6821 Body mass index (BMI) 21.0-21.9, adult: Secondary | ICD-10-CM | POA: Diagnosis not present

## 2021-04-02 DIAGNOSIS — E559 Vitamin D deficiency, unspecified: Secondary | ICD-10-CM | POA: Diagnosis not present

## 2021-04-02 DIAGNOSIS — E1140 Type 2 diabetes mellitus with diabetic neuropathy, unspecified: Secondary | ICD-10-CM | POA: Diagnosis not present

## 2021-04-02 DIAGNOSIS — R319 Hematuria, unspecified: Secondary | ICD-10-CM | POA: Diagnosis not present

## 2021-04-02 DIAGNOSIS — Z0001 Encounter for general adult medical examination with abnormal findings: Secondary | ICD-10-CM | POA: Diagnosis not present

## 2021-04-02 DIAGNOSIS — N200 Calculus of kidney: Secondary | ICD-10-CM | POA: Diagnosis not present

## 2021-04-02 DIAGNOSIS — I10 Essential (primary) hypertension: Secondary | ICD-10-CM | POA: Diagnosis not present

## 2021-04-02 DIAGNOSIS — G894 Chronic pain syndrome: Secondary | ICD-10-CM | POA: Diagnosis not present

## 2021-04-06 ENCOUNTER — Encounter (INDEPENDENT_AMBULATORY_CARE_PROVIDER_SITE_OTHER): Payer: Self-pay | Admitting: *Deleted

## 2021-04-06 DIAGNOSIS — K625 Hemorrhage of anus and rectum: Secondary | ICD-10-CM | POA: Diagnosis not present

## 2021-04-06 DIAGNOSIS — R310 Gross hematuria: Secondary | ICD-10-CM | POA: Diagnosis not present

## 2021-04-06 DIAGNOSIS — N200 Calculus of kidney: Secondary | ICD-10-CM | POA: Diagnosis not present

## 2021-04-16 ENCOUNTER — Encounter (INDEPENDENT_AMBULATORY_CARE_PROVIDER_SITE_OTHER): Payer: Self-pay | Admitting: Gastroenterology

## 2021-04-16 ENCOUNTER — Ambulatory Visit (INDEPENDENT_AMBULATORY_CARE_PROVIDER_SITE_OTHER): Payer: Medicare Other | Admitting: Gastroenterology

## 2021-04-16 ENCOUNTER — Other Ambulatory Visit: Payer: Self-pay

## 2021-04-16 VITALS — BP 104/65 | HR 54 | Temp 97.4°F | Ht 65.0 in | Wt 134.2 lb

## 2021-04-16 DIAGNOSIS — R1900 Intra-abdominal and pelvic swelling, mass and lump, unspecified site: Secondary | ICD-10-CM | POA: Diagnosis not present

## 2021-04-16 DIAGNOSIS — R1032 Left lower quadrant pain: Secondary | ICD-10-CM

## 2021-04-16 DIAGNOSIS — K625 Hemorrhage of anus and rectum: Secondary | ICD-10-CM | POA: Diagnosis not present

## 2021-04-16 DIAGNOSIS — K5900 Constipation, unspecified: Secondary | ICD-10-CM

## 2021-04-16 NOTE — Patient Instructions (Addendum)
We will proceed with a CT of your abdomen to look at issues with blood flow. If this is negative, we will proceed with an EGD and colonoscopy for further evaluation of your symptoms. You may restart miralax for your constipation and please drink plenty of water, at least eight 8 oz glasses per day. If you develop worsening dizziness, shortness of breath or fatigue please let me know.
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
## 2021-04-20 ENCOUNTER — Other Ambulatory Visit: Payer: Self-pay

## 2021-04-20 ENCOUNTER — Ambulatory Visit (HOSPITAL_BASED_OUTPATIENT_CLINIC_OR_DEPARTMENT_OTHER)
Admission: RE | Admit: 2021-04-20 | Discharge: 2021-04-20 | Disposition: A | Discharge status: Home / Self Care | Payer: Medicare Other | Source: Ambulatory Visit | Attending: Gastroenterology | Admitting: Gastroenterology

## 2021-04-20 ENCOUNTER — Encounter (HOSPITAL_BASED_OUTPATIENT_CLINIC_OR_DEPARTMENT_OTHER): Payer: Self-pay

## 2021-04-20 DIAGNOSIS — R1900 Intra-abdominal and pelvic swelling, mass and lump, unspecified site: Secondary | ICD-10-CM

## 2021-04-20 DIAGNOSIS — R1032 Left lower quadrant pain: Secondary | ICD-10-CM | POA: Diagnosis not present

## 2021-04-20 DIAGNOSIS — N200 Calculus of kidney: Secondary | ICD-10-CM | POA: Diagnosis not present

## 2021-04-20 DIAGNOSIS — K625 Hemorrhage of anus and rectum: Secondary | ICD-10-CM

## 2021-04-20 LAB — POCT I-STAT CREATININE: Creatinine, Ser: 1.3 mg/dL — ABNORMAL HIGH (ref 0.44–1.00)

## 2021-04-20 MED ORDER — IOHEXOL 350 MG/ML SOLN
80.0000 mL | Freq: Once | INTRAVENOUS | Status: AC | PRN
  Administered 2021-04-20: 80 mL via INTRAVENOUS

## 2021-04-21 DIAGNOSIS — G894 Chronic pain syndrome: Secondary | ICD-10-CM | POA: Diagnosis not present

## 2021-04-21 DIAGNOSIS — E1140 Type 2 diabetes mellitus with diabetic neuropathy, unspecified: Secondary | ICD-10-CM | POA: Diagnosis not present

## 2021-04-22 ENCOUNTER — Encounter (INDEPENDENT_AMBULATORY_CARE_PROVIDER_SITE_OTHER): Payer: Self-pay

## 2021-04-22 ENCOUNTER — Other Ambulatory Visit (INDEPENDENT_AMBULATORY_CARE_PROVIDER_SITE_OTHER): Payer: Self-pay

## 2021-04-22 DIAGNOSIS — K625 Hemorrhage of anus and rectum: Secondary | ICD-10-CM

## 2021-04-22 DIAGNOSIS — K921 Melena: Secondary | ICD-10-CM

## 2021-04-22 DIAGNOSIS — R1310 Dysphagia, unspecified: Secondary | ICD-10-CM

## 2021-04-27 ENCOUNTER — Telehealth (INDEPENDENT_AMBULATORY_CARE_PROVIDER_SITE_OTHER): Payer: Self-pay | Admitting: *Deleted

## 2021-04-27 NOTE — Telephone Encounter (Signed)
Called and discussed with pt. Pt verbalized understanding.  

## 2021-04-27 NOTE — Telephone Encounter (Signed)
Pt's daughter Nira Conn called. She has a business where she flushes colon with water to clean out colon. Her mother is having a colonoscopy with you this Wednesday. States her mother wants her to flush her colon instead of doing prep. She is worried she is so impacted that the prep will not clean her out. Daughter has a concern about doing the flush since she is having rectal bleeding.wants to know if its ok to do the flush instead of the prep.   [phone]

## 2021-04-27 NOTE — Telephone Encounter (Signed)
She can do the flushing if she wishes (there is a risk she may cause trauma with the flushing, so it would not be my recommendation), but she needs to take the bowl prep regardless.

## 2021-04-28 ENCOUNTER — Encounter (INDEPENDENT_AMBULATORY_CARE_PROVIDER_SITE_OTHER): Payer: Self-pay

## 2021-04-29 ENCOUNTER — Other Ambulatory Visit: Payer: Self-pay

## 2021-04-29 ENCOUNTER — Encounter (HOSPITAL_COMMUNITY)
Admission: RE | Admit: 2021-04-29 | Discharge: 2021-04-29 | Disposition: A | Discharge status: Home / Self Care | Payer: Medicare Other | Source: Ambulatory Visit | Attending: Gastroenterology | Admitting: Gastroenterology

## 2021-04-29 ENCOUNTER — Encounter (HOSPITAL_COMMUNITY): Payer: Self-pay

## 2021-04-29 NOTE — Patient Instructions (Signed)
Anita [name]  04/29/2021     @PREFPERIOPPHARMACY @   Your procedure is scheduled on 05/01/2021.   Report to [facility] at  [place]  A.M.   Call this number if you have problems the morning of surgery:  [phone]   Remember:  Follow the diet and prep instructions given to you by the office.    Take these medicines the morning of surgery with A SIP OF WATER       xanax (if needed), gabapentin, methadone or oxycodone (if needed), metoprolol.     Do not wear jewelry, make-up or nail polish.  Do not wear lotions, powders, or perfumes, or deodorant.  Do not shave 48 hours prior to surgery.  Men may shave face and neck.  Do not bring valuables to the hospital.  [facility] is not responsible for any belongings or valuables.  Contacts, dentures or bridgework may not be worn into surgery.  Leave your suitcase in the car.  After surgery it may be brought to your room.  For patients admitted to the hospital, discharge time will be determined by your treatment team.  Patients discharged the day of surgery will not be allowed to drive home and must have someone with them for 24 hours.    Special instructions:   DO NOT smoke tobacco or vape for 24 hours before your procedure.  Please read over the following fact sheets that you were given. Anesthesia Post-op Instructions and Care and Recovery After Surgery      Upper Endoscopy, Adult, Care After This sheet gives you information about how to care for yourself after your procedure. Your health care provider may also give you more specific instructions. If you have problems or questions, contact your health care provider. What can I expect after the procedure? After the procedure, it is common to have: A sore throat. Mild stomach pain or discomfort. Bloating. Nausea. Follow these instructions at home:  Follow instructions from your health care provider about what to eat or drink after your procedure. Return to your  normal activities as told by your health care provider. Ask your health care provider what activities are safe for you. Take over-the-counter and prescription medicines only as told by your health care provider. If you were given a sedative during the procedure, it can affect you for several hours. Do not drive or operate machinery until your health care provider says that it is safe. Keep all follow-up visits as told by your health care provider. This is important. Contact a health care provider if you have: A sore throat that lasts longer than one day. Trouble swallowing. Get help right away if: You vomit blood or your vomit looks like coffee grounds. You have: A fever. Bloody, black, or tarry stools. A severe sore throat or you cannot swallow. Difficulty breathing. Severe pain in your chest or abdomen. Summary After the procedure, it is common to have a sore throat, mild stomach discomfort, bloating, and nausea. If you were given a sedative during the procedure, it can affect you for several hours. Do not drive or operate machinery until your health care provider says that it is safe. Follow instructions from your health care provider about what to eat or drink after your procedure. Return to your normal activities as told by your health care provider. This information is not intended to replace advice given to you by your health care provider. Make sure you discuss any questions you  have with your health care provider. Document Revised: 03/23/2019 Document Reviewed: 10/17/2017 Elsevier Patient Education  2022 [place]. Colonoscopy, Adult, Care After This sheet gives you information about how to care for yourself after your procedure. Your health care provider may also give you more specific instructions. If you have problems or questions, contact your health care provider. What can I expect after the procedure? After the procedure, it is common to have: A small amount of blood in  your stool for 24 hours after the procedure. Some gas. Mild cramping or bloating of your abdomen. Follow these instructions at home: Eating and drinking  Drink enough fluid to keep your urine pale yellow. Follow instructions from your health care provider about eating or drinking restrictions. Resume your normal diet as instructed by your health care provider. Avoid heavy or fried foods that are hard to digest. Activity Rest as told by your health care provider. Avoid sitting for a long time without moving. Get up to take short walks every 1-2 hours. This is important to improve blood flow and breathing. Ask for help if you feel weak or unsteady. Return to your normal activities as told by your health care provider. Ask your health care provider what activities are safe for you. Managing cramping and bloating  Try walking around when you have cramps or feel bloated. Apply heat to your abdomen as told by your health care provider. Use the heat source that your health care provider recommends, such as a moist heat pack or a heating pad. Place a towel between your skin and the heat source. Leave the heat on for 20-30 minutes. Remove the heat if your skin turns bright red. This is especially important if you are unable to feel pain, heat, or cold. You may have a greater risk of getting burned. General instructions If you were given a sedative during the procedure, it can affect you for several hours. Do not drive or operate machinery until your health care provider says that it is safe. For the first 24 hours after the procedure: Do not sign important documents. Do not drink alcohol. Do your regular daily activities at a slower pace than normal. Eat soft foods that are easy to digest. Take over-the-counter and prescription medicines only as told by your health care provider. Keep all follow-up visits as told by your health care provider. This is important. Contact a health care provider  if: You have blood in your stool 2-3 days after the procedure. Get help right away if you have: More than a small spotting of blood in your stool. Large blood clots in your stool. Swelling of your abdomen. Nausea or vomiting. A fever. Increasing pain in your abdomen that is not relieved with medicine. Summary After the procedure, it is common to have a small amount of blood in your stool. You may also have mild cramping and bloating of your abdomen. If you were given a sedative during the procedure, it can affect you for several hours. Do not drive or operate machinery until your health care provider says that it is safe. Get help right away if you have a lot of blood in your stool, nausea or vomiting, a fever, or increased pain in your abdomen. This information is not intended to replace advice given to you by your health care provider. Make sure you discuss any questions you have with your health care provider. Document Revised: 03/23/2019 Document Reviewed: 12/11/2018 Elsevier Patient Education  [place],  Care After This sheet gives you information about how to care for yourself after your procedure. Your health care provider may also give you more specific instructions. If you have problems or questions, contact your health care provider. What can I expect after the procedure? After the procedure, it is common to have: Tiredness. Forgetfulness about what happened after the procedure. Impaired judgment for important decisions. Nausea or vomiting. Some difficulty with balance. Follow these instructions at home: For the time period you were told by your health care provider:   Rest as needed. Do not participate in activities where you could fall or become injured. Do not drive or use machinery. Do not drink alcohol. Do not take sleeping pills or medicines that cause drowsiness. Do not make important decisions or sign legal documents. Do not take  care of children on your own. Eating and drinking Follow the diet that is recommended by your health care provider. Drink enough fluid to keep your urine pale yellow. If you vomit: Drink water, juice, or soup when you can drink without vomiting. Make sure you have little or no nausea before eating solid foods. General instructions Have a responsible adult stay with you for the time you are told. It is important to have someone help care for you until you are awake and alert. Take over-the-counter and prescription medicines only as told by your health care provider. If you have sleep apnea, surgery and certain medicines can increase your risk for breathing problems. Follow instructions from your health care provider about wearing your sleep device: Anytime you are sleeping, including during daytime naps. While taking prescription pain medicines, sleeping medicines, or medicines that make you drowsy. Avoid smoking. Keep all follow-up visits as told by your health care provider. This is important. Contact a health care provider if: You keep feeling nauseous or you keep vomiting. You feel light-headed. You are still sleepy or having trouble with balance after 24 hours. You develop a rash. You have a fever. You have redness or swelling around the IV site. Get help right away if: You have trouble breathing. You have new-onset confusion at home. Summary For several hours after your procedure, you may feel tired. You may also be forgetful and have poor judgment. Have a responsible adult stay with you for the time you are told. It is important to have someone help care for you until you are awake and alert. Rest as told. Do not drive or operate machinery. Do not drink alcohol or take sleeping pills. Get help right away if you have trouble breathing, or if you suddenly become confused. This information is not intended to replace advice given to you by your health care provider. Make sure you  discuss any questions you have with your health care provider. Document Revised: 01/31/2020 Document Reviewed: 04/19/2019 Elsevier Patient Education  2022 [employer].

## 2021-05-01 ENCOUNTER — Ambulatory Visit (HOSPITAL_COMMUNITY)
Admission: RE | Admit: 2021-05-01 | Discharge: 2021-05-01 | Disposition: A | Discharge status: Home / Self Care | Payer: Medicare Other | Attending: Gastroenterology | Admitting: Gastroenterology

## 2021-05-01 ENCOUNTER — Ambulatory Visit (HOSPITAL_COMMUNITY): Payer: Medicare Other | Admitting: Anesthesiology

## 2021-05-01 ENCOUNTER — Encounter (HOSPITAL_COMMUNITY): Payer: Self-pay | Admitting: Gastroenterology

## 2021-05-01 ENCOUNTER — Encounter (HOSPITAL_COMMUNITY)
Admission: RE | Disposition: A | Discharge status: Home / Self Care | Payer: Self-pay | Source: Home / Self Care | Attending: Gastroenterology

## 2021-05-01 DIAGNOSIS — K259 Gastric ulcer, unspecified as acute or chronic, without hemorrhage or perforation: Secondary | ICD-10-CM | POA: Insufficient documentation

## 2021-05-01 DIAGNOSIS — K6289 Other specified diseases of anus and rectum: Secondary | ICD-10-CM

## 2021-05-01 DIAGNOSIS — K625 Hemorrhage of anus and rectum: Secondary | ICD-10-CM

## 2021-05-01 DIAGNOSIS — K2970 Gastritis, unspecified, without bleeding: Secondary | ICD-10-CM | POA: Insufficient documentation

## 2021-05-01 DIAGNOSIS — I25119 Atherosclerotic heart disease of native coronary artery with unspecified angina pectoris: Secondary | ICD-10-CM | POA: Diagnosis not present

## 2021-05-01 DIAGNOSIS — Z87891 Personal history of nicotine dependence: Secondary | ICD-10-CM | POA: Insufficient documentation

## 2021-05-01 DIAGNOSIS — K626 Ulcer of anus and rectum: Secondary | ICD-10-CM | POA: Diagnosis not present

## 2021-05-01 DIAGNOSIS — I2510 Atherosclerotic heart disease of native coronary artery without angina pectoris: Secondary | ICD-10-CM | POA: Diagnosis not present

## 2021-05-01 DIAGNOSIS — I10 Essential (primary) hypertension: Secondary | ICD-10-CM | POA: Diagnosis not present

## 2021-05-01 DIAGNOSIS — D490 Neoplasm of unspecified behavior of digestive system: Secondary | ICD-10-CM | POA: Diagnosis not present

## 2021-05-01 DIAGNOSIS — K622 Anal prolapse: Secondary | ICD-10-CM | POA: Diagnosis not present

## 2021-05-01 DIAGNOSIS — K319 Disease of stomach and duodenum, unspecified: Secondary | ICD-10-CM | POA: Diagnosis not present

## 2021-05-01 DIAGNOSIS — R1310 Dysphagia, unspecified: Secondary | ICD-10-CM | POA: Insufficient documentation

## 2021-05-01 DIAGNOSIS — K921 Melena: Secondary | ICD-10-CM

## 2021-05-01 HISTORY — PX: FLEXIBLE SIGMOIDOSCOPY: SHX5431

## 2021-05-01 HISTORY — PX: ESOPHAGOGASTRODUODENOSCOPY (EGD) WITH PROPOFOL: SHX5813

## 2021-05-01 HISTORY — PX: BIOPSY: SHX5522

## 2021-05-01 LAB — GLUCOSE, CAPILLARY: Glucose-Capillary: 151 mg/dL — ABNORMAL HIGH (ref 70–99)

## 2021-05-01 SURGERY — ESOPHAGOGASTRODUODENOSCOPY (EGD) WITH PROPOFOL
Anesthesia: General

## 2021-05-01 MED ORDER — LACTATED RINGERS IV SOLN
INTRAVENOUS | Status: DC
  Administered 2021-05-01: 1000 mL via INTRAVENOUS

## 2021-05-01 MED ORDER — PROPOFOL 10 MG/ML IV BOLUS
INTRAVENOUS | Status: DC | PRN
  Administered 2021-05-01: 10 mg via INTRAVENOUS
  Administered 2021-05-01: 50 mg via INTRAVENOUS

## 2021-05-01 MED ORDER — PROPOFOL 500 MG/50ML IV EMUL
INTRAVENOUS | Status: DC | PRN
  Administered 2021-05-01: 150 ug/kg/min via INTRAVENOUS

## 2021-05-01 MED ORDER — LIDOCAINE HCL (CARDIAC) PF 100 MG/5ML IV SOSY
PREFILLED_SYRINGE | INTRAVENOUS | Status: DC | PRN
  Administered 2021-05-01: 60 mg via INTRAVENOUS

## 2021-05-01 NOTE — Discharge Instructions (Addendum)
You are being discharged to home.  Resume your previous diet.  We are waiting for your pathology results.  Take Prilosec (omeprazole) 40 mg by mouth twice a day for three months.  Your physician has recommended a repeat upper endoscopy in three months for surveillance.  Reschedule repeat colonoscopy in next available due to poor prep - will need two day prep and be on clear liquids with Miralax 1 capful daily for 5 days.

## 2021-05-01 NOTE — Anesthesia Postprocedure Evaluation (Signed)
Anesthesia Post Note  Patient: Anita [name]  Procedure(s) Performed: ESOPHAGOGASTRODUODENOSCOPY (EGD) WITH PROPOFOL BIOPSY FLEXIBLE SIGMOIDOSCOPY  Patient location during evaluation: Phase II Anesthesia Type: General Level of consciousness: awake Pain management: pain level controlled Vital Signs Assessment: post-procedure vital signs reviewed and stable Respiratory status: spontaneous breathing and respiratory function stable Cardiovascular status: blood pressure returned to baseline and stable Postop Assessment: no headache and no apparent nausea or vomiting Anesthetic complications: no Comments: Late entry   No notable events documented.   Last Vitals:  Vitals:   05/01/21 1110 05/01/21 1252  BP: (!) 188/71 (!) 131/55  Pulse:  66  Resp: 14 15  Temp: 36.9 C 36.4 C  SpO2: 96% 97%    Last Pain:  Vitals:   05/01/21 1252  TempSrc: Oral  PainSc: 0-No pain                 [name]

## 2021-05-01 NOTE — Op Note (Signed)
[facility] Patient Name: Anita [name] Procedure Date: 05/01/2021 11:50 AM MRN: [id number] Date of Birth: [birth year] Attending MD: Maylon Peppers ,  CSN: [id number] Age: 60 Admit Type: Outpatient Procedure:                Flexible Sigmoidoscopy Indications:              Rectal hemorrhage Providers:                Maylon Peppers, Janeece Riggers, RN, Thomas Hoff., Technician Referring MD:              Medicines:                Monitored Anesthesia Care Complications:            No immediate complications. Estimated Blood Loss:     Estimated blood loss: none. Procedure:                Pre-Anesthesia Assessment:                           - Prior to the procedure, a History and Physical                            was performed, and patient medications, allergies                            and sensitivities were reviewed. The patient's                            tolerance of previous anesthesia was reviewed.                           - The risks and benefits of the procedure and the                            sedation options and risks were discussed with the                            patient. All questions were answered and informed                            consent was obtained.                           - ASA Grade Assessment: III - A patient with severe                            systemic disease.                           After obtaining informed consent, the scope was                            passed under direct vision. The PCF-HQ190L                            (  [id number]) was introduced through the anus and                            advanced to the the descending colon. After                            obtaining informed consent, the scope was passed                            under direct vision.The flexible sigmoidoscopy was                            extremely difficult due to poor bowel prep with                            stool present.  The patient tolerated the procedure                            well. Scope In: 12:38:13 PM Scope Out: 12:41:07 PM Total Procedure Duration: 0 hours 2 minutes 54 seconds  Findings:      The digital rectal exam revealed a 1 cm (diameter) rubbery-textured and       soft anal mass. The mass was non-circumferential and located       predominantly at the 6-o'clock position (with respect to the       circumference of the bowel).      A large amount of stool was found in the rectum, in the sigmoid colon       and in the descending colon, precluding visualization.      The retroflexed view of the distal rectum and anal verge was normal and       showed no anal or rectal abnormalities.      A fungating non-obstructing mass was found at the anus - possible       ulcerated condyloma?Marland Kitchen The mass measured one cm in length. No bleeding       was present. This was biopsied with a cold forceps for histology. Impression:               - Anal mass.                           - Stool in the rectum, in the sigmoid colon and in                            the descending colon.                           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 Biopsied. Moderate Sedation:      Per Anesthesia Care Recommendation:           - Discharge patient to home (ambulatory).                           - Resume previous diet.                           - Await pathology results.                           -  Reschedule repeat colonoscopy in next available                            due to poor prep - will need two day prep and be on                            clear liquids with Miralax 1 capful daily for 5                            days. Procedure Code(s):        --- Professional ---                           [id number], Sigmoidoscopy, flexible; with biopsy, single                            or multiple Diagnosis Code(s):        --- Professional ---                           K62.89, Other specified diseases of anus and rectum                            D49.0, Neoplasm of unspecified behavior of                            digestive system                           K62.5, Hemorrhage of anus and rectum CPT copyright 2019 American Medical Association. All rights reserved. The codes documented in this report are preliminary and upon coder review may  be revised to meet current compliance requirements. Maylon Peppers, MD Maylon Peppers,  05/01/2021 12:59:39 PM This report has been signed electronically. Number of Addenda: 0

## 2021-05-01 NOTE — Op Note (Signed)
[facility] Patient Name: Anita [name] Procedure Date: 05/01/2021 11:48 AM MRN: [id number] Date of Birth: 04/22/1961 Attending MD: Maylon Peppers ,  CSN: [id number] Age: 60 Admit Type: Outpatient Procedure:                Upper GI endoscopy Indications:              Dysphagia Providers:                Maylon Peppers, Janeece Riggers, RN, Jeanann Lewandowsky. Sharon Seller, RN, Thomas Hoff., Technician Referring MD:              Medicines:                Monitored Anesthesia Care Complications:            No immediate complications. Estimated Blood Loss:     Estimated blood loss: none. Procedure:                Pre-Anesthesia Assessment:                           - Prior to the procedure, a History and Physical                            was performed, and patient medications, allergies                            and sensitivities were reviewed. The patient's                            tolerance of previous anesthesia was reviewed.                           - The risks and benefits of the procedure and the                            sedation options and risks were discussed with the                            patient. All questions were answered and informed                            consent was obtained.                           - ASA Grade Assessment: III - A patient with severe                            systemic disease.                           After obtaining informed consent, the endoscope was                            passed under direct vision. Throughout the  procedure, the patient's blood pressure, pulse, and                            oxygen saturations were monitored continuously. The                            [id number]-H190 ([id number]) scope was introduced through the                            mouth, and advanced to the second part of duodenum.                            The upper GI endoscopy was accomplished without                             difficulty. The patient tolerated the procedure                            well. Scope In: 12:10:54 PM Scope Out: 12:28:16 PM Total Procedure Duration: 0 hours 17 minutes 22 seconds  Findings:      No endoscopic abnormality was evident in the esophagus to explain the       patient's complaint of dysphagia. It was decided, however, to proceed       with dilation of the entire esophagus. A guidewire was placed and the       scope was withdrawn. Dilation was performed with a Savary dilator with       mild resistance at 18 mm.      Two non-bleeding cratered gastric ulcers with a clean ulcer base       (Forrest Class III) were found in the gastric antrum. The largest lesion       was 8 mm in largest dimension and has a eschar. Biopsies from the ulcer       edges were taken with a cold forceps for histology. Biopsies were taken       from the body and antrum with a cold forceps for Helicobacter pylori       testing.      Localized moderate inflammation characterized by erythema and linear       erosions was found in the entire examined stomach.      The examined duodenum was normal. Impression:               - No endoscopic esophageal abnormality to explain                            patient's dysphagia. Esophagus dilated. Dilated.                           - Non-bleeding gastric ulcers with a clean ulcer                            base (Forrest Class III). Biopsied.                           - Gastritis.                           -  Normal examined duodenum. Moderate Sedation:      Per Anesthesia Care Recommendation:           - Discharge patient to home (ambulatory).                           - Resume previous diet.                           - Await pathology results.                           - Use Prilosec (omeprazole) 40 mg PO BID for 3                            months.                           - Repeat upper endoscopy in 3 months for                             surveillance. Procedure Code(s):        --- Professional ---                           [phone], Esophagogastroduodenoscopy, flexible,                            transoral; with insertion of guide wire followed by                            passage of dilator(s) through esophagus over guide                            wire                           43239, 52, Esophagogastroduodenoscopy, flexible,                            transoral; with biopsy, single or multiple Diagnosis Code(s):        --- Professional ---                           R13.10, Dysphagia, unspecified                           K25.9, Gastric ulcer, unspecified as acute or                            chronic, without hemorrhage or perforation                           K29.70, Gastritis, unspecified, without bleeding CPT copyright 2019 American Medical Association. All rights reserved. The codes documented in this report are preliminary and upon coder review may  be revised to meet current compliance requirements. Maylon Peppers, MD Maylon Peppers,  05/01/2021 12:51:44 PM This report has been signed electronically.  Number of Addenda: 0 

## 2021-05-01 NOTE — Transfer of Care (Signed)
Immediate Anesthesia Transfer of Care Note  Patient: Anita [name]  Procedure(s) Performed: ESOPHAGOGASTRODUODENOSCOPY (EGD) WITH PROPOFOL BIOPSY FLEXIBLE SIGMOIDOSCOPY  Patient Location: PACU  Anesthesia Type:General  Level of Consciousness: awake, alert , oriented and patient cooperative  Airway & Oxygen Therapy: Patient Spontanous Breathing  Post-op Assessment: Report given to RN, Post -op Vital signs reviewed and stable and Patient moving all extremities X 4  Post vital signs: Reviewed and stable  Last Vitals:  Vitals Value Taken Time  BP    Temp    Pulse    Resp    SpO2      Last Pain:  Vitals:   05/01/21 1209  TempSrc:   PainSc: 8       Patients Stated Pain Goal: 7 (48/30/15 9968)  Complications: No notable events documented.

## 2021-05-01 NOTE — Interval H&P Note (Signed)
History and Physical Interval Note:  05/01/2021 12:07 PM  Anita [name]  has presented today for surgery, with the diagnosis of Rectal Bleeding Dysphagia & Melena.  The various methods of treatment have been discussed with the patient and family. After consideration of risks, benefits and other options for treatment, the patient has consented to  Procedure(s) with comments: COLONOSCOPY WITH PROPOFOL (N/A) - 12:30 ESOPHAGOGASTRODUODENOSCOPY (EGD) WITH PROPOFOL (N/A) as a surgical intervention.  The patient's history has been reviewed, patient examined, no change in status, stable for surgery.  I have reviewed the patient's chart and labs.  Questions were answered to the patient's satisfaction.     Maylon Peppers Mayorga

## 2021-05-01 NOTE — Anesthesia Preprocedure Evaluation (Signed)
Anesthesia Evaluation  Patient identified by MRN, date of birth, ID band Patient awake    Reviewed: Allergy & Precautions, H&P , NPO status , Patient's Chart, lab work & pertinent test results, reviewed documented beta blocker date and time   Airway Mallampati: II  TM Distance: >3 FB Neck ROM: full    Dental no notable dental hx. (+) Teeth Intact   Pulmonary shortness of breath, pneumonia, Patient abstained from smoking., former smoker,    Pulmonary exam normal breath sounds clear to auscultation       Cardiovascular Exercise Tolerance: Good hypertension, + angina + CAD  + Valvular Problems/Murmurs  Rhythm:regular Rate:Normal     Neuro/Psych  Headaches, PSYCHIATRIC DISORDERS Anxiety Depression  Neuromuscular disease CVA, Residual Symptoms    GI/Hepatic negative GI ROS, Neg liver ROS,   Endo/Other  negative endocrine ROSdiabetes, Well Controlled, Type 2  Renal/GU negative Renal ROS  negative genitourinary   Musculoskeletal   Abdominal   Peds  Hematology  (+) Blood dyscrasia, anemia ,   Anesthesia Other Findings   Reproductive/Obstetrics negative OB ROS                             Anesthesia Physical Anesthesia Plan  ASA: 3  Anesthesia Plan: General   Post-op Pain Management:    Induction:   PONV Risk Score and Plan: Propofol infusion  Airway Management Planned:   Additional Equipment:   Intra-op Plan:   Post-operative Plan:   Informed Consent: I have reviewed the patients History and Physical, chart, labs and discussed the procedure including the risks, benefits and alternatives for the proposed anesthesia with the patient or authorized representative who has indicated his/her understanding and acceptance.     Dental Advisory Given  Plan Discussed with: CRNA  Anesthesia Plan Comments:         Anesthesia Quick Evaluation

## 2021-05-04 ENCOUNTER — Ambulatory Visit (INDEPENDENT_AMBULATORY_CARE_PROVIDER_SITE_OTHER): Payer: Medicare Other | Admitting: Gastroenterology

## 2021-05-05 LAB — SURGICAL PATHOLOGY

## 2021-05-06 ENCOUNTER — Encounter (HOSPITAL_COMMUNITY): Payer: Self-pay | Admitting: Gastroenterology

## 2021-05-12 NOTE — Telephone Encounter (Signed)
error 

## 2021-05-20 DIAGNOSIS — E782 Mixed hyperlipidemia: Secondary | ICD-10-CM | POA: Diagnosis not present

## 2021-05-20 DIAGNOSIS — Z6821 Body mass index (BMI) 21.0-21.9, adult: Secondary | ICD-10-CM | POA: Diagnosis not present

## 2021-05-20 DIAGNOSIS — S0990XA Unspecified injury of head, initial encounter: Secondary | ICD-10-CM | POA: Diagnosis not present

## 2021-05-20 DIAGNOSIS — E039 Hypothyroidism, unspecified: Secondary | ICD-10-CM | POA: Diagnosis not present

## 2021-05-20 DIAGNOSIS — R55 Syncope and collapse: Secondary | ICD-10-CM | POA: Diagnosis not present

## 2021-05-20 DIAGNOSIS — I10 Essential (primary) hypertension: Secondary | ICD-10-CM | POA: Diagnosis not present

## 2021-05-20 DIAGNOSIS — E1140 Type 2 diabetes mellitus with diabetic neuropathy, unspecified: Secondary | ICD-10-CM | POA: Diagnosis not present

## 2021-05-29 DIAGNOSIS — I10 Essential (primary) hypertension: Secondary | ICD-10-CM | POA: Diagnosis not present

## 2021-06-09 ENCOUNTER — Other Ambulatory Visit (HOSPITAL_COMMUNITY): Payer: Self-pay | Admitting: Internal Medicine

## 2021-06-09 ENCOUNTER — Other Ambulatory Visit: Payer: Self-pay | Admitting: Internal Medicine

## 2021-06-09 DIAGNOSIS — S0990XA Unspecified injury of head, initial encounter: Secondary | ICD-10-CM

## 2021-06-09 DIAGNOSIS — G44309 Post-traumatic headache, unspecified, not intractable: Secondary | ICD-10-CM

## 2021-06-12 ENCOUNTER — Other Ambulatory Visit: Payer: Self-pay

## 2021-06-12 ENCOUNTER — Ambulatory Visit (HOSPITAL_COMMUNITY)
Admission: RE | Admit: 2021-06-12 | Discharge: 2021-06-12 | Disposition: A | Discharge status: Home / Self Care | Payer: Medicare Other | Source: Ambulatory Visit | Attending: Internal Medicine | Admitting: Internal Medicine

## 2021-06-12 DIAGNOSIS — S0990XA Unspecified injury of head, initial encounter: Secondary | ICD-10-CM | POA: Diagnosis not present

## 2021-06-12 DIAGNOSIS — R519 Headache, unspecified: Secondary | ICD-10-CM | POA: Diagnosis not present

## 2021-06-12 DIAGNOSIS — G44309 Post-traumatic headache, unspecified, not intractable: Secondary | ICD-10-CM | POA: Diagnosis not present

## 2021-06-19 ENCOUNTER — Ambulatory Visit: Payer: Medicare Other | Admitting: Adult Health
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
## 2021-06-30 DIAGNOSIS — I10 Essential (primary) hypertension: Secondary | ICD-10-CM | POA: Diagnosis not present

## 2021-07-13 ENCOUNTER — Emergency Department (HOSPITAL_COMMUNITY): Payer: Medicare Other

## 2021-07-13 ENCOUNTER — Emergency Department (HOSPITAL_COMMUNITY)
Admission: EM | Admit: 2021-07-13 | Discharge: 2021-07-13 | Disposition: A | Discharge status: Home / Self Care | Payer: Medicare Other | Attending: Emergency Medicine | Admitting: Emergency Medicine

## 2021-07-13 ENCOUNTER — Encounter (HOSPITAL_COMMUNITY): Payer: Self-pay | Admitting: *Deleted

## 2021-07-13 DIAGNOSIS — M79671 Pain in right foot: Secondary | ICD-10-CM | POA: Diagnosis not present

## 2021-07-13 DIAGNOSIS — Z79899 Other long term (current) drug therapy: Secondary | ICD-10-CM | POA: Insufficient documentation

## 2021-07-13 DIAGNOSIS — E1140 Type 2 diabetes mellitus with diabetic neuropathy, unspecified: Secondary | ICD-10-CM | POA: Diagnosis not present

## 2021-07-13 DIAGNOSIS — Z7982 Long term (current) use of aspirin: Secondary | ICD-10-CM | POA: Insufficient documentation

## 2021-07-13 DIAGNOSIS — W19XXXA Unspecified fall, initial encounter: Secondary | ICD-10-CM | POA: Diagnosis not present

## 2021-07-13 DIAGNOSIS — G894 Chronic pain syndrome: Secondary | ICD-10-CM | POA: Diagnosis not present

## 2021-07-13 DIAGNOSIS — M25561 Pain in right knee: Secondary | ICD-10-CM | POA: Insufficient documentation

## 2021-07-13 DIAGNOSIS — S99911A Unspecified injury of right ankle, initial encounter: Secondary | ICD-10-CM | POA: Diagnosis not present

## 2021-07-13 DIAGNOSIS — I10 Essential (primary) hypertension: Secondary | ICD-10-CM | POA: Insufficient documentation

## 2021-07-13 DIAGNOSIS — S82831A Other fracture of upper and lower end of right fibula, initial encounter for closed fracture: Secondary | ICD-10-CM | POA: Diagnosis not present

## 2021-07-13 DIAGNOSIS — Y92009 Unspecified place in unspecified non-institutional (private) residence as the place of occurrence of the external cause: Secondary | ICD-10-CM | POA: Insufficient documentation

## 2021-07-13 DIAGNOSIS — M25571 Pain in right ankle and joints of right foot: Secondary | ICD-10-CM | POA: Diagnosis not present

## 2021-07-13 LAB — CBC WITH DIFFERENTIAL/PLATELET
Abs Immature Granulocytes: 0.04 10*3/uL (ref 0.00–0.07)
Basophils Absolute: 0.1 10*3/uL (ref 0.0–0.1)
Basophils Relative: 1 %
Eosinophils Absolute: 0.2 10*3/uL (ref 0.0–0.5)
Eosinophils Relative: 1 %
HCT: 35.8 % — ABNORMAL LOW (ref 36.0–46.0)
Hemoglobin: 11.6 g/dL — ABNORMAL LOW (ref 12.0–15.0)
Immature Granulocytes: 0 %
Lymphocytes Relative: 15 %
Lymphs Abs: 1.9 10*3/uL (ref 0.7–4.0)
MCH: 29.2 pg (ref 26.0–34.0)
MCHC: 32.4 g/dL (ref 30.0–36.0)
MCV: 90.2 fL (ref 80.0–100.0)
Monocytes Absolute: 1 10*3/uL (ref 0.1–1.0)
Monocytes Relative: 8 %
Neutro Abs: 9.5 10*3/uL — ABNORMAL HIGH (ref 1.7–7.7)
Neutrophils Relative %: 75 %
Platelets: 434 10*3/uL — ABNORMAL HIGH (ref 150–400)
RBC: 3.97 MIL/uL (ref 3.87–5.11)
RDW: 13.1 % (ref 11.5–15.5)
WBC: 12.7 10*3/uL — ABNORMAL HIGH (ref 4.0–10.5)
nRBC: 0 % (ref 0.0–0.2)

## 2021-07-13 LAB — COMPREHENSIVE METABOLIC PANEL
ALT: 19 U/L (ref 0–44)
AST: 19 U/L (ref 15–41)
Albumin: 4.1 g/dL (ref 3.5–5.0)
Alkaline Phosphatase: 64 U/L (ref 38–126)
Anion gap: 10 (ref 5–15)
BUN: 31 mg/dL — ABNORMAL HIGH (ref 6–20)
CO2: 27 mmol/L (ref 22–32)
Calcium: 8.9 mg/dL (ref 8.9–10.3)
Chloride: 102 mmol/L (ref 98–111)
Creatinine, Ser: 1.78 mg/dL — ABNORMAL HIGH (ref 0.44–1.00)
GFR, Estimated: 32 mL/min — ABNORMAL LOW (ref >60)
Glucose, Bld: 59 mg/dL — ABNORMAL LOW (ref 70–99)
Potassium: 4.4 mmol/L (ref 3.5–5.1)
Sodium: 139 mmol/L (ref 135–145)
Total Bilirubin: 0.2 mg/dL — ABNORMAL LOW (ref 0.3–1.2)
Total Protein: 7.4 g/dL (ref 6.5–8.1)

## 2021-07-13 MED ORDER — HYDROCODONE-ACETAMINOPHEN 5-325 MG PO TABS
1.0000 | ORAL_TABLET | Freq: Once | ORAL | Status: AC
Start: 2021-07-13 — End: 2021-07-13
  Administered 2021-07-13: 1 via ORAL
  Filled 2021-07-13: qty 1

## 2021-07-13 MED ORDER — DEXTROSE 50 % IV SOLN
50.0000 mL | Freq: Once | INTRAVENOUS | Status: AC
  Administered 2021-07-13: 50 mL via INTRAVENOUS
  Filled 2021-07-13: qty 50

## 2021-07-13 MED ORDER — OXYCODONE-ACETAMINOPHEN 5-325 MG PO TABS
1.0000 | ORAL_TABLET | Freq: Four times a day (QID) | ORAL | 0 refills | Status: DC | PRN
Start: 2021-07-13 — End: 2021-11-10

## 2021-07-13 MED ORDER — SODIUM CHLORIDE 0.9 % IV BOLUS
1000.0000 mL | Freq: Once | INTRAVENOUS | Status: AC
  Administered 2021-07-13: 1000 mL via INTRAVENOUS

## 2021-07-13 NOTE — ED Triage Notes (Signed)
Fell at home today, pain in right hip and right leg

## 2021-07-13 NOTE — ED Provider Notes (Signed)
[facility] EMERGENCY DEPARTMENT Provider Note   CSN: [id number] Arrival date & time: 07/13/21  1438     History  Chief Complaint  Patient presents with   Fall   Leg Injury    Anita [name] is a 61 y.o. female.  Patient with a fall.  Patient complains of right knee right foot pain.  Patient has a history of hypertension  The history is provided by the patient and medical records. No language interpreter was used.  Fall This is a new problem. The problem occurs constantly. The problem has not changed since onset.Pertinent negatives include no chest pain, no abdominal pain and no headaches. Exacerbated by: Movement. Nothing relieves the symptoms. The treatment provided no relief.      Home Medications Prior to Admission medications   Medication Sig Start Date End Date Taking? Authorizing Provider  oxyCODONE-acetaminophen (PERCOCET) 5-325 MG tablet Take 1 tablet by mouth every 6 (six) hours as needed. 07/13/21  Yes Milton Ferguson, MD  ALPRAZolam Duanne Moron) 1 MG tablet Take 0.5 mg by mouth in the morning, at noon, in the evening, and at bedtime.    [provider]  amLODipine (NORVASC) 5 MG tablet Take 5 mg by mouth at bedtime.  06/15/18   [provider]  aspirin 81 MG chewable tablet Chew 81 mg by mouth daily.    [provider]  atorvastatin (LIPITOR) 20 MG tablet Take 20 mg by mouth daily. 1 Tablet Daily    [provider]  Cholecalciferol (VITAMIN D-3) 125 MCG (5000 UT) TABS Take 5,000 Units by mouth daily.    [provider]  Dextromethorphan-guaiFENesin (MUCUS RELIEF DM MAX) 5-100 MG/5ML LIQD Take 20 mLs by mouth in the morning and at bedtime. As needed .    [provider]  EPINEPHrine 0.3 mg/0.3 mL IJ SOAJ injection Inject 0.3 mg into the muscle as needed for anaphylaxis. Inject as needed for anaphlaxis    [provider]  gabapentin (NEURONTIN) 100 MG capsule Take 100 mg by mouth daily at 6 (six) AM. Two capsules tid.  08/29/19   [provider]  glipiZIDE (GLUCOTROL) 5 MG tablet Take 5 mg by mouth 3 (three) times daily. 06/29/19   [provider]  metFORMIN (GLUCOPHAGE) 500 MG tablet Take 500 mg by mouth in the morning, at noon, in the evening, and at bedtime.     [provider]  methadone (DOLOPHINE) 5 MG tablet Take 5 mg by mouth every 8 (eight) hours as needed (pain.).     [provider]  metoprolol tartrate (LOPRESSOR) 100 MG tablet Take 100 mg by mouth 2 (two) times a day. 06/15/18   [provider]  oxyCODONE (OXY IR/ROXICODONE) 5 MG immediate release tablet Take 5 mg by mouth every 6 (six) hours as needed (pain.).  08/29/19   [provider]  Potassium 99 MG TABS Take 99 mg by mouth daily.    [provider]  tiZANidine (ZANAFLEX) 4 MG tablet Take 8 mg by mouth in the morning, at noon, and at bedtime. 11/21/14   [provider]      Allergies    Ramipril, Imitrex [sumatriptan], Lyrica [pregabalin], and Penicillins    Review of Systems   Review of Systems  Constitutional:  Negative for appetite change and fatigue.  HENT:  Negative for congestion, ear discharge and sinus pressure.   Eyes:  Negative for discharge.  Respiratory:  Negative for cough.   Cardiovascular:  Negative for chest pain.  Gastrointestinal:  Negative for abdominal pain and diarrhea.  Genitourinary:  Negative for frequency and hematuria.  Musculoskeletal:  Negative for back pain.       Tender right knee and right ankle  Skin:  Negative for rash.  Neurological:  Negative for seizures and headaches.  Psychiatric/Behavioral:  Negative for hallucinations.    Physical Exam Updated Vital Signs BP (!) 96/50 (BP Location: Left Arm)    Pulse 72    Temp 98.3 F (36.8 C) (Oral)    Resp 16    SpO2 96%  Physical Exam Vitals and nursing note reviewed.  Constitutional:      Appearance: She is well-developed.  HENT:     Head: Normocephalic.     Nose: Nose normal.   Eyes:     General: No scleral icterus.    Conjunctiva/sclera: Conjunctivae normal.  Neck:     Thyroid: No thyromegaly.  Cardiovascular:     Rate and Rhythm: Normal rate and regular rhythm.     Heart sounds: No murmur heard.   No friction rub. No gallop.  Pulmonary:     Breath sounds: No stridor. No wheezing or rales.  Chest:     Chest wall: No tenderness.  Abdominal:     General: There is no distension.     Tenderness: There is no abdominal tenderness. There is no rebound.  Musculoskeletal:     Cervical back: Neck supple.     Comments: Tenderness to right knee and ankle  Lymphadenopathy:     Cervical: No cervical adenopathy.  Skin:    Findings: No erythema or rash.  Neurological:     Mental Status: She is oriented to person, place, and time.     Motor: No abnormal muscle tone.     Coordination: Coordination normal.  Psychiatric:        Behavior: Behavior normal.    ED Results / Procedures / Treatments   Labs (all labs ordered are listed, but only abnormal results are displayed) Labs Reviewed  CBC WITH DIFFERENTIAL/PLATELET - Abnormal; Notable for the following components:      Result Value   WBC 12.7 (*)    Hemoglobin 11.6 (*)    HCT 35.8 (*)    Platelets 434 (*)    Neutro Abs 9.5 (*)    All other components within normal limits  COMPREHENSIVE METABOLIC PANEL - Abnormal; Notable for the following components:   Glucose, Bld 59 (*)    BUN 31 (*)    Creatinine, Ser 1.78 (*)    Total Bilirubin 0.2 (*)    GFR, Estimated 32 (*)    All other components within normal limits    EKG None  Radiology DG Ankle Complete Right  Result Date: 07/13/2021 CLINICAL DATA:  Fall, ankle pain EXAM: RIGHT ANKLE - COMPLETE 3+ VIEW COMPARISON:  07/13/2021 FINDINGS: Bones are osteopenic. Subtle cortical step-off of the right ankle medial malleolar tip suspicious for a minimally displaced medial malleolar fracture. Lateral and posterior malleoli appear intact. No joint malalignment.  Talus and calcaneus intact. Minor degenerative changes. IMPRESSION: Findings suspicious for a subtle medial malleolar tip minimally displaced fracture. Osteopenia Electronically Signed   By: Jerilynn Mages.  Shick M.D.   On: 07/13/2021 18:25   DG Knee Complete 4 Views Right  Result Date: 07/13/2021 CLINICAL DATA:  Fall today.  Right knee and foot pain. EXAM: RIGHT KNEE - COMPLETE 4+ VIEW COMPARISON:  None. FINDINGS: There is vertically oriented curvilinear lucency within the proximal medial aspect of the fibula nondisplaced fracture. Small amount  of knee joint fluid. Mild chronic enthesopathic change at the quadriceps insertion on the patella. The knee mediolateral compartment joint spaces are maintained. IMPRESSION: Nondisplaced acute fracture of the proximal medial fibula predominantly involving the metaphysis. Electronically Signed   By: Yvonne Kendall M.D.   On: 07/13/2021 16:02   DG Foot Complete Right  Result Date: 07/13/2021 CLINICAL DATA:  RIGHT foot pain post fall, having a hard time bearing weight EXAM: RIGHT FOOT COMPLETE - 3+ VIEW COMPARISON:  None FINDINGS: Osseous demineralization. Joint spaces preserved. No acute fracture, dislocation, or bone destruction. IMPRESSION: No acute osseous abnormalities. Electronically Signed   By: Lavonia Dana M.D.   On: 07/13/2021 16:03    Procedures Procedures    Medications Ordered in ED Medications  dextrose 50 % solution 50 mL (has no administration in time range)  sodium chloride 0.9 % bolus 1,000 mL (0 mLs Intravenous Stopped 07/13/21 1739)  HYDROcodone-acetaminophen (NORCO/VICODIN) 5-325 MG per tablet 1 tablet (1 tablet Oral Given 07/13/21 1750)    ED Course/ Medical Decision Making/ A&P                           Medical Decision Making Amount and/or Complexity of Data Reviewed Labs: ordered. Radiology: ordered.  Risk Prescription drug management.   Patient with a fall.  Patient has a nondisplaced fracture of the proximal medial fibula on the right  and possible fracture of the medial malleolar, patient given a cam walker and told to use a walker to get around with and she is given pain medicine and will follow-up with orthopedics    This patient presents to the ED for concern of fall, this involves an extensive number of treatment options, and is a complaint that carries with it a high risk of complications and morbidity.  The differential diagnosis includes fractured right knee and ankle   Co morbidities that complicate the patient evaluation  Hypertension   Additional history obtained:  Additional history obtained from relative External records from outside source obtained and reviewed including hospital record   Lab Tests:  I Ordered, and personally interpreted labs.  The pertinent results include: CBC and chemistries that shows mild elevated white count with mild elevated creatinine secondary to dehydration   Imaging Studies ordered:  I ordered imaging studies including right knee and right ankle I independently visualized and interpreted imaging which showed fracture of the fibula and possible fracture of medial malleolus I agree with the radiologist interpretation   Cardiac Monitoring:  The patient was maintained on a cardiac monitor.  I personally viewed and interpreted the cardiac monitored which showed an underlying rhythm of: Normal sinus rhythm   Medicines ordered and prescription drug management:  I ordered medication including hydrocodone for pain Reevaluation of the patient after these medicines showed that the patient improved I have reviewed the patients home medicines and have made adjustments as needed   Test Considered:  MRI of the knee    Critical Interventions:  none   Consultations Obtained: No consult  Problem List / ED Course:  Knee and ankle injury with possible fracture   Reevaluation:  After the interventions noted above, I reevaluated the patient and found that they have  :stayed the same   Social Determinants of Health:  None   Dispostion:  After consideration of the diagnostic results and the patients response to treatment, I feel that the patent would benefit from a cam walker pain medicines and follow-up with.  Final Clinical Impression(s) / ED Diagnoses Final diagnoses:  Fall, initial encounter    Rx / DC Orders ED Discharge Orders          Ordered    oxyCODONE-acetaminophen (PERCOCET) 5-325 MG tablet  Every 6 hours PRN        07/13/21 1941              Milton Ferguson, MD 07/17/21 [phone]

## 2021-07-13 NOTE — Discharge Instructions (Addendum)
Follow up with Dr. Amedeo Kinsman later this week.  Use a walker to ambulate with

## 2021-07-17 ENCOUNTER — Encounter: Payer: Self-pay | Admitting: Orthopedic Surgery

## 2021-07-17 ENCOUNTER — Other Ambulatory Visit: Payer: Self-pay

## 2021-07-17 ENCOUNTER — Ambulatory Visit: Payer: Medicare Other | Admitting: Orthopedic Surgery

## 2021-07-17 VITALS — BP 111/62 | HR 69 | Ht 65.0 in | Wt 134.0 lb

## 2021-07-17 DIAGNOSIS — S82831A Other fracture of upper and lower end of right fibula, initial encounter for closed fracture: Secondary | ICD-10-CM | POA: Diagnosis not present

## 2021-07-17 DIAGNOSIS — S93401A Sprain of unspecified ligament of right ankle, initial encounter: Secondary | ICD-10-CM

## 2021-07-17 MED ORDER — OXYCODONE HCL 5 MG PO TABS
5.0000 mg | ORAL_TABLET | Freq: Four times a day (QID) | ORAL | 0 refills | Status: AC | PRN
Start: 2021-07-17 — End: 2021-07-24
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
## 2021-07-24 ENCOUNTER — Telehealth: Payer: Self-pay | Admitting: Orthopedic Surgery

## 2021-07-24 NOTE — Telephone Encounter (Signed)
Patient called to relay that her boot, which was given at office visit 07/17/21, is really bothering and hurting her. States Dr Amedeo Kinsman had discussed that cast was an option, andt that she did not choose it at the time because she never had one before, but she is wondering what to do..Please call patient to advise.

## 2021-07-28 NOTE — Telephone Encounter (Signed)
LVM for return call. 

## 2021-07-31 ENCOUNTER — Other Ambulatory Visit: Payer: Self-pay

## 2021-07-31 ENCOUNTER — Encounter: Payer: Self-pay | Admitting: Orthopedic Surgery

## 2021-07-31 ENCOUNTER — Ambulatory Visit: Payer: Medicare Other

## 2021-07-31 ENCOUNTER — Ambulatory Visit: Payer: Medicare Other | Admitting: Orthopedic Surgery

## 2021-07-31 DIAGNOSIS — S93401D Sprain of unspecified ligament of right ankle, subsequent encounter: Secondary | ICD-10-CM

## 2021-07-31 DIAGNOSIS — S82831D Other fracture of upper and lower end of right fibula, subsequent encounter for closed fracture with routine healing: Secondary | ICD-10-CM

## 2021-07-31 NOTE — Patient Instructions (Signed)
Instructions ° °1.  You have sustained an ankle sprain, or similar exercises that can be treated as an ankle sprain.  **These exercises can also be used as part of recovery from an ankle fracture.  °2.  I encourage you to stay on your feet and gradually remove your walking boot.   °3.  Below are some exercises that you can complete on your own to improve your symptoms.  °4.  As an alternative, you can search for ankle sprain exercises online, and can see some demonstrations on YouTube  °5.  If you are having difficulty with these exercises, we can also prescribe formal physical therapy ° °Ankle Exercises °Ask your health care provider which exercises are safe for you. Do exercises exactly as told by your health care provider and adjust them as directed. It is normal to feel mild stretching, pulling, tightness, or mild discomfort as you do these exercises. Stop right away if you feel sudden pain or your pain gets worse. Do not begin these exercises until told by your health care provider. ° °Stretching and range-of-motion exercises °These exercises warm up your muscles and joints and improve the movement and flexibility of your ankle. These exercises may also help to relieve pain. ° °Dorsiflexion/plantar flexion ° °Sit with your R knee straight or bent. Do not rest your foot on anything. °Flex your left ankle to tilt the top of your foot toward your shin. This is called dorsiflexion. °Hold this position for 5 seconds. °Point your toes downward to tilt the top of your foot away from your shin. This is called plantar flexion. °Hold this position for 5 seconds. °Repeat 10 times. Complete this exercise 2-3 times a day.  As tolerated ° °Ankle alphabet ° °Sit with your R foot supported at your lower leg. °Do not rest your foot on anything. °Make sure your foot has room to move freely. °Think of your R foot as a paintbrush: °Move your foot to trace each letter of the alphabet in the air. Keep your hip and knee still while  you trace the letters. Trace every letter from A to Z. °Make the letters as large as you can without causing or increasing any discomfort. ° °Repeat 2-3 times. Complete this exercise 2-3 times a day. ° ° °Strengthening exercises °These exercises build strength and endurance in your ankle. Endurance is the ability to use your muscles for a long time, even after they get tired. °Dorsiflexors °These are muscles that lift your foot up. °Secure a rubber exercise band or tube to an object, such as a table leg, that will stay still when the band is pulled. Secure the other end around your R foot. °Sit on the floor, facing the object with your R leg extended. The band or tube should be slightly tense when your foot is relaxed. °Slowly flex your R ankle and toes to bring your foot toward your shin. °Hold this position for 5 seconds. °Slowly return your foot to the starting position, controlling the band as you do that. °Repeat 10 times. Complete this exercise 2-3 times a day. ° °Plantar flexors °These are muscles that push your foot down. °Sit on the floor with your R leg extended. °Loop a rubber exercise band or tube around the ball of your R foot. The ball of your foot is on the walking surface, right under your toes. The band or tube should be slightly tense when your foot is relaxed. °Slowly point your toes downward, pushing them away from   you. Hold this position for 5 seconds. Slowly release the tension in the band or tube, controlling smoothly until your foot is back in the starting position. Repeat 10 times. Complete this exercise 2-3 times a day.  Towel curls  Sit in a chair on a non-carpeted surface, and put your feet on the floor. Place a towel in front of your feet. Keeping your heel on the floor, put your R foot on the towel. Pull the towel toward you by grabbing the towel with your toes and curling them under. Keep your heel on the floor. Let your toes relax. Grab the towel again. Keep pulling the  towel until it is completely underneath your foot. Repeat 10 times. Complete this exercise 2-3 times a day.  Standing plantar flexion This is an exercise in which you use your toes to lift your body's weight while standing. Stand with your feet shoulder-width apart. Keep your weight spread evenly over the width of your feet while you rise up on your toes. Use a wall or table to steady yourself if needed, but try not to use it for support. If this exercise is too easy, try these options: Shift your weight toward your R leg until you feel challenged. If told by your health care provider, lift your uninjured leg off the floor. Hold this position for 5 seconds. Repeat 10 times. Complete this exercise 2-3 times a day.  Tandem walking Stand with one foot directly in front of the other. Slowly raise your back foot up, lifting your heel before your toes, and place it directly in front of your other foot. Continue to walk in this heel-to-toe way. Have a countertop or wall nearby to use if needed to keep your balance, but try not to hold onto anything for support.  Repeat 10 times. Complete this exercise 2-3 times a day.   Document Revised: 02/11/2018 Document Reviewed: 02/13/2018 [employer] Patient Education  [place].    Knee Exercises  Ask your health care provider which exercises are safe for you. Do exercises exactly as told by your health care provider and adjust them as directed. It is normal to feel mild stretching, pulling, tightness, or discomfort as you do these exercises. Stop right away if you feel sudden pain or your pain gets worse. Do not begin these exercises until told by your health care provider.  Stretching and range-of-motion exercises These exercises warm up your muscles and joints and improve the movement and flexibility of your knee. These exercises also help to relieve pain and swelling.  Knee extension, prone Lie on your abdomen (prone position) on a  bed. Place your left / right knee just beyond the edge of the surface so your knee is not on the bed. You can put a towel under your left / right thigh just above your kneecap for comfort. Relax your leg muscles and allow gravity to straighten your knee (extension). You should feel a stretch behind your left / right knee. Hold this position for 10 seconds. Scoot up so your knee is supported between repetitions. Repeat 10 times. Complete this exercise 3-4 times per week.     Knee flexion, active Lie on your back with both legs straight. If this causes back discomfort, bend your left / right knee so your foot is flat on the floor. Slowly slide your left / right heel back toward your buttocks. Stop when you feel a gentle stretch in the front of your knee or thigh (flexion). Hold this  position for 10 seconds. Slowly slide your left / right heel back to the starting position. Repeat 10 times. Complete this exercise 3-4 times per week.      Quadriceps stretch, prone Lie on your abdomen on a firm surface, such as a bed or padded floor. Bend your left / right knee and hold your ankle. If you cannot reach your ankle or pant leg, loop a belt around your foot and grab the belt instead. Gently pull your heel toward your buttocks. Your knee should not slide out to the side. You should feel a stretch in the front of your thigh and knee (quadriceps). Hold this position for 10 seconds. Repeat 10 times. Complete this exercise 3-4 times per week.      Hamstring, supine Lie on your back (supine position). Loop a belt or towel over the ball of your left / right foot. The ball of your foot is on the walking surface, right under your toes. Straighten your left / right knee and slowly pull on the belt to raise your leg until you feel a gentle stretch behind your knee (hamstring). Do not let your knee bend while you do this. Keep your other leg flat on the floor. Hold this position for 10 seconds. Repeat 10  times. Complete this exercise 3-4 times per week.   Strengthening exercises These exercises build strength and endurance in your knee. Endurance is the ability to use your muscles for a long time, even after they get tired.  Quadriceps, isometric This exercise stretches the muscles in front of your thigh (quadriceps) without moving your knee joint (isometric). Lie on your back with your left / right leg extended and your other knee bent. Put a rolled towel or small pillow under your knee if told by your health care provider. Slowly tense the muscles in the front of your left / right thigh. You should see your kneecap slide up toward your hip or see increased dimpling just above the knee. This motion will push the back of the knee toward the floor. For 10 seconds, hold the muscle as tight as you can without increasing your pain. Relax the muscles slowly and completely. Repeat 10 times. Complete this exercise 3-4 times per week. .     Straight leg raises This exercise stretches the muscles in front of your thigh (quadriceps) and the muscles that move your hips (hip flexors). Lie on your back with your left / right leg extended and your other knee bent. Tense the muscles in the front of your left / right thigh. You should see your kneecap slide up or see increased dimpling just above the knee. Your thigh may even shake a bit. Keep these muscles tight as you raise your leg 4-6 inches (10-15 cm) off the floor. Do not let your knee bend. Hold this position for 10 seconds. Keep these muscles tense as you lower your leg. Relax your muscles slowly and completely after each repetition. Repeat 10 times. Complete this exercise 3-4 times per week.  Hamstring, isometric Lie on your back on a firm surface. Bend your left / right knee about 30 degrees. Dig your left / right heel into the surface as if you are trying to pull it toward your buttocks. Tighten the muscles in the back of your thighs  (hamstring) to "dig" as hard as you can without increasing any pain. Hold this position for 10 seconds. Release the tension gradually and allow your muscles to relax completely for __________ seconds  after each repetition. Repeat 10 times. Complete this exercise 3-4 times per week.  Hamstring curls If told by your health care provider, do this exercise while wearing ankle weights. Begin with 5 lb weights. Then increase the weight by 1 lb (0.5 kg) increments. You can also use an exercise band Lie on your abdomen with your legs straight. Bend your left / right knee as far as you can without feeling pain. Keep your hips flat against the floor. Hold this position for 10 seconds. Slowly lower your leg to the starting position. Repeat 10 times. Complete this exercise 3-4 times per week.      Squats This exercise strengthens the muscles in front of your thigh and knee (quadriceps). Stand in front of a table, with your feet and knees pointing straight ahead. You may rest your hands on the table for balance but not for support. Slowly bend your knees and lower your hips like you are going to sit in a chair. Keep your weight over your heels, not over your toes. Keep your lower legs upright so they are parallel with the table legs. Do not let your hips go lower than your knees. Do not bend lower than told by your health care provider. If your knee pain increases, do not bend as low. Hold the squat position for 10 seconds. Slowly push with your legs to return to standing. Do not use your hands to pull yourself to standing. Repeat 10 times. Complete this exercise 3-4 times per week .     Wall slides This exercise strengthens the muscles in front of your thigh and knee (quadriceps). Lean your back against a smooth wall or door, and walk your feet out 18-24 inches (46-61 cm) from it. Place your feet hip-width apart. Slowly slide down the wall or door until your knees bend 90 degrees. Keep your knees  over your heels, not over your toes. Keep your knees in line with your hips. Hold this position for 10 seconds. Repeat 10 times. Complete this exercise 3-4 times per week.      Straight leg raises This exercise strengthens the muscles that rotate the leg at the hip and move it away from your body (hip abductors). Lie on your side with your left / right leg in the top position. Lie so your head, shoulder, knee, and hip line up. You may bend your bottom knee to help you keep your balance. Roll your hips slightly forward so your hips are stacked directly over each other and your left / right knee is facing forward. Leading with your heel, lift your top leg 4-6 inches (10-15 cm). You should feel the muscles in your outer hip lifting. Do not let your foot drift forward. Do not let your knee roll toward the ceiling. Hold this position for 10 seconds. Slowly return your leg to the starting position. Let your muscles relax completely after each repetition. Repeat 10 times. Complete this exercise 3-4 times per week.      Straight leg raises This exercise stretches the muscles that move your hips away from the front of the pelvis (hip extensors). Lie on your abdomen on a firm surface. You can put a pillow under your hips if that is more comfortable. Tense the muscles in your buttocks and lift your left / right leg about 4-6 inches (10-15 cm). Keep your knee straight as you lift your leg. Hold this position for 10 seconds. Slowly lower your leg to the starting position. Let your  leg relax completely after each repetition. Repeat 10 times. Complete this exercise 3-4 times per week.

## 2021-07-31 NOTE — Progress Notes (Signed)
Return patient Visit ? ?Assessment: ?Anita [name] is a 61 y.o. female with the following: ?1. Closed fracture of proximal end of right fibula, unspecified fracture morphology, subsequent encounter ?2. Sprain of right ankle, unspecified ligament, subsequent encounter ? ?Plan: ?Radiographs are stable.  She has no swelling, bruising or redness on physical exam.  She is diffusely tender.  I have provided her with reassurance.  She can start to wean out of the use of the boot.  She can start to bear weight on her right lower extremity.  She seemed to be pleased by this.  Continue with her current medications.  Follow-up in approximately 1 month. ? ? ?Follow-up: ?Return in about 4 weeks (around 08/28/2021). ? ?Subjective: ? ?Chief Complaint  ?Patient presents with  ? fracture care  ?  RT knee/right ankle ?DOI 07/13/21  ? ? ?History of Present Illness: ?Anita [name] is a 61 y.o. female who returns for evaluation of right lower leg pain.  She sustained an injury to the proximal fibula, as well as a right ankle sprain.  She continues to have pain.  No recent injuries.  She has been wearing a walking boot, which she would like to get rid of. ? ?Review of Systems: ?No fevers or chills ?No numbness or tingling ?No chest pain ?No shortness of breath ?No bowel or bladder dysfunction ?No GI distress ?No headaches ? ?Objective: ?There were no vitals taken for this visit. ? ?Physical Exam: ? ?General: Alert and oriented., No acute distress., and Seated in a wheelchair. ?Gait: Unable to ambulate. ? ?Evaluation of the right leg demonstrates no swelling about the knee, or the ankle.  No bruising is appreciated.  She has tenderness to palpation of the proximal fibula.  Diffuse tenderness to palpation around the right ankle.  She also has tenderness to palpation along the medial and lateral joint lines.  Comfortably gets to 90 degrees.  Toes are warm and well-perfused.  Sensation is intact distally. ? ?IMAGING: ?I personally reviewed  images previously obtained from the ED ? ?X-rays of the right knee were obtained in clinic today.  Neutral overall alignment.  Well-maintained joint space.  Minimally displaced fracture of the proximal fibula.  No interval displacement.  No callus formation. ? ?Impression: Healing minimally displaced right proximal fibula fracture. ? ?X-rays of the right ankle were obtained in clinic today.  No acute injuries are noted.  The mortise is congruent.  No soft tissue swelling is appreciated.  No syndesmotic disruption. ? ?Impression: Normal right ankle x-rays. ? ? ?New Medications:  ?No orders of the defined types were placed in this encounter. ? ? ? ? ?Mordecai Rasmussen, MD ? ?07/31/2021 ?9:00 PM ? ? ?

## 2021-08-11 DIAGNOSIS — Z6821 Body mass index (BMI) 21.0-21.9, adult: Secondary | ICD-10-CM | POA: Diagnosis not present

## 2021-08-11 DIAGNOSIS — E782 Mixed hyperlipidemia: Secondary | ICD-10-CM | POA: Diagnosis not present

## 2021-08-11 DIAGNOSIS — E1140 Type 2 diabetes mellitus with diabetic neuropathy, unspecified: Secondary | ICD-10-CM | POA: Diagnosis not present

## 2021-08-11 DIAGNOSIS — R519 Headache, unspecified: Secondary | ICD-10-CM | POA: Diagnosis not present

## 2021-08-11 DIAGNOSIS — S0990XA Unspecified injury of head, initial encounter: Secondary | ICD-10-CM | POA: Diagnosis not present

## 2021-08-11 DIAGNOSIS — B351 Tinea unguium: Secondary | ICD-10-CM | POA: Diagnosis not present

## 2021-08-11 DIAGNOSIS — R55 Syncope and collapse: Secondary | ICD-10-CM | POA: Diagnosis not present

## 2021-08-13 ENCOUNTER — Telehealth: Payer: Self-pay

## 2021-08-13 NOTE — Telephone Encounter (Signed)
NOTES SCANNED TO REFERRAL 

## 2021-08-27 ENCOUNTER — Encounter (INDEPENDENT_AMBULATORY_CARE_PROVIDER_SITE_OTHER): Payer: Self-pay

## 2021-08-28 ENCOUNTER — Ambulatory Visit: Payer: Medicare Other

## 2021-08-28 ENCOUNTER — Encounter: Payer: Self-pay | Admitting: Orthopedic Surgery

## 2021-08-28 ENCOUNTER — Ambulatory Visit: Payer: Medicare Other | Admitting: Orthopedic Surgery

## 2021-08-28 VITALS — Ht 65.0 in | Wt 134.0 lb

## 2021-08-28 DIAGNOSIS — S82831D Other fracture of upper and lower end of right fibula, subsequent encounter for closed fracture with routine healing: Secondary | ICD-10-CM

## 2021-08-28 NOTE — Patient Instructions (Signed)
Instructions ? ?1.  You have sustained an ankle sprain, or similar exercises that can be treated as an ankle sprain.  **These exercises can also be used as part of recovery from an ankle fracture.  ?2.  I encourage you to stay on your feet and gradually remove your walking boot.   ?3.  Below are some exercises that you can complete on your own to improve your symptoms.  ?4.  As an alternative, you can search for ankle sprain exercises online, and can see some demonstrations on YouTube  ?5.  If you are having difficulty with these exercises, we can also prescribe formal physical therapy ? ?Ankle Exercises ?Ask your health care provider which exercises are safe for you. Do exercises exactly as told by your health care provider and adjust them as directed. It is normal to feel mild stretching, pulling, tightness, or mild discomfort as you do these exercises. Stop right away if you feel sudden pain or your pain gets worse. Do not begin these exercises until told by your health care provider. ? ?Stretching and range-of-motion exercises ?These exercises warm up your muscles and joints and improve the movement and flexibility of your ankle. These exercises may also help to relieve pain. ? ?Dorsiflexion/plantar flexion ? ?Sit with your R knee straight or bent. Do not rest your foot on anything. ?Flex your left ankle to tilt the top of your foot toward your shin. This is called dorsiflexion. ?Hold this position for 5 seconds. ?Point your toes downward to tilt the top of your foot away from your shin. This is called plantar flexion. ?Hold this position for 5 seconds. ?Repeat 10 times. Complete this exercise 2-3 times a day.  As tolerated ? ?Ankle alphabet ? ?Sit with your R foot supported at your lower leg. ?Do not rest your foot on anything. ?Make sure your foot has room to move freely. ?Think of your R foot as a paintbrush: ?Move your foot to trace each letter of the alphabet in the air. Keep your hip and knee still while  you trace the letters. Trace every letter from A to Z. ?Make the letters as large as you can without causing or increasing any discomfort. ? ?Repeat 2-3 times. Complete this exercise 2-3 times a day. ? ? ?Strengthening exercises ?These exercises build strength and endurance in your ankle. Endurance is the ability to use your muscles for a long time, even after they get tired. ?Dorsiflexors ?These are muscles that lift your foot up. ?Secure a rubber exercise band or tube to an object, such as a table leg, that will stay still when the band is pulled. Secure the other end around your R foot. ?Sit on the floor, facing the object with your R leg extended. The band or tube should be slightly tense when your foot is relaxed. ?Slowly flex your R ankle and toes to bring your foot toward your shin. ?Hold this position for 5 seconds. ?Slowly return your foot to the starting position, controlling the band as you do that. ?Repeat 10 times. Complete this exercise 2-3 times a day. ? ?Plantar flexors ?These are muscles that push your foot down. ?Sit on the floor with your R leg extended. ?Loop a rubber exercise band or tube around the ball of your R foot. The ball of your foot is on the walking surface, right under your toes. The band or tube should be slightly tense when your foot is relaxed. ?Slowly point your toes downward, pushing them away from  you. ?Hold this position for 5 seconds. ?Slowly release the tension in the band or tube, controlling smoothly until your foot is back in the starting position. ?Repeat 10 times. Complete this exercise 2-3 times a day. ? ?Towel curls ? ?Sit in a chair on a non-carpeted surface, and put your feet on the floor. ?Place a towel in front of your feet. ?Keeping your heel on the floor, put your R foot on the towel. ?Pull the towel toward you by grabbing the towel with your toes and curling them under. Keep your heel on the floor. ?Let your toes relax. ?Grab the towel again. Keep pulling the  towel until it is completely underneath your foot. ?Repeat 10 times. Complete this exercise 2-3 times a day. ? ?Standing plantar flexion ?This is an exercise in which you use your toes to lift your body's weight while standing. ?Stand with your feet shoulder-width apart. ?Keep your weight spread evenly over the width of your feet while you rise up on your toes. Use a wall or table to steady yourself if needed, but try not to use it for support. ?If this exercise is too easy, try these options: ?Shift your weight toward your R leg until you feel challenged. ?If told by your health care provider, lift your uninjured leg off the floor. ?Hold this position for 5 seconds. ?Repeat 10 times. Complete this exercise 2-3 times a day. ? ?Tandem walking ?Stand with one foot directly in front of the other. ?Slowly raise your back foot up, lifting your heel before your toes, and place it directly in front of your other foot. ?Continue to walk in this heel-to-toe way. Have a countertop or wall nearby to use if needed to keep your balance, but try not to hold onto anything for support. ? ?Repeat 10 times. Complete this exercise 2-3 times a day. ? ? ?Document Revised: 02/11/2018 Document Reviewed: 02/13/2018 ?Elsevier Patient Education ? [employer]. ? ? ? ? ?Knee Exercises ? ?Ask your health care provider which exercises are safe for you. Do exercises exactly as told by your health care provider and adjust them as directed. It is normal to feel mild stretching, pulling, tightness, or discomfort as you do these exercises. Stop right away if you feel sudden pain or your pain gets worse. Do not begin these exercises until told by your health care provider. ? ?Stretching and range-of-motion exercises ?These exercises warm up your muscles and joints and improve the movement and flexibility of your knee. These exercises also help to relieve pain and swelling. ? ?Knee extension, prone ?Lie on your abdomen (prone position) on a  bed. ?Place your left / right knee just beyond the edge of the surface so your knee is not on the bed. You can put a towel under your left / right thigh just above your kneecap for comfort. ?Relax your leg muscles and allow gravity to straighten your knee (extension). You should feel a stretch behind your left / right knee. ?Hold this position for 10 seconds. ?Scoot up so your knee is supported between repetitions. ?Repeat 10 times. Complete this exercise 3-4 times per week. ?    ?Knee flexion, active ?Lie on your back with both legs straight. If this causes back discomfort, bend your left / right knee so your foot is flat on the floor. ?Slowly slide your left / right heel back toward your buttocks. Stop when you feel a gentle stretch in the front of your knee or thigh (flexion). ?Hold  this position for 10 seconds. ?Slowly slide your left / right heel back to the starting position. ?Repeat 10 times. Complete this exercise 3-4 times per week. ?  ?   ?Quadriceps stretch, prone ?Lie on your abdomen on a firm surface, such as a bed or padded floor. ?Bend your left / right knee and hold your ankle. If you cannot reach your ankle or pant leg, loop a belt around your foot and grab the belt instead. ?Gently pull your heel toward your buttocks. Your knee should not slide out to the side. You should feel a stretch in the front of your thigh and knee (quadriceps). ?Hold this position for 10 seconds. ?Repeat 10 times. Complete this exercise 3-4 times per week. ?  ?   ?Hamstring, supine ?Lie on your back (supine position). ?Loop a belt or towel over the ball of your left / right foot. The ball of your foot is on the walking surface, right under your toes. ?Straighten your left / right knee and slowly pull on the belt to raise your leg until you feel a gentle stretch behind your knee (hamstring). ?Do not let your knee bend while you do this. ?Keep your other leg flat on the floor. ?Hold this position for 10 seconds. ?Repeat 10  times. Complete this exercise 3-4 times per week. ? ? ?Strengthening exercises ?These exercises build strength and endurance in your knee. Endurance is the ability to use your muscles for a long time, even afte

## 2021-08-28 NOTE — Progress Notes (Signed)
Return patient Visit ? ?Assessment: ?Anita [name] is a 61 y.o. female with the following: ?1. Closed fracture of proximal end of right fibula, unspecified fracture morphology, subsequent encounter ?2. Sprain of right ankle, unspecified ligament, subsequent encounter ? ?Plan: ?Radiographs are stable.  She has healed the proximal fibula fracture.  On physical exam, she has no tenderness about the knee, or the ankle.  Okay to increase her level of activity.  I provided her with ankle sprain and general knee exercises.  She should wean out of the boot.  Continue to do so carefully.  Otherwise, no restrictions on her activities.  Follow-up as needed. ? ? ?Follow-up: ?Return if symptoms worsen or fail to improve. ? ?Subjective: ? ?Chief Complaint  ?Patient presents with  ? Fracture  ?  Rt fx care DOI 07/13/21  ? ? ?History of Present Illness: ?Anita [name] is a 61 y.o. female who returns for evaluation of right lower leg pain.  She sustained an injury to the proximal fibula, as well as a right ankle sprain, approximately 6 weeks ago.  She is getting better.  She has minimal pain in the right knee, and the ankle.  She states that she is able to walk without the walking boot at home, but does put the boot on when she is on uneven terrain.  She has occasional pains in the knee. ? ? ?Review of Systems: ?No fevers or chills ?No numbness or tingling ?No chest pain ?No shortness of breath ?No bowel or bladder dysfunction ?No GI distress ?No headaches ? ?Objective: ?Ht '5\' 5"'$  (1.651 m)   Wt 134 lb (60.8 kg)   BMI 22.30 kg/m?  ? ?Physical Exam: ? ?General: Alert and oriented., No acute distress., and Seated in a wheelchair. ?Gait: Ambulates with the assistance of a cane, in a walking boot. ? ?Right knee and ankle without bruising or swelling.  No tenderness to palpation over the proximal fibula.  No tenderness to palpation about the ankle.  She has 5/5 strength with dorsiflexion, plantarflexion, inversion and eversion.  Toes are  warm and well-perfused. ? ? ?IMAGING: ?I personally reviewed images previously obtained from the ED ? ?X-rays of the right knee were obtained in clinic today.  No acute injuries are noted.  Minimally displaced fracture of the proximal fibula is not easily visualized.  Minimal degenerative changes within the right knee.  Small osteophytes are noted.  Well-maintained joint space. ? ?Impression: Healed minimally displaced fracture of the proximal right fibula. ? ?New Medications:  ?No orders of the defined types were placed in this encounter. ? ? ? ? ?Mordecai Rasmussen, MD ? ?08/28/2021 ?10:45 AM ? ? ?

## 2021-09-03 DIAGNOSIS — E1140 Type 2 diabetes mellitus with diabetic neuropathy, unspecified: Secondary | ICD-10-CM | POA: Diagnosis not present

## 2021-09-03 DIAGNOSIS — G894 Chronic pain syndrome: Secondary | ICD-10-CM | POA: Diagnosis not present

## 2021-09-03 DIAGNOSIS — Z6822 Body mass index (BMI) 22.0-22.9, adult: Secondary | ICD-10-CM | POA: Diagnosis not present

## 2021-09-03 DIAGNOSIS — I10 Essential (primary) hypertension: Secondary | ICD-10-CM | POA: Diagnosis not present

## 2021-09-03 DIAGNOSIS — N200 Calculus of kidney: Secondary | ICD-10-CM | POA: Diagnosis not present

## 2021-10-06 ENCOUNTER — Telehealth: Payer: Self-pay

## 2021-10-06 DIAGNOSIS — R519 Headache, unspecified: Secondary | ICD-10-CM | POA: Diagnosis not present

## 2021-10-06 DIAGNOSIS — E1140 Type 2 diabetes mellitus with diabetic neuropathy, unspecified: Secondary | ICD-10-CM | POA: Diagnosis not present

## 2021-10-06 DIAGNOSIS — S0990XA Unspecified injury of head, initial encounter: Secondary | ICD-10-CM | POA: Diagnosis not present

## 2021-10-06 DIAGNOSIS — G894 Chronic pain syndrome: Secondary | ICD-10-CM | POA: Diagnosis not present

## 2021-10-06 DIAGNOSIS — I2510 Atherosclerotic heart disease of native coronary artery without angina pectoris: Secondary | ICD-10-CM | POA: Diagnosis not present

## 2021-10-06 DIAGNOSIS — I10 Essential (primary) hypertension: Secondary | ICD-10-CM | POA: Diagnosis not present

## 2021-10-06 DIAGNOSIS — Z6821 Body mass index (BMI) 21.0-21.9, adult: Secondary | ICD-10-CM | POA: Diagnosis not present

## 2021-10-06 NOTE — Telephone Encounter (Signed)
NOTES SCANNED TO REFERRAL 

## 2021-10-07 ENCOUNTER — Other Ambulatory Visit (HOSPITAL_COMMUNITY): Payer: Self-pay | Admitting: Internal Medicine

## 2021-10-07 ENCOUNTER — Other Ambulatory Visit: Payer: Self-pay | Admitting: Internal Medicine

## 2021-10-07 DIAGNOSIS — S0990XA Unspecified injury of head, initial encounter: Secondary | ICD-10-CM

## 2021-10-07 DIAGNOSIS — R519 Headache, unspecified: Secondary | ICD-10-CM

## 2021-11-02 ENCOUNTER — Ambulatory Visit (HOSPITAL_COMMUNITY)
Admission: RE | Admit: 2021-11-02 | Discharge: 2021-11-02 | Disposition: A | Discharge status: Home / Self Care | Payer: Medicare Other | Source: Ambulatory Visit | Attending: Internal Medicine | Admitting: Internal Medicine

## 2021-11-02 DIAGNOSIS — R519 Headache, unspecified: Secondary | ICD-10-CM | POA: Diagnosis not present

## 2021-11-02 DIAGNOSIS — S0990XA Unspecified injury of head, initial encounter: Secondary | ICD-10-CM | POA: Diagnosis not present

## 2021-11-05 DIAGNOSIS — E038 Other specified hypothyroidism: Secondary | ICD-10-CM | POA: Diagnosis not present

## 2021-11-05 DIAGNOSIS — E538 Deficiency of other specified B group vitamins: Secondary | ICD-10-CM | POA: Diagnosis not present

## 2021-11-05 DIAGNOSIS — E559 Vitamin D deficiency, unspecified: Secondary | ICD-10-CM | POA: Diagnosis not present

## 2021-11-05 DIAGNOSIS — I10 Essential (primary) hypertension: Secondary | ICD-10-CM | POA: Diagnosis not present

## 2021-11-05 DIAGNOSIS — I700 Atherosclerosis of aorta: Secondary | ICD-10-CM | POA: Diagnosis not present

## 2021-11-05 DIAGNOSIS — G894 Chronic pain syndrome: Secondary | ICD-10-CM | POA: Diagnosis not present

## 2021-11-05 DIAGNOSIS — Z0000 Encounter for general adult medical examination without abnormal findings: Secondary | ICD-10-CM | POA: Diagnosis not present

## 2021-11-05 DIAGNOSIS — Z6821 Body mass index (BMI) 21.0-21.9, adult: Secondary | ICD-10-CM | POA: Diagnosis not present

## 2021-11-05 DIAGNOSIS — E1165 Type 2 diabetes mellitus with hyperglycemia: Secondary | ICD-10-CM | POA: Diagnosis not present

## 2021-11-05 DIAGNOSIS — E1140 Type 2 diabetes mellitus with diabetic neuropathy, unspecified: Secondary | ICD-10-CM | POA: Diagnosis not present

## 2021-11-05 DIAGNOSIS — Z0001 Encounter for general adult medical examination with abnormal findings: Secondary | ICD-10-CM | POA: Diagnosis not present

## 2021-11-10 ENCOUNTER — Other Ambulatory Visit: Payer: Self-pay

## 2021-11-10 ENCOUNTER — Inpatient Hospital Stay (HOSPITAL_COMMUNITY)
Admission: EM | Admit: 2021-11-10 | Discharge: 2021-11-13 | DRG: 871 | Disposition: A | Discharge status: Home / Self Care | Payer: Medicare Other | Attending: Family Medicine | Admitting: Family Medicine

## 2021-11-10 ENCOUNTER — Emergency Department (HOSPITAL_COMMUNITY): Payer: Medicare Other

## 2021-11-10 ENCOUNTER — Encounter (HOSPITAL_COMMUNITY): Payer: Self-pay | Admitting: *Deleted

## 2021-11-10 DIAGNOSIS — R4182 Altered mental status, unspecified: Secondary | ICD-10-CM | POA: Diagnosis present

## 2021-11-10 DIAGNOSIS — N179 Acute kidney failure, unspecified: Secondary | ICD-10-CM

## 2021-11-10 DIAGNOSIS — Z7982 Long term (current) use of aspirin: Secondary | ICD-10-CM

## 2021-11-10 DIAGNOSIS — Z981 Arthrodesis status: Secondary | ICD-10-CM | POA: Diagnosis not present

## 2021-11-10 DIAGNOSIS — N390 Urinary tract infection, site not specified: Secondary | ICD-10-CM | POA: Diagnosis not present

## 2021-11-10 DIAGNOSIS — G9340 Encephalopathy, unspecified: Secondary | ICD-10-CM | POA: Diagnosis not present

## 2021-11-10 DIAGNOSIS — S0990XA Unspecified injury of head, initial encounter: Secondary | ICD-10-CM | POA: Diagnosis not present

## 2021-11-10 DIAGNOSIS — U071 COVID-19: Secondary | ICD-10-CM | POA: Diagnosis present

## 2021-11-10 DIAGNOSIS — R6520 Severe sepsis without septic shock: Secondary | ICD-10-CM

## 2021-11-10 DIAGNOSIS — N1832 Chronic kidney disease, stage 3b: Secondary | ICD-10-CM | POA: Diagnosis not present

## 2021-11-10 DIAGNOSIS — I129 Hypertensive chronic kidney disease with stage 1 through stage 4 chronic kidney disease, or unspecified chronic kidney disease: Secondary | ICD-10-CM | POA: Diagnosis not present

## 2021-11-10 DIAGNOSIS — E11649 Type 2 diabetes mellitus with hypoglycemia without coma: Secondary | ICD-10-CM | POA: Diagnosis present

## 2021-11-10 DIAGNOSIS — E1165 Type 2 diabetes mellitus with hyperglycemia: Secondary | ICD-10-CM | POA: Diagnosis present

## 2021-11-10 DIAGNOSIS — A4151 Sepsis due to Escherichia coli [E. coli]: Principal | ICD-10-CM | POA: Diagnosis present

## 2021-11-10 DIAGNOSIS — Z880 Allergy status to penicillin: Secondary | ICD-10-CM

## 2021-11-10 DIAGNOSIS — I2510 Atherosclerotic heart disease of native coronary artery without angina pectoris: Secondary | ICD-10-CM | POA: Diagnosis not present

## 2021-11-10 DIAGNOSIS — E785 Hyperlipidemia, unspecified: Secondary | ICD-10-CM | POA: Diagnosis not present

## 2021-11-10 DIAGNOSIS — E871 Hypo-osmolality and hyponatremia: Secondary | ICD-10-CM | POA: Diagnosis present

## 2021-11-10 DIAGNOSIS — A419 Sepsis, unspecified organism: Secondary | ICD-10-CM | POA: Diagnosis present

## 2021-11-10 DIAGNOSIS — Z743 Need for continuous supervision: Secondary | ICD-10-CM | POA: Diagnosis not present

## 2021-11-10 DIAGNOSIS — E440 Moderate protein-calorie malnutrition: Secondary | ICD-10-CM | POA: Diagnosis not present

## 2021-11-10 DIAGNOSIS — M4322 Fusion of spine, cervical region: Secondary | ICD-10-CM | POA: Diagnosis not present

## 2021-11-10 DIAGNOSIS — Z79890 Hormone replacement therapy (postmenopausal): Secondary | ICD-10-CM

## 2021-11-10 DIAGNOSIS — R109 Unspecified abdominal pain: Secondary | ICD-10-CM

## 2021-11-10 DIAGNOSIS — Z79899 Other long term (current) drug therapy: Secondary | ICD-10-CM

## 2021-11-10 DIAGNOSIS — E1140 Type 2 diabetes mellitus with diabetic neuropathy, unspecified: Secondary | ICD-10-CM | POA: Diagnosis present

## 2021-11-10 DIAGNOSIS — Z7984 Long term (current) use of oral hypoglycemic drugs: Secondary | ICD-10-CM

## 2021-11-10 DIAGNOSIS — W19XXXA Unspecified fall, initial encounter: Secondary | ICD-10-CM | POA: Diagnosis not present

## 2021-11-10 DIAGNOSIS — Z8673 Personal history of transient ischemic attack (TIA), and cerebral infarction without residual deficits: Secondary | ICD-10-CM | POA: Diagnosis not present

## 2021-11-10 DIAGNOSIS — E1142 Type 2 diabetes mellitus with diabetic polyneuropathy: Secondary | ICD-10-CM | POA: Diagnosis present

## 2021-11-10 DIAGNOSIS — I161 Hypertensive emergency: Secondary | ICD-10-CM | POA: Diagnosis present

## 2021-11-10 DIAGNOSIS — G9341 Metabolic encephalopathy: Secondary | ICD-10-CM | POA: Diagnosis not present

## 2021-11-10 DIAGNOSIS — I499 Cardiac arrhythmia, unspecified: Secondary | ICD-10-CM | POA: Diagnosis not present

## 2021-11-10 DIAGNOSIS — Z823 Family history of stroke: Secondary | ICD-10-CM

## 2021-11-10 DIAGNOSIS — N3000 Acute cystitis without hematuria: Secondary | ICD-10-CM

## 2021-11-10 DIAGNOSIS — Z8249 Family history of ischemic heart disease and other diseases of the circulatory system: Secondary | ICD-10-CM

## 2021-11-10 DIAGNOSIS — N200 Calculus of kidney: Secondary | ICD-10-CM | POA: Diagnosis not present

## 2021-11-10 DIAGNOSIS — R627 Adult failure to thrive: Secondary | ICD-10-CM | POA: Diagnosis present

## 2021-11-10 DIAGNOSIS — R509 Fever, unspecified: Secondary | ICD-10-CM

## 2021-11-10 DIAGNOSIS — E162 Hypoglycemia, unspecified: Secondary | ICD-10-CM | POA: Diagnosis not present

## 2021-11-10 DIAGNOSIS — K760 Fatty (change of) liver, not elsewhere classified: Secondary | ICD-10-CM | POA: Diagnosis present

## 2021-11-10 DIAGNOSIS — D72819 Decreased white blood cell count, unspecified: Secondary | ICD-10-CM

## 2021-11-10 DIAGNOSIS — Z6820 Body mass index (BMI) 20.0-20.9, adult: Secondary | ICD-10-CM | POA: Diagnosis not present

## 2021-11-10 DIAGNOSIS — E876 Hypokalemia: Secondary | ICD-10-CM | POA: Diagnosis not present

## 2021-11-10 DIAGNOSIS — R6889 Other general symptoms and signs: Secondary | ICD-10-CM | POA: Diagnosis not present

## 2021-11-10 DIAGNOSIS — E1122 Type 2 diabetes mellitus with diabetic chronic kidney disease: Secondary | ICD-10-CM | POA: Diagnosis present

## 2021-11-10 DIAGNOSIS — R410 Disorientation, unspecified: Secondary | ICD-10-CM | POA: Diagnosis not present

## 2021-11-10 DIAGNOSIS — Z833 Family history of diabetes mellitus: Secondary | ICD-10-CM

## 2021-11-10 DIAGNOSIS — I10 Essential (primary) hypertension: Secondary | ICD-10-CM | POA: Diagnosis present

## 2021-11-10 DIAGNOSIS — Z888 Allergy status to other drugs, medicaments and biological substances status: Secondary | ICD-10-CM

## 2021-11-10 DIAGNOSIS — I700 Atherosclerosis of aorta: Secondary | ICD-10-CM | POA: Diagnosis not present

## 2021-11-10 DIAGNOSIS — Z809 Family history of malignant neoplasm, unspecified: Secondary | ICD-10-CM

## 2021-11-10 DIAGNOSIS — S199XXA Unspecified injury of neck, initial encounter: Secondary | ICD-10-CM | POA: Diagnosis not present

## 2021-11-10 DIAGNOSIS — Z87891 Personal history of nicotine dependence: Secondary | ICD-10-CM

## 2021-11-10 LAB — URINALYSIS, ROUTINE W REFLEX MICROSCOPIC
Bilirubin Urine: NEGATIVE
Glucose, UA: NEGATIVE mg/dL
Ketones, ur: 5 mg/dL — AB
Leukocytes,Ua: NEGATIVE
Nitrite: POSITIVE — AB
Protein, ur: 100 mg/dL — AB
Specific Gravity, Urine: 1.014 (ref 1.005–1.030)
pH: 5 (ref 5.0–8.0)

## 2021-11-10 LAB — CBC WITH DIFFERENTIAL/PLATELET
Abs Immature Granulocytes: 0 10*3/uL (ref 0.00–0.07)
Basophils Absolute: 0 10*3/uL (ref 0.0–0.1)
Basophils Relative: 1 %
Eosinophils Absolute: 0 10*3/uL (ref 0.0–0.5)
Eosinophils Relative: 0 %
HCT: 41.1 % (ref 36.0–46.0)
Hemoglobin: 13.4 g/dL (ref 12.0–15.0)
Immature Granulocytes: 0 %
Lymphocytes Relative: 9 %
Lymphs Abs: 0.3 10*3/uL — ABNORMAL LOW (ref 0.7–4.0)
MCH: 27.9 pg (ref 26.0–34.0)
MCHC: 32.6 g/dL (ref 30.0–36.0)
MCV: 85.4 fL (ref 80.0–100.0)
Monocytes Absolute: 0.5 10*3/uL (ref 0.1–1.0)
Monocytes Relative: 14 %
Neutro Abs: 2.5 10*3/uL (ref 1.7–7.7)
Neutrophils Relative %: 76 %
Platelets: 223 10*3/uL (ref 150–400)
RBC: 4.81 MIL/uL (ref 3.87–5.11)
RDW: 13.5 % (ref 11.5–15.5)
WBC: 3.2 10*3/uL — ABNORMAL LOW (ref 4.0–10.5)
nRBC: 0 % (ref 0.0–0.2)

## 2021-11-10 LAB — RAPID URINE DRUG SCREEN, HOSP PERFORMED
Amphetamines: NOT DETECTED
Barbiturates: NOT DETECTED
Benzodiazepines: POSITIVE — AB
Cocaine: NOT DETECTED
Opiates: POSITIVE — AB
Tetrahydrocannabinol: NOT DETECTED

## 2021-11-10 LAB — COMPREHENSIVE METABOLIC PANEL
ALT: 38 U/L (ref 0–44)
AST: 54 U/L — ABNORMAL HIGH (ref 15–41)
Albumin: 4.2 g/dL (ref 3.5–5.0)
Alkaline Phosphatase: 39 U/L (ref 38–126)
Anion gap: 12 (ref 5–15)
BUN: 46 mg/dL — ABNORMAL HIGH (ref 6–20)
CO2: 26 mmol/L (ref 22–32)
Calcium: 8.7 mg/dL — ABNORMAL LOW (ref 8.9–10.3)
Chloride: 95 mmol/L — ABNORMAL LOW (ref 98–111)
Creatinine, Ser: 2.03 mg/dL — ABNORMAL HIGH (ref 0.44–1.00)
GFR, Estimated: 28 mL/min — ABNORMAL LOW (ref >60)
Glucose, Bld: 166 mg/dL — ABNORMAL HIGH (ref 70–99)
Potassium: 3.8 mmol/L (ref 3.5–5.1)
Sodium: 133 mmol/L — ABNORMAL LOW (ref 135–145)
Total Bilirubin: 0.7 mg/dL (ref 0.3–1.2)
Total Protein: 8 g/dL (ref 6.5–8.1)

## 2021-11-10 LAB — AMMONIA: Ammonia: 13 umol/L (ref 9–35)

## 2021-11-10 LAB — CBG MONITORING, ED
Glucose-Capillary: 208 mg/dL — ABNORMAL HIGH (ref 70–99)
Glucose-Capillary: 38 mg/dL — CL (ref 70–99)
Glucose-Capillary: 66 mg/dL — ABNORMAL LOW (ref 70–99)
Glucose-Capillary: 92 mg/dL (ref 70–99)

## 2021-11-10 LAB — LACTIC ACID, PLASMA
Lactic Acid, Venous: 1.1 mmol/L (ref 0.5–1.9)
Lactic Acid, Venous: 1 mmol/L (ref 0.5–1.9)

## 2021-11-10 LAB — SARS CORONAVIRUS 2 BY RT PCR: SARS Coronavirus 2 by RT PCR: POSITIVE — AB

## 2021-11-10 LAB — PROCALCITONIN: Procalcitonin: 0.11 ng/mL

## 2021-11-10 LAB — ETHANOL: Alcohol, Ethyl (B): <10 mg/dL (ref <10)

## 2021-11-10 LAB — GLUCOSE, CAPILLARY: Glucose-Capillary: 141 mg/dL — ABNORMAL HIGH (ref 70–99)

## 2021-11-10 LAB — TSH: TSH: 0.38 u[IU]/mL (ref 0.350–4.500)

## 2021-11-10 MED ORDER — ACETAMINOPHEN 500 MG PO TABS
1000.0000 mg | ORAL_TABLET | Freq: Once | ORAL | Status: AC
  Administered 2021-11-10: 1000 mg via ORAL
  Filled 2021-11-10: qty 2

## 2021-11-10 MED ORDER — ACETAMINOPHEN 650 MG RE SUPP: 650.0000 mg | Freq: Four times a day (QID) | RECTAL | Status: DC | PRN

## 2021-11-10 MED ORDER — ATORVASTATIN CALCIUM 20 MG PO TABS
20.0000 mg | ORAL_TABLET | Freq: Every day | ORAL | Status: DC
  Administered 2021-11-10: 20 mg via ORAL
  Filled 2021-11-10: qty 1

## 2021-11-10 MED ORDER — METOPROLOL TARTRATE 50 MG PO TABS
100.0000 mg | ORAL_TABLET | Freq: Two times a day (BID) | ORAL | Status: DC
  Administered 2021-11-10 – 2021-11-13 (×6): 100 mg via ORAL
  Filled 2021-11-10 (×6): qty 2

## 2021-11-10 MED ORDER — ENSURE ENLIVE PO LIQD: 237.0000 mL | Freq: Two times a day (BID) | ORAL | Status: DC

## 2021-11-10 MED ORDER — DEXTROSE 50 % IV SOLN
INTRAVENOUS | Status: AC
  Administered 2021-11-10: 25 g via INTRAVENOUS
  Filled 2021-11-10: qty 50

## 2021-11-10 MED ORDER — ONDANSETRON HCL 4 MG/2ML IJ SOLN
4.0000 mg | Freq: Four times a day (QID) | INTRAMUSCULAR | Status: DC | PRN
  Administered 2021-11-10: 4 mg via INTRAVENOUS
  Filled 2021-11-10: qty 2

## 2021-11-10 MED ORDER — SODIUM CHLORIDE 0.9 % IV BOLUS
500.0000 mL | Freq: Once | INTRAVENOUS | Status: AC
  Administered 2021-11-10: 500 mL via INTRAVENOUS

## 2021-11-10 MED ORDER — DOXYCYCLINE HYCLATE 100 MG PO TABS
100.0000 mg | ORAL_TABLET | Freq: Once | ORAL | Status: AC
  Administered 2021-11-10: 100 mg via ORAL
  Filled 2021-11-10: qty 1

## 2021-11-10 MED ORDER — HEPARIN SODIUM (PORCINE) 5000 UNIT/ML IJ SOLN
5000.0000 [IU] | Freq: Three times a day (TID) | INTRAMUSCULAR | Status: DC
  Administered 2021-11-10 – 2021-11-13 (×8): 5000 [IU] via SUBCUTANEOUS
  Filled 2021-11-10 (×8): qty 1

## 2021-11-10 MED ORDER — ONDANSETRON HCL 4 MG PO TABS: 4.0000 mg | ORAL_TABLET | Freq: Four times a day (QID) | ORAL | Status: DC | PRN

## 2021-11-10 MED ORDER — ASPIRIN 81 MG PO CHEW
81.0000 mg | CHEWABLE_TABLET | Freq: Every day | ORAL | Status: DC
  Administered 2021-11-10 – 2021-11-13 (×4): 81 mg via ORAL
  Filled 2021-11-10 (×4): qty 1

## 2021-11-10 MED ORDER — INSULIN ASPART 100 UNIT/ML IJ SOLN
0.0000 [IU] | Freq: Three times a day (TID) | INTRAMUSCULAR | Status: DC
  Administered 2021-11-11 – 2021-11-12 (×2): 3 [IU] via SUBCUTANEOUS
  Administered 2021-11-12: 11 [IU] via SUBCUTANEOUS
  Administered 2021-11-12: 3 [IU] via SUBCUTANEOUS
  Administered 2021-11-13: 2 [IU] via SUBCUTANEOUS

## 2021-11-10 MED ORDER — ALPRAZOLAM 0.5 MG PO TABS
0.5000 mg | ORAL_TABLET | Freq: Three times a day (TID) | ORAL | Status: DC | PRN
  Administered 2021-11-11 (×2): 0.5 mg via ORAL
  Filled 2021-11-10 (×2): qty 1

## 2021-11-10 MED ORDER — POLYETHYLENE GLYCOL 3350 17 G PO PACK: 17.0000 g | PACK | Freq: Every day | ORAL | Status: DC | PRN

## 2021-11-10 MED ORDER — SODIUM CHLORIDE 0.9 % IV SOLN
1.0000 g | Freq: Once | INTRAVENOUS | Status: AC
  Administered 2021-11-10: 1 g via INTRAVENOUS
  Filled 2021-11-10: qty 10

## 2021-11-10 MED ORDER — ACETAMINOPHEN 325 MG PO TABS
650.0000 mg | ORAL_TABLET | Freq: Four times a day (QID) | ORAL | Status: DC | PRN
  Administered 2021-11-12: 650 mg via ORAL
  Filled 2021-11-10: qty 2

## 2021-11-10 MED ORDER — LEVOTHYROXINE SODIUM 25 MCG PO TABS
25.0000 ug | ORAL_TABLET | Freq: Every day | ORAL | Status: DC
  Administered 2021-11-11 – 2021-11-13 (×3): 25 ug via ORAL
  Filled 2021-11-10 (×3): qty 1

## 2021-11-10 MED ORDER — INSULIN ASPART 100 UNIT/ML IJ SOLN: 0.0000 [IU] | Freq: Every day | INTRAMUSCULAR | Status: DC

## 2021-11-10 MED ORDER — SODIUM CHLORIDE 0.9 % IV SOLN: INTRAVENOUS | Status: DC

## 2021-11-10 MED ORDER — DEXTROSE 50 % IV SOLN: 25.0000 g | INTRAVENOUS | Status: AC

## 2021-11-10 MED ORDER — SODIUM CHLORIDE 0.9 % IV BOLUS
500.0000 mL | Freq: Once | INTRAVENOUS | Status: AC
Start: 2021-11-10 — End: 2021-11-10
  Administered 2021-11-10: 500 mL via INTRAVENOUS

## 2021-11-10 MED ORDER — OXYCODONE HCL 5 MG PO TABS
5.0000 mg | ORAL_TABLET | ORAL | Status: DC | PRN
  Administered 2021-11-11 – 2021-11-13 (×4): 5 mg via ORAL
  Filled 2021-11-10 (×4): qty 1

## 2021-11-10 MED ORDER — GABAPENTIN 300 MG PO CAPS
600.0000 mg | ORAL_CAPSULE | Freq: Two times a day (BID) | ORAL | Status: DC
  Administered 2021-11-10 – 2021-11-13 (×6): 600 mg via ORAL
  Filled 2021-11-10 (×6): qty 2

## 2021-11-10 MED ORDER — GABAPENTIN 300 MG PO CAPS: 600.0000 mg | ORAL_CAPSULE | Freq: Three times a day (TID) | ORAL | Status: DC

## 2021-11-10 MED ORDER — SODIUM CHLORIDE 0.9 % IV SOLN: 2.0000 g | INTRAVENOUS | Status: DC

## 2021-11-10 NOTE — Assessment & Plan Note (Signed)
-  Febrile, leukopenic, and AKI -UA suspicious for UTI -Urine culture and blood culture pending -Procalcitonin pending -Lactic acid pending -Previous micro shows E Coli sensitive to Rocephin -Continue Rocephin -Continue to monitor

## 2021-11-10 NOTE — Assessment & Plan Note (Signed)
-  Urine culture pending -Continue Rocephin -see plan for sepsis

## 2021-11-10 NOTE — Assessment & Plan Note (Signed)
-  continue lopressor

## 2021-11-10 NOTE — ED Provider Notes (Addendum)
[facility] EMERGENCY DEPARTMENT Provider Note   CSN: [id number] Arrival date & time: 11/10/21  1135     History  Chief Complaint  Patient presents with   Altered Mental Status    Anita [name] is a 61 y.o. female.  Patient presents with EMS from home due to confusion since falling last night.  Patient does not recall details last night or this morning.  Patient denies pain except for mild neck pain with movement.  Patient feels generally weak but no unilateral signs or symptoms.  No change in medication.  Denies alcohol or drugs.  Last memories from lunchtime yesterday.  Per her significant other patient's had decreased appetite for some time.  Per significant other patient is on metformin and glipizide.  No fevers recently.       Home Medications Prior to Admission medications   Medication Sig Start Date End Date Taking? Authorizing Provider  ALPRAZolam Duanne Moron) 1 MG tablet Take 0.5 mg by mouth in the morning, at noon, in the evening, and at bedtime.   Yes [provider]  aspirin 81 MG chewable tablet Chew 81 mg by mouth daily.   Yes [provider]  atorvastatin (LIPITOR) 20 MG tablet Take 20 mg by mouth daily. 1 Tablet Daily   Yes [provider]  Cholecalciferol (VITAMIN D-3) 125 MCG (5000 UT) TABS Take 5,000 Units by mouth daily.   Yes [provider]  gabapentin (NEURONTIN) 600 MG tablet Take 600 mg by mouth 3 (three) times daily. 10/27/21  Yes [provider]  glipiZIDE (GLUCOTROL) 5 MG tablet Take 5 mg by mouth 3 (three) times daily. 06/29/19  Yes [provider]  levothyroxine (SYNTHROID) 25 MCG tablet Take 25 mcg by mouth daily. 10/05/21  Yes [provider]  metFORMIN (GLUCOPHAGE) 500 MG tablet Take 500 mg by mouth 2 (two) times daily with a meal.   Yes [provider]  metoprolol tartrate (LOPRESSOR) 100 MG tablet Take 100 mg by mouth 2 (two) times a day. 06/15/18  Yes [provider]  oxyCODONE  (OXY IR/ROXICODONE) 5 MG immediate release tablet Take 15 mg by mouth every 6 (six) hours as needed (pain.). 08/29/19  Yes [provider]  Potassium 99 MG TABS Take 99 mg by mouth daily.   Yes [provider]  tiZANidine (ZANAFLEX) 4 MG tablet Take 4 mg by mouth in the morning, at noon, and at bedtime. 11/21/14  Yes [provider]  EPINEPHrine 0.3 mg/0.3 mL IJ SOAJ injection Inject 0.3 mg into the muscle as needed for anaphylaxis. Inject as needed for anaphlaxis    [provider]      Allergies    Ramipril, Imitrex [sumatriptan], Lyrica [pregabalin], and Penicillins    Review of Systems   Review of Systems  Unable to perform ROS: Mental status change    Physical Exam Updated Vital Signs BP (!) 181/87   Pulse 84   Temp 99.3 F (37.4 C) (Oral)   Resp 16   Ht '5\' 5"'$  (1.651 m)   Wt 59 kg   SpO2 93%   BMI 21.63 kg/m  Physical Exam Vitals and nursing note reviewed.  Constitutional:      General: She is not in acute distress.    Appearance: She is well-developed.  HENT:     Head: Normocephalic and atraumatic.     Mouth/Throat:     Mouth: Mucous membranes are dry.  Eyes:     General:  Right eye: No discharge.        Left eye: No discharge.     Conjunctiva/sclera: Conjunctivae normal.  Neck:     Trachea: No tracheal deviation.     Comments: Mild paraspinal and midline mid cervical tenderness supple, no significant pain with horizontal range of motion. Cardiovascular:     Rate and Rhythm: Normal rate and regular rhythm.  Pulmonary:     Effort: Pulmonary effort is normal.     Breath sounds: Normal breath sounds.  Abdominal:     General: There is no distension.     Palpations: Abdomen is soft.     Tenderness: There is no abdominal tenderness. There is no guarding.  Musculoskeletal:        General: Tenderness present. No swelling.     Cervical back: Normal range of motion and neck supple. No rigidity.  Skin:    General: Skin is  warm.     Capillary Refill: Capillary refill takes less than 2 seconds.     Findings: No rash.  Neurological:     General: No focal deficit present.     Mental Status: She is alert.     Comments: Generally confused on arrival, no arm or leg drift, equal strength bilateral with general weakness.  Pupils equal extraocular muscle function intact.  Visual fields intact.  Psychiatric:     Comments: General confusion     ED Results / Procedures / Treatments   Labs (all labs ordered are listed, but only abnormal results are displayed) Labs Reviewed  COMPREHENSIVE METABOLIC PANEL - Abnormal; Notable for the following components:      Result Value   Sodium 133 (*)    Chloride 95 (*)    Glucose, Bld 166 (*)    BUN 46 (*)    Creatinine, Ser 2.03 (*)    Calcium 8.7 (*)    AST 54 (*)    GFR, Estimated 28 (*)    All other components within normal limits  CBC WITH DIFFERENTIAL/PLATELET - Abnormal; Notable for the following components:   WBC 3.2 (*)    Lymphs Abs 0.3 (*)    All other components within normal limits  CBG MONITORING, ED - Abnormal; Notable for the following components:   Glucose-Capillary 38 (*)    All other components within normal limits  CBG MONITORING, ED - Abnormal; Notable for the following components:   Glucose-Capillary 208 (*)    All other components within normal limits  ETHANOL  AMMONIA  URINALYSIS, ROUTINE W REFLEX MICROSCOPIC  RAPID URINE DRUG SCREEN, HOSP PERFORMED    EKG None  Radiology DG Chest 1 View  Result Date: 11/10/2021 CLINICAL DATA:  Provided history: Altered mental status after falling last night. EXAM: CHEST  1 VIEW COMPARISON:  Prior chest radiographs 09/20/2018 and earlier. FINDINGS: Prior median sternotomy. Heart size within normal limits. No appreciable airspace consolidation or pulmonary edema. No evidence of pleural effusion or pneumothorax. No acute bony abnormality identified. IMPRESSION: No evidence of acute cardiopulmonary  abnormality. Electronically Signed   By: Kellie Simmering D.O.   On: 11/10/2021 13:07   CT Head Wo Contrast  Result Date: 11/10/2021 CLINICAL DATA:  Neck pain, acute, no red flags fall, neck pain; Head trauma, abnormal mental status (Age 85-64y) fall, ams EXAM: CT HEAD WITHOUT CONTRAST CT CERVICAL SPINE WITHOUT CONTRAST TECHNIQUE: Multidetector CT imaging of the head and cervical spine was performed following the standard protocol without intravenous contrast. Multiplanar CT image reconstructions of the cervical spine were also  generated. RADIATION DOSE REDUCTION: This exam was performed according to the departmental dose-optimization program which includes automated exposure control, adjustment of the mA and/or kV according to patient size and/or use of iterative reconstruction technique. COMPARISON:  None Available. FINDINGS: CT HEAD FINDINGS Brain: No evidence of acute infarction, hemorrhage, hydrocephalus, extra-axial collection or mass lesion/mass effect. Patchy white matter hypodensities, nonspecific but compatible with chronic microvascular ischemic disease. Vascular: No hyperdense vessel identified. Skull: No acute fracture. Sinuses/Orbits: Clear sinuses.  No acute orbital findings. Other: No mastoid effusions. CT CERVICAL SPINE FINDINGS Alignment: Straightening.  No substantial sagittal subluxation. Skull base and vertebrae: Vertebral heights are maintained. No evidence of acute fracture. Partial fusion across the C2-C3 disc space. Soft tissues and spinal canal: No prevertebral fluid or swelling. No visible canal hematoma. Disc levels: Multilevel facet and uncovertebral hypertrophy and bruise neural foraminal stenosis. Upper chest: Visualized lung apices are clear. Other: Thyroid nodules, measuring up to 1 cm. No follow-up imaging recommended (ref: J Am Coll Radiol. 2015 Feb;12(2): 143-50). IMPRESSION: 1. No evidence of acute intracranial abnormality. 2. No evidence of acute fracture or traumatic  malalignment in the cervical spine. Electronically Signed   By: Margaretha Sheffield M.D.   On: 11/10/2021 13:05   CT Cervical Spine Wo Contrast  Result Date: 11/10/2021 CLINICAL DATA:  Neck pain, acute, no red flags fall, neck pain; Head trauma, abnormal mental status (Age 76-64y) fall, ams EXAM: CT HEAD WITHOUT CONTRAST CT CERVICAL SPINE WITHOUT CONTRAST TECHNIQUE: Multidetector CT imaging of the head and cervical spine was performed following the standard protocol without intravenous contrast. Multiplanar CT image reconstructions of the cervical spine were also generated. RADIATION DOSE REDUCTION: This exam was performed according to the departmental dose-optimization program which includes automated exposure control, adjustment of the mA and/or kV according to patient size and/or use of iterative reconstruction technique. COMPARISON:  None Available. FINDINGS: CT HEAD FINDINGS Brain: No evidence of acute infarction, hemorrhage, hydrocephalus, extra-axial collection or mass lesion/mass effect. Patchy white matter hypodensities, nonspecific but compatible with chronic microvascular ischemic disease. Vascular: No hyperdense vessel identified. Skull: No acute fracture. Sinuses/Orbits: Clear sinuses.  No acute orbital findings. Other: No mastoid effusions. CT CERVICAL SPINE FINDINGS Alignment: Straightening.  No substantial sagittal subluxation. Skull base and vertebrae: Vertebral heights are maintained. No evidence of acute fracture. Partial fusion across the C2-C3 disc space. Soft tissues and spinal canal: No prevertebral fluid or swelling. No visible canal hematoma. Disc levels: Multilevel facet and uncovertebral hypertrophy and bruise neural foraminal stenosis. Upper chest: Visualized lung apices are clear. Other: Thyroid nodules, measuring up to 1 cm. No follow-up imaging recommended (ref: J Am Coll Radiol. 2015 Feb;12(2): 143-50). IMPRESSION: 1. No evidence of acute intracranial abnormality. 2. No evidence of  acute fracture or traumatic malalignment in the cervical spine. Electronically Signed   By: Margaretha Sheffield M.D.   On: 11/10/2021 13:05    Procedures Procedures    Medications Ordered in ED Medications  dextrose 50 % solution 25 g (25 g Intravenous Given by Other 11/10/21 1201)  sodium chloride 0.9 % bolus 500 mL (500 mLs Intravenous New Bag/Given 11/10/21 1248)    ED Course/ Medical Decision Making/ A&P                           Medical Decision Making Amount and/or Complexity of Data Reviewed Labs: ordered. Radiology: ordered.  Risk OTC drugs. Prescription drug management.   Patient presents with altered mental status last known normal  yesterday afternoon.  Initially for nursing patient was showing signs of neglect and abnormal neuro exam thus glucose check.  Sugar was 38 which explains why patient has been altered since yesterday.  Medical records and medications reviewed patient has been taking glipizide and metformin last dose yesterday. Family arrived to provide details this patient's had decreased appetite for weeks. Differential broad for presentation including metabolic, hypoglycemia, medication/polypharmacy as patient is on Xanax, pain meds at home.  Electrolyte abnormalities, intracranial bleeding, stroke, urine infection, renal failure, other.  Blood work ordered and independently reviewed showing mild hyponatremia 133, IV fluid bolus given for decreased appetite as well and mild dehydration, mild hypochloremia 95.  Acute renal failure creatinine 2.03.  Glucose 38 on arrival improved to 238 with D50 bolus.  CT scan of the head and neck no acute bleeding stroke or fracture independently reviewed.  Ethanol and ammonia levels reviewed normal.  Patient will need to hold hypoglycemic medications.  Patient generally weak in the ER.  Plan for admission/observation for further treatment.  Urinalysis pending.   On reassessment patient more awake and alert, daughter thereafter  provide further details.  Patient has had worsening abdominal pain nonfocal over the past few days, decreased appetite gradually worsening, weight loss, memory loss.  Thyroid testing added and CT abdomen pelvis without contrast due to worsening kidney function.  Patient care will be signed out to follow-up results and plan for likely admission.     Final Clinical Impression(s) / ED Diagnoses Final diagnoses:  Hypoglycemia  Acute encephalopathy  Acute renal failure, unspecified acute renal failure type ([place])  Hyponatremia  Leukopenia, unspecified type    Rx / DC Orders ED Discharge Orders     None         Elnora Morrison, MD 11/10/21 1416    Elnora Morrison, MD 11/10/21 1417    Elnora Morrison, MD 11/10/21 1637    Elnora Morrison, MD 11/10/21 1717

## 2021-11-10 NOTE — ED Notes (Signed)
Pt slightly confused and neglecting right arm with double simultaneous stimuli. LKW time unclear at this time. Dr. Reather Converse called and notified of possible CODE STROKE.

## 2021-11-10 NOTE — Assessment & Plan Note (Signed)
-  Continue Lipitor °

## 2021-11-10 NOTE — ED Notes (Signed)
Pt to CT

## 2021-11-10 NOTE — Assessment & Plan Note (Signed)
-   Described as confusion -CT head negative for acute finding - Most likely secondary to UTI - See plan for sepsis

## 2021-11-10 NOTE — ED Triage Notes (Addendum)
Pt brought in by [facility] from home with c/o AMS since falling last night. Pt doesn't recall falling and denies any pain. Pt reports last feeling normal around lunchtime yesterday and reports the confusion started after the fall, although she doesn't remember falling. Pt alert and oriented to name (but not DOB) and place, disoriented to time and situation.
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
## 2021-11-10 NOTE — ED Provider Notes (Signed)
61 yo female here with hypoglycemia, fever, weight loss, failure to thrive.  Hypoglycemia resolved with dextrose  Pt on metformin and glipizide a home  Pending CT abdomen & admission    *  Admitted to hospitalist for UTI, PNA.   Wyvonnia Dusky, MD 11/11/21 [phone]

## 2021-11-10 NOTE — Assessment & Plan Note (Signed)
-  Hold oral hypoglycemics -Sliding scale coverage -continue gabapentin -Carb modified diet

## 2021-11-10 NOTE — Assessment & Plan Note (Signed)
-  Cr up to 2.03 from 1.78 -2/2 sepsis -1L bolus in ED -Continue IV hydration -avoid nephrotoxic agents when possible -Trend in the AM

## 2021-11-10 NOTE — ED Notes (Signed)
Dr. Reather Converse notified of CBG of 38 and nursing giving standing order of D50 IV. EDP coming to bedside to assess pt.

## 2021-11-11 DIAGNOSIS — U071 COVID-19: Secondary | ICD-10-CM | POA: Diagnosis present

## 2021-11-11 DIAGNOSIS — G9341 Metabolic encephalopathy: Secondary | ICD-10-CM

## 2021-11-11 DIAGNOSIS — I161 Hypertensive emergency: Secondary | ICD-10-CM | POA: Diagnosis present

## 2021-11-11 DIAGNOSIS — E440 Moderate protein-calorie malnutrition: Secondary | ICD-10-CM | POA: Insufficient documentation

## 2021-11-11 LAB — MRSA NEXT GEN BY PCR, NASAL: MRSA by PCR Next Gen: NOT DETECTED

## 2021-11-11 LAB — CBC WITH DIFFERENTIAL/PLATELET
Abs Immature Granulocytes: 0.01 10*3/uL (ref 0.00–0.07)
Basophils Absolute: 0 10*3/uL (ref 0.0–0.1)
Basophils Relative: 0 %
Eosinophils Absolute: 0 10*3/uL (ref 0.0–0.5)
Eosinophils Relative: 1 %
HCT: 39.3 % (ref 36.0–46.0)
Hemoglobin: 12 g/dL (ref 12.0–15.0)
Immature Granulocytes: 0 %
Lymphocytes Relative: 15 %
Lymphs Abs: 0.4 10*3/uL — ABNORMAL LOW (ref 0.7–4.0)
MCH: 27.3 pg (ref 26.0–34.0)
MCHC: 30.5 g/dL (ref 30.0–36.0)
MCV: 89.3 fL (ref 80.0–100.0)
Monocytes Absolute: 0.5 10*3/uL (ref 0.1–1.0)
Monocytes Relative: 19 %
Neutro Abs: 1.8 10*3/uL (ref 1.7–7.7)
Neutrophils Relative %: 65 %
Platelets: 200 10*3/uL (ref 150–400)
RBC: 4.4 MIL/uL (ref 3.87–5.11)
RDW: 13.3 % (ref 11.5–15.5)
WBC: 2.7 10*3/uL — ABNORMAL LOW (ref 4.0–10.5)
nRBC: 0 % (ref 0.0–0.2)

## 2021-11-11 LAB — COMPREHENSIVE METABOLIC PANEL
ALT: 33 U/L (ref 0–44)
AST: 46 U/L — ABNORMAL HIGH (ref 15–41)
Albumin: 3.5 g/dL (ref 3.5–5.0)
Alkaline Phosphatase: 32 U/L — ABNORMAL LOW (ref 38–126)
Anion gap: 9 (ref 5–15)
BUN: 34 mg/dL — ABNORMAL HIGH (ref 6–20)
CO2: 26 mmol/L (ref 22–32)
Calcium: 8.3 mg/dL — ABNORMAL LOW (ref 8.9–10.3)
Chloride: 102 mmol/L (ref 98–111)
Creatinine, Ser: 1.39 mg/dL — ABNORMAL HIGH (ref 0.44–1.00)
GFR, Estimated: 43 mL/min — ABNORMAL LOW (ref >60)
Glucose, Bld: 100 mg/dL — ABNORMAL HIGH (ref 70–99)
Potassium: 3.9 mmol/L (ref 3.5–5.1)
Sodium: 137 mmol/L (ref 135–145)
Total Bilirubin: 0.4 mg/dL (ref 0.3–1.2)
Total Protein: 6.9 g/dL (ref 6.5–8.1)

## 2021-11-11 LAB — HIV ANTIBODY (ROUTINE TESTING W REFLEX): HIV Screen 4th Generation wRfx: NONREACTIVE

## 2021-11-11 LAB — PROTIME-INR
INR: 1 (ref 0.8–1.2)
Prothrombin Time: 12.9 seconds (ref 11.4–15.2)

## 2021-11-11 LAB — GLUCOSE, CAPILLARY
Glucose-Capillary: 96 mg/dL (ref 70–99)
Glucose-Capillary: 157 mg/dL — ABNORMAL HIGH (ref 70–99)
Glucose-Capillary: 103 mg/dL — ABNORMAL HIGH (ref 70–99)
Glucose-Capillary: 103 mg/dL — ABNORMAL HIGH (ref 70–99)

## 2021-11-11 LAB — MAGNESIUM: Magnesium: 1.5 mg/dL — ABNORMAL LOW (ref 1.7–2.4)

## 2021-11-11 LAB — HEMOGLOBIN A1C
Hgb A1c MFr Bld: 5.2 % (ref 4.8–5.6)
Mean Plasma Glucose: 102.54 mg/dL

## 2021-11-11 LAB — CORTISOL-AM, BLOOD: Cortisol - AM: 14.5 ug/dL (ref 6.7–22.6)

## 2021-11-11 MED ORDER — HYDRALAZINE HCL 20 MG/ML IJ SOLN
10.0000 mg | Freq: Once | INTRAMUSCULAR | Status: AC
  Administered 2021-11-11: 10 mg via INTRAVENOUS
  Filled 2021-11-11: qty 1

## 2021-11-11 MED ORDER — SODIUM CHLORIDE 0.9 % IV SOLN
1.0000 g | INTRAVENOUS | Status: AC
  Administered 2021-11-11 – 2021-11-12 (×2): 1 g via INTRAVENOUS
  Filled 2021-11-11 (×2): qty 10

## 2021-11-11 MED ORDER — MAGNESIUM SULFATE 2 GM/50ML IV SOLN
2.0000 g | Freq: Once | INTRAVENOUS | Status: AC
  Administered 2021-11-11: 2 g via INTRAVENOUS
  Filled 2021-11-11: qty 50

## 2021-11-11 MED ORDER — NITROGLYCERIN IN D5W 200-5 MCG/ML-% IV SOLN
0.0000 ug/min | INTRAVENOUS | Status: DC
  Administered 2021-11-11: 5 ug/min via INTRAVENOUS
  Filled 2021-11-11: qty 250

## 2021-11-11 MED ORDER — ZINC SULFATE 220 (50 ZN) MG PO CAPS
220.0000 mg | ORAL_CAPSULE | Freq: Every day | ORAL | Status: DC
  Administered 2021-11-11 – 2021-11-13 (×3): 220 mg via ORAL
  Filled 2021-11-11 (×3): qty 1

## 2021-11-11 MED ORDER — NIRMATRELVIR/RITONAVIR (PAXLOVID)TABLET
2.0000 | ORAL_TABLET | Freq: Two times a day (BID) | ORAL | Status: DC
  Filled 2021-11-11: qty 30

## 2021-11-11 MED ORDER — AMLODIPINE BESYLATE 5 MG PO TABS
10.0000 mg | ORAL_TABLET | Freq: Every day | ORAL | Status: DC
Start: 2021-11-11 — End: 2021-11-13
  Administered 2021-11-11 – 2021-11-13 (×3): 10 mg via ORAL
  Filled 2021-11-11 (×3): qty 2

## 2021-11-11 MED ORDER — ASCORBIC ACID 500 MG PO TABS
500.0000 mg | ORAL_TABLET | Freq: Every day | ORAL | Status: DC
  Administered 2021-11-11 – 2021-11-13 (×3): 500 mg via ORAL
  Filled 2021-11-11 (×3): qty 1

## 2021-11-11 MED ORDER — NIRMATRELVIR/RITONAVIR (PAXLOVID) TABLET (RENAL DOSING)
2.0000 | ORAL_TABLET | Freq: Two times a day (BID) | ORAL | Status: DC
  Administered 2021-11-11 – 2021-11-13 (×5): 2 via ORAL
  Filled 2021-11-11: qty 20

## 2021-11-11 MED ORDER — CHLORHEXIDINE GLUCONATE CLOTH 2 % EX PADS
6.0000 | MEDICATED_PAD | Freq: Every day | CUTANEOUS | Status: DC
  Administered 2021-11-11 – 2021-11-13 (×3): 6 via TOPICAL

## 2021-11-11 MED ORDER — HYDROCORTISONE ACETATE 25 MG RE SUPP
25.0000 mg | Freq: Two times a day (BID) | RECTAL | Status: DC
  Administered 2021-11-11 – 2021-11-12 (×4): 25 mg via RECTAL
  Filled 2021-11-11 (×5): qty 1

## 2021-11-11 MED ORDER — HYDRALAZINE HCL 20 MG/ML IJ SOLN
10.0000 mg | Freq: Four times a day (QID) | INTRAMUSCULAR | Status: DC | PRN
  Administered 2021-11-11: 10 mg via INTRAVENOUS
  Filled 2021-11-11: qty 1

## 2021-11-11 MED ORDER — ADULT MULTIVITAMIN W/MINERALS CH
1.0000 | ORAL_TABLET | Freq: Every day | ORAL | Status: DC
  Administered 2021-11-11 – 2021-11-13 (×3): 1 via ORAL
  Filled 2021-11-11 (×3): qty 1

## 2021-11-11 NOTE — Progress Notes (Signed)
PROGRESS NOTE     Anita [name], is a 61 y.o. female, DOB - Jan 06, 1961, XNA:[id number]  Admit date - 11/10/2021   Admitting Physician Rolla Plate, DO  Outpatient Primary MD for the patient is Redmond School, MD  LOS - 1  Chief Complaint  Patient presents with   Altered Mental Status        Brief Narrative:  61 y.o. female with medical history significant for DM, anxiety, atrial septal defect, CAD, HTN, HLD, peripheral neuropathy, h/o prior stroke admitted with acute metabolic encephalopathy in the setting of severely elevated blood pressures.  Blood pressure over 230 mmhg and COVID-19 positive status    -Assessment and Plan: * Acute metabolic encephalopathy - Multifactorial suspect some component of hypertensive emergency with systolic blood pressure over 230 mmHg -CT head negative for acute finding -No acute focal neurodeficits at this time -Consider MRI brain if further neuro concerns -Cannot rule out contributory effect of COVID-19 infection with fevers and possible UTI  Hypertensive crisis-- -Systolic BP was over 542 mmHg -BP remains elevated despite p.o. amlodipine and p.o. metoprolol, as well as IV hydralazine  -Given concerns about mentation in a patient with history of prior stroke -We will start IV nitro drip for better BP control  Diabetes mellitus with neuropathy (HCC) -Hold oral hypoglycemics A1c is 5.3 reflecting excellent diabetic control PTA -continue gabapentin Use Novolog/Humalog Sliding scale insulin with Accu-Cheks/Fingersticks as ordered   Possible UTI--- continue Rocephin pending culture data  COVID-19 infection--- clinically and radiologically no COVID-pneumonia, no hypoxia -Patient with fevers and confusional episodes -Treat empirically with Paxlovid -Follow inflammatory markers  Leukopenia--- most likely related to COVID-19 viral infection   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 Barriers: Not Clinically Stable-   Code Status :  -  Code Status: Full Code   Family Communication: Husband is primary contact  DVT Prophylaxis  :   - SCDs *  heparin injection 5,000 Units Start: 11/10/21 2200 SCDs Start: 11/10/21 2114   Lab Results  Component Value Date   PLT 200 11/11/2021    Inpatient Medications  Scheduled Meds:  amLODipine  10 mg Oral Daily   vitamin C  500 mg Oral Daily   aspirin  81 mg Oral Daily   Chlorhexidine Gluconate Cloth  6 each Topical Daily   feeding supplement  237 mL Oral BID BM   gabapentin  600 mg Oral BID   heparin  5,000 Units Subcutaneous Q8H   hydrocortisone  25 mg Rectal BID   insulin aspart  0-15 Units Subcutaneous TID WC   insulin aspart  0-5 Units Subcutaneous QHS   levothyroxine  25 mcg Oral Q0600   metoprolol tartrate  100 mg Oral BID   multivitamin with minerals  1 tablet Oral Daily   nirmatrelvir/ritonavir EUA (renal dosing)  2 tablet Oral BID   zinc sulfate  220 mg Oral Daily   Continuous Infusions:  sodium chloride 75 mL/hr at 11/11/21 1231   cefTRIAXone (ROCEPHIN)  IV 1 g (11/11/21 1613)   nitroGLYCERIN     PRN Meds:.acetaminophen **OR** acetaminophen, ALPRAZolam, hydrALAZINE, ondansetron **OR** ondansetron (ZOFRAN) IV, oxyCODONE, polyethylene glycol   Anti-infectives (From admission, onward)    Start     Dose/Rate Route Frequency Ordered Stop   11/11/21 1700  cefTRIAXone (ROCEPHIN) 2 g in sodium chloride 0.9 % 100 mL  IVPB  Status:  Discontinued        2 g 200 mL/hr over 30 Minutes Intravenous Every 24 hours 11/10/21 2113 11/11/21 0950   11/11/21 1700  cefTRIAXone (ROCEPHIN) 1 g in sodium chloride 0.9 % 100 mL IVPB        1 g 200 mL/hr over 30 Minutes Intravenous Every 24 hours 11/11/21 0950 11/18/21 1659   11/11/21 1230  nirmatrelvir/ritonavir EUA (renal dosing) (PAXLOVID) 2 tablet        2 tablet Oral 2 times daily 11/11/21 1141 11/16/21 0959   11/11/21 1000  nirmatrelvir/ritonavir EUA (PAXLOVID) 2 tablet  Status:  Discontinued        2 tablet Oral 2 times daily 11/11/21 0741 11/11/21 1141   11/10/21 1745  cefTRIAXone (ROCEPHIN) 1 g in sodium chloride 0.9 % 100 mL IVPB        1 g 200 mL/hr over 30 Minutes Intravenous  Once 11/10/21 1734 11/10/21 1907   11/10/21 1745  doxycycline (VIBRA-TABS) tablet 100 mg        100 mg Oral  Once 11/10/21 1740 11/10/21 1830         Subjective: Jearldine Erdahl today has  no emesis,  No chest pain,   -Fevers resolving -No productive cough -No headaches or visual disturbance or focal weakness -Patient with global confusion and disorientation from time to time   Objective: Vitals:   11/11/21 1403 11/11/21 1446 11/11/21 1500 11/11/21 1534  BP: (!) 184/73 (!) 205/71 (!) 180/70 (!) 188/71  Pulse:   72 74  Resp:   19 16  Temp:    98.8 F (37.1 C)  TempSrc:    Oral  SpO2:   95% 96%  Weight:      Height:        Intake/Output Summary (Last 24 hours) at 11/11/2021 1631 Last data filed at 11/11/2021 1356 Gross per 24 hour  Intake 1857.14 ml  Output --  Net 1857.14 ml   Filed Weights   11/10/21 1149 11/10/21 2105  Weight: 59 kg 55.5 kg    Physical Exam  Gen:- Awake Alert, in no acute distress HEENT:- [place].AT, No sclera icterus Neck-Supple Neck,No JVD,.  Lungs-  CTAB , fair symmetrical air movement CV- S1, S2 normal, regular  Abd-  +ve B.Sounds, Abd Soft, No tenderness,    Extremity/Skin:- No  edema, pedal pulses present  NeuroPsych---No headaches or  visual disturbance or focal weakness -Patient with global confusion and disorientation from time to time  Data Reviewed: I have personally reviewed following labs and imaging studies  CBC: Recent Labs  Lab 11/10/21 1246 11/11/21 0345  WBC 3.2* 2.7*  NEUTROABS 2.5 1.8  HGB 13.4 12.0  HCT 41.1 39.3  MCV 85.4 89.3  PLT 223 497   Basic Metabolic Panel: Recent Labs  Lab 11/10/21 1246 11/11/21 0345  NA 133* 137  K 3.8 3.9  CL 95* 102  CO2 26 26  GLUCOSE 166* 100*  BUN 46* 34*  CREATININE 2.03* 1.39*  CALCIUM 8.7* 8.3*  MG  --  1.5*   GFR: Estimated Creatinine Clearance: 37.7 mL/min (A) (by C-G formula based on SCr of 1.39 mg/dL (H)). Liver Function Tests: Recent Labs  Lab 11/10/21 1246 11/11/21 0345  AST 54* 46*  ALT 38 33  ALKPHOS 39 32*  BILITOT 0.7 0.4  PROT 8.0 6.9  ALBUMIN 4.2 3.5   Cardiac Enzymes: No results for input(s): "CKTOTAL", "CKMB", "CKMBINDEX", "TROPONINI" in the last 168 hours. BNP (last 3 results) No results for input(s): "PROBNP" in the last 8760 hours. HbA1C: Recent Labs    11/11/21 0345  HGBA1C 5.2   Sepsis Labs: '@LABRCNTIP'$ (procalcitonin:4,lacticidven:4) ) Recent Results (from the past 240 hour(s))  SARS Coronavirus 2 by RT PCR (hospital order, performed in [facility] hospital lab) *cepheid single result test* Anterior Nasal Swab     Status: Abnormal   Collection Time: 11/10/21  5:15 PM   Specimen: Anterior Nasal Swab  Result Value Ref Range Status   SARS Coronavirus 2 by RT PCR POSITIVE (A) NEGATIVE Final    Comment: (NOTE) SARS-CoV-2 target nucleic acids are DETECTED  SARS-CoV-2 RNA is generally detectable in upper respiratory specimens  during the acute phase of infection.  Positive results are indicative  of the presence of the identified virus, but do not rule out bacterial infection or co-infection with other pathogens not detected by the test.  Clinical correlation with patient history and  other diagnostic information is  necessary to determine patient infection status.  The expected result is negative.  Fact Sheet for Patients:   https://www.patel.info/   Fact Sheet for Healthcare Providers:   https://Hedman.com/    This test is not yet approved or cleared by the [place] FDA and  has been authorized for detection and/or diagnosis of SARS-CoV-2 by FDA under an Emergency Use Authorization (EUA).  This EUA will remain in effect (meaning this test can be used) for the duration of  the COVID-19 declaration under Section 564(b)(1)  of the Act, 21 U.S.C. section 360-bbb-3(b)(1), unless the authorization is terminated or revoked sooner.   Performed at [facility], [address], [place], [place] [address]   Culture, blood (Routine X 2) w Reflex to ID Panel     Status: None (Preliminary result)   Collection Time: 11/10/21  8:34 PM   Specimen: Left Antecubital; Blood  Result Value Ref Range Status   Specimen Description LEFT ANTECUBITAL  Final   Special Requests   Final    BOTTLES DRAWN AEROBIC AND ANAEROBIC Blood Culture results may not be optimal due to an excessive volume of blood received in culture bottles   Culture   Final    NO GROWTH < 12 HOURS Performed at [facility], [address] [place], [place] [address]    Report Status PENDING  Incomplete  Culture, blood (Routine X 2) w Reflex to ID Panel     Status: None (Preliminary result)   Collection Time: 11/10/21  8:34 PM   Specimen: BLOOD LEFT HAND  Result Value Ref Range Status   Specimen Description BLOOD LEFT HAND  Final   Special Requests   Final    BOTTLES DRAWN AEROBIC AND ANAEROBIC Blood Culture adequate volume   Culture   Final    NO GROWTH < 12 HOURS Performed at [facility], [address], [place], [place] [address]    Report Status PENDING  Incomplete  MRSA Next  Gen by PCR, Nasal     Status: None   Collection Time: 11/10/21  8:50 PM   Specimen: Nasal Mucosa; Nasal Swab  Result  Value Ref Range Status   MRSA by PCR Next Gen NOT DETECTED NOT DETECTED Final    Comment: (NOTE) The GeneXpert MRSA Assay (FDA approved for NASAL specimens only), is one component of a comprehensive MRSA colonization surveillance program. It is not intended to diagnose MRSA infection nor to guide or monitor treatment for MRSA infections. Test performance is not FDA approved in patients less than 59 years old. Performed at [facility], [address] [place], [place] [address]       Radiology Studies: CT ABDOMEN PELVIS WO CONTRAST  Result Date: 11/10/2021 CLINICAL DATA:  Abdominal pain with fever. EXAM: CT ABDOMEN AND PELVIS WITHOUT CONTRAST TECHNIQUE: Multidetector CT imaging of the abdomen and pelvis was performed following the standard protocol without IV contrast. RADIATION DOSE REDUCTION: This exam was performed according to the departmental dose-optimization program which includes automated exposure control, adjustment of the mA and/or kV according to patient size and/or use of iterative reconstruction technique. COMPARISON:  CT abdomen and pelvis 04/20/2021 FINDINGS: Lower chest: Minimal patchy ground-glass and strandy opacities are seen in the left lower lobe. Hepatobiliary: No focal liver abnormality is seen. No gallstones, gallbladder wall thickening, or biliary dilatation. Pancreas: Unremarkable. No pancreatic ductal dilatation or surrounding inflammatory changes. Spleen: Normal in size without focal abnormality. Adrenals/Urinary Tract: Again seen is a single left renal calculus measuring 8 mm. No obstructing calculi are seen in the ureters. The bladder is markedly distended. There is a small amount of air in the bladder. No hydronephrosis. Adrenal glands are within normal limits. Stomach/Bowel: Stomach is within normal limits. Appendix appears normal. No evidence of bowel wall thickening, distention, or inflammatory changes. There is a large amount of stool throughout the colon.  Vascular/Lymphatic: Aortic atherosclerosis. No enlarged abdominal or pelvic lymph nodes. Reproductive: Status post hysterectomy. No adnexal masses. Other: No abdominal wall hernia or abnormality. No abdominopelvic ascites. Musculoskeletal: No acute or significant osseous findings. IMPRESSION: 1. The bladder is markedly distended.  Please correlate clinically. 2. Small amount of air in the bladder may be iatrogenic or related to infection. 3. Nonobstructing left renal calculus. 4. Large stool burden.  No bowel obstruction. 5. Minimal strandy and ground-glass opacities in the left lower lobe, likely infectious/inflammatory. 6.  Aortic Atherosclerosis (ICD10-I70.0). Electronically Signed   By: Ronney Asters M.D.   On: 11/10/2021 17:34   DG Chest 1 View  Result Date: 11/10/2021 CLINICAL DATA:  Provided history: Altered mental status after falling last night. EXAM: CHEST  1 VIEW COMPARISON:  Prior chest radiographs 09/20/2018 and earlier. FINDINGS: Prior median sternotomy. Heart size within normal limits. No appreciable airspace consolidation or pulmonary edema. No evidence of pleural effusion or pneumothorax. No acute bony abnormality identified. IMPRESSION: No evidence of acute cardiopulmonary abnormality. Electronically Signed   By: Kellie Simmering D.O.   On: 11/10/2021 13:07   CT Head Wo Contrast  Result Date: 11/10/2021 CLINICAL DATA:  Neck pain, acute, no red flags fall, neck pain; Head trauma, abnormal mental status (Age 19-64y) fall, ams EXAM: CT HEAD WITHOUT CONTRAST CT CERVICAL SPINE WITHOUT CONTRAST TECHNIQUE: Multidetector CT imaging of the head and cervical spine was performed following the standard protocol without intravenous contrast. Multiplanar CT image reconstructions of the cervical spine were also generated. RADIATION DOSE REDUCTION: This exam was performed according to the departmental dose-optimization program which includes automated exposure control,  adjustment of the mA and/or kV according  to patient size and/or use of iterative reconstruction technique. COMPARISON:  None Available. FINDINGS: CT HEAD FINDINGS Brain: No evidence of acute infarction, hemorrhage, hydrocephalus, extra-axial collection or mass lesion/mass effect. Patchy white matter hypodensities, nonspecific but compatible with chronic microvascular ischemic disease. Vascular: No hyperdense vessel identified. Skull: No acute fracture. Sinuses/Orbits: Clear sinuses.  No acute orbital findings. Other: No mastoid effusions. CT CERVICAL SPINE FINDINGS Alignment: Straightening.  No substantial sagittal subluxation. Skull base and vertebrae: Vertebral heights are maintained. No evidence of acute fracture. Partial fusion across the C2-C3 disc space. Soft tissues and spinal canal: No prevertebral fluid or swelling. No visible canal hematoma. Disc levels: Multilevel facet and uncovertebral hypertrophy and bruise neural foraminal stenosis. Upper chest: Visualized lung apices are clear. Other: Thyroid nodules, measuring up to 1 cm. No follow-up imaging recommended (ref: J Am Coll Radiol. 2015 Feb;12(2): 143-50). IMPRESSION: 1. No evidence of acute intracranial abnormality. 2. No evidence of acute fracture or traumatic malalignment in the cervical spine. Electronically Signed   By: Margaretha Sheffield M.D.   On: 11/10/2021 13:05   CT Cervical Spine Wo Contrast  Result Date: 11/10/2021 CLINICAL DATA:  Neck pain, acute, no red flags fall, neck pain; Head trauma, abnormal mental status (Age 51-64y) fall, ams EXAM: CT HEAD WITHOUT CONTRAST CT CERVICAL SPINE WITHOUT CONTRAST TECHNIQUE: Multidetector CT imaging of the head and cervical spine was performed following the standard protocol without intravenous contrast. Multiplanar CT image reconstructions of the cervical spine were also generated. RADIATION DOSE REDUCTION: This exam was performed according to the departmental dose-optimization program which includes automated exposure control, adjustment of  the mA and/or kV according to patient size and/or use of iterative reconstruction technique. COMPARISON:  None Available. FINDINGS: CT HEAD FINDINGS Brain: No evidence of acute infarction, hemorrhage, hydrocephalus, extra-axial collection or mass lesion/mass effect. Patchy white matter hypodensities, nonspecific but compatible with chronic microvascular ischemic disease. Vascular: No hyperdense vessel identified. Skull: No acute fracture. Sinuses/Orbits: Clear sinuses.  No acute orbital findings. Other: No mastoid effusions. CT CERVICAL SPINE FINDINGS Alignment: Straightening.  No substantial sagittal subluxation. Skull base and vertebrae: Vertebral heights are maintained. No evidence of acute fracture. Partial fusion across the C2-C3 disc space. Soft tissues and spinal canal: No prevertebral fluid or swelling. No visible canal hematoma. Disc levels: Multilevel facet and uncovertebral hypertrophy and bruise neural foraminal stenosis. Upper chest: Visualized lung apices are clear. Other: Thyroid nodules, measuring up to 1 cm. No follow-up imaging recommended (ref: J Am Coll Radiol. 2015 Feb;12(2): 143-50). IMPRESSION: 1. No evidence of acute intracranial abnormality. 2. No evidence of acute fracture or traumatic malalignment in the cervical spine. Electronically Signed   By: Margaretha Sheffield M.D.   On: 11/10/2021 13:05     Scheduled Meds:  amLODipine  10 mg Oral Daily   vitamin C  500 mg Oral Daily   aspirin  81 mg Oral Daily   Chlorhexidine Gluconate Cloth  6 each Topical Daily   feeding supplement  237 mL Oral BID BM   gabapentin  600 mg Oral BID   heparin  5,000 Units Subcutaneous Q8H   hydrocortisone  25 mg Rectal BID   insulin aspart  0-15 Units Subcutaneous TID WC   insulin aspart  0-5 Units Subcutaneous QHS   levothyroxine  25 mcg Oral Q0600   metoprolol tartrate  100 mg Oral BID   multivitamin with minerals  1 tablet Oral Daily   nirmatrelvir/ritonavir EUA (renal dosing)  2  tablet Oral BID    zinc sulfate  220 mg Oral Daily   Continuous Infusions:  sodium chloride 75 mL/hr at 11/11/21 1231   cefTRIAXone (ROCEPHIN)  IV 1 g (11/11/21 1613)   nitroGLYCERIN      LOS: 1 day   Roxan Hockey M.D on 11/11/2021 at 4:31 PM  Go to [email or web address] - for contact info  [facility] - Office  [phone]  If 7PM-7AM, please contact night-coverage [email or web address] 11/11/2021, 4:31 PM

## 2021-11-11 NOTE — TOC Progression Note (Signed)
  Transition of Care (TOC) Screening Note   Patient Details  Name: Anita [name] Date of Birth: 1960-07-30   Transition of Care [facility]) CM/SW Contact:    Boneta Lucks, RN Phone Number: 11/11/2021, 11:01 AM    Transition of Care Department [facility]) has reviewed patient and no TOC needs have been identified at this time. We will continue to monitor patient advancement through interdisciplinary progression rounds. If new patient transition needs arise, please place a TOC consult.      Barriers to Discharge: Continued Medical Work up

## 2021-11-11 NOTE — Inpatient Diabetes Management (Signed)
Inpatient Diabetes Program Recommendations  AACE/ADA: New Consensus Statement on Inpatient Glycemic Control   Target Ranges:  Prepandial:   less than 140 mg/dL      Peak postprandial:   less than 180 mg/dL (1-2 hours)      Critically ill patients:  140 - 180 mg/dL    Latest Reference Range & Units 11/10/21 11:58 11/10/21 12:17 11/10/21 19:35 11/10/21 20:18 11/10/21 21:20 11/11/21 07:30  Glucose-Capillary 70 - 99 mg/dL 38 (LL) 208 (H) 66 (L) 92 141 (H) 96    Latest Reference Range & Units 11/11/21 03:45  Hemoglobin A1C 4.8 - 5.6 % 5.2   Review of Glycemic Control  Diabetes history: DM2 Outpatient Diabetes medications: Glipizide 5 mg BID, Metformin 500 mg BID Current orders for Inpatient glycemic control: Novolog 0-15 units TID with meals, Novolog 0-5 units QHS  Inpatient Diabetes Program Recommendations:    HbgA1C: A1C 5.2% on 11/11/21 indicating an average glucose of 103 mg/dl over the past 2-3 months.   Outpatient DM medications: Initial glucose 38 mg/dl on 11/10/21 at 11:58 and down to 66 mg/dl at 19:35 on 11/10/21. Given noted hypoglycemia and A1C of 5.2%, question if patient is experiencing hypoglycemia frequently outpatient. Would recommend to decrease outpatient DM medications at discharge.  Thanks, Barnie Alderman, RN, MSN, [place] Diabetes Coordinator Inpatient Diabetes Program [phone] (Team Pager from 8am to [place])

## 2021-11-11 NOTE — Plan of Care (Signed)
  Problem: Coping: Goal: Psychosocial and spiritual needs will be supported Outcome: Progressing   Problem: Respiratory: Goal: Will maintain a patent airway Outcome: Progressing Goal: Complications related to the disease process, condition or treatment will be avoided or minimized Outcome: Progressing   Problem: Respiratory: Goal: Will maintain a patent airway Outcome: Progressing

## 2021-11-11 NOTE — Progress Notes (Signed)
Initial Nutrition Assessment  DOCUMENTATION CODES:   Non-severe (moderate) malnutrition in context of acute illness/injury  INTERVENTION:  Discontinue Ensure Enlive  Add Magic cup with each tray   Offer snack from nursing nourishment room between meals  NUTRITION DIAGNOSIS:   Moderate Malnutrition related to acute illness (COVID postive) as evidenced by per patient/family report, energy intake < 75% for > 7 days, percent weight loss (9% < 3 months), mild muscle and fat depletions.   GOAL:  Patient will meet greater than or equal to 90% of their needs   MONITOR:  PO intake, Supplement acceptance, Labs, Weight trends  REASON FOR ASSESSMENT:   Malnutrition Screening Tool    ASSESSMENT: Patient is a 61 yo female with history of DM2, CAD, Stroke, HTN, constipation, anemia, abdominal pain, chronic nausea and UTI who presents with altered mental status. COVID positive.   Patient is awake and husband is bedside. Patient has not been eating the past 3 weeks. She drinks primarily diet Mtn Dew. She doesn't like Ensure or Juice but will eat ice cream. Talked with her about options soft foods, encouraged protein every meal.   Emphasized the importance of finding a source of nutrition for her. She had rather not have an NGT placed unless absolutely necessary.    Weight loss 9% (5.3 kg) < 3 months which is significant. Patient is weak and had multiple falls at home PTA per husband.   Medications: insulin, MVI, Zinc, Vitamin C and Paxlovid.       Latest Ref Rng & Units 11/11/2021    3:45 AM 11/10/2021   12:46 PM 07/13/2021    3:28 PM  BMP  Glucose 70 - 99 mg/dL 100  166  59   BUN 6 - 20 mg/dL 34  46  31   Creatinine 0.44 - 1.00 mg/dL 1.39  2.03  1.78   Sodium 135 - 145 mmol/L 137  133  139   Potassium 3.5 - 5.1 mmol/L 3.9  3.8  4.4   Chloride 98 - 111 mmol/L 102  95  102   CO2 22 - 32 mmol/L '26  26  27   '$ Calcium 8.9 - 10.3 mg/dL 8.3  8.7  8.9      NUTRITION - FOCUSED PHYSICAL  EXAM:  Flowsheet Row Most Recent Value  Orbital Region Mild depletion  Upper Arm Region Moderate depletion  Thoracic and Lumbar Region Moderate depletion  Temple Region Mild depletion  Clavicle Bone Region Mild depletion  Clavicle and Acromion Bone Region Moderate depletion  Scapular Bone Region Mild depletion  Dorsal Hand Moderate depletion  Patellar Region Severe depletion  Anterior Thigh Region Moderate depletion  Posterior Calf Region Mild depletion  Edema (RD Assessment) None  Hair Reviewed  Eyes Reviewed  Mouth Reviewed  [missing teeth]  Skin Reviewed  Nails Reviewed       Diet Order:   Diet Order             Diet Carb Modified Fluid consistency: Thin; Room service appropriate? Yes  Diet effective now                   EDUCATION NEEDS:  Education needs have been addressed  Skin:  Skin Assessment: Reviewed RN Assessment  Last BM:  6/13  Height:   Ht Readings from Last 1 Encounters:  11/10/21 '5\' 5"'$  (1.651 m)    Weight:   Wt Readings from Last 1 Encounters:  11/10/21 55.5 kg    Ideal Body Weight:  57 kg  BMI:  Body mass index is 20.36 kg/m.  Estimated Nutritional Needs:   Kcal:  4619-0122  Protein:  90-95 gr  Fluid:  >1600 ml daily  Colman Cater MS,RD,CSG,LDN Contact: Shea Evans

## 2021-11-12 DIAGNOSIS — G9341 Metabolic encephalopathy: Secondary | ICD-10-CM | POA: Diagnosis not present

## 2021-11-12 LAB — COMPREHENSIVE METABOLIC PANEL
ALT: 28 U/L (ref 0–44)
AST: 34 U/L (ref 15–41)
Albumin: 3.6 g/dL (ref 3.5–5.0)
Alkaline Phosphatase: 35 U/L — ABNORMAL LOW (ref 38–126)
Anion gap: 14 (ref 5–15)
BUN: 24 mg/dL — ABNORMAL HIGH (ref 6–20)
CO2: 22 mmol/L (ref 22–32)
Calcium: 8.5 mg/dL — ABNORMAL LOW (ref 8.9–10.3)
Chloride: 101 mmol/L (ref 98–111)
Creatinine, Ser: 1.15 mg/dL — ABNORMAL HIGH (ref 0.44–1.00)
GFR, Estimated: 55 mL/min — ABNORMAL LOW (ref >60)
Glucose, Bld: 141 mg/dL — ABNORMAL HIGH (ref 70–99)
Potassium: 3.3 mmol/L — ABNORMAL LOW (ref 3.5–5.1)
Sodium: 137 mmol/L (ref 135–145)
Total Bilirubin: 0.9 mg/dL (ref 0.3–1.2)
Total Protein: 7.1 g/dL (ref 6.5–8.1)

## 2021-11-12 LAB — C-REACTIVE PROTEIN: CRP: 0.9 mg/dL (ref <1.0)

## 2021-11-12 LAB — GLUCOSE, CAPILLARY
Glucose-Capillary: 170 mg/dL — ABNORMAL HIGH (ref 70–99)
Glucose-Capillary: 307 mg/dL — ABNORMAL HIGH (ref 70–99)
Glucose-Capillary: 153 mg/dL — ABNORMAL HIGH (ref 70–99)
Glucose-Capillary: 172 mg/dL — ABNORMAL HIGH (ref 70–99)

## 2021-11-12 LAB — URINE CULTURE: Culture: 70000 — AB

## 2021-11-12 LAB — CBC WITH DIFFERENTIAL/PLATELET
Abs Immature Granulocytes: 0.01 10*3/uL (ref 0.00–0.07)
Basophils Absolute: 0 10*3/uL (ref 0.0–0.1)
Basophils Relative: 0 %
Eosinophils Absolute: 0 10*3/uL (ref 0.0–0.5)
Eosinophils Relative: 0 %
HCT: 37.8 % (ref 36.0–46.0)
Hemoglobin: 12.2 g/dL (ref 12.0–15.0)
Immature Granulocytes: 0 %
Lymphocytes Relative: 22 %
Lymphs Abs: 0.5 10*3/uL — ABNORMAL LOW (ref 0.7–4.0)
MCH: 27.4 pg (ref 26.0–34.0)
MCHC: 32.3 g/dL (ref 30.0–36.0)
MCV: 84.8 fL (ref 80.0–100.0)
Monocytes Absolute: 0.5 10*3/uL (ref 0.1–1.0)
Monocytes Relative: 23 %
Neutro Abs: 1.3 10*3/uL — ABNORMAL LOW (ref 1.7–7.7)
Neutrophils Relative %: 55 %
Platelets: 211 10*3/uL (ref 150–400)
RBC: 4.46 MIL/uL (ref 3.87–5.11)
RDW: 13.4 % (ref 11.5–15.5)
WBC: 2.3 10*3/uL — ABNORMAL LOW (ref 4.0–10.5)
nRBC: 0 % (ref 0.0–0.2)

## 2021-11-12 LAB — FERRITIN: Ferritin: 286 ng/mL (ref 11–307)

## 2021-11-12 LAB — PHOSPHORUS: Phosphorus: 2.9 mg/dL (ref 2.5–4.6)

## 2021-11-12 LAB — D-DIMER, QUANTITATIVE: D-Dimer, Quant: 0.27 ug/mL-FEU (ref 0.00–0.50)

## 2021-11-12 LAB — MAGNESIUM: Magnesium: 2 mg/dL (ref 1.7–2.4)

## 2021-11-12 MED ORDER — ISOSORBIDE MONONITRATE 20 MG PO TABS
10.0000 mg | ORAL_TABLET | Freq: Two times a day (BID) | ORAL | Status: DC
  Administered 2021-11-12 – 2021-11-13 (×2): 10 mg via ORAL

## 2021-11-12 MED ORDER — HYDRALAZINE HCL 10 MG PO TABS
10.0000 mg | ORAL_TABLET | Freq: Three times a day (TID) | ORAL | Status: DC
  Administered 2021-11-12 – 2021-11-13 (×2): 10 mg via ORAL
  Filled 2021-11-12 (×2): qty 1

## 2021-11-12 MED ORDER — POTASSIUM CHLORIDE CRYS ER 20 MEQ PO TBCR
40.0000 meq | EXTENDED_RELEASE_TABLET | ORAL | Status: AC
  Administered 2021-11-12 (×2): 40 meq via ORAL
  Filled 2021-11-12 (×2): qty 2

## 2021-11-12 MED ORDER — LACTULOSE 10 GM/15ML PO SOLN
30.0000 g | Freq: Two times a day (BID) | ORAL | Status: DC
  Administered 2021-11-12 – 2021-11-13 (×2): 30 g via ORAL
  Filled 2021-11-12 (×3): qty 60

## 2021-11-12 MED ORDER — BISACODYL 10 MG RE SUPP
10.0000 mg | Freq: Once | RECTAL | Status: AC
  Administered 2021-11-12: 10 mg via RECTAL
  Filled 2021-11-12: qty 1

## 2021-11-12 MED ORDER — CEPHALEXIN 250 MG PO CAPS
500.0000 mg | ORAL_CAPSULE | Freq: Two times a day (BID) | ORAL | Status: DC
  Administered 2021-11-13: 500 mg via ORAL
  Filled 2021-11-12: qty 2

## 2021-11-12 NOTE — Progress Notes (Signed)
PROGRESS NOTE     Anita [name], is a 61 y.o. female, DOB - 1961-05-22, FXT:[id number]  Admit date - 11/10/2021   Admitting Physician Rolla Plate, DO  Outpatient Primary MD for the patient is Redmond School, MD  LOS - 2  Chief Complaint  Patient presents with   Altered Mental Status        Brief Narrative:  61 y.o. female with medical history significant for DM, anxiety, atrial septal defect, CAD, HTN, HLD, peripheral neuropathy, h/o prior stroke admitted with acute metabolic encephalopathy in the setting of severely elevated blood pressures.  Blood pressure over 230 mmhg and COVID-19 positive status    -Assessment and Plan: * Acute metabolic encephalopathy - Multifactorial suspect some component of hypertensive emergency with systolic blood pressure over 230 mmHg -CT head negative for acute finding -No acute focal neurodeficits at this time Mentation improving with better BP control -Cannot rule out contributory effect of COVID-19 infection with fevers and possible UTI  Hypertensive crisis-- -Systolic BP was over 299 mmHg -BP remains elevated despite p.o. amlodipine and p.o. metoprolol, as well as IV hydralazine  -Given concerns about mentation in a patient with history of prior stroke -Patient is fully weaned off IV nitro drip on 11/12/2021 -Continue amlodipine and metoprolol, will add hydralazine/isosorbide combo  Diabetes mellitus with neuropathy (HCC) -Hold oral hypoglycemics A1c is 5.3 reflecting excellent diabetic control PTA -continue gabapentin Use Novolog/Humalog Sliding scale insulin with Accu-Cheks/Fingersticks as ordered   E. coli UTI--- treated with IV Rocephin okay to de-escalate to Keflex in a.m.  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  - creatinine on admission= 2.0, - baseline creatinine = 1.3 to 1.5   ,  -Renal function is back to baseline --, renally adjust medications, avoid nephrotoxic agents / dehydration  / hypotension  Hypokalemia--replace and recheck  Disposition/Need for in-Hospital Stay- patient unable to be discharged at this time due to -acute metabolic encephalopathy in setting of severe elevated BP requiring IV nitro drip for BP control,   Status is: Inpatient   Disposition: The patient is from: Home              Anticipated d/c is to: Home              Anticipated d/c date is: 1 day              Patient currently is not medically stable to d/c. Barriers: Not Clinically Stable-   Code Status :  -  Code Status: Full Code   Family Communication: Husband is primary contact  DVT Prophylaxis  :   - SCDs *  heparin injection 5,000 Units Start: 11/10/21 2200 SCDs Start: 11/10/21 2114   Lab Results  Component Value Date   PLT 211 11/12/2021    Inpatient Medications  Scheduled Meds:  amLODipine  10 mg Oral Daily   vitamin C  500 mg Oral Daily   aspirin  81 mg Oral Daily   [START ON 11/13/2021] cephALEXin  500 mg Oral Q12H   Chlorhexidine Gluconate Cloth  6 each Topical Daily   feeding supplement  237 mL Oral BID BM   gabapentin  600 mg Oral BID   heparin  5,000 Units Subcutaneous Q8H   hydrocortisone  25 mg Rectal BID   insulin aspart  0-15 Units Subcutaneous TID  WC   insulin aspart  0-5 Units Subcutaneous QHS   lactulose  30 g Oral BID   levothyroxine  25 mcg Oral Q0600   metoprolol tartrate  100 mg Oral BID   multivitamin with minerals  1 tablet Oral Daily   nirmatrelvir/ritonavir EUA (renal dosing)  2 tablet Oral BID   zinc sulfate  220 mg Oral Daily   Continuous Infusions:  sodium chloride 75 mL/hr at 11/12/21 1728   nitroGLYCERIN Stopped (11/12/21 1338)   PRN Meds:.acetaminophen **OR** acetaminophen, ALPRAZolam, hydrALAZINE, ondansetron **OR** ondansetron (ZOFRAN) IV,  oxyCODONE, polyethylene glycol   Anti-infectives (From admission, onward)    Start     Dose/Rate Route Frequency Ordered Stop   11/13/21 1000  cephALEXin (KEFLEX) capsule 500 mg        500 mg Oral Every 12 hours 11/12/21 0952 11/16/21 0959   11/11/21 1700  cefTRIAXone (ROCEPHIN) 2 g in sodium chloride 0.9 % 100 mL IVPB  Status:  Discontinued        2 g 200 mL/hr over 30 Minutes Intravenous Every 24 hours 11/10/21 2113 11/11/21 0950   11/11/21 1700  cefTRIAXone (ROCEPHIN) 1 g in sodium chloride 0.9 % 100 mL IVPB        1 g 200 mL/hr over 30 Minutes Intravenous Every 24 hours 11/11/21 0950 11/12/21 1720   11/11/21 1230  nirmatrelvir/ritonavir EUA (renal dosing) (PAXLOVID) 2 tablet        2 tablet Oral 2 times daily 11/11/21 1141 11/16/21 0959   11/11/21 1000  nirmatrelvir/ritonavir EUA (PAXLOVID) 2 tablet  Status:  Discontinued        2 tablet Oral 2 times daily 11/11/21 0741 11/11/21 1141   11/10/21 1745  cefTRIAXone (ROCEPHIN) 1 g in sodium chloride 0.9 % 100 mL IVPB        1 g 200 mL/hr over 30 Minutes Intravenous  Once 11/10/21 1734 11/10/21 1907   11/10/21 1745  doxycycline (VIBRA-TABS) tablet 100 mg        100 mg Oral  Once 11/10/21 1740 11/10/21 1830         Subjective: Dorianne Tayler today has  no emesis,  No chest pain,   -Patient had significant confusion and disorientation overnight -More coherent today -Having BMs -BP improving able to come off nitro drip -Husband visited   Objective: Vitals:   11/12/21 1500 11/12/21 1530 11/12/21 1600 11/12/21 1642  BP: (!) 130/59 (!) 129/55 (!) 128/58   Pulse: 64 (!) 59 60   Resp: '17 19 19   '$ Temp:    97.7 F (36.5 C)  TempSrc:    Axillary  SpO2: (!) 89% 95% 96%   Weight:      Height:        Intake/Output Summary (Last 24 hours) at 11/12/2021 1832 Last data filed at 11/12/2021 1753 Gross per 24 hour  Intake 956.13 ml  Output 725 ml  Net 231.13 ml   Filed Weights   11/10/21 1149 11/10/21 2105 11/12/21 0600  Weight: 59  kg 55.5 kg 51.6 kg    Physical Exam  Gen:- Awake Alert, in no acute distress HEENT:- [place].AT, No sclera icterus Neck-Supple Neck,No JVD,.  Lungs-  CTAB , fair symmetrical air movement CV- S1, S2 normal, regular  Abd-  +ve B.Sounds, Abd Soft, No tenderness,    Extremity/Skin:- No  edema, pedal pulses present  NeuroPsych---No headaches or visual disturbance or new/additional focal weakness -History of prior stroke with subtle left-sided hemiparesis  Data Reviewed: I have personally reviewed following  labs and imaging studies  CBC: Recent Labs  Lab 11/10/21 1246 11/11/21 0345 11/12/21 0350  WBC 3.2* 2.7* 2.3*  NEUTROABS 2.5 1.8 1.3*  HGB 13.4 12.0 12.2  HCT 41.1 39.3 37.8  MCV 85.4 89.3 84.8  PLT 223 200 413   Basic Metabolic Panel: Recent Labs  Lab 11/10/21 1246 11/11/21 0345 11/12/21 0350  NA 133* 137 137  K 3.8 3.9 3.3*  CL 95* 102 101  CO2 '26 26 22  '$ GLUCOSE 166* 100* 141*  BUN 46* 34* 24*  CREATININE 2.03* 1.39* 1.15*  CALCIUM 8.7* 8.3* 8.5*  MG  --  1.5* 2.0  PHOS  --   --  2.9   GFR: Estimated Creatinine Clearance: 42.4 mL/min (A) (by C-G formula based on SCr of 1.15 mg/dL (H)). Liver Function Tests: Recent Labs  Lab 11/10/21 1246 11/11/21 0345 11/12/21 0350  AST 54* 46* 34  ALT 38 33 28  ALKPHOS 39 32* 35*  BILITOT 0.7 0.4 0.9  PROT 8.0 6.9 7.1  ALBUMIN 4.2 3.5 3.6   Cardiac Enzymes: No results for input(s): "CKTOTAL", "CKMB", "CKMBINDEX", "TROPONINI" in the last 168 hours. BNP (last 3 results) No results for input(s): "PROBNP" in the last 8760 hours. HbA1C: Recent Labs    11/11/21 0345  HGBA1C 5.2   Sepsis Labs: '@LABRCNTIP'$ (procalcitonin:4,lacticidven:4) ) Recent Results (from the past 240 hour(s))  Urine Culture     Status: Abnormal   Collection Time: 11/10/21  4:01 PM   Specimen: Urine, Clean Catch  Result Value Ref Range Status   Specimen Description   Final    URINE, CLEAN CATCH Performed at [facility], [address],  [place], [place] [address]    Special Requests   Final    NONE Performed at [facility], [address], [place],  [address]    Culture 70,000 COLONIES/mL ESCHERICHIA COLI (A)  Final   Report Status 11/12/2021 FINAL  Final   Organism ID, Bacteria ESCHERICHIA COLI (A)  Final      Susceptibility   Escherichia coli - MIC*    AMPICILLIN >=32 RESISTANT Resistant     CEFAZOLIN <=4 SENSITIVE Sensitive     CEFEPIME <=0.12 SENSITIVE Sensitive     CEFTRIAXONE <=0.25 SENSITIVE Sensitive     CIPROFLOXACIN >=4 RESISTANT Resistant     GENTAMICIN <=1 SENSITIVE Sensitive     IMIPENEM <=0.25 SENSITIVE Sensitive     NITROFURANTOIN <=16 SENSITIVE Sensitive     TRIMETH/SULFA 80 RESISTANT Resistant     AMPICILLIN/SULBACTAM >=32 RESISTANT Resistant     PIP/TAZO <=4 SENSITIVE Sensitive     * 70,000 COLONIES/mL ESCHERICHIA COLI  SARS Coronavirus 2 by RT PCR (hospital order, performed in [place] hospital lab) *cepheid single result test* Anterior Nasal Swab     Status: Abnormal   Collection Time: 11/10/21  5:15 PM   Specimen: Anterior Nasal Swab  Result Value Ref Range Status   SARS Coronavirus 2 by RT PCR POSITIVE (A) NEGATIVE Final    Comment: (NOTE) SARS-CoV-2 target nucleic acids are DETECTED  SARS-CoV-2 RNA is generally detectable in upper respiratory specimens  during the acute phase of infection.  Positive results are indicative  of the presence of the identified virus, but do not rule out bacterial infection or co-infection with other pathogens not detected by the test.  Clinical correlation with patient history and  other diagnostic information is necessary to determine patient infection status.  The expected result is negative.  Fact Sheet for Patients:   https://www.patel.info/  Fact Sheet for Healthcare Providers:   https://[place].com/    This test is not yet approved or cleared by the [place] FDA and  has been authorized for  detection and/or diagnosis of SARS-CoV-2 by FDA under an Emergency Use Authorization (EUA).  This EUA will remain in effect (meaning this test can be used) for the duration of  the COVID-19 declaration under Section 564(b)(1)  of the Act, 21 U.S.C. section 360-bbb-3(b)(1), unless the authorization is terminated or revoked sooner.   Performed at [facility], [address], [place], [place] [address]   Culture, blood (Routine X 2) w Reflex to ID Panel     Status: None (Preliminary result)   Collection Time: 11/10/21  8:34 PM   Specimen: Left Antecubital; Blood  Result Value Ref Range Status   Specimen Description LEFT ANTECUBITAL  Final   Special Requests   Final    BOTTLES DRAWN AEROBIC AND ANAEROBIC Blood Culture results may not be optimal due to an excessive volume of blood received in culture bottles   Culture   Final    NO GROWTH 2 DAYS Performed at [facility], [address], [place], [place] [address]    Report Status PENDING  Incomplete  Culture, blood (Routine X 2) w Reflex to ID Panel     Status: None (Preliminary result)   Collection Time: 11/10/21  8:34 PM   Specimen: BLOOD LEFT HAND  Result Value Ref Range Status   Specimen Description BLOOD LEFT HAND  Final   Special Requests   Final    BOTTLES DRAWN AEROBIC AND ANAEROBIC Blood Culture adequate volume   Culture   Final    NO GROWTH 2 DAYS Performed at [facility], [address], [place], [place] [address]    Report Status PENDING  Incomplete  MRSA Next Gen by PCR, Nasal     Status: None   Collection Time: 11/10/21  8:50 PM   Specimen: Nasal Mucosa; Nasal Swab  Result Value Ref Range Status   MRSA by PCR Next Gen NOT DETECTED NOT DETECTED Final    Comment: (NOTE) The GeneXpert MRSA Assay (FDA approved for NASAL specimens only), is one component of a comprehensive MRSA colonization surveillance program. It is not intended to diagnose MRSA infection nor to guide or monitor treatment for MRSA infections. Test  performance is not FDA approved in patients less than 41 years old. Performed at [facility], [address] [place], [place] [address]       Radiology Studies: No results found.   Scheduled Meds:  amLODipine  10 mg Oral Daily   vitamin C  500 mg Oral Daily   aspirin  81 mg Oral Daily   [START ON 11/13/2021] cephALEXin  500 mg Oral Q12H   Chlorhexidine Gluconate Cloth  6 each Topical Daily   feeding supplement  237 mL Oral BID BM   gabapentin  600 mg Oral BID   heparin  5,000 Units Subcutaneous Q8H   hydrocortisone  25 mg Rectal BID   insulin aspart  0-15 Units Subcutaneous TID WC   insulin aspart  0-5 Units Subcutaneous QHS   lactulose  30 g Oral BID   levothyroxine  25 mcg Oral Q0600   metoprolol tartrate  100 mg Oral BID   multivitamin with minerals  1 tablet Oral Daily   nirmatrelvir/ritonavir EUA (renal dosing)  2 tablet Oral BID   zinc sulfate  220 mg Oral Daily   Continuous Infusions:  sodium chloride 75 mL/hr at  11/12/21 1728   nitroGLYCERIN Stopped (11/12/21 1338)    LOS: 2 days   Roxan Hockey M.D on 11/12/2021 at 6:32 PM  Go to [email or web address] - for contact info  [facility] - Office  [phone]  If 7PM-7AM, please contact night-coverage [email or web address] 11/12/2021, 6:32 PM

## 2021-11-12 NOTE — Inpatient Diabetes Management (Signed)
Inpatient Diabetes Program Recommendations  AACE/ADA: New Consensus Statement on Inpatient Glycemic Control (2015)  Target Ranges:  Prepandial:   less than 140 mg/dL      Peak postprandial:   less than 180 mg/dL (1-2 hours)      Critically ill patients:  140 - 180 mg/dL    Latest Reference Range & Units 11/12/21 07:45 11/12/21 11:32  Glucose-Capillary 70 - 99 mg/dL 170 (H) 307 (H)    Latest Reference Range & Units 11/11/21 07:30 11/11/21 11:42 11/11/21 16:07 11/11/21 21:01  Glucose-Capillary 70 - 99 mg/dL 96 157 (H) 103 (H) 103 (H)    Latest Reference Range & Units 11/10/21 11:58  Glucose-Capillary 70 - 99 mg/dL 38 (LL)   Review of Glycemic Control  Diabetes history: DM2 Outpatient Diabetes medications: Glipizide 5 mg BID, Metformin 500 mg BID Current orders for Inpatient glycemic control: Novolog 0-15 units TID with meals, Novolog 0-5 units QHS   Inpatient Diabetes Program Recommendations:     Insulin: Please consider ordering Novolog 4 units TID with meals for meal coverage if patient eats at least 50% of meals.  HbgA1C: A1C 5.2% on 11/11/21 indicating an average glucose of 103 mg/dl over the past 2-3 months.    Outpatient DM medications: Initial glucose 38 mg/dl on 11/10/21 at 11:58 and down to 66 mg/dl at 19:35 on 11/10/21. Given noted hypoglycemia and A1C of 5.2%, question if patient is experiencing hypoglycemia frequently outpatient. May want to consider decreasing outpatient DM medications at discharge.   NOTE: Spoke with patient over the phone regarding DM. Patient states that she is taking Glipizide 5 mg BID and Metformin 500 mg BID as an outpatient. She states that glucose is usually very good but it has been "different" over the past couple days. Asked for clarification of "very good" and patient states she can not remember exact glucose ranges over past week. Inquired about hypoglycemia and patient reports she is not sure how often her glucose is low but she reports she feels  symptoms of hypoglycemia when her glucose gets too low. Discussed initial glucose of 45 mg/dl and A1C of 5.2% indicating an average glucose of 102 mg/dl. Explained that given noted hypoglycemia and low A1C, question if she is having frequent hypoglycemia. Patient states she can't remember how often she has glucose less than 70 mg/dl. Discussed hypoglycemia and encouraged patient to check glucose 3-4 times per day and if she is having glucose values less than 70 mg/dl, she needs to reach out to her PCP as DM medications may need to be decreased. Patient verbalized understanding of information and has no questions at this time.  Thanks, Barnie Alderman, RN, MSN, [place] Diabetes Coordinator Inpatient Diabetes Program [phone] (Team Pager from 8am to [place])

## 2021-11-13 LAB — CBC WITH DIFFERENTIAL/PLATELET
Abs Immature Granulocytes: 0.01 10*3/uL (ref 0.00–0.07)
Basophils Absolute: 0 10*3/uL (ref 0.0–0.1)
Basophils Relative: 1 %
Eosinophils Absolute: 0 10*3/uL (ref 0.0–0.5)
Eosinophils Relative: 1 %
HCT: 35.1 % — ABNORMAL LOW (ref 36.0–46.0)
Hemoglobin: 11.3 g/dL — ABNORMAL LOW (ref 12.0–15.0)
Immature Granulocytes: 1 %
Lymphocytes Relative: 38 %
Lymphs Abs: 0.7 10*3/uL (ref 0.7–4.0)
MCH: 27.5 pg (ref 26.0–34.0)
MCHC: 32.2 g/dL (ref 30.0–36.0)
MCV: 85.4 fL (ref 80.0–100.0)
Monocytes Absolute: 0.4 10*3/uL (ref 0.1–1.0)
Monocytes Relative: 20 %
Neutro Abs: 0.7 10*3/uL — ABNORMAL LOW (ref 1.7–7.7)
Neutrophils Relative %: 39 %
Platelets: 187 10*3/uL (ref 150–400)
RBC: 4.11 MIL/uL (ref 3.87–5.11)
RDW: 13.6 % (ref 11.5–15.5)
WBC: 1.8 10*3/uL — ABNORMAL LOW (ref 4.0–10.5)
nRBC: 0 % (ref 0.0–0.2)

## 2021-11-13 LAB — COMPREHENSIVE METABOLIC PANEL
ALT: 22 U/L (ref 0–44)
AST: 27 U/L (ref 15–41)
Albumin: 3.3 g/dL — ABNORMAL LOW (ref 3.5–5.0)
Alkaline Phosphatase: 39 U/L (ref 38–126)
Anion gap: 7 (ref 5–15)
BUN: 25 mg/dL — ABNORMAL HIGH (ref 6–20)
CO2: 23 mmol/L (ref 22–32)
Calcium: 8.4 mg/dL — ABNORMAL LOW (ref 8.9–10.3)
Chloride: 109 mmol/L (ref 98–111)
Creatinine, Ser: 1.08 mg/dL — ABNORMAL HIGH (ref 0.44–1.00)
GFR, Estimated: 59 mL/min — ABNORMAL LOW (ref >60)
Glucose, Bld: 140 mg/dL — ABNORMAL HIGH (ref 70–99)
Potassium: 4.4 mmol/L (ref 3.5–5.1)
Sodium: 139 mmol/L (ref 135–145)
Total Bilirubin: 0.4 mg/dL (ref 0.3–1.2)
Total Protein: 6.3 g/dL — ABNORMAL LOW (ref 6.5–8.1)

## 2021-11-13 LAB — GLUCOSE, CAPILLARY: Glucose-Capillary: 133 mg/dL — ABNORMAL HIGH (ref 70–99)

## 2021-11-13 MED ORDER — TIZANIDINE HCL 4 MG PO TABS: 4.0000 mg | ORAL_TABLET | Freq: Every day | ORAL | 0 refills | Status: DC

## 2021-11-13 MED ORDER — CEPHALEXIN 500 MG PO CAPS: 500.0000 mg | ORAL_CAPSULE | Freq: Two times a day (BID) | ORAL | 0 refills | Status: AC

## 2021-11-13 MED ORDER — ACETAMINOPHEN 325 MG PO TABS: 650.0000 mg | ORAL_TABLET | Freq: Four times a day (QID) | ORAL | 0 refills | Status: DC | PRN

## 2021-11-13 MED ORDER — ISOSORBIDE MONONITRATE ER 30 MG PO TB24: 30.0000 mg | ORAL_TABLET | Freq: Every day | ORAL | 5 refills | Status: DC

## 2021-11-13 MED ORDER — AMLODIPINE BESYLATE 10 MG PO TABS
10.0000 mg | ORAL_TABLET | Freq: Every day | ORAL | 3 refills | Status: DC
Start: 2021-11-14 — End: 2023-01-11

## 2021-11-13 MED ORDER — ASPIRIN 81 MG PO TBEC: 81.0000 mg | DELAYED_RELEASE_TABLET | Freq: Every day | ORAL | 2 refills | Status: AC

## 2021-11-13 MED ORDER — ISOSORBIDE MONONITRATE 20 MG PO TABS
20.0000 mg | ORAL_TABLET | Freq: Two times a day (BID) | ORAL | Status: DC
  Administered 2021-11-13: 20 mg via ORAL
  Filled 2021-11-13: qty 1

## 2021-11-13 MED ORDER — ZINC SULFATE 220 (50 ZN) MG PO CAPS
220.0000 mg | ORAL_CAPSULE | Freq: Every day | ORAL | 3 refills | Status: DC
Start: 2021-11-14 — End: 2023-02-13

## 2021-11-13 MED ORDER — ASCORBIC ACID 500 MG PO TABS: 500.0000 mg | ORAL_TABLET | Freq: Every day | ORAL | 2 refills | Status: DC

## 2021-11-13 MED ORDER — NIRMATRELVIR/RITONAVIR (PAXLOVID) TABLET (RENAL DOSING): 2.0000 | ORAL_TABLET | Freq: Two times a day (BID) | ORAL | 0 refills | Status: AC

## 2021-11-13 MED ORDER — HYDRALAZINE HCL 25 MG PO TABS
25.0000 mg | ORAL_TABLET | Freq: Three times a day (TID) | ORAL | Status: DC
  Administered 2021-11-13: 25 mg via ORAL
  Filled 2021-11-13: qty 1

## 2021-11-13 MED ORDER — HYDRALAZINE HCL 50 MG PO TABS: 50.0000 mg | ORAL_TABLET | Freq: Two times a day (BID) | ORAL | 5 refills | Status: DC

## 2021-11-13 NOTE — Inpatient Diabetes Management (Signed)
Inpatient Diabetes Program Recommendations  AACE/ADA: New Consensus Statement on Inpatient Glycemic Control   Target Ranges:  Prepandial:   less than 140 mg/dL      Peak postprandial:   less than 180 mg/dL (1-2 hours)      Critically ill patients:  140 - 180 mg/dL    Latest Reference Range & Units 11/12/21 07:45 11/12/21 11:32 11/12/21 16:44 11/12/21 20:57 11/13/21 07:17  Glucose-Capillary 70 - 99 mg/dL 170 (H)  Novolog 3 units 307 (H)  Novolog 11 units 153 (H)  Novolog 3 units 172 (H) 133 (H)  Novolog 2 units   Review of Glycemic Control  Diabetes history: DM2 Outpatient Diabetes medications: Glipizide 5 mg BID, Metformin 500 mg BID Current orders for Inpatient glycemic control: Novolog 0-15 units TID with meals, Novolog 0-5 units QHS   Inpatient Diabetes Program Recommendations:     Insulin: Please consider ordering Novolog 3 units TID with meals for meal coverage if patient eats at least 50% of meals.   HbgA1C: A1C 5.2% on 11/11/21 indicating an average glucose of 103 mg/dl over the past 2-3 months.    Outpatient DM medications: Initial glucose 38 mg/dl on 11/10/21 at 11:58 and down to 66 mg/dl at 19:35 on 11/10/21. Given noted hypoglycemia and A1C of 5.2%, question if patient is experiencing hypoglycemia frequently outpatient. May want to consider decreasing outpatient DM medications at discharge.  Thanks, Barnie Alderman, RN, MSN, [place] Diabetes Coordinator Inpatient Diabetes Program [phone] (Team Pager from 8am to [place])

## 2021-11-13 NOTE — Discharge Summary (Incomplete)
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  Total Discharge time is about 33 minutes  Roxan Hockey M.D on 11/13/2021 at 11:00 AM  Go to [email or web address] -  for contact info  [facility] - Office  [phone]

## 2021-11-13 NOTE — Progress Notes (Signed)
Addylynn L Yuan to be D/C'd home per MD order. Discussed with the patient and husband and all questions fully answered.  Skin clean, dry and intact without evidence of skin break down, no evidence of skin tears noted.  IV catheter discontinued intact. Site without signs and symptoms of complications. Dressing and pressure applied.  An After Visit Summary was printed and given to the patient. Remainder of Paxlovid pack given to patient and educated. Patient escorted via [place], and D/C home via private auto.  Melonie Florida  11/13/2021 12:46 PM

## 2021-11-13 NOTE — Discharge Instructions (Signed)
1)Avoid ibuprofen/Advil/Aleve/Motrin/Goody Powders/Naproxen/BC powders/Meloxicam/Diclofenac/Indomethacin and other Nonsteroidal anti-inflammatory medications as these will make you more likely to bleed and can cause stomach ulcers, can also cause Kidney problems.   2) repeat CBC blood test with primary care physician within 1 week  3)You are strongly advised to isolate/quarantine for at least 10 days from the date of your diagnosis with COVID-19 infection--please always wear a mask if you have to go outside the house  4)Please take Paxlovid for COVID infection as well as other medications as prescribed

## 2021-11-15 LAB — CULTURE, BLOOD (ROUTINE X 2)
Culture: NO GROWTH
Culture: NO GROWTH
Special Requests: ADEQUATE

## 2021-11-18 ENCOUNTER — Other Ambulatory Visit: Payer: Self-pay | Admitting: Internal Medicine

## 2021-11-18 DIAGNOSIS — Z1231 Encounter for screening mammogram for malignant neoplasm of breast: Secondary | ICD-10-CM

## 2022-01-11 ENCOUNTER — Inpatient Hospital Stay: Admission: RE | Admit: 2022-01-11 | Payer: Medicare Other | Source: Ambulatory Visit

## 2022-01-25 ENCOUNTER — Telehealth: Payer: Self-pay

## 2022-01-25 NOTE — Telephone Encounter (Signed)
NOTES SCANNED TO REFERRAL 

## 2022-02-05 DIAGNOSIS — Z6820 Body mass index (BMI) 20.0-20.9, adult: Secondary | ICD-10-CM | POA: Diagnosis not present

## 2022-02-05 DIAGNOSIS — I10 Essential (primary) hypertension: Secondary | ICD-10-CM | POA: Diagnosis not present

## 2022-02-05 DIAGNOSIS — G894 Chronic pain syndrome: Secondary | ICD-10-CM | POA: Diagnosis not present

## 2022-02-05 DIAGNOSIS — E1140 Type 2 diabetes mellitus with diabetic neuropathy, unspecified: Secondary | ICD-10-CM | POA: Diagnosis not present

## 2022-02-05 DIAGNOSIS — R2681 Unsteadiness on feet: Secondary | ICD-10-CM | POA: Diagnosis not present

## 2022-03-04 ENCOUNTER — Other Ambulatory Visit (HOSPITAL_COMMUNITY): Payer: Self-pay | Admitting: Internal Medicine

## 2022-03-04 DIAGNOSIS — G894 Chronic pain syndrome: Secondary | ICD-10-CM | POA: Diagnosis not present

## 2022-03-04 DIAGNOSIS — R2681 Unsteadiness on feet: Secondary | ICD-10-CM | POA: Diagnosis not present

## 2022-03-04 DIAGNOSIS — Z6821 Body mass index (BMI) 21.0-21.9, adult: Secondary | ICD-10-CM | POA: Diagnosis not present

## 2022-03-04 DIAGNOSIS — E118 Type 2 diabetes mellitus with unspecified complications: Secondary | ICD-10-CM | POA: Diagnosis not present

## 2022-03-04 DIAGNOSIS — Z1231 Encounter for screening mammogram for malignant neoplasm of breast: Secondary | ICD-10-CM | POA: Diagnosis not present

## 2022-03-04 DIAGNOSIS — I10 Essential (primary) hypertension: Secondary | ICD-10-CM | POA: Diagnosis not present

## 2022-03-08 ENCOUNTER — Ambulatory Visit: Payer: Medicare Other | Attending: Cardiovascular Disease | Admitting: Cardiovascular Disease

## 2022-04-15 ENCOUNTER — Encounter (HOSPITAL_COMMUNITY): Payer: Self-pay

## 2022-04-15 ENCOUNTER — Emergency Department (HOSPITAL_COMMUNITY): Payer: Medicare Other

## 2022-04-15 ENCOUNTER — Other Ambulatory Visit: Payer: Self-pay

## 2022-04-15 ENCOUNTER — Inpatient Hospital Stay (HOSPITAL_COMMUNITY): Payer: Medicare Other

## 2022-04-15 ENCOUNTER — Inpatient Hospital Stay (HOSPITAL_COMMUNITY)
Admission: EM | Admit: 2022-04-15 | Discharge: 2022-04-20 | DRG: 871 | Disposition: A | Discharge status: Home Health | Payer: Medicare Other | Attending: Internal Medicine | Admitting: Internal Medicine

## 2022-04-15 DIAGNOSIS — N1831 Chronic kidney disease, stage 3a: Secondary | ICD-10-CM | POA: Diagnosis not present

## 2022-04-15 DIAGNOSIS — Z87440 Personal history of urinary (tract) infections: Secondary | ICD-10-CM

## 2022-04-15 DIAGNOSIS — R4182 Altered mental status, unspecified: Secondary | ICD-10-CM | POA: Diagnosis present

## 2022-04-15 DIAGNOSIS — Z8673 Personal history of transient ischemic attack (TIA), and cerebral infarction without residual deficits: Secondary | ICD-10-CM

## 2022-04-15 DIAGNOSIS — I2510 Atherosclerotic heart disease of native coronary artery without angina pectoris: Secondary | ICD-10-CM | POA: Diagnosis present

## 2022-04-15 DIAGNOSIS — R441 Visual hallucinations: Secondary | ICD-10-CM | POA: Diagnosis not present

## 2022-04-15 DIAGNOSIS — I480 Paroxysmal atrial fibrillation: Secondary | ICD-10-CM | POA: Diagnosis not present

## 2022-04-15 DIAGNOSIS — N179 Acute kidney failure, unspecified: Secondary | ICD-10-CM

## 2022-04-15 DIAGNOSIS — K760 Fatty (change of) liver, not elsewhere classified: Secondary | ICD-10-CM | POA: Diagnosis not present

## 2022-04-15 DIAGNOSIS — B9620 Unspecified Escherichia coli [E. coli] as the cause of diseases classified elsewhere: Secondary | ICD-10-CM | POA: Diagnosis not present

## 2022-04-15 DIAGNOSIS — A419 Sepsis, unspecified organism: Secondary | ICD-10-CM

## 2022-04-15 DIAGNOSIS — R7881 Bacteremia: Secondary | ICD-10-CM | POA: Diagnosis not present

## 2022-04-15 DIAGNOSIS — R6889 Other general symptoms and signs: Secondary | ICD-10-CM | POA: Diagnosis not present

## 2022-04-15 DIAGNOSIS — R569 Unspecified convulsions: Secondary | ICD-10-CM | POA: Diagnosis not present

## 2022-04-15 DIAGNOSIS — M4802 Spinal stenosis, cervical region: Secondary | ICD-10-CM | POA: Diagnosis not present

## 2022-04-15 DIAGNOSIS — A4151 Sepsis due to Escherichia coli [E. coli]: Principal | ICD-10-CM | POA: Diagnosis present

## 2022-04-15 DIAGNOSIS — Z87442 Personal history of urinary calculi: Secondary | ICD-10-CM

## 2022-04-15 DIAGNOSIS — I130 Hypertensive heart and chronic kidney disease with heart failure and stage 1 through stage 4 chronic kidney disease, or unspecified chronic kidney disease: Secondary | ICD-10-CM | POA: Diagnosis not present

## 2022-04-15 DIAGNOSIS — N10 Acute tubulo-interstitial nephritis: Secondary | ICD-10-CM | POA: Diagnosis not present

## 2022-04-15 DIAGNOSIS — D6959 Other secondary thrombocytopenia: Secondary | ICD-10-CM | POA: Diagnosis not present

## 2022-04-15 DIAGNOSIS — Z8669 Personal history of other diseases of the nervous system and sense organs: Secondary | ICD-10-CM | POA: Diagnosis not present

## 2022-04-15 DIAGNOSIS — E1142 Type 2 diabetes mellitus with diabetic polyneuropathy: Secondary | ICD-10-CM | POA: Diagnosis not present

## 2022-04-15 DIAGNOSIS — R509 Fever, unspecified: Secondary | ICD-10-CM | POA: Diagnosis not present

## 2022-04-15 DIAGNOSIS — F419 Anxiety disorder, unspecified: Secondary | ICD-10-CM | POA: Diagnosis present

## 2022-04-15 DIAGNOSIS — Z8249 Family history of ischemic heart disease and other diseases of the circulatory system: Secondary | ICD-10-CM

## 2022-04-15 DIAGNOSIS — N200 Calculus of kidney: Secondary | ICD-10-CM | POA: Diagnosis not present

## 2022-04-15 DIAGNOSIS — Z1152 Encounter for screening for COVID-19: Secondary | ICD-10-CM | POA: Diagnosis not present

## 2022-04-15 DIAGNOSIS — E785 Hyperlipidemia, unspecified: Secondary | ICD-10-CM | POA: Diagnosis present

## 2022-04-15 DIAGNOSIS — R0902 Hypoxemia: Secondary | ICD-10-CM | POA: Diagnosis not present

## 2022-04-15 DIAGNOSIS — Z823 Family history of stroke: Secondary | ICD-10-CM

## 2022-04-15 DIAGNOSIS — R6520 Severe sepsis without septic shock: Secondary | ICD-10-CM | POA: Diagnosis not present

## 2022-04-15 DIAGNOSIS — E039 Hypothyroidism, unspecified: Secondary | ICD-10-CM | POA: Diagnosis not present

## 2022-04-15 DIAGNOSIS — Z87891 Personal history of nicotine dependence: Secondary | ICD-10-CM

## 2022-04-15 DIAGNOSIS — F32A Depression, unspecified: Secondary | ICD-10-CM | POA: Diagnosis not present

## 2022-04-15 DIAGNOSIS — R9431 Abnormal electrocardiogram [ECG] [EKG]: Secondary | ICD-10-CM | POA: Diagnosis not present

## 2022-04-15 DIAGNOSIS — K7200 Acute and subacute hepatic failure without coma: Secondary | ICD-10-CM | POA: Diagnosis present

## 2022-04-15 DIAGNOSIS — R918 Other nonspecific abnormal finding of lung field: Secondary | ICD-10-CM | POA: Diagnosis not present

## 2022-04-15 DIAGNOSIS — M6282 Rhabdomyolysis: Secondary | ICD-10-CM | POA: Diagnosis not present

## 2022-04-15 DIAGNOSIS — M5021 Other cervical disc displacement,  high cervical region: Secondary | ICD-10-CM | POA: Diagnosis not present

## 2022-04-15 DIAGNOSIS — Z8774 Personal history of (corrected) congenital malformations of heart and circulatory system: Secondary | ICD-10-CM

## 2022-04-15 DIAGNOSIS — B9689 Other specified bacterial agents as the cause of diseases classified elsewhere: Secondary | ICD-10-CM | POA: Diagnosis not present

## 2022-04-15 DIAGNOSIS — Z8719 Personal history of other diseases of the digestive system: Secondary | ICD-10-CM

## 2022-04-15 DIAGNOSIS — N3000 Acute cystitis without hematuria: Secondary | ICD-10-CM

## 2022-04-15 DIAGNOSIS — E1122 Type 2 diabetes mellitus with diabetic chronic kidney disease: Secondary | ICD-10-CM | POA: Diagnosis not present

## 2022-04-15 DIAGNOSIS — E1140 Type 2 diabetes mellitus with diabetic neuropathy, unspecified: Secondary | ICD-10-CM

## 2022-04-15 DIAGNOSIS — U071 COVID-19: Secondary | ICD-10-CM | POA: Diagnosis not present

## 2022-04-15 DIAGNOSIS — G253 Myoclonus: Secondary | ICD-10-CM | POA: Diagnosis present

## 2022-04-15 DIAGNOSIS — G9341 Metabolic encephalopathy: Secondary | ICD-10-CM | POA: Diagnosis not present

## 2022-04-15 DIAGNOSIS — G629 Polyneuropathy, unspecified: Secondary | ICD-10-CM | POA: Diagnosis present

## 2022-04-15 DIAGNOSIS — L89152 Pressure ulcer of sacral region, stage 2: Secondary | ICD-10-CM

## 2022-04-15 DIAGNOSIS — Z743 Need for continuous supervision: Secondary | ICD-10-CM | POA: Diagnosis not present

## 2022-04-15 DIAGNOSIS — G894 Chronic pain syndrome: Secondary | ICD-10-CM | POA: Diagnosis present

## 2022-04-15 DIAGNOSIS — Z7982 Long term (current) use of aspirin: Secondary | ICD-10-CM

## 2022-04-15 DIAGNOSIS — Z7984 Long term (current) use of oral hypoglycemic drugs: Secondary | ICD-10-CM

## 2022-04-15 DIAGNOSIS — Z79899 Other long term (current) drug therapy: Secondary | ICD-10-CM

## 2022-04-15 DIAGNOSIS — D6489 Other specified anemias: Secondary | ICD-10-CM | POA: Diagnosis not present

## 2022-04-15 DIAGNOSIS — Z833 Family history of diabetes mellitus: Secondary | ICD-10-CM

## 2022-04-15 DIAGNOSIS — I499 Cardiac arrhythmia, unspecified: Secondary | ICD-10-CM | POA: Diagnosis not present

## 2022-04-15 DIAGNOSIS — Z90710 Acquired absence of both cervix and uterus: Secondary | ICD-10-CM

## 2022-04-15 DIAGNOSIS — I4892 Unspecified atrial flutter: Secondary | ICD-10-CM | POA: Diagnosis not present

## 2022-04-15 DIAGNOSIS — I4510 Unspecified right bundle-branch block: Secondary | ICD-10-CM | POA: Diagnosis present

## 2022-04-15 DIAGNOSIS — M50221 Other cervical disc displacement at C4-C5 level: Secondary | ICD-10-CM | POA: Diagnosis not present

## 2022-04-15 LAB — BLOOD GAS, ARTERIAL
Acid-base deficit: 4.8 mmol/L — ABNORMAL HIGH (ref 0.0–2.0)
Bicarbonate: 19.2 mmol/L — ABNORMAL LOW (ref 20.0–28.0)
Drawn by: 38235
O2 Saturation: 96.2 %
Patient temperature: 37.9
pCO2 arterial: 32 mmHg (ref 32–48)
pH, Arterial: 7.39 (ref 7.35–7.45)
pO2, Arterial: 93 mmHg (ref 83–108)

## 2022-04-15 LAB — CSF CELL COUNT WITH DIFFERENTIAL
Other Cells, CSF: NONE SEEN
RBC Count, CSF: 5325 /mm3 — ABNORMAL HIGH
RBC Count, CSF: 54 /mm3 — ABNORMAL HIGH
Tube #: 1
Tube #: 4
WBC, CSF: 6 /mm3 — ABNORMAL HIGH (ref 0–5)
WBC, CSF: 2 /mm3 (ref 0–5)

## 2022-04-15 LAB — RAPID URINE DRUG SCREEN, HOSP PERFORMED
Amphetamines: NOT DETECTED
Barbiturates: NOT DETECTED
Benzodiazepines: POSITIVE — AB
Cocaine: NOT DETECTED
Opiates: POSITIVE — AB
Tetrahydrocannabinol: NOT DETECTED

## 2022-04-15 LAB — COMPREHENSIVE METABOLIC PANEL
ALT: 57 U/L — ABNORMAL HIGH (ref 0–44)
AST: 130 U/L — ABNORMAL HIGH (ref 15–41)
Albumin: 3.5 g/dL (ref 3.5–5.0)
Alkaline Phosphatase: 173 U/L — ABNORMAL HIGH (ref 38–126)
Anion gap: 15 (ref 5–15)
BUN: 96 mg/dL — ABNORMAL HIGH (ref 8–23)
CO2: 20 mmol/L — ABNORMAL LOW (ref 22–32)
Calcium: 8.8 mg/dL — ABNORMAL LOW (ref 8.9–10.3)
Chloride: 98 mmol/L (ref 98–111)
Creatinine, Ser: 4.08 mg/dL — ABNORMAL HIGH (ref 0.44–1.00)
GFR, Estimated: 12 mL/min — ABNORMAL LOW (ref >60)
Glucose, Bld: 207 mg/dL — ABNORMAL HIGH (ref 70–99)
Potassium: 4 mmol/L (ref 3.5–5.1)
Sodium: 133 mmol/L — ABNORMAL LOW (ref 135–145)
Total Bilirubin: 1 mg/dL (ref 0.3–1.2)
Total Protein: 7.7 g/dL (ref 6.5–8.1)

## 2022-04-15 LAB — URINALYSIS, ROUTINE W REFLEX MICROSCOPIC
Bilirubin Urine: NEGATIVE
Glucose, UA: NEGATIVE mg/dL
Ketones, ur: NEGATIVE mg/dL
Nitrite: NEGATIVE
Protein, ur: 100 mg/dL — AB
Specific Gravity, Urine: 1.015 (ref 1.005–1.030)
pH: 6 (ref 5.0–8.0)

## 2022-04-15 LAB — CBC WITH DIFFERENTIAL/PLATELET
Abs Immature Granulocytes: 0.1 10*3/uL — ABNORMAL HIGH (ref 0.00–0.07)
Band Neutrophils: 10 %
Basophils Absolute: 0 10*3/uL (ref 0.0–0.1)
Basophils Relative: 0 %
Eosinophils Absolute: 0 10*3/uL (ref 0.0–0.5)
Eosinophils Relative: 0 %
HCT: 41.5 % (ref 36.0–46.0)
Hemoglobin: 13.2 g/dL (ref 12.0–15.0)
Lymphocytes Relative: 1 %
Lymphs Abs: 0.1 10*3/uL — ABNORMAL LOW (ref 0.7–4.0)
MCH: 25.3 pg — ABNORMAL LOW (ref 26.0–34.0)
MCHC: 31.8 g/dL (ref 30.0–36.0)
MCV: 79.5 fL — ABNORMAL LOW (ref 80.0–100.0)
Monocytes Absolute: 0.2 10*3/uL (ref 0.1–1.0)
Monocytes Relative: 3 %
Neutro Abs: 5.9 10*3/uL (ref 1.7–7.7)
Neutrophils Relative %: 85 %
Platelets: 84 10*3/uL — ABNORMAL LOW (ref 150–400)
Promyelocytes Relative: 1 %
RBC: 5.22 MIL/uL — ABNORMAL HIGH (ref 3.87–5.11)
RDW: 16.2 % — ABNORMAL HIGH (ref 11.5–15.5)
WBC: 6.2 10*3/uL (ref 4.0–10.5)
nRBC: 0 % (ref 0.0–0.2)

## 2022-04-15 LAB — PROTEIN, CSF: Total  Protein, CSF: 44 mg/dL (ref 15–45)

## 2022-04-15 LAB — GLUCOSE, CAPILLARY
Glucose-Capillary: 154 mg/dL — ABNORMAL HIGH (ref 70–99)
Glucose-Capillary: 187 mg/dL — ABNORMAL HIGH (ref 70–99)

## 2022-04-15 LAB — RESP PANEL BY RT-PCR (FLU A&B, COVID) ARPGX2
Influenza A by PCR: NEGATIVE
Influenza B by PCR: NEGATIVE
SARS Coronavirus 2 by RT PCR: NEGATIVE

## 2022-04-15 LAB — AMMONIA: Ammonia: 18 umol/L (ref 9–35)

## 2022-04-15 LAB — GRAM STAIN

## 2022-04-15 LAB — MAGNESIUM: Magnesium: 2.4 mg/dL (ref 1.7–2.4)

## 2022-04-15 LAB — BLOOD GAS, VENOUS
Acid-base deficit: 9.1 mmol/L — ABNORMAL HIGH (ref 0.0–2.0)
Bicarbonate: 17.5 mmol/L — ABNORMAL LOW (ref 20.0–28.0)
Drawn by: 66297
O2 Saturation: 34.4 %
Patient temperature: 37.2
pCO2, Ven: 40 mmHg — ABNORMAL LOW (ref 44–60)
pH, Ven: 7.25 (ref 7.25–7.43)
pO2, Ven: <31 mmHg — CL (ref 32–45)

## 2022-04-15 LAB — GLUCOSE, CSF: Glucose, CSF: 135 mg/dL — ABNORMAL HIGH (ref 40–70)

## 2022-04-15 LAB — PHOSPHORUS: Phosphorus: 2.8 mg/dL (ref 2.5–4.6)

## 2022-04-15 LAB — LACTIC ACID, PLASMA
Lactic Acid, Venous: 2.4 mmol/L (ref 0.5–1.9)
Lactic Acid, Venous: 5.3 mmol/L (ref 0.5–1.9)

## 2022-04-15 LAB — CBG MONITORING, ED: Glucose-Capillary: 223 mg/dL — ABNORMAL HIGH (ref 70–99)

## 2022-04-15 LAB — CK: Total CK: 7680 U/L — ABNORMAL HIGH (ref 38–234)

## 2022-04-15 MED ORDER — SODIUM CHLORIDE 0.9 % IV BOLUS
1000.0000 mL | Freq: Once | INTRAVENOUS | Status: AC
  Administered 2022-04-15: 1000 mL via INTRAVENOUS

## 2022-04-15 MED ORDER — LACTATED RINGERS IV BOLUS (SEPSIS)
1000.0000 mL | Freq: Once | INTRAVENOUS | Status: AC
  Administered 2022-04-15: 1000 mL via INTRAVENOUS

## 2022-04-15 MED ORDER — LORAZEPAM 2 MG/ML IJ SOLN: 1.0000 mg | Freq: Once | INTRAMUSCULAR | Status: AC

## 2022-04-15 MED ORDER — SODIUM CHLORIDE 0.9 % IV SOLN
2.0000 g | Freq: Once | INTRAVENOUS | Status: AC
  Administered 2022-04-15: 2 g via INTRAVENOUS
  Filled 2022-04-15: qty 20

## 2022-04-15 MED ORDER — LACTATED RINGERS IV BOLUS (SEPSIS)
500.0000 mL | Freq: Once | INTRAVENOUS | Status: AC
  Administered 2022-04-15: 500 mL via INTRAVENOUS

## 2022-04-15 MED ORDER — VANCOMYCIN HCL IN DEXTROSE 1-5 GM/200ML-% IV SOLN
1000.0000 mg | Freq: Once | INTRAVENOUS | Status: AC
  Administered 2022-04-15: 1000 mg via INTRAVENOUS
  Filled 2022-04-15: qty 200

## 2022-04-15 MED ORDER — CHLORHEXIDINE GLUCONATE CLOTH 2 % EX PADS
6.0000 | MEDICATED_PAD | Freq: Every day | CUTANEOUS | Status: DC
  Administered 2022-04-15 – 2022-04-18 (×4): 6 via TOPICAL

## 2022-04-15 MED ORDER — ACETAMINOPHEN 650 MG RE SUPP
650.0000 mg | Freq: Once | RECTAL | Status: AC
  Administered 2022-04-15: 650 mg via RECTAL
  Filled 2022-04-15: qty 1

## 2022-04-15 MED ORDER — LORAZEPAM 2 MG/ML IJ SOLN
INTRAMUSCULAR | Status: AC
  Administered 2022-04-15: 1 mg via INTRAVENOUS
  Filled 2022-04-15: qty 1

## 2022-04-15 MED ORDER — SODIUM CHLORIDE 0.9 % IV SOLN
60.0000 mg/kg | Freq: Once | INTRAVENOUS | Status: AC
  Administered 2022-04-15: 3300 mg via INTRAVENOUS
  Filled 2022-04-15: qty 33

## 2022-04-15 MED ORDER — LACTATED RINGERS IV BOLUS (SEPSIS)
250.0000 mL | Freq: Once | INTRAVENOUS | Status: AC
  Administered 2022-04-15: 250 mL via INTRAVENOUS

## 2022-04-15 MED ORDER — VANCOMYCIN VARIABLE DOSE PER UNSTABLE RENAL FUNCTION (PHARMACIST DOSING): Status: DC

## 2022-04-15 NOTE — ED Notes (Signed)
Lelon Frohlich, RN, [facility] made aware of need for Keppra from pharmacy

## 2022-04-15 NOTE — ED Provider Notes (Signed)
[facility] EMERGENCY DEPARTMENT Provider Note   CSN: [id number] Arrival date & time: 04/15/22  1418     History  Chief Complaint  Patient presents with   Altered Mental Status    Anita [name] is a 61 y.o. female.  Pt is a 61 yo female with a pmhx significant for chronic pain, htn, dm2, anxiety, depression, CAD, HLD, TIAs and kidney stones.  Pt lives at home.  Husband called EMS due to a seizure.  She had another seizure in route and 2 mg versed iv given to patient.  She was incontinent of urine.  She is unable to give any hx.  O2 sat 89% on RA and she was put on 2L with O2 sat improved to upper 90s.    I tried to get ahold of pt's husband, but there was no answer.  Pt's daughter is here.  She said pt had 2 seizures yesterday and 2 seizures today.  Seizures were twitching all over.  It is unclear if pt was awake or not as she did not see them.         Home Medications Prior to Admission medications   Medication Sig Start Date End Date Taking? Authorizing Provider  Oxycodone HCl 10 MG TABS Take 10 mg by mouth every 6 (six) hours as needed. 04/07/22  Yes [provider]  acetaminophen (TYLENOL) 325 MG tablet Take 2 tablets (650 mg total) by mouth every 6 (six) hours as needed for mild pain, headache or fever (or Fever >/= 101). 11/13/21   Roxan Hockey, MD  ALPRAZolam Duanne Moron) 1 MG tablet Take 0.5 mg by mouth in the morning, at noon, in the evening, and at bedtime.    [provider]  amLODipine (NORVASC) 10 MG tablet Take 1 tablet (10 mg total) by mouth daily. 11/14/21   Roxan Hockey, MD  ascorbic acid (VITAMIN C) 500 MG tablet Take 1 tablet (500 mg total) by mouth daily. 11/14/21   Roxan Hockey, MD  aspirin EC 81 MG tablet Take 1 tablet (81 mg total) by mouth daily with breakfast. 11/13/21 11/13/22  Roxan Hockey, MD  atorvastatin (LIPITOR) 20 MG tablet Take 20 mg by mouth daily. 1 Tablet Daily    [provider]  Cholecalciferol (VITAMIN D-3) 125  MCG (5000 UT) TABS Take 5,000 Units by mouth daily.    [provider]  EPINEPHrine 0.3 mg/0.3 mL IJ SOAJ injection Inject 0.3 mg into the muscle as needed for anaphylaxis. Inject as needed for anaphlaxis    [provider]  gabapentin (NEURONTIN) 600 MG tablet Take 600 mg by mouth 3 (three) times daily. 10/27/21   [provider]  glipiZIDE (GLUCOTROL) 5 MG tablet Take 5 mg by mouth 3 (three) times daily. 06/29/19   [provider]  hydrALAZINE (APRESOLINE) 50 MG tablet Take 1 tablet (50 mg total) by mouth 2 (two) times daily. 11/13/21 11/13/22  Roxan Hockey, MD  isosorbide mononitrate (IMDUR) 30 MG 24 hr tablet Take 1 tablet (30 mg total) by mouth daily. 11/13/21 11/13/22  Roxan Hockey, MD  levothyroxine (SYNTHROID) 25 MCG tablet Take 25 mcg by mouth daily. 10/05/21   [provider]  metFORMIN (GLUCOPHAGE) 500 MG tablet Take 500 mg by mouth 2 (two) times daily with a meal.    [provider]  metoprolol tartrate (LOPRESSOR) 100 MG tablet Take 100 mg by mouth 2 (two) times a day. 06/15/18   [provider]  oxyCODONE (OXY IR/ROXICODONE) 5 MG immediate release tablet  Take 15 mg by mouth every 6 (six) hours as needed (pain.). 08/29/19   [provider]  Potassium 99 MG TABS Take 99 mg by mouth daily.    [provider]  tiZANidine (ZANAFLEX) 4 MG tablet Take 1 tablet (4 mg total) by mouth at bedtime. 11/13/21   Roxan Hockey, MD  zinc sulfate 220 (50 Zn) MG capsule Take 1 capsule (220 mg total) by mouth daily. 11/14/21   Roxan Hockey, MD      Allergies    Ramipril, Imitrex [sumatriptan], Lyrica [pregabalin], and Penicillins    Review of Systems   Review of Systems  Unable to perform ROS: Mental status change  All other systems reviewed and are negative.   Physical Exam Updated Vital Signs BP (!) 135/55   Pulse (!) 102   Temp (!) 100.4 F (38 C) (Axillary)   Resp 14   Ht '5\' 5"'$  (1.651 m)   Wt 55 kg    SpO2 100%   BMI 20.18 kg/m  Physical Exam Vitals and nursing note reviewed.  HENT:     Head: Normocephalic and atraumatic.     Right Ear: External ear normal.     Left Ear: External ear normal.     Nose: Nose normal.     Mouth/Throat:     Mouth: Mucous membranes are dry.  Eyes:     Pupils: Pupils are equal, round, and reactive to light.  Cardiovascular:     Rate and Rhythm: Regular rhythm. Tachycardia present.     Pulses: Normal pulses.     Heart sounds: Normal heart sounds.  Pulmonary:     Effort: Pulmonary effort is normal.     Breath sounds: Normal breath sounds.  Abdominal:     General: Abdomen is flat. Bowel sounds are normal.     Palpations: Abdomen is soft.  Musculoskeletal:     Cervical back: Normal range of motion and neck supple.     Comments: Muscle fasciculations both arms and legs  Skin:    General: Skin is warm.     Capillary Refill: Capillary refill takes less than 2 seconds.     Comments: Stage 2 sacral decub  Neurological:     Mental Status: She is lethargic.     Comments: Pt sedated.  I am unable to assess orientation or motor/sensation.  Pt's body feels stiff and it's hard to move her limbs.  Psychiatric:     Comments: Unable to assess     ED Results / Procedures / Treatments   Labs (all labs ordered are listed, but only abnormal results are displayed) Labs Reviewed  LACTIC ACID, PLASMA - Abnormal; Notable for the following components:      Result Value   Lactic Acid, Venous 2.4 (*)    All other components within normal limits  LACTIC ACID, PLASMA - Abnormal; Notable for the following components:   Lactic Acid, Venous 5.3 (*)    All other components within normal limits  COMPREHENSIVE METABOLIC PANEL - Abnormal; Notable for the following components:   Sodium 133 (*)    CO2 20 (*)    Glucose, Bld 207 (*)    BUN 96 (*)    Creatinine, Ser 4.08 (*)    Calcium 8.8 (*)    AST 130 (*)    ALT 57 (*)    Alkaline Phosphatase 173 (*)    GFR,  Estimated 12 (*)    All other components within normal limits  CBC WITH DIFFERENTIAL/PLATELET - Abnormal; Notable  for the following components:   RBC 5.22 (*)    MCV 79.5 (*)    MCH 25.3 (*)    RDW 16.2 (*)    Platelets 84 (*)    Lymphs Abs 0.1 (*)    Abs Immature Granulocytes 0.10 (*)    All other components within normal limits  URINALYSIS, ROUTINE W REFLEX MICROSCOPIC - Abnormal; Notable for the following components:   APPearance CLOUDY (*)    Hgb urine dipstick LARGE (*)    Protein, ur 100 (*)    Leukocytes,Ua MODERATE (*)    Bacteria, UA MANY (*)    All other components within normal limits  RAPID URINE DRUG SCREEN, HOSP PERFORMED - Abnormal; Notable for the following components:   Opiates POSITIVE (*)    Benzodiazepines POSITIVE (*)    All other components within normal limits  CSF CELL COUNT WITH DIFFERENTIAL - Abnormal; Notable for the following components:   Color, CSF PINK (*)    Appearance, CSF HAZY (*)    RBC Count, CSF 5,325 (*)    WBC, CSF 6 (*)    All other components within normal limits  CSF CELL COUNT WITH DIFFERENTIAL - Abnormal; Notable for the following components:   RBC Count, CSF 54 (*)    All other components within normal limits  GLUCOSE, CSF - Abnormal; Notable for the following components:   Glucose, CSF 135 (*)    All other components within normal limits  CK - Abnormal; Notable for the following components:   Total CK 7,680 (*)    All other components within normal limits  BLOOD GAS, VENOUS - Abnormal; Notable for the following components:   pCO2, Ven 40 (*)    pO2, Ven <31 (*)    Bicarbonate 17.5 (*)    Acid-base deficit 9.1 (*)    All other components within normal limits  CBG MONITORING, ED - Abnormal; Notable for the following components:   Glucose-Capillary 223 (*)    All other components within normal limits  RESP PANEL BY RT-PCR (FLU A&B, COVID) ARPGX2  CULTURE, BLOOD (ROUTINE X 2)  CULTURE, BLOOD (ROUTINE X 2)  GRAM STAIN  URINE  CULTURE  CSF CULTURE W GRAM STAIN  PROTEIN, CSF  MAGNESIUM  AMMONIA  HSV 1/2 PCR, CSF  PHOSPHORUS  BLOOD GAS, ARTERIAL    EKG EKG Interpretation  Date/Time:  Thursday April 15 2022 14:36:23 EST Ventricular Rate:  104 PR Interval:  141 QRS Duration: 88 QT Interval:  325 QTC Calculation: 428 R Axis:   63 Text Interpretation: Sinus tachycardia RSR' in V1 or V2, right VCD or RVH No significant change since last tracing Confirmed by Isla Pence [phone]) on 04/15/2022 3:25:08 PM  Radiology CT CHEST ABDOMEN PELVIS WO CONTRAST  Result Date: 04/15/2022 CLINICAL DATA:  Seizure activity today, possible sepsis EXAM: CT CHEST, ABDOMEN AND PELVIS WITHOUT CONTRAST TECHNIQUE: Multidetector CT imaging of the chest, abdomen and pelvis was performed following the standard protocol without IV contrast. RADIATION DOSE REDUCTION: This exam was performed according to the departmental dose-optimization program which includes automated exposure control, adjustment of the mA and/or kV according to patient size and/or use of iterative reconstruction technique. COMPARISON:  Multiple priors including CT November 10, 2021 FINDINGS: CT CHEST FINDINGS Cardiovascular: Aortic atherosclerosis. Enlarged main pulmonary artery measures 3.2 cm. Cardiac enlargement. Coronary artery calcifications. Mediastinum/Nodes: Partially calcified 9 mm right thyroid nodule. Not clinically significant; no follow-up imaging recommended (ref: J Am Coll Radiol. 2015 Feb;12(2): 143-50).No pathologically enlarged mediastinal, hilar or  axillary lymph nodes, noting limited sensitivity for the detection of hilar adenopathy on this noncontrast study. Patulous esophagus with retained/refluxed contents in the esophagus. Lungs/Pleura: Patchy bilateral ground-glass opacities with multifocal linear/banded consolidative opacities likely reflect a combination of atelectasis and an infectious or inflammatory process. No pleural effusion. No pneumothorax.  Musculoskeletal: Prior median sternotomy. No acute osseous abnormality. CT ABDOMEN PELVIS FINDINGS Hepatobiliary: Unremarkable noncontrast enhanced appearance of the hepatic parenchyma. Gallbladder is distended. No biliary ductal dilation. Pancreas: No pancreatic ductal dilation or evidence of acute inflammation. Spleen: No splenomegaly. Adrenals/Urinary Tract: Similar thickening of the bilateral adrenal glands without discrete nodularity, favored benign hyperplasia and requiring no follow-up. Perinephric and Peri pelvic stranding. Nonobstructive 9 mm left upper pole renal stone. Hydronephrosis. Gas in the urinary bladder. Stomach/Bowel: Stomach is minimally distended limiting evaluation. No pathologic dilation of small or large bowel. Normal appendix. Moderate volume of formed stool throughout the colon. Mild rectal and sigmoid colon wall thickening for instance on 105/2. Vascular/Lymphatic: Aortic atherosclerosis. No pathologically enlarged abdominal or pelvic lymph nodes. Reproductive: Status post hysterectomy. No adnexal masses. Other: Mild nonspecific body wall edema. No significant abdominopelvic free fluid. No walled off fluid collections. Musculoskeletal: No acute osseous abnormality. IMPRESSION: 1. Patchy bilateral ground-glass opacities with multifocal linear/banded consolidative opacities likely reflect a combination of atelectasis and an infectious or inflammatory process. 2. Mild rectal and sigmoid colon wall thickening, which may reflect a nonspecific proctocolitis. 3. Mild perinephric and peripelvic stranding with a nonobstructive 9 mm left upper pole renal stone. Suggest correlation with laboratory values for infection. 4. Gas in the urinary bladder, correlate for recent instrumentation. 5. Moderate volume of formed stool throughout the colon. Correlate for constipation. 6. Enlarged main pulmonary artery, which can be seen in the setting of pulmonary arterial hypertension. 7.  Aortic Atherosclerosis  (ICD10-I70.0). Electronically Signed   By: Dahlia Bailiff M.D.   On: 04/15/2022 18:48   CT Head Wo Contrast  Result Date: 04/15/2022 CLINICAL DATA:  Seizure. EXAM: CT HEAD WITHOUT CONTRAST TECHNIQUE: Contiguous axial images were obtained from the base of the skull through the vertex without intravenous contrast. RADIATION DOSE REDUCTION: This exam was performed according to the departmental dose-optimization program which includes automated exposure control, adjustment of the mA and/or kV according to patient size and/or use of iterative reconstruction technique. COMPARISON:  November 10, 2021. FINDINGS: Brain: No evidence of acute infarction, hemorrhage, hydrocephalus, extra-axial collection or mass lesion/mass effect. Vascular: No hyperdense vessel or unexpected calcification. Skull: Normal. Negative for fracture or focal lesion. Sinuses/Orbits: No acute finding. Other: None. IMPRESSION: No acute intracranial abnormality seen. Electronically Signed   By: Marijo Conception M.D.   On: 04/15/2022 15:21   DG Chest Port 1 View  Result Date: 04/15/2022 CLINICAL DATA:  Seizure EXAM: PORTABLE CHEST 1 VIEW COMPARISON:  11/10/2021 FINDINGS: Single frontal view of the chest demonstrates a stable cardiac silhouette. Postsurgical changes from median sternotomy. No airspace disease, effusion, or pneumothorax. No acute bony abnormalities. IMPRESSION: 1. No acute intrathoracic process. Electronically Signed   By: Randa Ngo M.D.   On: 04/15/2022 15:05    Procedures .Lumbar Puncture  Date/Time: 04/15/2022 4:07 PM  Performed by: Isla Pence, MD Authorized by: Isla Pence, MD   Consent:    Consent obtained:  Emergent situation   Risks discussed:  Bleeding, infection and pain   Alternatives discussed:  No treatment Universal protocol:    Patient identity confirmed:  Arm band Pre-procedure details:    Procedure purpose:  Diagnostic   Preparation: Patient  was prepped and draped in usual sterile fashion    Anesthesia:    Anesthesia method:  Local infiltration   Local anesthetic:  Lidocaine 1% w/o epi Procedure details:    Lumbar space:  L4-L5 interspace   Patient position:  R lateral decubitus   Needle gauge:  20   Needle type:  Spinal needle - Quincke tip   Needle length (in):  3.5   Ultrasound guidance: no     Number of attempts:  2   Fluid appearance:  Clear   Tubes of fluid:  4   Total volume (ml):  4 Post-procedure details:    Puncture site:  Adhesive bandage applied   Procedure completion:  Tolerated well, no immediate complications     Medications Ordered in ED Medications  vancomycin variable dose per unstable renal function (pharmacist dosing) (has no administration in time range)  levETIRAcetam (KEPPRA) 3,300 mg in sodium chloride 0.9 % 250 mL IVPB (has no administration in time range)  lactated ringers bolus 1,000 mL (1,000 mLs Intravenous New Bag/Given 04/15/22 1900)    And  lactated ringers bolus 500 mL (has no administration in time range)    And  lactated ringers bolus 250 mL (has no administration in time range)  acetaminophen (TYLENOL) suppository 650 mg (650 mg Rectal Given 04/15/22 1521)  sodium chloride 0.9 % bolus 1,000 mL (0 mLs Intravenous Stopped 04/15/22 1703)  cefTRIAXone (ROCEPHIN) 2 g in sodium chloride 0.9 % 100 mL IVPB (0 g Intravenous Stopped 04/15/22 1605)  vancomycin (VANCOCIN) IVPB 1000 mg/200 mL premix (0 mg Intravenous Stopped 04/15/22 1710)  sodium chloride 0.9 % bolus 1,000 mL (0 mLs Intravenous Stopped 04/15/22 1817)  LORazepam (ATIVAN) injection 1 mg (1 mg Intravenous Given 04/15/22 1744)    ED Course/ Medical Decision Making/ A&P                           Medical Decision Making Amount and/or Complexity of Data Reviewed Labs: ordered. Radiology: ordered.  Risk OTC drugs. Prescription drug management. Decision regarding hospitalization.   This patient presents to the ED for concern of seizure, this involves an extensive number  of treatment options, and is a complaint that carries with it a high risk of complications and morbidity.  The differential diagnosis includes brain abn, meningitis, infection, electrolyte abn   Co morbidities that complicate the patient evaluation  chronic pain, htn, dm2, anxiety, depression, CAD, HLD, TIAs and kidney stones   Additional history obtained:  Additional history obtained from epic chart review External records from outside source obtained and reviewed including EMS report   Lab Tests:  I Ordered, and personally interpreted labs.  The pertinent results include:  UA + opiates and bzd; ua with lg hgb, mod LE, and many bacteria; covid/flu neg; uds + opiates and bzd (she did receive versed by ems prior to ua collection); ck elevated at 7680; vbg with ph 7.25 and pco2 40; cxf without organism on gram stain, glucose csf elevated, protein nl (doubt bacterial meningitis)   Imaging Studies ordered:  I ordered imaging studies including cxr and ct head  I independently visualized and interpreted imaging which showed  CXR: 1. No acute intrathoracic process.  CT head: No acute intracranial abnormality seen.  CT chest/abd/pelvis: Patchy bilateral ground-glass opacities with multifocal  linear/banded consolidative opacities likely reflect a combination  of atelectasis and an infectious or inflammatory process.  2. Mild rectal and sigmoid colon wall thickening, which may reflect  a nonspecific proctocolitis.  3. Mild perinephric and peripelvic stranding with a nonobstructive 9  mm left upper pole renal stone. Suggest correlation with laboratory  values for infection.  4. Gas in the urinary bladder, correlate for recent instrumentation.  5. Moderate volume of formed stool throughout the colon. Correlate  for constipation.  6. Enlarged main pulmonary artery, which can be seen in the setting  of pulmonary arterial hypertension.  7.  Aortic Atherosclerosis (ICD10-I70.0).   I agree  with the radiologist interpretation   Cardiac Monitoring:  The patient was maintained on a cardiac monitor.  I personally viewed and interpreted the cardiac monitored which showed an underlying rhythm of: sinus tachy   Medicines ordered and prescription drug management:  I ordered medication including tylenol  for fever and rocephin/vanc for possible meningitis  Reevaluation of the patient after these medicines showed that the patient stayed the same I have reviewed the patients home medicines and have made adjustments as needed   Test Considered:  ct   Critical Interventions:  Ivfs, iv abx   Consultations Obtained:  I requested consultation with the neurologist (Dr. Cheral Marker),  and discussed lab and imaging findings as well as pertinent plan -he recommends iv keppra in case she is having seizures.  He recommends tx to [facility] so a neurologist can see her. Pt d/w Dr. Chase Caller (pulm critical care).  He recommends tx to [facility] or to [facility] for admission to the ICU.     Problem List / ED Course:  Metabolic encephalopathy with fever.  LP without any organisms on gram stain.  Pt given rocephin and vanc prior to lp in case she did have meningitis.  Pt does have a uti and a possible pna on ct.  Pt given rocephin/vancomycin per pharm Rhabdo:  pt given aggressive hydration.  It is unclear if this is from muscle fasciculations/seizure/or lying in one place AKI:  pt given aggressive hydration Muscle fasciculations:  pt is awake and looking at me and talking, so if it is a seizure, it is atypical.  She is also very stiff.  She is given keppra in case it is an atypical seizure.  Serotonin syndrome considered, but she is not on a ssri. Social situation:  daughter said pt's home is filthy.  She does not think pt is able to take care of herself and pt's husband is just as bad.  She does not think pt has been eating or drinking much.  She does not know if pt's been taking her meds.  She has taken too much of  her pain med in the past.    Reevaluation:  After the interventions noted above, I reevaluated the patient and found that they have :stayed the same   Social Determinants of Health:  Lives at home   Dispostion:  After consideration of the diagnostic results and the patients response to treatment, I feel that the patent would benefit from admission.    CRITICAL CARE Performed by: Isla Pence   Total critical care time: 60 minutes  Critical care time was exclusive of separately billable procedures and treating other patients.  Critical care was necessary to treat or prevent imminent or life-threatening deterioration.  Critical care was time spent personally by me on the following activities: development of treatment plan with patient and/or surrogate as well as nursing, discussions with consultants, evaluation of patient's response to treatment, examination of patient, obtaining history from patient or surrogate, ordering and performing treatments and interventions, ordering and review of  laboratory studies, ordering and review of radiographic studies, pulse oximetry and re-evaluation of patient's condition.         Final Clinical Impression(s) / ED Diagnoses Final diagnoses:  Acute metabolic encephalopathy  Sepsis with acute renal failure without septic shock, due to unspecified organism, unspecified acute renal failure type ([place])  AKI (acute kidney injury) ([place])  Non-traumatic rhabdomyolysis  Acute cystitis without hematuria  Sacral decubitus ulcer, stage II ([place])    Rx / DC Orders ED Discharge Orders     None         Isla Pence, MD 04/15/22 2020

## 2022-04-15 NOTE — ED Triage Notes (Signed)
Patient coming from home with husband for apparent seizure. Per EMS, patient had jerking motion in route and was given '2mg'$  IV versed. Patient does not have hx of seizures and is off foul smelling; incontinent of urine on arrival.  Patient AMS and alert only with pain response and unable to follow commands upon arrival as well with CBG 280. 89% RA on arrival and placed on 2L/[place].

## 2022-04-15 NOTE — ED Notes (Signed)
Date and time results received: 04/15/22 1632  (use smartphrase ".now" to insert current time)  Test: Lactic acid Critical Value: 2.4  Name of Provider Notified: Dr. Gilford Raid  Orders Received? Or Actions Taken?: see chart

## 2022-04-15 NOTE — ED Notes (Signed)
MD Haviland, at bedside for LP.
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
## 2022-04-15 NOTE — Progress Notes (Addendum)
eLink Physician-Brief Progress Note Patient Name: Anita [name] DOB: [birth year] MRN: [id number]   Date of Service  04/15/2022  HPI/Events of Note  Multiple issues: 1. Nursing request for f/u Lactic Acid level for sepsis protocol. However, increased initial Lactic Acid level likely d/t her seizure. 2. Urinary retention - Bladder scan reveals 457 mL.   eICU Interventions  Plan: Lactic Acid level now. I/O cath PRN.      Intervention Category Major Interventions: Acid-Base disturbance - evaluation and management;Other:  Lysle Dingwall 04/15/2022, 10:42 PM

## 2022-04-15 NOTE — Progress Notes (Signed)
Pharmacy Antibiotic Note  Anita [name] is a 61 y.o. female admitted on 04/15/2022 with meningitis.  Pharmacy has been consulted for Vancomycin dosing. AKI, baseline scr 1.0, current Scr 4.08 AMS Plan: Vancomycin 1gm IV x 1 now, AKI variable dosing  F/U cxs and clinical progress Monitor V/S, labs and levels as indicated  Height: '5\' 5"'$  (165.1 cm) Weight: 55 kg (121 lb 4.1 oz) IBW/kg (Calculated) : 57  Temp (24hrs), Avg:103.2 F (39.6 C), Min:102.6 F (39.2 C), Max:103.7 F (39.8 C)  Recent Labs  Lab 04/15/22 1453  CREATININE 4.08*    Estimated Creatinine Clearance: 12.6 mL/min (A) (by C-G formula based on SCr of 4.08 mg/dL (H)).    Allergies  Allergen Reactions   Ramipril     Possible ACE-I induced angioedema    Imitrex [Sumatriptan] Swelling    States she had swelling in neck   Lyrica [Pregabalin] Other (See Comments)    Severe Leg and feet swelling   Penicillins Rash    Antimicrobials this admission: Vancomycin 11/16 >>  Ceftriaxone 11/16 >>   Microbiology results: 11/16 BCx: pending 11/16 UCx: pending  11/16 CSF: pending    Thank you for allowing pharmacy to be a part of this patient's care.  Isac Sarna, BS Pharm D, BCPS Clinical Pharmacist 04/15/2022 4:31 PM
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
## 2022-04-15 NOTE — H&P (Addendum)
NAME:  DEBBORA ANG, MRN:  [id number], DOB:  [birth year], LOS: 0 ADMISSION DATE:  04/15/2022, CONSULTATION DATE:  11/16 REFERRING MD:  Dr. Gilford Raid EDP, CHIEF COMPLAINT:  Seizure   History of Present Illness:  61 year old female with past medical history as below, which is significant for diabetes, atrial septal defect, coronary artery disease, hypertension, stroke, and peripheral neuropathy.  Admitted in June of this year with acute metabolic encephalopathy felt to be secondary to either hypertensive crisis versus hypoglycemia.  Improved with treatment of the aforementioned conditions.  Patient now presented to [facility] emergency department on 11/16 with complaints of seizure.  She does not have a seizure history, however, she was noted to have jerking movements at home and was incontinent of urine.  She was administered 2 mg of Versed by EMS with improvement of seizure.  She was postictal and minimally responsive upon arrival to the emergency department but mental status did improve.  Fever noted in the ED with temperature up to 103.7 Fahrenheit.  Lumbar puncture was performed in the emergency department.  Neurology at [facility] was consulted and recommended transfer to a [place] for neurology evaluation.  PCCM has been asked to admit.  Pertinent  Medical History   has a past medical history of Anemia, Anginal pain ([place]), Anxiety, ASD (atrial septal defect) (1989), Chronic abdominal pain, Chronic nausea, Chronic neck pain, Complication of anesthesia, Constipation, Coronary artery disease, Depression, Diabetes mellitus, Dyspnea, Heart murmur, History of cardiac catheterization (02/15/11 Dr. Shelva Majestic), History of kidney stones, Hyperlipidemia, Hypertension, Incomplete RBBB, Migraine headache, Nerve damage, Nonalcoholic fatty liver disease (09/30/2012), Ovarian cyst, Pain management, Peripheral neuropathy, Pneumonia, Stroke ([place]), SVD (spontaneous vaginal delivery), and Urinary tract  infection.,hp  Significant Hospital Events: Including procedures, antibiotic start and stop dates in addition to other pertinent events   11/16 present to [facility] with sz. Tx to [facility] for neuro eval.   Interim History / Subjective:    Objective   Blood pressure (!) 135/55, pulse (!) 102, temperature (!) 100.4 F (38 C), temperature source Axillary, resp. rate 14, height '5\' 5"'$  (1.651 m), weight 55 kg, SpO2 100 %.        Intake/Output Summary (Last 24 hours) at 04/15/2022 1957 Last data filed at 04/15/2022 1817 Gross per 24 hour  Intake 2298 ml  Output 13 ml  Net 2285 ml   Filed Weights   04/15/22 1449  Weight: 55 kg    Examination: General: Middle aged female in NAD HENT: [place]/AT, PERRL, no JVD Lungs: Clear, bilateral breath sounds Cardiovascular: RRR, no MRG Abdomen: Soft, non-distended, generalized tenderness Extremities: No acute deformity Neuro: Somnolent. Arouses to verbal stimuli. More robust arousal to tactile stimuli. Does follows commands. Oriented to person and place. Year 1962.  Resolved Hospital Problem list     Assessment & Plan:   Seizure: no history. Fever on arrival raising concern for CNS infection. CSF not consistent with bacterial meningitis.  UDS positive for opiates and benzodiazepines, both of which are part of her home medication list.  - Admit to ICU for close monitoring - EEG - MRI brain - Neurology consult - Continue Keppra, PRN ativan for breakthrough seizure - Meningitis coverage - May start acyclovir based on MRI in the setting of renal failure.  - lab unable to run encephalitis panel, consider repeat LP - Follow cultures  UTI: UA not fully diagnostic, but she has evidence of perinephritis stranding and gas in the urinary bladder raising concern.  - Antibiotics as above -  Follow cultures  CAP vs ATX - RVP - Strep and legionella urinary antigens - ABX as above.   AKI: creatinine 4 on presentation with a recent baseline just over 1.   Rhabdomyolysis: CK 7680 on presentation - Aggressive volume resuscitation. 3L crystalloid given in ED.  - Trend BMP - Hopefully can avoid RRT.   Metabolic acidosis: marginally elevated anion gap. Uremic, lactic - Repeat chemistry, lactic now after volume  DM  - Hold metformin, glipizide while NPO - CBG monitoring and SSI  CAD HTN - Holding home amlodipine, atorvastatin, hydralazine, isosorbide, metoprolol while NPO - PRN hydralazine, labetalol for now  Hypothyroid - If not taking orals by the morning we can start IV synthroid.   Chronic pain: - hold home gabapentin, oxycodone, xanax (scheduled so will need to restart when appropriate to prevent withdrawal)   Best Practice (right click and "Reselect all SmartList Selections" daily)   Diet/type: NPO DVT prophylaxis: not indicated thrombocytopenia GI prophylaxis: PPI Lines: N/A Foley:  N/A Code Status:  full code Last date of multidisciplinary goals of care discussion '[ ]'$   Labs   CBC: Recent Labs  Lab 04/15/22 1453  WBC 6.2  NEUTROABS 5.9  HGB 13.2  HCT 41.5  MCV 79.5*  PLT 84*    Basic Metabolic Panel: Recent Labs  Lab 04/15/22 1453  NA 133*  K 4.0  CL 98  CO2 20*  GLUCOSE 207*  BUN 96*  CREATININE 4.08*  CALCIUM 8.8*  MG 2.4   GFR: Estimated Creatinine Clearance: 12.6 mL/min (A) (by C-G formula based on SCr of 4.08 mg/dL (H)). Recent Labs  Lab 04/15/22 1453 04/15/22 1800  WBC 6.2  --   LATICACIDVEN 2.4* 5.3*    Liver Function Tests: Recent Labs  Lab 04/15/22 1453  AST 130*  ALT 57*  ALKPHOS 173*  BILITOT 1.0  PROT 7.7  ALBUMIN 3.5   No results for input(s): "LIPASE", "AMYLASE" in the last 168 hours. Recent Labs  Lab 04/15/22 1800  AMMONIA 18    ABG    Component Value Date/Time   HCO3 17.5 (L) 04/15/2022 1800   TCO2 27 06/01/2020 2238   ACIDBASEDEF 9.1 (H) 04/15/2022 1800   O2SAT 34.4 04/15/2022 1800     Coagulation Profile: No results for input(s): "INR", "PROTIME" in  the last 168 hours.  Cardiac Enzymes: Recent Labs  Lab 04/15/22 1453  CKTOTAL 7,680*    HbA1C: Hgb A1c MFr Bld  Date/Time Value Ref Range Status  11/11/2021 03:45 AM 5.2 4.8 - 5.6 % Final    Comment:    (NOTE) Pre diabetes:          5.7%-6.4%  Diabetes:              >6.4%  Glycemic control for   <7.0% adults with diabetes   12/20/2019 12:01 PM 6.4 (H) 4.8 - 5.6 % Final    Comment:    (NOTE) Pre diabetes:          5.7%-6.4%  Diabetes:              >6.4%  Glycemic control for   <7.0% adults with diabetes     CBG: Recent Labs  Lab 04/15/22 [place]*    Review of Systems:   Patient is encephalopathic and/or intubated. Therefore history has been obtained from chart review.    Past Medical History:  She,  has a past medical history of Anemia, Anginal pain ([place]), Anxiety, ASD (atrial septal defect) (1989), Chronic abdominal  pain, Chronic nausea, Chronic neck pain, Complication of anesthesia, Constipation, Coronary artery disease, Depression, Diabetes mellitus, Dyspnea, Heart murmur, History of cardiac catheterization (02/15/11 Dr. Shelva Majestic), History of kidney stones, Hyperlipidemia, Hypertension, Incomplete RBBB, Migraine headache, Nerve damage, Nonalcoholic fatty liver disease (09/30/2012), Ovarian cyst, Pain management, Peripheral neuropathy, Pneumonia, Stroke ([place]), SVD (spontaneous vaginal delivery), and Urinary tract infection.   Surgical History:   Past Surgical History:  Procedure Laterality Date   ABDOMINAL HYSTERECTOMY  1993   ABDOMINAL SURGERY  07/2012   ASD [place]   BIOPSY  05/01/2021   Procedure: BIOPSY;  Surgeon: Harvel Quale, MD;  Location: [facility];  Service: Gastroenterology;;  gastric and anal    CARDIAC CATHETERIZATION  02/15/2011   No intervention. Recommend medical therapy.   CARDIAC CATHETERIZATION  02/14/2011   EF 55-60%, moderate concentric hypertrophy, mild mitral valve regurg   CARDIOVASCULAR STRESS TEST   09/29/2012   Small area of anterior apical reversible ischemia.   COLONOSCOPY  05/05/2012   Procedure: COLONOSCOPY;  Surgeon: Rogene Houston, MD;  Location: [facility];  Service: Endoscopy;  Laterality: N/A;  830   COLONOSCOPY WITH PROPOFOL N/A 07/02/2013   Procedure: EXAM ABANDONED DUE TO PREP--UNABLE TO PERFORM COLONOSCOPY ;  Surgeon: Rogene Houston, MD;  Location: [facility] ORS;  Service: Endoscopy;  Laterality: N/A;   COLONOSCOPY WITH PROPOFOL N/A 08/13/2013   Procedure: COLONOSCOPY WITH PROPOFOL;  Surgeon: Rogene Houston, MD;  Location: [facility] ORS;  Service: Endoscopy;  Laterality: N/A;  in cecum at 0756 ; total withdrawal time 15 minutes   CRANIECTOMY SUBOCCIPITAL FOR EXPLORATION / DECOMPRESSION CRANIAL NERVES  1999   CYSTOSCOPY/URETEROSCOPY/HOLMIUM LASER/STENT PLACEMENT Left 12/26/2019   Procedure: CYSTOSCOPY DIAGNOSTIC LEFT URETEROSCOPY/STENT PLACEMENT/RETROGRADE;  Surgeon: Lucas Mallow, MD;  Location: [facility];  Service: Urology;  Laterality: Left;   ESOPHAGOGASTRODUODENOSCOPY (EGD) WITH PROPOFOL N/A 05/01/2021   Procedure: ESOPHAGOGASTRODUODENOSCOPY (EGD) WITH PROPOFOL;  Surgeon: Harvel Quale, MD;  Location: [facility];  Service: Gastroenterology;  Laterality: N/A;   FLEXIBLE SIGMOIDOSCOPY  05/01/2021   Procedure: FLEXIBLE SIGMOIDOSCOPY;  Surgeon: Harvel Quale, MD;  Location: [facility];  Service: Gastroenterology;;   LAPAROSCOPY  07/03/2012   Procedure: LAPAROSCOPY OPERATIVE;  Surgeon: Margarette Asal, MD;  Location: [facility];  Service: Gynecology;  Laterality: N/A;   LEG SURGERY Right 2005   abscess that developed from injections (pain meds)   NECK SURGERY     SALPINGOOPHORECTOMY  07/03/2012   Procedure: SALPINGO OOPHORECTOMY;  Surgeon: Margarette Asal, MD;  Location: [facility];  Service: Gynecology;  Laterality: Bilateral;   WISDOM TOOTH EXTRACTION     x 1     Social History:   reports that she quit smoking about 9 years ago. Her smoking use included  cigarettes. She has a 5.00 pack-year smoking history. She has never used smokeless tobacco. She reports that she does not drink alcohol and does not use drugs.   Family History:  Her family history includes Cancer in her maternal grandfather; Diabetes in her father and mother; Hypertension in her father, maternal grandfather, and mother; Stroke in her paternal grandmother. There is no history of Other.   Allergies Allergies  Allergen Reactions   Ramipril     Possible ACE-I induced angioedema    Imitrex [Sumatriptan] Swelling    States she had swelling in neck   Lyrica [Pregabalin] Other (See Comments)    Severe Leg and feet swelling   Penicillins Rash     Home  Medications  Prior to Admission medications   Medication Sig Start Date End Date Taking? Authorizing Provider  Oxycodone HCl 10 MG TABS Take 10 mg by mouth every 6 (six) hours as needed. 04/07/22  Yes [provider]  acetaminophen (TYLENOL) 325 MG tablet Take 2 tablets (650 mg total) by mouth every 6 (six) hours as needed for mild pain, headache or fever (or Fever >/= 101). 11/13/21   Roxan Hockey, MD  ALPRAZolam Duanne Moron) 1 MG tablet Take 0.5 mg by mouth in the morning, at noon, in the evening, and at bedtime.    [provider]  amLODipine (NORVASC) 10 MG tablet Take 1 tablet (10 mg total) by mouth daily. 11/14/21   Roxan Hockey, MD  ascorbic acid (VITAMIN C) 500 MG tablet Take 1 tablet (500 mg total) by mouth daily. 11/14/21   Roxan Hockey, MD  aspirin EC 81 MG tablet Take 1 tablet (81 mg total) by mouth daily with breakfast. 11/13/21 11/13/22  Roxan Hockey, MD  atorvastatin (LIPITOR) 20 MG tablet Take 20 mg by mouth daily. 1 Tablet Daily    [provider]  Cholecalciferol (VITAMIN D-3) 125 MCG (5000 UT) TABS Take 5,000 Units by mouth daily.    [provider]  EPINEPHrine 0.3 mg/0.3 mL IJ SOAJ injection Inject 0.3 mg into the muscle as needed for anaphylaxis. Inject as needed for  anaphlaxis    [provider]  gabapentin (NEURONTIN) 600 MG tablet Take 600 mg by mouth 3 (three) times daily. 10/27/21   [provider]  glipiZIDE (GLUCOTROL) 5 MG tablet Take 5 mg by mouth 3 (three) times daily. 06/29/19   [provider]  hydrALAZINE (APRESOLINE) 50 MG tablet Take 1 tablet (50 mg total) by mouth 2 (two) times daily. 11/13/21 11/13/22  Roxan Hockey, MD  isosorbide mononitrate (IMDUR) 30 MG 24 hr tablet Take 1 tablet (30 mg total) by mouth daily. 11/13/21 11/13/22  Roxan Hockey, MD  levothyroxine (SYNTHROID) 25 MCG tablet Take 25 mcg by mouth daily. 10/05/21   [provider]  metFORMIN (GLUCOPHAGE) 500 MG tablet Take 500 mg by mouth 2 (two) times daily with a meal.    [provider]  metoprolol tartrate (LOPRESSOR) 100 MG tablet Take 100 mg by mouth 2 (two) times a day. 06/15/18   [provider]  oxyCODONE (OXY IR/ROXICODONE) 5 MG immediate release tablet Take 15 mg by mouth every 6 (six) hours as needed (pain.). 08/29/19   [provider]  Potassium 99 MG TABS Take 99 mg by mouth daily.    [provider]  tiZANidine (ZANAFLEX) 4 MG tablet Take 1 tablet (4 mg total) by mouth at bedtime. 11/13/21   Roxan Hockey, MD  zinc sulfate 220 (50 Zn) MG capsule Take 1 capsule (220 mg total) by mouth daily. 11/14/21   Roxan Hockey, MD     Critical care time: 70 minutes      Georgann Housekeeper, AGACNP-BC  Pulmonary & Critical Care  See Amion for personal pager PCCM on call pager [phone] until 7pm. Please call Elink 7p-7a. [phone]  04/15/2022 8:29 PM

## 2022-04-15 NOTE — H&P (Incomplete)
NAME:  Anita [name], MRN:  [id number], DOB:  28-May-1961, LOS: 0 ADMISSION DATE:  04/15/2022, CONSULTATION DATE:  11/16 REFERRING MD:  Dr. Gilford Raid EDP, CHIEF COMPLAINT:  Seizure   History of Present Illness:  61 year old female with past medical history as below, which is significant for diabetes, atrial septal defect, coronary artery disease, hypertension, stroke, and peripheral neuropathy.  Admitted in June of this year with acute metabolic encephalopathy felt to be secondary to either hypertensive crisis versus hypoglycemia.  Improved with treatment of the aforementioned conditions.  Patient now presented to [facility] emergency department on 11/16 with complaints of seizure.  She does not have a seizure history, however, she was noted to have jerking movements at home and was incontinent of urine.  She was administered 2 mg of Versed by EMS with improvement of seizure.  She was postictal and minimally responsive upon arrival to the emergency department but mental status did improve.  Fever noted in the ED with temperature up to 103.7 Fahrenheit.  Lumbar puncture was performed in the emergency department.  Neurology at [facility] was consulted and recommended transfer to a [place] for neurology evaluation.  PCCM has been asked to admit.  Pertinent  Medical History   has a past medical history of Anemia, Anginal pain ([place]), Anxiety, ASD (atrial septal defect) (1989), Chronic abdominal pain, Chronic nausea, Chronic neck pain, Complication of anesthesia, Constipation, Coronary artery disease, Depression, Diabetes mellitus, Dyspnea, Heart murmur, History of cardiac catheterization (02/15/11 Dr. Shelva Majestic), History of kidney stones, Hyperlipidemia, Hypertension, Incomplete RBBB, Migraine headache, Nerve damage, Nonalcoholic fatty liver disease (09/30/2012), Ovarian cyst, Pain management, Peripheral neuropathy, Pneumonia, Stroke ([place]), SVD (spontaneous vaginal delivery), and Urinary tract  infection.,hp  Significant Hospital Events: Including procedures, antibiotic start and stop dates in addition to other pertinent events   11/16 present to [facility] with sz. Tx to [facility] for neuro eval.   Interim History / Subjective:    Objective   Blood pressure (!) 135/55, pulse (!) 102, temperature (!) 100.4 F (38 C), temperature source Axillary, resp. rate 14, height '5\' 5"'$  (1.651 m), weight 55 kg, SpO2 100 %.        Intake/Output Summary (Last 24 hours) at 04/15/2022 1957 Last data filed at 04/15/2022 1817 Gross per 24 hour  Intake 2298 ml  Output 13 ml  Net 2285 ml   Filed Weights   04/15/22 1449  Weight: 55 kg    Examination: General: *** HENT: *** Lungs: *** Cardiovascular: *** Abdomen: *** Extremities: *** Neuro: *** GU: ***  Resolved Hospital Problem list     Assessment & Plan:   Seizure: no history. Fever on arrival raising concern for CNS infection. CSF not consistent with bacterial meningitis.  UDS postitive for opiates and benzodiazepines, both of which are part of her home medication list.  - Admit to ICU for close monitoring - EEG - Neurology consult - Continue Keppra, PRN ativan for breakthrough seizure - Meningitis coverage - Attempting to have lab add on encephalitis panel. Small volume of CSF remains at [facility] lab. [facility] lab arranging. Waiting for call bak.  - Follow cultures  UTI: UA not fully diagnostic, but she has evidence of perinephritis stranding and gas in the urinary bladder raising concern.  - Antibiotics as above - Follow cultures  AKI: creatinine 4 on presentation with a recent baseline just over 1.  Rhabdomyolysis: CK 7680 on presentation - Aggressive volume resuscitation. 3L crystalloid given in ED.  - Trend BMP - Hopefully  can avoid RRT.   Metabolic acidosis: marginally elevated anion gap. Uremic, lactic - Repeat chemistry, lactic now after volume  DM  - Hold metformin, glipizide while NPO - CBG monitoring and  SSI  CAD HTN - Holding home amlodipine, atorvastatin, hydralazine, isosorbide, metoprolol while NPO - PRN hydralazine, labetalol for now  Hypothyroid - If not taking orals by the morning we can start IV synthroid.   Chronic pain: - hold home gabapentin, oxycodone, xanax (scheduled so will need to restart/prevent withdrawal) ?? Stopped taking ***    Best Practice (right click and "Reselect all SmartList Selections" daily)   Diet/type: NPO DVT prophylaxis: prophylactic heparin  GI prophylaxis: H2B Lines: N/A Foley:  N/A Code Status:  full code Last date of multidisciplinary goals of care discussion '[ ]'$   Labs   CBC: Recent Labs  Lab 04/15/22 1453  WBC 6.2  NEUTROABS 5.9  HGB 13.2  HCT 41.5  MCV 79.5*  PLT 84*    Basic Metabolic Panel: Recent Labs  Lab 04/15/22 1453  NA 133*  K 4.0  CL 98  CO2 20*  GLUCOSE 207*  BUN 96*  CREATININE 4.08*  CALCIUM 8.8*  MG 2.4   GFR: Estimated Creatinine Clearance: 12.6 mL/min (A) (by C-G formula based on SCr of 4.08 mg/dL (H)). Recent Labs  Lab 04/15/22 1453 04/15/22 1800  WBC 6.2  --   LATICACIDVEN 2.4* 5.3*    Liver Function Tests: Recent Labs  Lab 04/15/22 1453  AST 130*  ALT 57*  ALKPHOS 173*  BILITOT 1.0  PROT 7.7  ALBUMIN 3.5   No results for input(s): "LIPASE", "AMYLASE" in the last 168 hours. Recent Labs  Lab 04/15/22 1800  AMMONIA 18    ABG    Component Value Date/Time   HCO3 17.5 (L) 04/15/2022 1800   TCO2 27 06/01/2020 2238   ACIDBASEDEF 9.1 (H) 04/15/2022 1800   O2SAT 34.4 04/15/2022 1800     Coagulation Profile: No results for input(s): "INR", "PROTIME" in the last 168 hours.  Cardiac Enzymes: Recent Labs  Lab 04/15/22 1453  CKTOTAL 7,680*    HbA1C: Hgb A1c MFr Bld  Date/Time Value Ref Range Status  11/11/2021 03:45 AM 5.2 4.8 - 5.6 % Final    Comment:    (NOTE) Pre diabetes:          5.7%-6.4%  Diabetes:              >6.4%  Glycemic control for   <7.0% adults with  diabetes   12/20/2019 12:01 PM 6.4 (H) 4.8 - 5.6 % Final    Comment:    (NOTE) Pre diabetes:          5.7%-6.4%  Diabetes:              >6.4%  Glycemic control for   <7.0% adults with diabetes     CBG: Recent Labs  Lab 04/15/22 1744  GLUCAP 223*    Review of Systems:   ***  Past Medical History:  She,  has a past medical history of Anemia, Anginal pain ([place]), Anxiety, ASD (atrial septal defect) (1989), Chronic abdominal pain, Chronic nausea, Chronic neck pain, Complication of anesthesia, Constipation, Coronary artery disease, Depression, Diabetes mellitus, Dyspnea, Heart murmur, History of cardiac catheterization (02/15/11 Dr. Shelva Majestic), History of kidney stones, Hyperlipidemia, Hypertension, Incomplete RBBB, Migraine headache, Nerve damage, Nonalcoholic fatty liver disease (09/30/2012), Ovarian cyst, Pain management, Peripheral neuropathy, Pneumonia, Stroke ([place]), SVD (spontaneous vaginal delivery), and Urinary tract infection.   Surgical History:  Past Surgical History:  Procedure Laterality Date  . ABDOMINAL HYSTERECTOMY  1993  . ABDOMINAL SURGERY  07/2012  . ASD REPAIR  1989  . BIOPSY  05/01/2021   Procedure: BIOPSY;  Surgeon: Harvel Quale, MD;  Location: [facility];  Service: Gastroenterology;;  gastric and anal   . CARDIAC CATHETERIZATION  02/15/2011   No intervention. Recommend medical therapy.  Marland Kitchen CARDIAC CATHETERIZATION  02/14/2011   EF 55-60%, moderate concentric hypertrophy, mild mitral valve regurg  . CARDIOVASCULAR STRESS TEST  09/29/2012   Small area of anterior apical reversible ischemia.  . COLONOSCOPY  05/05/2012   Procedure: COLONOSCOPY;  Surgeon: Rogene Houston, MD;  Location: [facility] END[facility];  Service: Endoscopy;  Laterality: N/A;  830  . COLONOSCOPY WITH PROPOFOL N/A 07/02/2013   Procedure: EXAM ABANDONED DUE TO PREP--UNABLE TO PERFORM COLONOSCOPY ;  Surgeon: Rogene Houston, MD;  Location: [facility] ORS;  Service: Endoscopy;  Laterality: N/A;  .  COLONOSCOPY WITH PROPOFOL N/A 08/13/2013   Procedure: COLONOSCOPY WITH PROPOFOL;  Surgeon: Rogene Houston, MD;  Location: [facility] ORS;  Service: Endoscopy;  Laterality: N/A;  in cecum at 0756 ; total withdrawal time 15 minutes  . 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 CYSTOSCOPY/URETEROSCOPY/HOLMIUM LASER/STENT PLACEMENT Left 12/26/2019   Procedure: CYSTOSCOPY DIAGNOSTIC LEFT URETEROSCOPY/STENT PLACEMENT/RETROGRADE;  Surgeon: Lucas Mallow, MD;  Location: [facility] ORS;  Service: Urology;  Laterality: Left;  . ESOPHAGOGASTRODUODENOSCOPY (EGD) WITH PROPOFOL N/A 05/01/2021   Procedure: ESOPHAGOGASTRODUODENOSCOPY (EGD) WITH PROPOFOL;  Surgeon: Harvel Quale, MD;  Location: [facility];  Service: Gastroenterology;  Laterality: N/A;  . FLEXIBLE SIGMOIDOSCOPY  05/01/2021   Procedure: FLEXIBLE SIGMOIDOSCOPY;  Surgeon: Montez Morita, Quillian Quince, MD;  Location: [facility];  Service: Gastroenterology;;  . LAPAROSCOPY  07/03/2012   Procedure: LAPAROSCOPY OPERATIVE;  Surgeon: Margarette Asal, MD;  Location: [place] ORS;  Service: Gynecology;  Laterality: N/A;  . LEG SURGERY Right 2005   abscess that developed from injections (pain meds)  . NECK SURGERY    . SALPINGOOPHORECTOMY  07/03/2012   Procedure: SALPINGO OOPHORECTOMY;  Surgeon: Margarette Asal, MD;  Location: [facility];  Service: Gynecology;  Laterality: Bilateral;  . WISDOM TOOTH EXTRACTION     x 1     Social History:   reports that she quit smoking about 9 years ago. Her smoking use included cigarettes. She has a 5.00 pack-year smoking history. She has never used smokeless tobacco. She reports that she does not drink alcohol and does not use drugs.   Family History:  Her family history includes Cancer in her maternal grandfather; Diabetes in her father and mother; Hypertension in her father, maternal grandfather, and mother; Stroke in her paternal grandmother. There is no history of Other.    Allergies Allergies  Allergen Reactions  . Ramipril     Possible ACE-I induced angioedema   . Imitrex [Sumatriptan] Swelling    States she had swelling in neck  . Lyrica [Pregabalin] Other (See Comments)    Severe Leg and feet swelling  . Penicillins Rash     Home Medications  Prior to Admission medications   Medication Sig Start Date End Date Taking? Authorizing Provider  Oxycodone HCl 10 MG TABS Take 10 mg by mouth every 6 (six) hours as needed. 04/07/22  Yes [provider]  acetaminophen (TYLENOL) 325 MG tablet Take 2 tablets (650 mg total) by mouth every 6 (six) hours as needed for mild pain, headache or fever (or  Fever >/= 101). 11/13/21   Roxan Hockey, MD  ALPRAZolam Duanne Moron) 1 MG tablet Take 0.5 mg by mouth in the morning, at noon, in the evening, and at bedtime.    [provider]  amLODipine (NORVASC) 10 MG tablet Take 1 tablet (10 mg total) by mouth daily. 11/14/21   Roxan Hockey, MD  ascorbic acid (VITAMIN C) 500 MG tablet Take 1 tablet (500 mg total) by mouth daily. 11/14/21   Roxan Hockey, MD  aspirin EC 81 MG tablet Take 1 tablet (81 mg total) by mouth daily with breakfast. 11/13/21 11/13/22  Roxan Hockey, MD  atorvastatin (LIPITOR) 20 MG tablet Take 20 mg by mouth daily. 1 Tablet Daily    [provider]  Cholecalciferol (VITAMIN D-3) 125 MCG (5000 UT) TABS Take 5,000 Units by mouth daily.    [provider]  EPINEPHrine 0.3 mg/0.3 mL IJ SOAJ injection Inject 0.3 mg into the muscle as needed for anaphylaxis. Inject as needed for anaphlaxis    [provider]  gabapentin (NEURONTIN) 600 MG tablet Take 600 mg by mouth 3 (three) times daily. 10/27/21   [provider]  glipiZIDE (GLUCOTROL) 5 MG tablet Take 5 mg by mouth 3 (three) times daily. 06/29/19   [provider]  hydrALAZINE (APRESOLINE) 50 MG tablet Take 1 tablet (50 mg total) by mouth 2 (two) times daily. 11/13/21 11/13/22  Roxan Hockey, MD   isosorbide mononitrate (IMDUR) 30 MG 24 hr tablet Take 1 tablet (30 mg total) by mouth daily. 11/13/21 11/13/22  Roxan Hockey, MD  levothyroxine (SYNTHROID) 25 MCG tablet Take 25 mcg by mouth daily. 10/05/21   [provider]  metFORMIN (GLUCOPHAGE) 500 MG tablet Take 500 mg by mouth 2 (two) times daily with a meal.    [provider]  metoprolol tartrate (LOPRESSOR) 100 MG tablet Take 100 mg by mouth 2 (two) times a day. 06/15/18   [provider]  oxyCODONE (OXY IR/ROXICODONE) 5 MG immediate release tablet Take 15 mg by mouth every 6 (six) hours as needed (pain.). 08/29/19   [provider]  Potassium 99 MG TABS Take 99 mg by mouth daily.    [provider]  tiZANidine (ZANAFLEX) 4 MG tablet Take 1 tablet (4 mg total) by mouth at bedtime. 11/13/21   Roxan Hockey, MD  zinc sulfate 220 (50 Zn) MG capsule Take 1 capsule (220 mg total) by mouth daily. 11/14/21   Roxan Hockey, MD     Critical care time: ***      Georgann Housekeeper, AGACNP-BC [facility]  See Amion for personal pager PCCM on call pager [phone] until 7pm. Please call Elink 7p-7a. [phone]  04/15/2022 8:29 PM

## 2022-04-15 NOTE — Sepsis Progress Note (Addendum)
Elink monitoring for the code sepsis protocol.   Notified provider of need to order repeat lactic acid. 2nd Lactic is trending up so needs a 3rd.

## 2022-04-16 ENCOUNTER — Inpatient Hospital Stay (HOSPITAL_COMMUNITY): Payer: Medicare Other

## 2022-04-16 DIAGNOSIS — N179 Acute kidney failure, unspecified: Secondary | ICD-10-CM | POA: Diagnosis not present

## 2022-04-16 DIAGNOSIS — R569 Unspecified convulsions: Secondary | ICD-10-CM

## 2022-04-16 DIAGNOSIS — G9341 Metabolic encephalopathy: Secondary | ICD-10-CM | POA: Diagnosis not present

## 2022-04-16 DIAGNOSIS — N3000 Acute cystitis without hematuria: Secondary | ICD-10-CM | POA: Diagnosis not present

## 2022-04-16 DIAGNOSIS — R4182 Altered mental status, unspecified: Secondary | ICD-10-CM | POA: Diagnosis not present

## 2022-04-16 DIAGNOSIS — M6282 Rhabdomyolysis: Secondary | ICD-10-CM | POA: Diagnosis not present

## 2022-04-16 LAB — COMPREHENSIVE METABOLIC PANEL
ALT: 46 U/L — ABNORMAL HIGH (ref 0–44)
AST: 122 U/L — ABNORMAL HIGH (ref 15–41)
Albumin: 2.2 g/dL — ABNORMAL LOW (ref 3.5–5.0)
Alkaline Phosphatase: 56 U/L (ref 38–126)
Anion gap: 14 (ref 5–15)
BUN: 81 mg/dL — ABNORMAL HIGH (ref 8–23)
CO2: 19 mmol/L — ABNORMAL LOW (ref 22–32)
Calcium: 7.8 mg/dL — ABNORMAL LOW (ref 8.9–10.3)
Chloride: 103 mmol/L (ref 98–111)
Creatinine, Ser: 3.52 mg/dL — ABNORMAL HIGH (ref 0.44–1.00)
GFR, Estimated: 14 mL/min — ABNORMAL LOW (ref >60)
Glucose, Bld: 192 mg/dL — ABNORMAL HIGH (ref 70–99)
Potassium: 3.9 mmol/L (ref 3.5–5.1)
Sodium: 136 mmol/L (ref 135–145)
Total Bilirubin: 1 mg/dL (ref 0.3–1.2)
Total Protein: 5.3 g/dL — ABNORMAL LOW (ref 6.5–8.1)

## 2022-04-16 LAB — CBC
HCT: 29.6 % — ABNORMAL LOW (ref 36.0–46.0)
HCT: 26.6 % — ABNORMAL LOW (ref 36.0–46.0)
Hemoglobin: 9.6 g/dL — ABNORMAL LOW (ref 12.0–15.0)
Hemoglobin: 8.7 g/dL — ABNORMAL LOW (ref 12.0–15.0)
MCH: 25.9 pg — ABNORMAL LOW (ref 26.0–34.0)
MCH: 26.5 pg (ref 26.0–34.0)
MCHC: 32.4 g/dL (ref 30.0–36.0)
MCHC: 32.7 g/dL (ref 30.0–36.0)
MCV: 79.8 fL — ABNORMAL LOW (ref 80.0–100.0)
MCV: 81.1 fL (ref 80.0–100.0)
Platelets: 72 10*3/uL — ABNORMAL LOW (ref 150–400)
Platelets: 70 10*3/uL — ABNORMAL LOW (ref 150–400)
RBC: 3.71 MIL/uL — ABNORMAL LOW (ref 3.87–5.11)
RBC: 3.28 MIL/uL — ABNORMAL LOW (ref 3.87–5.11)
RDW: 16.5 % — ABNORMAL HIGH (ref 11.5–15.5)
RDW: 16.5 % — ABNORMAL HIGH (ref 11.5–15.5)
WBC: 22.1 10*3/uL — ABNORMAL HIGH (ref 4.0–10.5)
WBC: 20.4 10*3/uL — ABNORMAL HIGH (ref 4.0–10.5)
nRBC: 0 % (ref 0.0–0.2)
nRBC: 0 % (ref 0.0–0.2)

## 2022-04-16 LAB — HIV ANTIBODY (ROUTINE TESTING W REFLEX): HIV Screen 4th Generation wRfx: NONREACTIVE

## 2022-04-16 LAB — MAGNESIUM: Magnesium: 2.1 mg/dL (ref 1.7–2.4)

## 2022-04-16 LAB — BLOOD CULTURE ID PANEL (REFLEXED) - BCID2

## 2022-04-16 LAB — GLUCOSE, CAPILLARY
Glucose-Capillary: 201 mg/dL — ABNORMAL HIGH (ref 70–99)
Glucose-Capillary: 254 mg/dL — ABNORMAL HIGH (ref 70–99)
Glucose-Capillary: 147 mg/dL — ABNORMAL HIGH (ref 70–99)
Glucose-Capillary: 238 mg/dL — ABNORMAL HIGH (ref 70–99)

## 2022-04-16 LAB — BASIC METABOLIC PANEL
Anion gap: 15 (ref 5–15)
BUN: 77 mg/dL — ABNORMAL HIGH (ref 8–23)
CO2: 20 mmol/L — ABNORMAL LOW (ref 22–32)
Calcium: 8 mg/dL — ABNORMAL LOW (ref 8.9–10.3)
Chloride: 103 mmol/L (ref 98–111)
Creatinine, Ser: 3.3 mg/dL — ABNORMAL HIGH (ref 0.44–1.00)
GFR, Estimated: 15 mL/min — ABNORMAL LOW (ref >60)
Glucose, Bld: 223 mg/dL — ABNORMAL HIGH (ref 70–99)
Potassium: 3.9 mmol/L (ref 3.5–5.1)
Sodium: 138 mmol/L (ref 135–145)

## 2022-04-16 LAB — STREP PNEUMONIAE URINARY ANTIGEN: Strep Pneumo Urinary Antigen: NEGATIVE

## 2022-04-16 LAB — RESPIRATORY PANEL BY PCR

## 2022-04-16 LAB — TSH: TSH: 0.168 u[IU]/mL — ABNORMAL LOW (ref 0.350–4.500)

## 2022-04-16 LAB — CORTISOL: Cortisol, Plasma: 50.3 ug/dL

## 2022-04-16 LAB — LACTIC ACID, PLASMA
Lactic Acid, Venous: 1.3 mmol/L (ref 0.5–1.9)
Lactic Acid, Venous: 1.3 mmol/L (ref 0.5–1.9)
Lactic Acid, Venous: 1.4 mmol/L (ref 0.5–1.9)

## 2022-04-16 LAB — PHOSPHORUS: Phosphorus: 4.8 mg/dL — ABNORMAL HIGH (ref 2.5–4.6)

## 2022-04-16 LAB — VITAMIN B12: Vitamin B-12: 205 pg/mL (ref 180–914)

## 2022-04-16 LAB — T4, FREE: Free T4: 1.28 ng/dL — ABNORMAL HIGH (ref 0.61–1.12)

## 2022-04-16 LAB — PROCALCITONIN: Procalcitonin: 68.55 ng/mL

## 2022-04-16 LAB — MRSA NEXT GEN BY PCR, NASAL: MRSA by PCR Next Gen: NOT DETECTED

## 2022-04-16 MED ORDER — LACTATED RINGERS IV SOLN: INTRAVENOUS | Status: DC

## 2022-04-16 MED ORDER — ACETAMINOPHEN 325 MG PO TABS
650.0000 mg | ORAL_TABLET | Freq: Once | ORAL | Status: AC
  Administered 2022-04-16: 650 mg via ORAL
  Filled 2022-04-16: qty 2

## 2022-04-16 MED ORDER — ATORVASTATIN CALCIUM 10 MG PO TABS
20.0000 mg | ORAL_TABLET | Freq: Every day | ORAL | Status: DC
  Administered 2022-04-16 – 2022-04-20 (×5): 20 mg via ORAL
  Filled 2022-04-16 (×5): qty 2

## 2022-04-16 MED ORDER — BETHANECHOL CHLORIDE 10 MG PO TABS
10.0000 mg | ORAL_TABLET | Freq: Three times a day (TID) | ORAL | Status: AC
  Administered 2022-04-16 – 2022-04-19 (×9): 10 mg via ORAL
  Filled 2022-04-16 (×11): qty 1

## 2022-04-16 MED ORDER — SODIUM CHLORIDE 0.9 % IV SOLN: 2.0000 g | INTRAVENOUS | Status: DC

## 2022-04-16 MED ORDER — THIAMINE HCL 100 MG/ML IJ SOLN: 100.0000 mg | INTRAMUSCULAR | Status: DC

## 2022-04-16 MED ORDER — AMLODIPINE BESYLATE 10 MG PO TABS
10.0000 mg | ORAL_TABLET | Freq: Every day | ORAL | Status: DC
  Administered 2022-04-16 – 2022-04-20 (×5): 10 mg via ORAL
  Filled 2022-04-16 (×5): qty 1

## 2022-04-16 MED ORDER — POLYETHYLENE GLYCOL 3350 17 G PO PACK
17.0000 g | PACK | Freq: Two times a day (BID) | ORAL | Status: DC
  Administered 2022-04-16 – 2022-04-18 (×6): 17 g via ORAL
  Filled 2022-04-16 (×9): qty 1

## 2022-04-16 MED ORDER — SODIUM CHLORIDE 0.9 % IV SOLN
2.0000 g | INTRAVENOUS | Status: DC
  Administered 2022-04-17 – 2022-04-19 (×3): 2 g via INTRAVENOUS
  Filled 2022-04-16 (×3): qty 20

## 2022-04-16 MED ORDER — SENNOSIDES-DOCUSATE SODIUM 8.6-50 MG PO TABS
1.0000 | ORAL_TABLET | Freq: Two times a day (BID) | ORAL | Status: DC
  Administered 2022-04-16 – 2022-04-20 (×9): 1 via ORAL
  Filled 2022-04-16 (×9): qty 1

## 2022-04-16 MED ORDER — PANTOPRAZOLE SODIUM 40 MG IV SOLR: 40.0000 mg | INTRAVENOUS | Status: DC

## 2022-04-16 MED ORDER — ASPIRIN 81 MG PO TBEC
81.0000 mg | DELAYED_RELEASE_TABLET | Freq: Every day | ORAL | Status: DC
  Administered 2022-04-16 – 2022-04-20 (×5): 81 mg via ORAL
  Filled 2022-04-16 (×5): qty 1

## 2022-04-16 MED ORDER — INSULIN ASPART 100 UNIT/ML IJ SOLN
0.0000 [IU] | Freq: Three times a day (TID) | INTRAMUSCULAR | Status: DC
  Administered 2022-04-16: 1 [IU] via SUBCUTANEOUS
  Administered 2022-04-16: 5 [IU] via SUBCUTANEOUS
  Administered 2022-04-17: 2 [IU] via SUBCUTANEOUS
  Administered 2022-04-17: 1 [IU] via SUBCUTANEOUS
  Administered 2022-04-17: 2 [IU] via SUBCUTANEOUS
  Administered 2022-04-18: 3 [IU] via SUBCUTANEOUS
  Administered 2022-04-18: 1 [IU] via SUBCUTANEOUS
  Administered 2022-04-18: 2 [IU] via SUBCUTANEOUS
  Administered 2022-04-19: 7 [IU] via SUBCUTANEOUS
  Administered 2022-04-19: 3 [IU] via SUBCUTANEOUS
  Administered 2022-04-19: 2 [IU] via SUBCUTANEOUS
  Administered 2022-04-20: 3 [IU] via SUBCUTANEOUS
  Administered 2022-04-20: 2 [IU] via SUBCUTANEOUS

## 2022-04-16 MED ORDER — SODIUM CHLORIDE 0.9 % IV SOLN
INTRAVENOUS | Status: DC | PRN
  Administered 2022-04-16: 250 mL via INTRAVENOUS

## 2022-04-16 MED ORDER — HEPARIN SODIUM (PORCINE) 5000 UNIT/ML IJ SOLN
5000.0000 [IU] | Freq: Three times a day (TID) | INTRAMUSCULAR | Status: DC
  Administered 2022-04-16 – 2022-04-20 (×13): 5000 [IU] via SUBCUTANEOUS
  Filled 2022-04-16 (×13): qty 1

## 2022-04-16 MED ORDER — CYANOCOBALAMIN 1000 MCG/ML IJ SOLN
1000.0000 ug | Freq: Once | INTRAMUSCULAR | Status: AC
  Administered 2022-04-16: 1000 ug via INTRAMUSCULAR
  Filled 2022-04-16: qty 1

## 2022-04-16 MED ORDER — POLYETHYLENE GLYCOL 3350 17 G PO PACK: 17.0000 g | PACK | Freq: Every day | ORAL | Status: DC | PRN

## 2022-04-16 MED ORDER — SODIUM CHLORIDE 0.9 % IV SOLN
2.0000 g | Freq: Two times a day (BID) | INTRAVENOUS | Status: DC
  Administered 2022-04-16: 2 g via INTRAVENOUS
  Filled 2022-04-16: qty 20

## 2022-04-16 MED ORDER — SODIUM CHLORIDE 0.9% FLUSH
3.0000 mL | Freq: Two times a day (BID) | INTRAVENOUS | Status: DC
  Administered 2022-04-16 – 2022-04-20 (×5): 3 mL via INTRAVENOUS

## 2022-04-16 MED ORDER — DOCUSATE SODIUM 100 MG PO CAPS: 100.0000 mg | ORAL_CAPSULE | Freq: Two times a day (BID) | ORAL | Status: DC | PRN

## 2022-04-16 MED ORDER — ISOSORBIDE MONONITRATE ER 30 MG PO TB24
30.0000 mg | ORAL_TABLET | Freq: Every day | ORAL | Status: DC
  Administered 2022-04-16 – 2022-04-20 (×5): 30 mg via ORAL
  Filled 2022-04-16 (×5): qty 1

## 2022-04-16 MED ORDER — HEPARIN SODIUM (PORCINE) 5000 UNIT/ML IJ SOLN: 5000.0000 [IU] | Freq: Three times a day (TID) | INTRAMUSCULAR | Status: DC

## 2022-04-16 MED ORDER — DEXTROSE IN LACTATED RINGERS 5 % IV SOLN: INTRAVENOUS | Status: DC

## 2022-04-16 MED ORDER — SODIUM CHLORIDE 0.9 % IV SOLN: INTRAVENOUS | Status: DC | PRN

## 2022-04-16 MED ORDER — METOPROLOL TARTRATE 100 MG PO TABS
100.0000 mg | ORAL_TABLET | Freq: Two times a day (BID) | ORAL | Status: DC
  Administered 2022-04-16 – 2022-04-20 (×8): 100 mg via ORAL
  Filled 2022-04-16 (×4): qty 1
  Filled 2022-04-16: qty 2
  Filled 2022-04-16: qty 1
  Filled 2022-04-16: qty 2
  Filled 2022-04-16: qty 1

## 2022-04-16 MED ORDER — RINGERS IV SOLN: INTRAVENOUS | Status: DC

## 2022-04-16 MED ORDER — THIAMINE HCL 100 MG/ML IJ SOLN
500.0000 mg | Freq: Three times a day (TID) | INTRAVENOUS | Status: AC
  Administered 2022-04-16 – 2022-04-18 (×9): 500 mg via INTRAVENOUS
  Filled 2022-04-16 (×10): qty 5

## 2022-04-16 MED ORDER — LEVOTHYROXINE SODIUM 25 MCG PO TABS
25.0000 ug | ORAL_TABLET | Freq: Every day | ORAL | Status: DC
  Administered 2022-04-16 – 2022-04-20 (×5): 25 ug via ORAL
  Filled 2022-04-16 (×5): qty 1

## 2022-04-16 MED ORDER — FAMOTIDINE 20 MG PO TABS: 20.0000 mg | ORAL_TABLET | Freq: Two times a day (BID) | ORAL | Status: DC

## 2022-04-16 NOTE — TOC Progression Note (Signed)
Transition of Care [facility]) - Initial/Assessment Note    Patient Details  Name: Anita [name] MRN: [id number] Date of Birth: [birth year]  Transition of Care [facility]) CM/SW Contact:    Milinda Antis, [place] Phone Number: 04/16/2022, 10:43 AM  Clinical Narrative:                 Transition of Care Department [facility]) has reviewed patient.  We will continue to monitor patient advancement through interdisciplinary progression rounds.   If new patient transition needs arise, please place a TOC consult.    Patient Goals and CMS Choice        Expected Discharge Plan and Services                                                Prior Living Arrangements/Services                       Activities of Daily Living      Permission Sought/Granted                  Emotional Assessment              Admission diagnosis:  Acute cystitis without hematuria [N30.00] AKI (acute kidney injury) ([place]) [N17.9] Sacral decubitus ulcer, stage II ([place]) [L89.152] Non-traumatic rhabdomyolysis [K59.93] Acute metabolic encephalopathy [T70.17] Sepsis with acute renal failure without septic shock, due to unspecified organism, unspecified acute renal failure type ([place]) [A41.9, R65.20, N17.9] Seizure ([place]) [R56.9] Patient Active Problem List   Diagnosis Date Noted   Seizure ([place]) 04/16/2022   Non-traumatic rhabdomyolysis 04/16/2022   Malnutrition of moderate degree 11/11/2021   COVID-19 virus infection 11/11/2021   Hypertensive emergency 11/11/2021   Sepsis ([place]) 11/10/2021   UTI (urinary tract infection) 11/10/2021   AKI (acute kidney injury) ([place]) 79/39/0300   Acute metabolic encephalopathy 92/33/0076   Chronic bilateral low back pain with left-sided sciatica 04/27/2017   Chest pain 08/08/2014   Angioedema 07/09/2013   Acute anaphylaxis 07/09/2013   Abdominal distention 02/01/2013   Tobacco abuse 22/63/3354   Nonalcoholic fatty liver disease 09/30/2012   Constipation  03/29/2012   Abdominal pain: RUQ, resolved at discharge  03/29/2012   ASD (atrial septal defect) repair 1988 03/08/2012   CAD (coronary artery disease), 30% LAD 9/12 03/08/2012   HTN (hypertension) 03/08/2012   Diabetes mellitus with neuropathy ([place]) 03/08/2012   Dyslipidemia 03/08/2012   Abnormal EKG, incomplete RBBB 03/08/2012   Chronic pain due to trauma 03/08/2012   Unstable angina, negative MI, negative to mildly positive Nuc study Known 30% LAD disease, medical therapy 03/08/2012   PCP:  Redmond School, MD Pharmacy:   [facility], [place] [address] [place] [address] Phone: [phone] Fax: [phone]     Social Determinants of Health (SDOH) Interventions    Readmission Risk Interventions     No data to display

## 2022-04-16 NOTE — Progress Notes (Signed)
Noted HR 102 going up to 112 appeared irregular. EKG obtained. Pt noted A- fib, MD made aware.

## 2022-04-16 NOTE — Progress Notes (Signed)
LTM EEG discontinued - no skin breakdown at [facility].  [facility] Notified

## 2022-04-16 NOTE — Progress Notes (Signed)
eLink Physician-Brief Progress Note Patient Name: Anita [name] DOB: [birth year] MRN: [id number]   Date of Service  04/16/2022  HPI/Events of Note  Notified of rapid atrial fibrillation with rate in the 110s-130s.  Pt is on metoprolol at home but has been held.  BP 140s-160s, HR 110s-130s   eICU Interventions  Resume metoprolol '100mg'$  BID.      Intervention Category Intermediate Interventions: Arrhythmia - evaluation and management  Elsie Lincoln 04/16/2022, 7:45 PM

## 2022-04-16 NOTE — Progress Notes (Signed)
NAME:  Anita [name], MRN:  [id number], DOB:  1961/02/20, LOS: 1 ADMISSION DATE:  04/15/2022, CONSULTATION DATE:  11/16 REFERRING MD:  Dr. Gilford Raid EDP, CHIEF COMPLAINT:  Seizure   History of Present Illness:  61 year old female with past medical history as below, which is significant for diabetes, atrial septal defect, coronary artery disease, hypertension, stroke, and peripheral neuropathy.  Admitted in June of this year with acute metabolic encephalopathy felt to be secondary to either hypertensive crisis versus hypoglycemia.  Improved with treatment of the aforementioned conditions.  Patient now presented to [facility] emergency department on 11/16 with complaints of seizure.  She does not have a seizure history, however, she was noted to have jerking movements at home and was incontinent of urine.  She was administered 2 mg of Versed by EMS with improvement of seizure.  She was postictal and minimally responsive upon arrival to the emergency department but mental status did improve.  Fever noted in the ED with temperature up to 103.7 Fahrenheit.  Lumbar puncture was performed in the emergency department.  Neurology at [facility] was consulted and recommended transfer to a [facility] for neurology evaluation.  PCCM has been asked to admit.  Pertinent  Medical History   has a past medical history of Anemia, Anginal pain ([place]), Anxiety, ASD (atrial septal defect) (1989), Chronic abdominal pain, Chronic nausea, Chronic neck pain, Complication of anesthesia, Constipation, Coronary artery disease, Depression, Diabetes mellitus, Dyspnea, Heart murmur, History of cardiac catheterization (02/15/11 Dr. Shelva Majestic), History of kidney stones, Hyperlipidemia, Hypertension, Incomplete RBBB, Migraine headache, Nerve damage, Nonalcoholic fatty liver disease (09/30/2012), Ovarian cyst, Pain management, Peripheral neuropathy, Pneumonia, Stroke ([place]), SVD (spontaneous vaginal delivery), and Urinary tract  infection.,hp  Significant Hospital Events: Including procedures, antibiotic start and stop dates in addition to other pertinent events   11/16 present to [facility] with sz. Tx to [facility] for neuro eval.   Interim History / Subjective:  Patient blood culture came back positive for gram-negative rods She became afebrile EEG is ongoing   Objective   Blood pressure (!) 177/72, pulse 80, temperature (!) 97.5 F (36.4 C), resp. rate 12, height '5\' 5"'$  (1.651 m), weight 58.2 kg, SpO2 100 %.        Intake/Output Summary (Last 24 hours) at 04/16/2022 0844 Last data filed at 04/16/2022 0700 Gross per 24 hour  Intake 4769.37 ml  Output 1513 ml  Net 3256.37 ml   Filed Weights   04/15/22 1449 04/15/22 2215  Weight: 55 kg 58.2 kg    Examination: Physical exam: General: Acute on chronically ill-appearing female, lying on the bed HEENT: /AT, eyes anicteric.  moist mucus membranes Neuro: Sleepy, opens eyes with vocal stimuli, following simple commands, asterixis positive.  Strength is equal bilaterally Chest: Coarse breath sounds, no wheezes or rhonchi Heart: Irregularly irregular, no murmurs or gallops Abdomen: Soft, nontender, nondistended, bowel sounds present Skin: No rash   Resolved Hospital Problem list     Assessment & Plan:  Possible seizure-like activity  Upon presentation it was noted patient had possible seizure-like activity She received Keppra Currently on video EEG MRI brain is negative Appreciate neurology follow-up Acute meningitis was ruled out  Discontinue droplet precautions Stop vancomycin    Acute left-sided pyelonephritis Gram-negative bacteremia Patient presented with high-grade fever up to 105 UA showed leukocyte esterase is positive CT abdomen pelvis is suggestive of perinephritis stranding and gas in the urinary bladder raising concern.  Continue IV ceftriaxone Follow-up sensitivity  Community-acquired pneumonia was ruled out  Acute infectious  encephalopathy Mental status is improving Avoid sedation  AKI on CKD stage IIIa: creatinine 4 on presentation with a recent baseline just over 1.4  Acute rhabdomyolysis: CK 7680 on presentation Continue aggressive IV fluid resuscitation Monitor intake and output Avoid nephrotoxic agents  Metabolic acidosis: marginally elevated anion gap. Uremic, lactic Improved  DM  Hold metformin, glipizide while NPO Continue sliding scale insulin with CBG goal 140-180  CAD HTN Restart amlodipine, atorvastatin, isosorbide, Continue to hold metoprolol and hydralazine  Hypothyroid Restart Synthyroid  Chronic pain: Continue to hold home gabapentin, oxycodone, xanax   Best Practice (right click and "Reselect all SmartList Selections" daily)   Diet/type:  DVT prophylaxis: Subcu heparin GI prophylaxis: N/A Lines: N/A Foley:  N/A Code Status:  full code Last date of multidisciplinary goals of care discussion [pending]  Labs   CBC: Recent Labs  Lab 04/15/22 1453 04/16/22 0046 04/16/22 0320  WBC 6.2 22.1* 20.4*  NEUTROABS 5.9  --   --   HGB 13.2 9.6* 8.7*  HCT 41.5 29.6* 26.6*  MCV 79.5* 79.8* 81.1  PLT 84* 72* 70*    Basic Metabolic Panel: Recent Labs  Lab 04/15/22 1453 04/15/22 1948 04/16/22 0046 04/16/22 0320  NA 133*  --  136 138  K 4.0  --  3.9 3.9  CL 98  --  103 103  CO2 20*  --  19* 20*  GLUCOSE 207*  --  192* 223*  BUN 96*  --  81* 77*  CREATININE 4.08*  --  3.52* 3.30*  CALCIUM 8.8*  --  7.8* 8.0*  MG 2.4  --   --  2.1  PHOS  --  2.8  --  4.8*   GFR: Estimated Creatinine Clearance: 16.1 mL/min (A) (by C-G formula based on SCr of 3.3 mg/dL (H)). Recent Labs  Lab 04/15/22 1453 04/15/22 1800 04/15/22 2337 04/16/22 0046 04/16/22 0320  PROCALCITON  --   --   --  68.55  --   WBC 6.2  --   --  22.1* 20.4*  LATICACIDVEN 2.4* 5.3* 1.3 1.3 1.4    Liver Function Tests: Recent Labs  Lab 04/15/22 1453 04/16/22 0046  AST 130* 122*  ALT 57* 46*  ALKPHOS  173* 56  BILITOT 1.0 1.0  PROT 7.7 5.3*  ALBUMIN 3.5 2.2*   No results for input(s): "LIPASE", "AMYLASE" in the last 168 hours. Recent Labs  Lab 04/15/22 1800  AMMONIA 18    ABG    Component Value Date/Time   PHART 7.39 04/15/2022 2026   PCO2ART 32 04/15/2022 2026   PO2ART 93 04/15/2022 2026   HCO3 19.2 (L) 04/15/2022 2026   TCO2 27 06/01/2020 2238   ACIDBASEDEF 4.8 (H) 04/15/2022 2026   O2SAT 96.2 04/15/2022 2026     Coagulation Profile: No results for input(s): "INR", "PROTIME" in the last 168 hours.  Cardiac Enzymes: Recent Labs  Lab 04/15/22 1453  CKTOTAL 7,680*    HbA1C: Hgb A1c MFr Bld  Date/Time Value Ref Range Status  11/11/2021 03:45 AM 5.2 4.8 - 5.6 % Final    Comment:    (NOTE) Pre diabetes:          5.7%-6.4%  Diabetes:              >6.4%  Glycemic control for   <7.0% adults with diabetes   12/20/2019 12:01 PM 6.4 (H) 4.8 - 5.6 % Final    Comment:    (NOTE) Pre diabetes:  5.7%-6.4%  Diabetes:              >6.4%  Glycemic control for   <7.0% adults with diabetes     CBG: Recent Labs  Lab 04/15/22 1744 04/15/22 2212 04/15/22 2321 04/16/22 0327  GLUCAP 223* 154* 187* 201*     Total critical care time: 38 minutes  Performed by: [facility] care time was exclusive of separately billable procedures and treating other patients.   Critical care was necessary to treat or prevent imminent or life-threatening deterioration.   Critical care was time spent personally by me on the following activities: development of treatment plan with patient and/or surrogate as well as nursing, discussions with consultants, evaluation of patient's response to treatment, examination of patient, obtaining history from patient or surrogate, ordering and performing treatments and interventions, ordering and review of laboratory studies, ordering and review of radiographic studies, pulse oximetry and re-evaluation of patient's condition.    Jacky Kindle, MD [facility] See Amion for pager If no response to pager, please call [phone] until 7pm After 7pm, Please call E-link [phone]

## 2022-04-16 NOTE — Procedures (Signed)
Patient Name: Anita [name]  MRN: [id number]  Epilepsy Attending: Lora Havens  Referring Physician/Provider: Lorenza Chick, MD  Duration: 04/16/2022 5670 to 1558  Patient history: 61yo F with ams. EEG to evaluate for seizure  Level of alertness: Awake, asleep  AEDs during EEG study: None  Technical aspects: This EEG study was done with scalp electrodes positioned according to the 10-20 International system of electrode placement. Electrical activity was reviewed with band pass filter of 1-'70Hz'$ , sensitivity of 7 uV/mm, display speed of 28m/sec with a '60Hz'$  notched filter applied as appropriate. EEG data were recorded continuously and digitally stored.  Video monitoring was available and reviewed as appropriate.  Description: The posterior dominant rhythm consists of 7 Hz activity of moderate voltage (25-35 uV) seen predominantly in posterior head regions, symmetric and reactive to eye opening and eye closing. Sleep was characterized by vertex waves, sleep spindles (12 to 14 Hz), maximal frontocentral region. EEG showed continuous/intermittent generalized predominantly 5-7 Hz theta slowing admixed with intermittent 2-'3hz'$  delta slowing. Hyperventilation and photic stimulation were not performed.     ABNORMALITY - Continuous slow, generalized  IMPRESSION: This study is suggestive of mild to moderate diffuse encephalopathy, nonspecific etiology. No seizures or epileptiform discharges were seen throughout the recording.  [name] OBarbra Sarks

## 2022-04-16 NOTE — Progress Notes (Signed)
PHARMACY - PHYSICIAN COMMUNICATION CRITICAL VALUE ALERT - BLOOD CULTURE IDENTIFICATION (BCID)  Anita [name] is an 61 y.o. female who presented to [facility] on 04/15/2022 with a chief complaint of Sepsis.  Assessment:  Patient with 4/4 GNR in blood cultures. BCID with Ecoli, no resistance detected.   Name of physician (or Provider) Contacted: Dr. Tacy Learn  Current antibiotics: Ceftriaxone 2g IV every 24 hours.   Changes to prescribed antibiotics recommended:  Patient is on recommended antibiotics - No changes needed  Results for orders placed or performed during the hospital encounter of 04/15/22  Blood Culture ID Panel (Reflexed) (Collected: 04/15/2022  3:01 PM)  Result Value Ref Range   Enterococcus faecalis NOT DETECTED NOT DETECTED   Enterococcus Faecium NOT DETECTED NOT DETECTED   Listeria monocytogenes NOT DETECTED NOT DETECTED   Staphylococcus species NOT DETECTED NOT DETECTED   Staphylococcus aureus (BCID) NOT DETECTED NOT DETECTED   Staphylococcus epidermidis NOT DETECTED NOT DETECTED   Staphylococcus lugdunensis NOT DETECTED NOT DETECTED   Streptococcus species NOT DETECTED NOT DETECTED   Streptococcus agalactiae NOT DETECTED NOT DETECTED   Streptococcus pneumoniae NOT DETECTED NOT DETECTED   Streptococcus pyogenes NOT DETECTED NOT DETECTED   A.calcoaceticus-baumannii NOT DETECTED NOT DETECTED   Bacteroides fragilis NOT DETECTED NOT DETECTED   Enterobacterales DETECTED (A) NOT DETECTED   Enterobacter cloacae complex NOT DETECTED NOT DETECTED   Escherichia coli DETECTED (A) NOT DETECTED   Klebsiella aerogenes NOT DETECTED NOT DETECTED   Klebsiella oxytoca NOT DETECTED NOT DETECTED   Klebsiella pneumoniae NOT DETECTED NOT DETECTED   Proteus species NOT DETECTED NOT DETECTED   Salmonella species NOT DETECTED NOT DETECTED   Serratia marcescens NOT DETECTED NOT DETECTED   Haemophilus influenzae NOT DETECTED NOT DETECTED   Neisseria meningitidis NOT DETECTED NOT DETECTED    Pseudomonas aeruginosa NOT DETECTED NOT DETECTED   Stenotrophomonas maltophilia NOT DETECTED NOT DETECTED   Candida albicans NOT DETECTED NOT DETECTED   Candida auris NOT DETECTED NOT DETECTED   Candida glabrata NOT DETECTED NOT DETECTED   Candida krusei NOT DETECTED NOT DETECTED   Candida parapsilosis NOT DETECTED NOT DETECTED   Candida tropicalis NOT DETECTED NOT DETECTED   Cryptococcus neoformans/gattii NOT DETECTED NOT DETECTED   CTX-M ESBL NOT DETECTED NOT DETECTED   Carbapenem resistance IMP NOT DETECTED NOT DETECTED   Carbapenem resistance KPC NOT DETECTED NOT DETECTED   Carbapenem resistance NDM NOT DETECTED NOT DETECTED   Carbapenem resist OXA 48 LIKE NOT DETECTED NOT DETECTED   Carbapenem resistance VIM NOT DETECTED NOT DETECTED   Sloan Leiter, PharmD, BCPS, BCCCP Clinical Pharmacist Please refer to [facility] for [place] numbers 04/16/2022  11:35 AM

## 2022-04-16 NOTE — Progress Notes (Addendum)
[facility] Progress Note Patient Name: Anita [name] DOB: Feb 05, 1961 MRN: [id number]   Date of Service  04/16/2022  HPI/Events of Note  Nursing reports the patient has blood cultures positive for GNR in both the anaerobic and aerobic bottles. The patient is currently on Vancomycin and Ceftriaxone. The Ceftriaxone should provide GNR coverage.   eICU Interventions  Plan: Continue present management.  Pharmacy notified of culture results to track BCID's when available.      Intervention Category Major Interventions: Sepsis - evaluation and management;Infection - evaluation and management  [name] 04/16/2022, 4:55 AM

## 2022-04-16 NOTE — Progress Notes (Signed)
Neurology Progress Note   S:// Patient is awake and alert laying in bed in NAD, on LTM.  LTM continuous generalized slowing , no seizures identified Patient is oriented x 4, moving all 4 extremities equally and spontaneously. No focal neurological deficits.   O:// Current vital signs: BP (!) 166/76   Pulse 70   Temp 97.9 F (36.6 C)   Resp 14   Ht '5\' 5"'$  (1.651 m)   Wt 58.2 kg   SpO2 100%   BMI 21.35 kg/m  Vital signs in last 24 hours: Temp:  [97.3 F (36.3 C)-103.7 F (39.8 C)] 97.9 F (36.6 C) (11/17 0900) Pulse Rate:  [67-129] 70 (11/17 0900) Resp:  [10-24] 14 (11/17 0900) BP: (105-207)/(51-149) 166/76 (11/17 0900) SpO2:  [89 %-100 %] 100 % (11/17 0900) Weight:  [55 kg-58.2 kg] 58.2 kg (11/16 2215)  GENERAL: Awake, alert in NAD HEENT: - Normocephalic and atraumatic, dry mm LUNGS - Clear to auscultation bilaterally with no wheezes CV - S1S2 RRR, no m/r/g, equal pulses bilaterally. ABDOMEN - Soft, nontender, nondistended with normoactive BS Ext: warm, well perfused, intact peripheral pulses, no edema  NEURO:  Mental Status: AA&Ox3  Language: speech is clear.  Naming, repetition, fluency, and comprehension intact. Cranial Nerves: PERRL. EOMI, visual fields full, no facial asymmetry, facial sensation intact, hearing intact, tongue/uvula/soft palate midline, normal sternocleidomastoid and trapezius muscle strength. No evidence of tongue atrophy or fibrillations Motor: 5/5 in all 4 extremities Tone: is normal and bulk is normal Sensation- Intact to light touch bilaterally Coordination: FTN intact bilaterally, no ataxia in BLE. Gait- deferred   Medications  Current Facility-Administered Medications:    0.9 %  sodium chloride infusion, , Intravenous, PRN, Jacky Kindle, MD, Last Rate: 10 mL/hr at 04/16/22 0700, Infusion Verify at 04/16/22 0700   amLODipine (NORVASC) tablet 10 mg, 10 mg, Oral, Daily, Jacky Kindle, MD   aspirin EC tablet 81 mg, 81 mg, Oral, Daily,  Chand, Sudham, MD   atorvastatin (LIPITOR) tablet 20 mg, 20 mg, Oral, Daily, Jacky Kindle, MD   [START ON 04/17/2022] cefTRIAXone (ROCEPHIN) 2 g in sodium chloride 0.9 % 100 mL IVPB, 2 g, Intravenous, Q24H, Millen, Jessica B, RPH   Chlorhexidine Gluconate Cloth 2 % PADS 6 each, 6 each, Topical, Daily, Brand Males, MD, 6 each at 04/15/22 2218   docusate sodium (COLACE) capsule 100 mg, 100 mg, Oral, BID PRN, Corey Harold, NP   heparin injection 5,000 Units, 5,000 Units, Subcutaneous, Q8H, Chand, Sudham, MD   insulin aspart (novoLOG) injection 0-9 Units, 0-9 Units, Subcutaneous, TID WC, Chand, Sudham, MD   isosorbide mononitrate (IMDUR) 24 hr tablet 30 mg, 30 mg, Oral, Daily, Chand, Sudham, MD   lactated ringers infusion, , Intravenous, Continuous, Priscella Mann, RPH   levothyroxine (SYNTHROID) tablet 25 mcg, 25 mcg, Oral, Q0600, Chand, Sudham, MD   polyethylene glycol (MIRALAX / GLYCOLAX) packet 17 g, 17 g, Oral, Daily PRN, Corey Harold, NP   polyethylene glycol (MIRALAX / GLYCOLAX) packet 17 g, 17 g, Oral, BID, Chand, Sudham, MD   senna-docusate (Senokot-S) tablet 1 tablet, 1 tablet, Oral, BID, Chand, Sudham, MD   sodium chloride flush (NS) 0.9 % injection 3 mL, 3 mL, Intravenous, Q12H, Chand, Sudham, MD   thiamine (VITAMIN B1) 500 mg in normal saline (50 mL) IVPB, 500 mg, Intravenous, Q8H, Stopped at 04/16/22 0627 **FOLLOWED BY** [START ON 04/19/2022] thiamine (VITAMIN B1) injection 100 mg, 100 mg, Intravenous, Q24H, Bhagat, Srishti L, MD Labs CBC    Component Value  Date/Time   WBC 20.4 (H) 04/16/2022 0320   RBC 3.28 (L) 04/16/2022 0320   HGB 8.7 (L) 04/16/2022 0320   HGB 9.9 (L) 07/04/2020 1607   HCT 26.6 (L) 04/16/2022 0320   HCT 31.0 (L) 07/04/2020 1607   PLT 70 (L) 04/16/2022 0320   PLT 335 07/04/2020 1607   MCV 81.1 04/16/2022 0320   MCV 87 07/04/2020 1607   MCH 26.5 04/16/2022 0320   MCHC 32.7 04/16/2022 0320   RDW 16.5 (H) 04/16/2022 0320   RDW 13.2 07/04/2020  1607   LYMPHSABS 0.1 (L) 04/15/2022 1453   MONOABS 0.2 04/15/2022 1453   EOSABS 0.0 04/15/2022 1453   BASOSABS 0.0 04/15/2022 1453    CMP     Component Value Date/Time   NA 138 04/16/2022 0320   NA 141 07/04/2020 1607   K 3.9 04/16/2022 0320   CL 103 04/16/2022 0320   CO2 20 (L) 04/16/2022 0320   GLUCOSE 223 (H) 04/16/2022 0320   BUN 77 (H) 04/16/2022 0320   BUN 24 07/04/2020 1607   CREATININE 3.30 (H) 04/16/2022 0320   CALCIUM 8.0 (L) 04/16/2022 0320   PROT 5.3 (L) 04/16/2022 0046   PROT 6.8 08/01/2018 0950   ALBUMIN 2.2 (L) 04/16/2022 0046   ALBUMIN 4.4 08/01/2018 0950   AST 122 (H) 04/16/2022 0046   ALT 46 (H) 04/16/2022 0046   ALKPHOS 56 04/16/2022 0046   BILITOT 1.0 04/16/2022 0046   BILITOT 0.2 08/01/2018 0950   GFRNONAA 15 (L) 04/16/2022 0320   GFRAA 50 (L) 07/04/2020 1607    glycosylated hemoglobin  Lipid Panel     Component Value Date/Time   CHOL 64 (L) 08/01/2018 0950   TRIG 37 08/01/2018 0950   HDL 41 08/01/2018 0950   CHOLHDL 1.6 08/01/2018 0950   CHOLHDL 3.0 09/29/2012 0828   VLDL 35 09/29/2012 0828   LDLCALC 16 08/01/2018 0950     Imaging I have reviewed images in epic and the results pertinent to this consultation are:  CT-scan of the brain no acute abnormality   MRI examination of the brain 1. No acute intracranial abnormality. 2. Findings of chronic small vessel ischemia and volume loss.   Labs: WBC 20.4 HGB 8.7 BUN 96-81-77 Cr 4.08-3.52-3.30 (Baseline creatinine 1.08, baseline BUN 25) Na 133-136-138 Bld Cx with BNR  CSF: RBC 5325/54, WBC 6/2, protein 44, glucose 135 (serum 133-154) Ammonia normal TSH extremely low at 0.168  Lactate rising from 2.4-5.3  B12 level in January 2020 -- 190  EEG 11/17: This study is suggestive of mild to moderate diffuse encephalopathy, nonspecific etiology. No seizures or epileptiform discharges were seen throughout the recording   Assessment:  61 y.o. female with a past medical history  significant for chronic pain, hypertension, hyperlipidemia, diabetes, coronary artery disease, TIA, prior kidney stones, and ASD s/p repair, incomplete right bundle branch block, migraine headaches  She was given Versed by EMS due to concern for seizure-like activity and family reported seizures at home which were "twitching all over" twice yesterday and twice today.     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 Tacy Learn  Beulah Gandy DNP, [place]   Attending Neurohospitalist Addendum Patient seen and examined with APP/Resident. Agree with the history and physical as documented above. Agree with the plan as documented, which I helped formulate. I  have independently reviewed the chart, obtained history, review of systems and examined the patient.I have personally reviewed pertinent head/neck/spine imaging (CT/MRI). Please feel free to call with any questions.  -- Amie Portland, MD Neurologist [facility] Pager: [phone]   CRITICAL CARE ATTESTATION Performed by: Amie Portland, MD Total critical care time: 39 minutes Critical care time was exclusive of separately billable procedures and treating other patients and/or supervising APPs/Residents/Students Critical care was necessary to treat or prevent imminent or life-threatening deterioration. This patient is critically ill and at significant risk for neurological worsening and/or death and care requires constant monitoring. Critical care was time spent personally by me on the following activities: development of treatment plan with patient and/or surrogate as well as nursing, discussions with consultants, evaluation of patient's response to treatment, examination of patient, obtaining history from patient or  surrogate, ordering and performing treatments and interventions, ordering and review of laboratory studies, ordering and review of radiographic studies, pulse oximetry, re-evaluation of patient's condition, participation in multidisciplinary rounds and medical decision making of high complexity in the care of this patient.

## 2022-04-16 NOTE — TOC Initial Note (Signed)
Transition of Care [facility]) - Initial/Assessment Note    Patient Details  Name: Anita [name] MRN: [id number] Date of Birth: 1960-09-03  Transition [facility]) CM/SW Contact:    Tom-Johnson, Renea Ee, RN Phone Number: 04/16/2022, 2:23 PM  Clinical Narrative:                  CM spoke with patient at bedside about needs for post hospital transition. Admitted for Acute Metabolic Encephalopathy with Seizurelike activities. Currently on continuous EEG, Neurology following. On 2L O2.  Patient states she lives with her husband, has two supportive children. Not employed, on disability. Does not drive, husband drives. Has a cane, walker, shower seat and wheelchair at home.  PCP is Redmond School, MD and uses [employer].  No TOC needs or recommendations noted at this time. CM will continue to follow as patient progresses towards discharge.       Barriers to Discharge: Continued Medical Work up   Patient Goals and CMS Choice Patient states their goals for this hospitalization and ongoing recovery are:: To return home CMS Medicare.gov Compare Post Acute Care list provided to:: Patient    Expected Discharge Plan and Services     Discharge Planning Services: CM Consult   Living arrangements for the past 2 months: Single Family Home                                      Prior Living Arrangements/Services Living arrangements for the past 2 months: Single Family Home   Patient language and need for interpreter reviewed:: Yes Do you feel safe going back to the place where you live?: Yes      Need for Family Participation in Patient Care: Yes (Comment) Care giver support system in place?: Yes (comment) Current home services: DME (Cane, wlaker, shower seat, w/c) Criminal Activity/Legal Involvement Pertinent to Current Situation/Hospitalization: No - Comment as needed  Activities of Daily Living      Permission Sought/Granted Permission sought to share information  with : Case Manager, Family Supports Permission granted to share information with : Yes, Verbal Permission Granted              Emotional Assessment Appearance:: Appears stated age Attitude/Demeanor/Rapport: Engaged, Gracious Affect (typically observed): Accepting, Appropriate, Calm, Hopeful, Pleasant Orientation: : Oriented to Self, Oriented to Place, Oriented to  Time, Oriented to Situation Alcohol / Substance Use: Not Applicable Psych Involvement: No (comment)  Admission diagnosis:  Acute cystitis without hematuria [N30.00] AKI (acute kidney injury) ([place]) [N17.9] Sacral decubitus ulcer, stage II ([place]) [L89.152] Non-traumatic rhabdomyolysis [O53.66] Acute metabolic encephalopathy [Y40.34] Sepsis with acute renal failure without septic shock, due to unspecified organism, unspecified acute renal failure type ([place]) [A41.9, R65.20, N17.9] Seizure ([place]) [R56.9] Patient Active Problem List   Diagnosis Date Noted   Seizure ([place]) 04/16/2022   Non-traumatic rhabdomyolysis 04/16/2022   Malnutrition of moderate degree 11/11/2021   COVID-19 virus infection 11/11/2021   Hypertensive emergency 11/11/2021   Sepsis ([place]) 11/10/2021   UTI (urinary tract infection) 11/10/2021   AKI (acute kidney injury) ([place]) 74/25/9563   Acute metabolic encephalopathy 87/56/4332   Chronic bilateral low back pain with left-sided sciatica 04/27/2017   Chest pain 08/08/2014   Angioedema 07/09/2013   Acute anaphylaxis 07/09/2013   Abdominal distention 02/01/2013   Tobacco abuse 95/18/8416   Nonalcoholic fatty liver disease 09/30/2012   Constipation 03/29/2012   Abdominal pain: RUQ, resolved at  discharge  03/29/2012   ASD (atrial septal defect) repair 1988 03/08/2012   CAD (coronary artery disease), 30% LAD 9/12 03/08/2012   HTN (hypertension) 03/08/2012   Diabetes mellitus with neuropathy ([place]) 03/08/2012   Dyslipidemia 03/08/2012   Abnormal EKG, incomplete RBBB 03/08/2012   Chronic pain due to trauma  03/08/2012   Unstable angina, negative MI, negative to mildly positive Nuc study Known 30% LAD disease, medical therapy 03/08/2012   PCP:  Redmond School, MD Pharmacy:   [place] [place] [address] [place] [address] Phone: [phone] Fax: [phone]     Social Determinants of Health (SDOH) Interventions    Readmission Risk Interventions     No data to display

## 2022-04-16 NOTE — Progress Notes (Signed)
Inpatient Diabetes Program Recommendations  AACE/ADA: New Consensus Statement on Inpatient Glycemic Control (2015)  Target Ranges:  Prepandial:   less than 140 mg/dL      Peak postprandial:   less than 180 mg/dL (1-2 hours)      Critically ill patients:  140 - 180 mg/dL   Lab Results  Component Value Date   GLUCAP 201 (H) 04/16/2022   HGBA1C 5.2 11/11/2021    Review of Glycemic Control  Latest Reference Range & Units 04/15/22 22:12 04/15/22 23:21 04/16/22 03:27  Glucose-Capillary 70 - 99 mg/dL 154 (H) 187 (H) 201 (H)   Diabetes history: DM 2 Outpatient Diabetes medications: Glucotrol 5 mg tid, Metformin 500 mg bid  Current orders for Inpatient glycemic control:  Novolog 0-9 units tid with meals  Inpatient Diabetes Program Recommendations:    Agree with current orders.  Will follow.   Thanks,  Adah Perl, RN, BC-ADM Inpatient Diabetes Coordinator Pager [phone]  (8a-5p)

## 2022-04-16 NOTE — Progress Notes (Signed)
LTM EEG hooked up and running - no initial skin breakdown - push button tested - [facility] monitoring.  

## 2022-04-17 ENCOUNTER — Inpatient Hospital Stay (HOSPITAL_COMMUNITY): Payer: Medicare Other

## 2022-04-17 DIAGNOSIS — G9341 Metabolic encephalopathy: Secondary | ICD-10-CM | POA: Diagnosis not present

## 2022-04-17 DIAGNOSIS — R7881 Bacteremia: Secondary | ICD-10-CM | POA: Diagnosis not present

## 2022-04-17 DIAGNOSIS — I4892 Unspecified atrial flutter: Secondary | ICD-10-CM

## 2022-04-17 DIAGNOSIS — N179 Acute kidney failure, unspecified: Secondary | ICD-10-CM | POA: Diagnosis not present

## 2022-04-17 DIAGNOSIS — M6282 Rhabdomyolysis: Secondary | ICD-10-CM | POA: Diagnosis not present

## 2022-04-17 LAB — SJOGRENS SYNDROME-B EXTRACTABLE NUCLEAR ANTIBODY: SSB (La) (ENA) Antibody, IgG: <0.2 AI (ref 0.0–0.9)

## 2022-04-17 LAB — COMPREHENSIVE METABOLIC PANEL
ALT: 60 U/L — ABNORMAL HIGH (ref 0–44)
AST: 117 U/L — ABNORMAL HIGH (ref 15–41)
Albumin: 2.4 g/dL — ABNORMAL LOW (ref 3.5–5.0)
Alkaline Phosphatase: 109 U/L (ref 38–126)
Anion gap: 11 (ref 5–15)
BUN: 54 mg/dL — ABNORMAL HIGH (ref 8–23)
CO2: 23 mmol/L (ref 22–32)
Calcium: 8.8 mg/dL — ABNORMAL LOW (ref 8.9–10.3)
Chloride: 108 mmol/L (ref 98–111)
Creatinine, Ser: 2.14 mg/dL — ABNORMAL HIGH (ref 0.44–1.00)
GFR, Estimated: 26 mL/min — ABNORMAL LOW (ref >60)
Glucose, Bld: 184 mg/dL — ABNORMAL HIGH (ref 70–99)
Potassium: 3.7 mmol/L (ref 3.5–5.1)
Sodium: 142 mmol/L (ref 135–145)
Total Bilirubin: 0.4 mg/dL (ref 0.3–1.2)
Total Protein: 5.7 g/dL — ABNORMAL LOW (ref 6.5–8.1)

## 2022-04-17 LAB — URINE CULTURE: Culture: >=100000 — AB

## 2022-04-17 LAB — CBC WITH DIFFERENTIAL/PLATELET
Abs Immature Granulocytes: 0.05 10*3/uL (ref 0.00–0.07)
Basophils Absolute: 0 10*3/uL (ref 0.0–0.1)
Basophils Relative: 0 %
Eosinophils Absolute: 0 10*3/uL (ref 0.0–0.5)
Eosinophils Relative: 0 %
HCT: 31.5 % — ABNORMAL LOW (ref 36.0–46.0)
Hemoglobin: 9.9 g/dL — ABNORMAL LOW (ref 12.0–15.0)
Immature Granulocytes: 1 %
Lymphocytes Relative: 5 %
Lymphs Abs: 0.5 10*3/uL — ABNORMAL LOW (ref 0.7–4.0)
MCH: 25.4 pg — ABNORMAL LOW (ref 26.0–34.0)
MCHC: 31.4 g/dL (ref 30.0–36.0)
MCV: 80.8 fL (ref 80.0–100.0)
Monocytes Absolute: 0.7 10*3/uL (ref 0.1–1.0)
Monocytes Relative: 7 %
Neutro Abs: 9.2 10*3/uL — ABNORMAL HIGH (ref 1.7–7.7)
Neutrophils Relative %: 87 %
Platelets: 63 10*3/uL — ABNORMAL LOW (ref 150–400)
RBC: 3.9 MIL/uL (ref 3.87–5.11)
RDW: 16.5 % — ABNORMAL HIGH (ref 11.5–15.5)
WBC: 10.5 10*3/uL (ref 4.0–10.5)
nRBC: 0 % (ref 0.0–0.2)

## 2022-04-17 LAB — GLUCOSE, CAPILLARY
Glucose-Capillary: 183 mg/dL — ABNORMAL HIGH (ref 70–99)
Glucose-Capillary: 156 mg/dL — ABNORMAL HIGH (ref 70–99)
Glucose-Capillary: 139 mg/dL — ABNORMAL HIGH (ref 70–99)
Glucose-Capillary: 127 mg/dL — ABNORMAL HIGH (ref 70–99)

## 2022-04-17 LAB — GLOMERULAR BASEMENT MEMBRANE ANTIBODIES: GBM Ab: <0.2 U (ref 0.0–0.9)

## 2022-04-17 LAB — C4 COMPLEMENT: Complement C4, Body Fluid: 18 mg/dL (ref 12–38)

## 2022-04-17 LAB — ECHOCARDIOGRAM COMPLETE
AR max vel: 1.87 cm2
AV Area VTI: 1.85 cm2
AV Area mean vel: 1.8 cm2
AV Mean grad: 3 mmHg
AV Peak grad: 5.6 mmHg
Ao pk vel: 1.18 m/s
Area-P 1/2: 3.6 cm2
Calc EF: 59.9 %
Height: 65 in
MV M vel: 3.13 m/s
MV Peak grad: 39.2 mmHg
S' Lateral: 2.5 cm
Single Plane A2C EF: 51 %
Single Plane A4C EF: 65.1 %
Weight: 2052.92 oz

## 2022-04-17 LAB — THYROID ANTIBODIES
Thyroglobulin Antibody: <1 IU/mL (ref 0.0–0.9)
Thyroperoxidase Ab SerPl-aCnc: <9 IU/mL (ref 0–34)

## 2022-04-17 LAB — C3 COMPLEMENT: C3 Complement: 75 mg/dL — ABNORMAL LOW (ref 82–167)

## 2022-04-17 LAB — ANA: Anti Nuclear Antibody (ANA): NEGATIVE

## 2022-04-17 LAB — ANTI-DNA ANTIBODY, DOUBLE-STRANDED: ds DNA Ab: <1 IU/mL (ref 0–9)

## 2022-04-17 LAB — SJOGRENS SYNDROME-A EXTRACTABLE NUCLEAR ANTIBODY: SSA (Ro) (ENA) Antibody, IgG: <0.2 AI (ref 0.0–0.9)

## 2022-04-17 LAB — T3: T3, Total: 89 ng/dL (ref 71–180)

## 2022-04-17 MED ORDER — CYANOCOBALAMIN 1000 MCG/ML IJ SOLN
1000.0000 ug | Freq: Every day | INTRAMUSCULAR | Status: DC
  Administered 2022-04-17 – 2022-04-20 (×4): 1000 ug via SUBCUTANEOUS
  Filled 2022-04-17 (×4): qty 1

## 2022-04-17 MED ORDER — HYDRALAZINE HCL 20 MG/ML IJ SOLN
10.0000 mg | Freq: Once | INTRAMUSCULAR | Status: AC
  Administered 2022-04-17: 10 mg via INTRAVENOUS
  Filled 2022-04-17: qty 1

## 2022-04-17 MED ORDER — GABAPENTIN 600 MG PO TABS
300.0000 mg | ORAL_TABLET | Freq: Two times a day (BID) | ORAL | Status: DC
  Administered 2022-04-17 – 2022-04-18 (×4): 300 mg via ORAL
  Filled 2022-04-17: qty 1
  Filled 2022-04-17 (×2): qty 0.5
  Filled 2022-04-17 (×2): qty 1

## 2022-04-17 MED ORDER — NICOTINE 21 MG/24HR TD PT24
21.0000 mg | MEDICATED_PATCH | Freq: Every day | TRANSDERMAL | Status: DC
  Administered 2022-04-17 – 2022-04-19 (×3): 21 mg via TRANSDERMAL
  Filled 2022-04-17 (×3): qty 1

## 2022-04-17 MED ORDER — OXYCODONE HCL 5 MG PO TABS
5.0000 mg | ORAL_TABLET | Freq: Four times a day (QID) | ORAL | Status: DC | PRN
  Administered 2022-04-17 – 2022-04-19 (×4): 5 mg via ORAL
  Filled 2022-04-17 (×4): qty 1

## 2022-04-17 MED ORDER — ALPRAZOLAM 0.25 MG PO TABS
0.2500 mg | ORAL_TABLET | Freq: Two times a day (BID) | ORAL | Status: DC
  Administered 2022-04-17 – 2022-04-19 (×5): 0.25 mg via ORAL
  Filled 2022-04-17 (×5): qty 1

## 2022-04-17 MED ORDER — HYDRALAZINE HCL 20 MG/ML IJ SOLN
10.0000 mg | Freq: Four times a day (QID) | INTRAMUSCULAR | Status: DC | PRN
  Administered 2022-04-17: 10 mg via INTRAVENOUS
  Filled 2022-04-17: qty 1

## 2022-04-17 MED ORDER — THIAMINE MONONITRATE 100 MG PO TABS
100.0000 mg | ORAL_TABLET | Freq: Every day | ORAL | Status: DC
  Administered 2022-04-19 – 2022-04-20 (×2): 100 mg via ORAL
  Filled 2022-04-17 (×2): qty 1

## 2022-04-17 NOTE — Progress Notes (Addendum)
PROGRESS NOTE        PATIENT DETAILS Name: Anita [name] Age: 61 y.o. Sex: female Date of Birth: 1960/12/11 Admit Date: 04/15/2022 Admitting Physician Brand Males, MD UVO:[id number], Purcell Nails, MD  Brief Summary: Patient is a 61 y.o.  female with history of CAD, lacunar stroke, HTN, DM-2, CKD stage IIIa, hypothyroidism, chronic pain-presented with acute metabolic encephalopathy/seizure-like activity-upon further evaluation-she was found to have pyelonephritis with gram-negative bacteremia.  See below for further details.  Significant events: 11/16>> admit to ICU-severe encephalopathy-suspect possible seizures-fever. 11/17>> brief A-fib with RVR-spontaneously converted to sinus rhythm. 11/18>> transfer to [facility].  Significant studies: 11/16>> CSF protein 44, WBC 2 (in tube 4) 11/16>> CXR: No PNA 11/16>> CT head: No acute abnormality 11/16>> CT chest/abdomen/pelvis: Bilateral patchy groundglass opacities-likely atelectasis.  Mild perinephric/peripelvic stranding with a nonobstructive 9 mm stone in the left kidney.  Gas in the urinary bladder. 11/17>> MRI brain: No acute intracranial abnormality. 11/17>> LTM EEG: No seizures.  Significant microbiology data: 11/16>> COVID/influenza PCR: Negative 11/16>> blood culture: E. coli-sensitivities pending. 11/16>> CSF culture: Negative  Procedures: 11/16>> lumbar puncture.  Consults: Neurology PCCM  Subjective: Lying comfortably in bed-denies any chest pain or shortness of breath.  Objective: Vitals: Blood pressure (!) 181/82, pulse 80, temperature 98.2 F (36.8 C), temperature source Oral, resp. rate 18, height '5\' 5"'$  (1.651 m), weight 58.2 kg, SpO2 97 %.   Exam: Gen Exam:Alert awake-not in any distress HEENT:atraumatic, normocephalic Chest: B/L clear to auscultation anteriorly CVS:S1S2 regular Abdomen:soft non tender, non distended Extremities:no edema Neurology: Non focal Skin: no rash  Pertinent  Labs/Radiology:    Latest Ref Rng & Units 04/17/2022    7:21 AM 04/16/2022    3:20 AM 04/16/2022   12:46 AM  CBC  WBC 4.0 - 10.5 K/uL 10.5  20.4  22.1   Hemoglobin 12.0 - 15.0 g/dL 9.9  8.7  9.6   Hematocrit 36.0 - 46.0 % 31.5  26.6  29.6   Platelets 150 - 400 K/uL 63  70  72     Lab Results  Component Value Date   NA 142 04/17/2022   K 3.7 04/17/2022   CL 108 04/17/2022   CO2 23 04/17/2022      Assessment/Plan: Severe sepsis (POA) E. coli bacteremia Left-sided pyelonephritis Sepsis physiology is resolved Continue IV Rocephin Await final culture results  Acute metabolic encephalopathy Myoclonus CSF inconsistent with meningitis/encephalitis MRI brain/EEG negative-felt to have myoclonus from encephalopathy and not seizures. Felt to have severe encephalopathy/myoclonus in the setting of sepsis Encephalopathy significantly improved-almost back to baseline No myoclonus observed today  Discussed with Dr. Delsa Grana further work-up/no AEDs needed  AKI on CKD stage IIIa AKI due to sepsis/obstructive uropathy Creatinine improving-noted back to baseline Avoid nephrotoxic agents Follow electrolytes Voiding trial over the next few days-continue Foley catheter  Acute urinary retention Per nursing staff-required numerous in/out cath-Foley finally replaced 11/17. Continue bethanechol for another day Voiding trial in the next day or so Mobilize with PT/OT  Rhabdomyolysis Likely due to myoclonus No evidence of seizures No evidence of trauma Mild-repeat in a.m.  Transaminitis Mild Either from rhabdomyolysis/shock liver Supportive care-follow LFTs  PAF with RVR Brief episode of RVR yesterday evening Spontaneously converted to sinus rhythm Continue beta-blocker Given brief episode-ongoing thrombocytopenia-hold off on anticoagulation (CHA2DS2-VASc almost 5) Check echo Reviewed prior cardiology note April 2022-patient has had prior history of palpitations-Holter  monitor  only showed atrial tachycardia without any A-fib.  Normocytic anemia Due to critical illness No evidence of blood loss Follow CBC Transfuse if<7  Thrombocytopenia Due to sepsis/gram-negative bacteremia Follow for now See above regarding concerns to start anticoagulation for A-fib.  History of ASD repair November 1988  Nonobstructive CAD-on LHC  2012 Currently no anginal symptoms  History of lacunar infarct Per prior cardiology note Unclear if this was asymptomatic-as patient does not remember having a CVA.  HTN BP fluctuating but mostly on the higher side Continue amlodipine, Imdur-restart metoprolol Decrease rate of IVF Reassess 11/19  DM-2 (A1c 5.2 on 6/14) CBG stable with SSI Repeat A1c with a.m. labs Resume oral hypoglycemic agents on discharge-but may need to adjust depending on A1c  HLD Continue statin-watch LFTs  Hypothyroidism Continue Synthroid TSH suppressed on 11/16 but this could be euthyroid syndrome Repeat TSH in several weeks as an outpatient.  Chronic pain syndrome Since encephalopathy improving-we will resume oxycodone/Neurontin but significant lower doses than usual. Reassess 11/19 and optimize accordingly  Anxiety Apparently on chronic benzos at home-we will resume low-dose At risk for withdrawal seizures if benzos held for prolonged.   Reassess 11/19-and optimize accordingly  Debility/deconditioning Due to acute illness PT/OT eval  Pressure Ulcer: Pressure Injury 04/15/22 Sacrum Mid Deep Tissue Pressure Injury - Purple or maroon localized area of discolored intact skin or blood-filled blister due to damage of underlying soft tissue from pressure and/or shear. Blanchable redness around 2 cm DPI to  (Active)  04/15/22 1600  Location: Sacrum  Location Orientation: Mid  Staging: Deep Tissue Pressure Injury - Purple or maroon localized area of discolored intact skin or blood-filled blister due to damage of underlying soft tissue from pressure  and/or shear.  Wound Description (Comments): Blanchable redness around 2 cm DPI to sacruum  Present on Admission: Yes  Dressing Type ABD;Non adherent 04/17/22 0800    BMI: Estimated body mass index is 21.35 kg/m as calculated from the following:   Height as of this encounter: '5\' 5"'$  (1.651 m).   Weight as of this encounter: 58.2 kg.   Code status:   Code Status: Full Code   DVT Prophylaxis: heparin injection 5,000 Units Start: 04/16/22 1100    Family Communication:  Daughter-Heather [phone] -11/18   Disposition Plan: Status is: Inpatient Remains inpatient appropriate because: Resolving sepsis physiology-noted stable for discharge.   Planned Discharge Destination:Home health vs SNF   Diet: Diet Order             Diet Heart Room service appropriate? Yes; Fluid consistency: Thin  Diet effective now                     Antimicrobial agents: Anti-infectives (From admission, onward)    Start     Dose/Rate Route Frequency Ordered Stop   04/17/22 0615  cefTRIAXone (ROCEPHIN) 2 g in sodium chloride 0.9 % 100 mL IVPB        2 g 200 mL/hr over 30 Minutes Intravenous Every 24 hours 04/16/22 0843     04/17/22 0603  cefTRIAXone (ROCEPHIN) 2 g in sodium chloride 0.9 % 100 mL IVPB  Status:  Discontinued        2 g 200 mL/hr over 30 Minutes Intravenous Every 24 hours 04/16/22 0838 04/16/22 0842   04/16/22 0600  cefTRIAXone (ROCEPHIN) 2 g in sodium chloride 0.9 % 100 mL IVPB  Status:  Discontinued        2 g 200 mL/hr over 30 Minutes Intravenous  Every 12 hours 04/16/22 0124 04/16/22 0838   04/15/22 1639  vancomycin variable dose per unstable renal function (pharmacist dosing)  Status:  Discontinued         Does not apply See admin instructions 04/15/22 1639 04/16/22 0836   04/15/22 1530  vancomycin (VANCOCIN) IVPB 1000 mg/200 mL premix        1,000 mg 200 mL/hr over 60 Minutes Intravenous  Once 04/15/22 1517 04/15/22 1710   04/15/22 1515  cefTRIAXone (ROCEPHIN) 2 g in  sodium chloride 0.9 % 100 mL IVPB        2 g 200 mL/hr over 30 Minutes Intravenous  Once 04/15/22 1514 04/15/22 1605        MEDICATIONS: Scheduled Meds:  amLODipine  10 mg Oral Daily   aspirin EC  81 mg Oral Daily   atorvastatin  20 mg Oral Daily   bethanechol  10 mg Oral TID   Chlorhexidine Gluconate Cloth  6 each Topical Daily   cyanocobalamin  1,000 mcg Subcutaneous Daily   heparin injection (subcutaneous)  5,000 Units Subcutaneous Q8H   insulin aspart  0-9 Units Subcutaneous TID WC   isosorbide mononitrate  30 mg Oral Daily   levothyroxine  25 mcg Oral Q0600   metoprolol tartrate  100 mg Oral BID   nicotine  21 mg Transdermal Daily   polyethylene glycol  17 g Oral BID   senna-docusate  1 tablet Oral BID   sodium chloride flush  3 mL Intravenous Q12H   [START ON 04/19/2022] thiamine (VITAMIN B1) injection  100 mg Intravenous Q24H   Continuous Infusions:  sodium chloride Stopped (04/16/22 1029)   cefTRIAXone (ROCEPHIN)  IV Stopped (04/17/22 0554)   lactated ringers 150 mL/hr at 04/17/22 0900   thiamine (VITAMIN B1) injection Stopped (04/17/22 0549)   PRN Meds:.sodium chloride, docusate sodium, hydrALAZINE, polyethylene glycol   I have personally reviewed following labs and imaging studies  LABORATORY DATA: CBC: Recent Labs  Lab 04/15/22 1453 04/16/22 0046 04/16/22 0320 04/17/22 0721  WBC 6.2 22.1* 20.4* 10.5  NEUTROABS 5.9  --   --  9.2*  HGB 13.2 9.6* 8.7* 9.9*  HCT 41.5 29.6* 26.6* 31.5*  MCV 79.5* 79.8* 81.1 80.8  PLT 84* 72* 70* 63*    Basic Metabolic Panel: Recent Labs  Lab 04/15/22 1453 04/15/22 1948 04/16/22 0046 04/16/22 0320 04/17/22 0721  NA 133*  --  136 138 142  K 4.0  --  3.9 3.9 3.7  CL 98  --  103 103 108  CO2 20*  --  19* 20* 23  GLUCOSE 207*  --  192* 223* 184*  BUN 96*  --  81* 77* 54*  CREATININE 4.08*  --  3.52* 3.30* 2.14*  CALCIUM 8.8*  --  7.8* 8.0* 8.8*  MG 2.4  --   --  2.1  --   PHOS  --  2.8  --  4.8*  --      GFR: Estimated Creatinine Clearance: 24.8 mL/min (A) (by C-G formula based on SCr of 2.14 mg/dL (H)).  Liver Function Tests: Recent Labs  Lab 04/15/22 1453 04/16/22 0046 04/17/22 0721  AST 130* 122* 117*  ALT 57* 46* 60*  ALKPHOS 173* 56 109  BILITOT 1.0 1.0 0.4  PROT 7.7 5.3* 5.7*  ALBUMIN 3.5 2.2* 2.4*   No results for input(s): "LIPASE", "AMYLASE" in the last 168 hours. Recent Labs  Lab 04/15/22 1800  AMMONIA 18    Coagulation Profile: No results for input(s): "INR", "PROTIME"  in the last 168 hours.  Cardiac Enzymes: Recent Labs  Lab 04/15/22 1453  CKTOTAL 7,680*    BNP (last 3 results) No results for input(s): "PROBNP" in the last 8760 hours.  Lipid Profile: No results for input(s): "CHOL", "HDL", "LDLCALC", "TRIG", "CHOLHDL", "LDLDIRECT" in the last 72 hours.  Thyroid Function Tests: Recent Labs    04/15/22 2337 04/16/22 0320  TSH 0.168*  --   FREET4  --  1.28*    Anemia Panel: Recent Labs    04/15/22 2337  VITAMINB12 205    Urine analysis:    Component Value Date/Time   COLORURINE YELLOW 04/15/2022 1453   APPEARANCEUR CLOUDY (A) 04/15/2022 1453   LABSPEC 1.015 04/15/2022 1453   PHURINE 6.0 04/15/2022 1453   GLUCOSEU NEGATIVE 04/15/2022 1453   HGBUR LARGE (A) 04/15/2022 1453   BILIRUBINUR NEGATIVE 04/15/2022 1453   KETONESUR NEGATIVE 04/15/2022 1453   PROTEINUR 100 (A) 04/15/2022 1453   UROBILINOGEN 0.2 02/04/2015 1915   NITRITE NEGATIVE 04/15/2022 1453   LEUKOCYTESUR MODERATE (A) 04/15/2022 1453    Sepsis Labs: Lactic Acid, Venous    Component Value Date/Time   LATICACIDVEN 1.4 04/16/2022 0320    MICROBIOLOGY: Recent Results (from the past 240 hour(s))  Urine Culture     Status: Abnormal (Preliminary result)   Collection Time: 04/15/22  2:53 PM   Specimen: Urine, Catheterized  Result Value Ref Range Status   Specimen Description   Final    URINE, CATHETERIZED Performed at [facility], [address], [place],  [place] [address]    Special Requests   Final    NONE Performed at [facility], [address] [place], [place] [address]    Culture (A)  Final    >=100,000 COLONIES/mL GRAM NEGATIVE RODS IDENTIFICATION AND SUSCEPTIBILITIES TO FOLLOW Performed at [facility], [place] [address], [place], [place] [address]    Report Status PENDING  Incomplete  Resp Panel by RT-PCR (Flu A&B, Covid) Anterior Nasal Swab     Status: None   Collection Time: 04/15/22  2:53 PM   Specimen: Anterior Nasal Swab  Result Value Ref Range Status   SARS Coronavirus 2 by RT PCR NEGATIVE NEGATIVE Final    Comment: (NOTE) SARS-CoV-2 target nucleic acids are NOT DETECTED.  The SARS-CoV-2 RNA is generally detectable in upper respiratory specimens during the acute phase of infection. The lowest concentration of SARS-CoV-2 viral copies this assay can detect is 138 copies/mL. A negative result does not preclude SARS-Cov-2 infection and should not be used as the sole basis for treatment or other patient management decisions. A negative result may occur with  improper specimen collection/handling, submission of specimen other than nasopharyngeal swab, presence of viral mutation(s) within the areas targeted by this assay, and inadequate number of viral copies(<138 copies/mL). A negative result must be combined with clinical observations, patient history, and epidemiological information. The expected result is Negative.  Fact Sheet for Patients:  EntrepreneurPulse.com.au  Fact Sheet for Healthcare Providers:  IncredibleEmployment.be  This test is no t yet approved or cleared by the [place] FDA and  has been authorized for detection and/or diagnosis of SARS-CoV-2 by FDA under an Emergency Use Authorization (EUA). This EUA will remain  in effect (meaning this test can be used) for the duration of the COVID-19 declaration under Section 564(b)(1) of the Act, 21 U.S.C.section  360bbb-3(b)(1), unless the authorization is terminated  or revoked sooner.       Influenza A by PCR NEGATIVE NEGATIVE Final  Influenza B by PCR NEGATIVE NEGATIVE Final    Comment: (NOTE) The Xpert Xpress SARS-CoV-2/FLU/RSV plus assay is intended as an aid in the diagnosis of influenza from Nasopharyngeal swab specimens and should not be used as a sole basis for treatment. Nasal washings and aspirates are unacceptable for Xpert Xpress SARS-CoV-2/FLU/RSV testing.  Fact Sheet for Patients: EntrepreneurPulse.com.au  Fact Sheet for Healthcare Providers: IncredibleEmployment.be  This test is not yet approved or cleared by the [place] FDA and has been authorized for detection and/or diagnosis of SARS-CoV-2 by FDA under an Emergency Use Authorization (EUA). This EUA will remain in effect (meaning this test can be used) for the duration of the COVID-19 declaration under Section 564(b)(1) of the Act, 21 U.S.C. section 360bbb-3(b)(1), unless the authorization is terminated or revoked.  Performed at [facility], [address], [place], [place] [address]   Culture, blood (routine x 2)     Status: Abnormal (Preliminary result)   Collection Time: 04/15/22  3:01 PM   Specimen: BLOOD RIGHT FOREARM  Result Value Ref Range Status   Specimen Description   Final    BLOOD RIGHT FOREARM Performed at [facility], [address], [place], [place] [address]    Special Requests   Final    BOTTLES DRAWN AEROBIC AND ANAEROBIC Blood Culture adequate volume Performed at [facility], [address] [place], [place] [address]    Culture  Setup Time   Final    GRAM NEGATIVE RODS BOTTLES DRAWN AEROBIC AND ANAEROBIC Gram Stain Report Called to,Read Back By and Verified With: SCHUMUTZ,K'@0440'$  BY MATTHEWS B 11.17.2023   Culture (A)  Final    ESCHERICHIA COLI SUSCEPTIBILITIES TO FOLLOW Performed at [facility], [place] [address], [place],  [address]     Report Status PENDING  Incomplete  Blood Culture ID Panel (Reflexed)     Status: Abnormal   Collection Time: 04/15/22  3:01 PM  Result Value Ref Range Status   Enterococcus faecalis NOT DETECTED NOT DETECTED Final   Enterococcus Faecium NOT DETECTED NOT DETECTED Final   Listeria monocytogenes NOT DETECTED NOT DETECTED Final   Staphylococcus species NOT DETECTED NOT DETECTED Final   Staphylococcus aureus (BCID) NOT DETECTED NOT DETECTED Final   Staphylococcus epidermidis NOT DETECTED NOT DETECTED Final   Staphylococcus lugdunensis NOT DETECTED NOT DETECTED Final   Streptococcus species NOT DETECTED NOT DETECTED Final   Streptococcus agalactiae NOT DETECTED NOT DETECTED Final   Streptococcus pneumoniae NOT DETECTED NOT DETECTED Final   Streptococcus pyogenes NOT DETECTED NOT DETECTED Final   A.calcoaceticus-baumannii NOT DETECTED NOT DETECTED Final   Bacteroides fragilis NOT DETECTED NOT DETECTED Final   Enterobacterales DETECTED (A) NOT DETECTED Final    Comment: Enterobacterales represent a large order of gram negative bacteria, not a single organism. CRITICAL RESULT CALLED TO, READ BACK BY AND VERIFIED WITH: J. FRENS PHARMD, AT 1245 04/16/22 BY D. VANHOOK    Enterobacter cloacae complex NOT DETECTED NOT DETECTED Final   Escherichia coli DETECTED (A) NOT DETECTED Final    Comment: CRITICAL RESULT CALLED TO, READ BACK BY AND VERIFIED WITH: J. FRENS PHARMD, AT 8099 04/16/22 BY D. VANHOOK    Klebsiella aerogenes NOT DETECTED NOT DETECTED Final   Klebsiella oxytoca NOT DETECTED NOT DETECTED Final   Klebsiella pneumoniae NOT DETECTED NOT DETECTED Final   Proteus species NOT DETECTED NOT DETECTED Final   Salmonella species NOT DETECTED NOT DETECTED Final   Serratia marcescens NOT DETECTED NOT DETECTED Final   Haemophilus influenzae NOT DETECTED  NOT DETECTED Final   Neisseria meningitidis NOT DETECTED NOT DETECTED Final   Pseudomonas aeruginosa NOT DETECTED NOT DETECTED Final    Stenotrophomonas maltophilia NOT DETECTED NOT DETECTED Final   Candida albicans NOT DETECTED NOT DETECTED Final   Candida auris NOT DETECTED NOT DETECTED Final   Candida glabrata NOT DETECTED NOT DETECTED Final   Candida krusei NOT DETECTED NOT DETECTED Final   Candida parapsilosis NOT DETECTED NOT DETECTED Final   Candida tropicalis NOT DETECTED NOT DETECTED Final   Cryptococcus neoformans/gattii NOT DETECTED NOT DETECTED Final   CTX-M ESBL NOT DETECTED NOT DETECTED Final   Carbapenem resistance IMP NOT DETECTED NOT DETECTED Final   Carbapenem resistance KPC NOT DETECTED NOT DETECTED Final   Carbapenem resistance NDM NOT DETECTED NOT DETECTED Final   Carbapenem resist OXA 48 LIKE NOT DETECTED NOT DETECTED Final   Carbapenem resistance VIM NOT DETECTED NOT DETECTED Final    Comment: Performed at [facility], [address] [place], [place] [address]  Culture, blood (routine x 2)     Status: None (Preliminary result)   Collection Time: 04/15/22  3:37 PM   Specimen: BLOOD  Result Value Ref Range Status   Specimen Description   Final    BLOOD BLOOD RIGHT ARM Performed at [facility], [address] [place], [place] [address]    Special Requests   Final    BOTTLES DRAWN AEROBIC AND ANAEROBIC Blood Culture adequate volume Performed at [facility], [address], [place], [place] [address]    Culture  Setup Time   Final    GRAM NEGATIVE RODS BOTTLES DRAWN AEROBIC AND ANAEROBIC Gram Stain Report Called to,Read Back By and Verified With: SCHMUTZ,K'@MOSES'$  CONE '@0440'$  BY MATTHEWS, B 11.17.2023 CRITICAL RESULT CALLED TO, READ BACK BY AND VERIFIED WITH: J. FRENS PHARMD, AT 2706 04/16/22 BY Rush [place]mark Performed at [facility], [place] [address], [place], [place] [address]    Culture GRAM NEGATIVE RODS  Final   Report Status PENDING  Incomplete  CSF culture     Status: None (Preliminary result)   Collection Time: 04/15/22  3:57 PM   Specimen: Back; Cerebrospinal Fluid  Result Value  Ref Range Status   Specimen Description   Final    BACK Performed at [facility], [address] [place], [place] [address]    Special Requests   Final    NONE Performed at [facility], [address], [place], [place] [address]    Gram Stain   Final    WBC PRESENT, PREDOMINANTLY MONONUCLEAR NO ORGANISMS SEEN    Culture   Final    NO GROWTH 2 DAYS Performed at [facility], [place] [address] [place], [place] [address]    Report Status PENDING  Incomplete  Gram stain     Status: None   Collection Time: 04/15/22  3:57 PM   Specimen: CSF; Cerebrospinal Fluid  Result Value Ref Range Status   Specimen Description CSF  Final   Special Requests NONE  Final   Gram Stain   Final    NO ORGANISMS SEEN WBC PRESENT, PREDOMINANTLY MONONUCLEAR Performed at [facility], [address], [place], [place] [address]    Report Status 04/15/2022 FINAL  Final  MRSA Next Gen by PCR, Nasal     Status: None   Collection Time: 04/15/22 10:18 PM   Specimen: Nasal Mucosa; Nasal Swab  Result Value Ref Range Status   MRSA by PCR Next Gen NOT DETECTED NOT DETECTED  Final    Comment: (NOTE) The GeneXpert MRSA Assay (FDA approved for NASAL specimens only), is one component of a comprehensive MRSA colonization surveillance program. It is not intended to diagnose MRSA infection nor to guide or monitor treatment for MRSA infections. Test performance is not FDA approved in patients less than 79 years old. Performed at [facility], [place] [address] [place], [place] [address]   Respiratory (~20 pathogens) panel by PCR     Status: None   Collection Time: 04/16/22  2:18 AM   Specimen: Nasopharyngeal Swab; Respiratory  Result Value Ref Range Status   Adenovirus NOT DETECTED NOT DETECTED Final   Coronavirus 229E NOT DETECTED NOT DETECTED Final    Comment: (NOTE) The Coronavirus on the Respiratory Panel, DOES NOT test for the novel  Coronavirus (2019 nCoV)    Coronavirus HKU1 NOT DETECTED NOT  DETECTED Final   Coronavirus NL63 NOT DETECTED NOT DETECTED Final   Coronavirus OC43 NOT DETECTED NOT DETECTED Final   Metapneumovirus NOT DETECTED NOT DETECTED Final   Rhinovirus / Enterovirus NOT DETECTED NOT DETECTED Final   Influenza A NOT DETECTED NOT DETECTED Final   Influenza B NOT DETECTED NOT DETECTED Final   Parainfluenza Virus 1 NOT DETECTED NOT DETECTED Final   Parainfluenza Virus 2 NOT DETECTED NOT DETECTED Final   Parainfluenza Virus 3 NOT DETECTED NOT DETECTED Final   Parainfluenza Virus 4 NOT DETECTED NOT DETECTED Final   Respiratory Syncytial Virus NOT DETECTED NOT DETECTED Final   Bordetella pertussis NOT DETECTED NOT DETECTED Final   Bordetella Parapertussis NOT DETECTED NOT DETECTED Final   Chlamydophila pneumoniae NOT DETECTED NOT DETECTED Final   Mycoplasma pneumoniae NOT DETECTED NOT DETECTED Final    Comment: Performed at [facility], [place]. [address] [place], [place] [address]    RADIOLOGY STUDIES/RESULTS: Overnight EEG with video  Result Date: 04/16/2022 Lora Havens, MD     04/17/2022  9:27 AM Patient Name: MAHLANI BERNINGER MRN: [id number] Epilepsy Attending: Lora Havens Referring Physician/Provider: Lorenza Chick, MD Duration: 04/16/2022 6967 to 1558 Patient history: 61yo F with ams. EEG to evaluate for seizure Level of alertness: Awake, asleep AEDs during EEG study: None Technical aspects: This EEG study was done with scalp electrodes positioned according to the 10-20 International system of electrode placement. Electrical activity was reviewed with band pass filter of 1-'70Hz'$ , sensitivity of 7 uV/mm, display speed of 36m/sec with a '60Hz'$  notched filter applied as appropriate. EEG data were recorded continuously and digitally stored.  Video monitoring was available and reviewed as appropriate. Description: The posterior dominant rhythm consists of 7 Hz activity of moderate voltage (25-35 uV) seen predominantly in posterior head regions, symmetric and  reactive to eye opening and eye closing. Sleep was characterized by vertex waves, sleep spindles (12 to 14 Hz), maximal frontocentral region. EEG showed continuous/intermittent generalized predominantly 5-7 Hz theta slowing admixed with intermittent 2-'3hz'$  delta slowing. Hyperventilation and photic stimulation were not performed.   ABNORMALITY - Continuous slow, generalized IMPRESSION: This study is suggestive of mild to moderate diffuse encephalopathy, nonspecific etiology. No seizures or epileptiform discharges were seen throughout the recording. PLora Havens  MR CERVICAL SPINE WO CONTRAST  Result Date: 04/16/2022 CLINICAL DATA:  Myelopathy, acute, cervical spine Right upper extremity weakness. EXAM: MRI CERVICAL SPINE WITHOUT CONTRAST TECHNIQUE: Multiplanar, multisequence MR imaging of the cervical spine was performed. No intravenous contrast was administered. COMPARISON:  04/23/2005 FINDINGS: Alignment: Physiologic. Vertebrae: No fracture, evidence of discitis, or bone  lesion. Cord: Normal signal and morphology. Posterior Fossa, vertebral arteries, paraspinal tissues: Negative. Disc levels: C1-2: Unremarkable. C2-3: Normal disc space and facet joints. There is no spinal canal stenosis. No neural foraminal stenosis. C3-4: Small disc bulge with bilateral uncovertebral hypertrophy. There is no spinal canal stenosis. Unchanged severe right and moderate left neural foraminal stenosis. C4-5: Normal disc space and facet joints. There is no spinal canal stenosis. No neural foraminal stenosis. C5-6: Normal disc space and facet joints. There is no spinal canal stenosis. No neural foraminal stenosis. C6-7: Normal disc space and facet joints. There is no spinal canal stenosis. No neural foraminal stenosis. C7-T1: Normal disc space and facet joints. There is no spinal canal stenosis. No neural foraminal stenosis. IMPRESSION: 1. Unchanged severe right and moderate left C3-4 neural foraminal stenosis secondary to  uncovertebral hypertrophy. 2. No spinal canal stenosis. Electronically Signed   By: Ulyses Jarred M.D.   On: 04/16/2022 02:14   MR BRAIN WO CONTRAST  Result Date: 04/16/2022 CLINICAL DATA:  New onset seizure EXAM: MRI HEAD WITHOUT CONTRAST TECHNIQUE: Multiplanar, multiecho pulse sequences of the brain and surrounding structures were obtained without intravenous contrast. COMPARISON:  None Available. FINDINGS: Brain: No acute infarct, mass effect or extra-axial collection. No acute or chronic hemorrhage. There is multifocal hyperintense T2-weighted signal within the white matter. Generalized volume loss. The midline structures are normal. Vascular: Major flow voids are preserved. Skull and upper cervical spine: Normal calvarium and skull base. Visualized upper cervical spine and soft tissues are normal. Sinuses/Orbits:No paranasal sinus fluid levels or advanced mucosal thickening. No mastoid or middle ear effusion. Normal orbits. IMPRESSION: 1. No acute intracranial abnormality. 2. Findings of chronic small vessel ischemia and volume loss. Electronically Signed   By: Ulyses Jarred M.D.   On: 04/16/2022 02:09   CT CHEST ABDOMEN PELVIS WO CONTRAST  Result Date: 04/15/2022 CLINICAL DATA:  Seizure activity today, possible sepsis EXAM: CT CHEST, ABDOMEN AND PELVIS WITHOUT CONTRAST TECHNIQUE: Multidetector CT imaging of the chest, abdomen and pelvis was performed following the standard protocol without IV contrast. RADIATION DOSE REDUCTION: This exam was performed according to the departmental dose-optimization program which includes automated exposure control, adjustment of the mA and/or kV according to patient size and/or use of iterative reconstruction technique. COMPARISON:  Multiple priors including CT November 10, 2021 FINDINGS: CT CHEST FINDINGS Cardiovascular: Aortic atherosclerosis. Enlarged main pulmonary artery measures 3.2 cm. Cardiac enlargement. Coronary artery calcifications. Mediastinum/Nodes:  Partially calcified 9 mm right thyroid nodule. Not clinically significant; no follow-up imaging recommended (ref: J Am Coll Radiol. 2015 Feb;12(2): 143-50).No pathologically enlarged mediastinal, hilar or axillary lymph nodes, noting limited sensitivity for the detection of hilar adenopathy on this noncontrast study. Patulous esophagus with retained/refluxed contents in the esophagus. Lungs/Pleura: Patchy bilateral ground-glass opacities with multifocal linear/banded consolidative opacities likely reflect a combination of atelectasis and an infectious or inflammatory process. No pleural effusion. No pneumothorax. Musculoskeletal: Prior median sternotomy. No acute osseous abnormality. CT ABDOMEN PELVIS FINDINGS Hepatobiliary: Unremarkable noncontrast enhanced appearance of the hepatic parenchyma. Gallbladder is distended. No biliary ductal dilation. Pancreas: No pancreatic ductal dilation or evidence of acute inflammation. Spleen: No splenomegaly. Adrenals/Urinary Tract: Similar thickening of the bilateral adrenal glands without discrete nodularity, favored benign hyperplasia and requiring no follow-up. Perinephric and Peri pelvic stranding. Nonobstructive 9 mm left upper pole renal stone. Hydronephrosis. Gas in the urinary bladder. Stomach/Bowel: Stomach is minimally distended limiting evaluation. No pathologic dilation of small or large bowel. Normal appendix. Moderate volume of formed stool throughout the colon.  Mild rectal and sigmoid colon wall thickening for instance on 105/2. Vascular/Lymphatic: Aortic atherosclerosis. No pathologically enlarged abdominal or pelvic lymph nodes. Reproductive: Status post hysterectomy. No adnexal masses. Other: Mild nonspecific body wall edema. No significant abdominopelvic free fluid. No walled off fluid collections. Musculoskeletal: No acute osseous abnormality. IMPRESSION: 1. Patchy bilateral ground-glass opacities with multifocal linear/banded consolidative opacities likely  reflect a combination of atelectasis and an infectious or inflammatory process. 2. Mild rectal and sigmoid colon wall thickening, which may reflect a nonspecific proctocolitis. 3. Mild perinephric and peripelvic stranding with a nonobstructive 9 mm left upper pole renal stone. Suggest correlation with laboratory values for infection. 4. Gas in the urinary bladder, correlate for recent instrumentation. 5. Moderate volume of formed stool throughout the colon. Correlate for constipation. 6. Enlarged main pulmonary artery, which can be seen in the setting of pulmonary arterial hypertension. 7.  Aortic Atherosclerosis (ICD10-I70.0). Electronically Signed   By: Dahlia Bailiff M.D.   On: 04/15/2022 18:48   CT Head Wo Contrast  Result Date: 04/15/2022 CLINICAL DATA:  Seizure. EXAM: CT HEAD WITHOUT CONTRAST TECHNIQUE: Contiguous axial images were obtained from the base of the skull through the vertex without intravenous contrast. RADIATION DOSE REDUCTION: This exam was performed according to the departmental dose-optimization program which includes automated exposure control, adjustment of the mA and/or kV according to patient size and/or use of iterative reconstruction technique. COMPARISON:  November 10, 2021. FINDINGS: Brain: No evidence of acute infarction, hemorrhage, hydrocephalus, extra-axial collection or mass lesion/mass effect. Vascular: No hyperdense vessel or unexpected calcification. Skull: Normal. Negative for fracture or focal lesion. Sinuses/Orbits: No acute finding. Other: None. IMPRESSION: No acute intracranial abnormality seen. Electronically Signed   By: Marijo Conception M.D.   On: 04/15/2022 15:21   DG Chest Port 1 View  Result Date: 04/15/2022 CLINICAL DATA:  Seizure EXAM: PORTABLE CHEST 1 VIEW COMPARISON:  11/10/2021 FINDINGS: Single frontal view of the chest demonstrates a stable cardiac silhouette. Postsurgical changes from median sternotomy. No airspace disease, effusion, or pneumothorax. No  acute bony abnormalities. IMPRESSION: 1. No acute intrathoracic process. Electronically Signed   By: Randa Ngo M.D.   On: 04/15/2022 15:05     LOS: 2 days   Oren Binet, MD  [facility]    To contact the attending provider between 7A-7P or the covering provider during after hours 7P-7A, please log into the web site [email or web address] and access using universal [place] password for that web site. If you do not have the password, please call the hospital operator.  04/17/2022, 10:12 AM   Not yet back to baseline avoid nephrotoxic agents

## 2022-04-17 NOTE — Progress Notes (Signed)
  Echocardiogram 2D Echocardiogram has been performed.  Anita [name] 04/17/2022, 3:21 PM

## 2022-04-18 DIAGNOSIS — R7881 Bacteremia: Secondary | ICD-10-CM | POA: Diagnosis not present

## 2022-04-18 DIAGNOSIS — N10 Acute tubulo-interstitial nephritis: Secondary | ICD-10-CM | POA: Diagnosis not present

## 2022-04-18 DIAGNOSIS — B9689 Other specified bacterial agents as the cause of diseases classified elsewhere: Secondary | ICD-10-CM

## 2022-04-18 DIAGNOSIS — B9620 Unspecified Escherichia coli [E. coli] as the cause of diseases classified elsewhere: Secondary | ICD-10-CM

## 2022-04-18 DIAGNOSIS — G9341 Metabolic encephalopathy: Secondary | ICD-10-CM | POA: Diagnosis not present

## 2022-04-18 LAB — CBC WITH DIFFERENTIAL/PLATELET
Abs Immature Granulocytes: 0.06 10*3/uL (ref 0.00–0.07)
Basophils Absolute: 0 10*3/uL (ref 0.0–0.1)
Basophils Relative: 0 %
Eosinophils Absolute: 0 10*3/uL (ref 0.0–0.5)
Eosinophils Relative: 0 %
HCT: 33.9 % — ABNORMAL LOW (ref 36.0–46.0)
Hemoglobin: 11 g/dL — ABNORMAL LOW (ref 12.0–15.0)
Immature Granulocytes: 1 %
Lymphocytes Relative: 8 %
Lymphs Abs: 0.6 10*3/uL — ABNORMAL LOW (ref 0.7–4.0)
MCH: 25.6 pg — ABNORMAL LOW (ref 26.0–34.0)
MCHC: 32.4 g/dL (ref 30.0–36.0)
MCV: 79 fL — ABNORMAL LOW (ref 80.0–100.0)
Monocytes Absolute: 0.8 10*3/uL (ref 0.1–1.0)
Monocytes Relative: 10 %
Neutro Abs: 6.2 10*3/uL (ref 1.7–7.7)
Neutrophils Relative %: 81 %
Platelets: 69 10*3/uL — ABNORMAL LOW (ref 150–400)
RBC: 4.29 MIL/uL (ref 3.87–5.11)
RDW: 16.2 % — ABNORMAL HIGH (ref 11.5–15.5)
WBC: 7.7 10*3/uL (ref 4.0–10.5)
nRBC: 0 % (ref 0.0–0.2)

## 2022-04-18 LAB — GLUCOSE, CAPILLARY
Glucose-Capillary: 150 mg/dL — ABNORMAL HIGH (ref 70–99)
Glucose-Capillary: 184 mg/dL — ABNORMAL HIGH (ref 70–99)
Glucose-Capillary: 231 mg/dL — ABNORMAL HIGH (ref 70–99)
Glucose-Capillary: 336 mg/dL — ABNORMAL HIGH (ref 70–99)

## 2022-04-18 LAB — COMPREHENSIVE METABOLIC PANEL
ALT: 54 U/L — ABNORMAL HIGH (ref 0–44)
AST: 74 U/L — ABNORMAL HIGH (ref 15–41)
Albumin: 2.6 g/dL — ABNORMAL LOW (ref 3.5–5.0)
Alkaline Phosphatase: 118 U/L (ref 38–126)
Anion gap: 12 (ref 5–15)
BUN: 38 mg/dL — ABNORMAL HIGH (ref 8–23)
CO2: 25 mmol/L (ref 22–32)
Calcium: 9.1 mg/dL (ref 8.9–10.3)
Chloride: 103 mmol/L (ref 98–111)
Creatinine, Ser: 1.69 mg/dL — ABNORMAL HIGH (ref 0.44–1.00)
GFR, Estimated: 34 mL/min — ABNORMAL LOW (ref >60)
Glucose, Bld: 149 mg/dL — ABNORMAL HIGH (ref 70–99)
Potassium: 3.3 mmol/L — ABNORMAL LOW (ref 3.5–5.1)
Sodium: 140 mmol/L (ref 135–145)
Total Bilirubin: 1.1 mg/dL (ref 0.3–1.2)
Total Protein: 6.1 g/dL — ABNORMAL LOW (ref 6.5–8.1)

## 2022-04-18 LAB — CK: Total CK: 591 U/L — ABNORMAL HIGH (ref 38–234)

## 2022-04-18 LAB — CULTURE, BLOOD (ROUTINE X 2)
Special Requests: ADEQUATE
Special Requests: ADEQUATE

## 2022-04-18 LAB — MAGNESIUM: Magnesium: 1.7 mg/dL (ref 1.7–2.4)

## 2022-04-18 LAB — HSV 1/2 PCR, CSF
HSV-1 DNA: NEGATIVE
HSV-2 DNA: NEGATIVE

## 2022-04-18 MED ORDER — INSULIN ASPART 100 UNIT/ML IJ SOLN: 0.0000 [IU] | Freq: Three times a day (TID) | INTRAMUSCULAR | Status: DC

## 2022-04-18 MED ORDER — INSULIN ASPART 100 UNIT/ML IJ SOLN
0.0000 [IU] | Freq: Every day | INTRAMUSCULAR | Status: DC
  Administered 2022-04-18: 4 [IU] via SUBCUTANEOUS
  Administered 2022-04-19: 3 [IU] via SUBCUTANEOUS

## 2022-04-18 MED ORDER — GABAPENTIN 300 MG PO CAPS
300.0000 mg | ORAL_CAPSULE | Freq: Two times a day (BID) | ORAL | Status: DC
  Administered 2022-04-19 – 2022-04-20 (×3): 300 mg via ORAL
  Filled 2022-04-18 (×3): qty 1

## 2022-04-18 MED ORDER — POTASSIUM CHLORIDE CRYS ER 20 MEQ PO TBCR
40.0000 meq | EXTENDED_RELEASE_TABLET | Freq: Once | ORAL | Status: AC
  Administered 2022-04-18: 40 meq via ORAL
  Filled 2022-04-18: qty 2

## 2022-04-18 NOTE — Evaluation (Signed)
Occupational Therapy Evaluation Patient Details Name: Anita [name] MRN: [id number] DOB: 1961/03/07 Today's Date: 04/18/2022   History of Present Illness Patient is a 61 y.o.  female with history of CAD, lacunar stroke, HTN, DM-2, CKD stage IIIa, hypothyroidism, chronic pain-presented with acute metabolic encephalopathy/seizure-like activity-upon further evaluation-she was found to have pyelonephritis with gram-negative bacteremia.   Clinical Impression   Mrs. Anita [name] is a 61 year old woman admitted to hospital with above medical history. On evaluation she was supervision for bed transfers and min guard for ambulation in room with walker. She was able to stand at sink to wash her face and brush her teeth - for over 5 minutes. She didn't exhibit any overt loss of balance with ambulation but she was mildly unsteady when she demonstrated ability to don socks on edge of bed. Overall she is min guard to min assist for ADLs at this time. Patient will benefit from skilled OT services while in hospital to improve deficits and learn compensatory strategies as needed in order to return to PLOF.  Patient's preference is to discharge home. Recommend HH OT and PT at discharge.      Recommendations for follow up therapy are one component of a multi-disciplinary discharge planning process, led by the attending physician.  Recommendations may be updated based on patient status, additional functional criteria and insurance authorization.   Follow Up Recommendations  Home health OT     Assistance Recommended at Discharge Intermittent Supervision/Assistance  Patient can return home with the following A little help with walking and/or transfers;A little help with bathing/dressing/bathroom;Assistance with cooking/housework;Help with stairs or ramp for entrance    Functional Status Assessment  Patient has had a recent decline in their functional status and demonstrates the ability to make significant improvements  in function in a reasonable and predictable amount of time.  Equipment Recommendations  None recommended by OT    Recommendations for Other Services       Precautions / Restrictions Precautions Precautions: Fall Precaution Comments: hx of falls Restrictions Weight Bearing Restrictions: No      Mobility Bed Mobility Overal bed mobility: Needs Assistance Bed Mobility: Supine to Sit, Sit to Supine     Supine to sit: Supervision, HOB elevated Sit to supine: Supervision, HOB elevated   General bed mobility comments: Increased time and use of bed rail to perform bed transfers    Transfers Overall transfer level: Needs assistance Equipment used: Rolling walker (2 wheels) Transfers: Sit to/from Stand, Bed to chair/wheelchair/BSC Sit to Stand: Min guard           General transfer comment: Min guard to stand, ambulate with walker, stand at sink. No overt loss of balance      Balance Overall balance assessment: History of Falls, Mild deficits observed, not formally tested                                         ADL either performed or assessed with clinical judgement   ADL Overall ADL's : Needs assistance/impaired Eating/Feeding: Independent   Grooming: Standing;Oral care;Wash/dry face Grooming Details (indicate cue type and reason): stood at sink to brush teeth and wash face Upper Body Bathing: Set up;Sitting   Lower Body Bathing: Minimal assistance;Sit to/from stand   Upper Body Dressing : Set up;Sitting   Lower Body Dressing: Minimal assistance;Sit to/from stand   Toilet Transfer: Min guard;Rolling walker (2 wheels);Regular  Museum/gallery exhibitions officer and Hygiene: Minimal assistance;Sit to/from stand       Functional mobility during ADLs: Min guard;Rolling walker (2 wheels) General ADL Comments: MIn guard to ambulate in room with walker. No overt loss of balance     Vision   Vision Assessment?: No apparent visual deficits      Perception     Praxis      Pertinent Vitals/Pain Pain Assessment Pain Assessment: No/denies pain     Hand Dominance Right   Extremity/Trunk Assessment Upper Extremity Assessment Upper Extremity Assessment: Overall WFL for tasks assessed   Lower Extremity Assessment Lower Extremity Assessment: Defer to PT evaluation   Cervical / Trunk Assessment Cervical / Trunk Assessment: Normal   Communication Communication Communication: No difficulties   Cognition Arousal/Alertness: Awake/alert Behavior During Therapy: WFL for tasks assessed/performed Overall Cognitive Status: Within Functional Limits for tasks assessed                                 General Comments: Patient alert to self, place, month, year, President and able to answer all PLOF questions appropriately. Wasn't aware of the exact date.     General Comments       Exercises     Shoulder Instructions      Home Living Family/patient expects to be discharged to:: Private residence Living Arrangements: Spouse/significant other Available Help at Discharge: Family;Available 24 hours/day Type of Home: Mobile home Home Access: Stairs to enter Entrance Stairs-Number of Steps: 3 Entrance Stairs-Rails: Right;Left Home Layout: One level     Bathroom Shower/Tub: Tub/shower unit;Walk-in shower   Bathroom Toilet: Standard     Home Equipment: Conservation officer, nature (2 wheels);Cane - single point;BSC/3in1;Shower seat   Additional Comments: Husband is missing toes/foot - doesn't mobilize much now      Prior Functioning/Environment Prior Level of Function : Independent/Modified Independent             Mobility Comments: reports mostly using a cane but RW sometimes ADLs Comments: independent, doesn't drive, was cooking and  cleaning and taking care of her husband        OT Problem List: Decreased strength;Decreased activity tolerance;Impaired balance (sitting and/or standing);Decreased knowledge  of use of DME or AE      OT Treatment/Interventions: Self-care/ADL training;DME and/or AE instruction;Therapeutic activities;Balance training;Patient/family education    OT Goals(Current goals can be found in the care plan section) Acute Rehab OT Goals Patient Stated Goal: to go home OT Goal Formulation: With patient Time For Goal Achievement: 05/02/22 Potential to Achieve Goals: Good  OT Frequency: Min 2X/week    Co-evaluation              AM-PAC OT "6 Clicks" Daily Activity     Outcome Measure Help from another person eating meals?: None Help from another person taking care of personal grooming?: A Little Help from another person toileting, which includes using toliet, bedpan, or urinal?: A Little Help from another person bathing (including washing, rinsing, drying)?: A Little Help from another person to put on and taking off regular upper body clothing?: A Little Help from another person to put on and taking off regular lower body clothing?: A Little 6 Click Score: 19   End of Session Equipment Utilized During Treatment: Gait belt;Rolling walker (2 wheels) Nurse Communication: Mobility status  Activity Tolerance: Patient tolerated treatment well Patient left: in bed;with call bell/phone within reach;with bed alarm set  OT  Visit Diagnosis: Other symptoms and signs involving cognitive function                Time: 0352-4818 OT Time Calculation (min): 32 min Charges:  OT General Charges $OT Visit: 1 Visit OT Treatments $Self Care/Home Management : 8-22 mins  Gustavo Lah, OTR/L [place]  Office [phone]   Lenward Chancellor 04/18/2022, 2:37 PM

## 2022-04-18 NOTE — Evaluation (Signed)
Physical Therapy Evaluation Patient Details Name: Anita [name] MRN: [id number] DOB: June 18, 1960 Today's Date: 04/18/2022  History of Present Illness  Patient is a 61 y.o.  female with history of CAD, lacunar stroke, HTN, DM-2, CKD stage IIIa, hypothyroidism, chronic pain-presented with acute metabolic encephalopathy/seizure-like activity-upon further evaluation-she was found to have pyelonephritis with gram-negative bacteremia.  Clinical Impression   Pt admitted with above diagnosis. Lives at home with husband (with recent metatarsal amputation and limited mobility), in a single-level home with 3-5 steps to enter; Prior to admission, pt was able to manage independently with rW for amb; Presents to PT with generalized weakness, but good recovery and medical progress; Able to walk in the hallway with RW and minguard assist;  Pt currently with functional limitations due to the deficits listed below (see PT Problem List). Pt will benefit from skilled PT to increase their independence and safety with mobility to allow discharge to the venue listed below.          Recommendations for follow up therapy are one component of a multi-disciplinary discharge planning process, led by the attending physician.  Recommendations may be updated based on patient status, additional functional criteria and insurance authorization.  Follow Up Recommendations Home health PT      Assistance Recommended at Discharge Set up Supervision/Assistance  Patient can return home with the following  Assistance with cooking/housework;Assist for transportation    Equipment Recommendations BSC/3in1  Recommendations for Other Services       Functional Status Assessment Patient has had a recent decline in their functional status and demonstrates the ability to make significant improvements in function in a reasonable and predictable amount of time.     Precautions / Restrictions Precautions Precautions: Fall Precaution  Comments: hx of falls Restrictions Weight Bearing Restrictions: No      Mobility  Bed Mobility Overal bed mobility: Needs Assistance Bed Mobility: Supine to Sit     Supine to sit: Supervision, HOB elevated     General bed mobility comments: Increased time and use of bed rail to perform bed transfers    Transfers Overall transfer level: Needs assistance Equipment used: Rolling walker (2 wheels) Transfers: Sit to/from Stand Sit to Stand: Min guard           General transfer comment: Min guard to stand, ambulate with walker, stand at sink. No overt loss of balance    Ambulation/Gait Ambulation/Gait assistance: Min guard Gait Distance (Feet): 220 Feet Assistive device: Rolling walker (2 wheels) Gait Pattern/deviations: Step-through pattern, Decreased step length - right, Decreased step length - left Gait velocity: slwoed     General Gait Details: Cues to self-monitor for actiivty tolerance  Stairs            Wheelchair Mobility    Modified [place] (Stroke Patients Only)       Balance Overall balance assessment: History of Falls, Mild deficits observed, not formally tested                                           Pertinent Vitals/Pain Pain Assessment Pain Assessment: No/denies pain    Home Living Family/patient expects to be discharged to:: Private residence Living Arrangements: Spouse/significant other;Children Available Help at Discharge: Family;Available 24 hours/day Type of Home: Mobile home Home Access: Stairs to enter Entrance Stairs-Rails: Right;Left;Can reach both Entrance Stairs-Number of Steps: 3   Home Layout: One level Home  Equipment: Conservation officer, nature (2 wheels);Cane - single point;BSC/3in1;Shower seat Additional Comments: Husband is missing toes/foot - doesn't mobilize much now    Prior Function Prior Level of Function : Independent/Modified Independent             Mobility Comments: reports mostly using a  cane but RW sometimes ADLs Comments: independent, doesn't drive, was cooking and  cleaning and taking care of her husband     Hand Dominance   Dominant Hand: Right    Extremity/Trunk Assessment   Upper Extremity Assessment Upper Extremity Assessment: Defer to OT evaluation    Lower Extremity Assessment Lower Extremity Assessment: Overall WFL for tasks assessed;Generalized weakness    Cervical / Trunk Assessment Cervical / Trunk Assessment: Normal  Communication   Communication: No difficulties  Cognition Arousal/Alertness: Awake/alert Behavior During Therapy: WFL for tasks assessed/performed Overall Cognitive Status: Within Functional Limits for tasks assessed                                          General Comments General comments (skin integrity, edema, etc.): Session conducted on room air and NAD noted; had a large formed BM    Exercises     Assessment/Plan    PT Assessment Patient needs continued PT services  PT Problem List Decreased strength;Decreased activity tolerance;Decreased balance;Decreased mobility;Decreased coordination;Decreased cognition;Decreased knowledge of use of DME;Decreased safety awareness       PT Treatment Interventions DME instruction;Gait training;Stair training;Functional mobility training;Therapeutic activities;Therapeutic exercise;Balance training;Patient/family education    PT Goals (Current goals can be found in the Care Plan section)  Acute Rehab PT Goals Patient Stated Goal: Hopes to get home soon PT Goal Formulation: With patient Time For Goal Achievement: 05/02/22 Potential to Achieve Goals: Good    Frequency Min 3X/week     Co-evaluation               AM-PAC PT "6 Clicks" Mobility  Outcome Measure Help needed turning from your back to your side while in a flat bed without using bedrails?: None Help needed moving from lying on your back to sitting on the side of a flat bed without using  bedrails?: None Help needed moving to and from a bed to a chair (including a wheelchair)?: A Little Help needed standing up from a chair using your arms (e.g., wheelchair or bedside chair)?: A Little Help needed to walk in hospital room?: A Little Help needed climbing 3-5 steps with a railing? : A Lot 6 Click Score: 19    End of Session Equipment Utilized During Treatment: Gait belt Activity Tolerance: Patient tolerated treatment well Patient left: in chair;with call bell/phone within reach;with chair alarm set Nurse Communication: Mobility status PT Visit Diagnosis: Unsteadiness on feet (R26.81);Other abnormalities of gait and mobility (R26.89);Muscle weakness (generalized) (M62.81)    Time: 1430-1500 (in and out times are approximate) PT Time Calculation (min) (ACUTE ONLY): 30 min   Charges:   PT Evaluation $PT Eval Low Complexity: 1 Low PT Treatments $Gait Training: 8-22 mins        Roney Marion, PT  Acute Rehabilitation Services Office [place] 04/18/2022, 4:32 PM

## 2022-04-18 NOTE — TOC Progression Note (Signed)
Transition of Care [facility]) - Progression Note    Patient Details  Name: Anita [name] MRN: [id number] Date of Birth: Sep 22, 1960  [facility] CM/SW Contact  Bartholomew Crews, RN Phone Number: [phone] 04/18/2022, 4:46 PM  Clinical Narrative:     Recommendations for [facility] PT/OT. [facility] able to accept for PT, OT - Will need HH PT, OT Face to Face orders.   Agencies unable to accept: [facility]  Voicemail left for patient's daughter, Nira Conn, to advise of accepting agency.   Expected Discharge Plan: [place] Barriers to Discharge: Continued Medical Work up  Expected Discharge Plan and Services Expected Discharge Plan: [place]   Discharge Planning Services: CM Consult   Living arrangements for the past 2 months: Single Family Home                 DME Arranged: N/A DME Agency: NA       HH Arranged: PT, OT [place] Agency: [facility] (now known [facility]) Date HH Agency Contacted: 04/18/22 Time [place]: [place] Representative spoke with at [place]: [place] Determinants of Health ([place]) Interventions    Readmission Risk Interventions     No data to display

## 2022-04-18 NOTE — TOC Progression Note (Addendum)
Transition of Care [facility]) - Progression Note    Patient Details  Name: Anita [name] MRN: [id number] Date of Birth: December 17, 1960  Transition of [facility]) CM/SW Contact  Bartholomew Crews, RN Phone Number: [phone] 04/18/2022, 2:25 PM  Clinical Narrative:     Acknowledging TOC consult to call daughter to discuss concerns about patient's living situation. Spoke with patient on hospital room phone - received verbal consent to speak with her daughter. Patient stated that she is feeling much better and is hoping to be able to go home today when her daughter comes to visit. Attempt to call daughter, left voice mail with [facility] contact info. TOC following for transition needs.   UPDATE: Received call back from [facility] to discuss community resources to help her to help her mom. Nira Conn is willing and able to help her mom and to even move them in with her, but her mom is resistant. Nira Conn did recently ask her mom about ALF and patient did say she would consider.     Barriers to Discharge: Continued Medical Work up  Expected Discharge Plan and Services     Discharge Planning Services: CM Consult   Living arrangements for the past 2 months: Single Family Home                                       Social Determinants of Health (SDOH) Interventions    Readmission Risk Interventions     No data to display

## 2022-04-18 NOTE — Progress Notes (Addendum)
PROGRESS NOTE        PATIENT DETAILS Name: Anita [name] Age: 61 y.o. Sex: female Date of Birth: 07-19-1960 Admit Date: 04/15/2022 Admitting Physician Brand Males, MD JSH:FWYOV, Purcell Nails, MD  Brief Summary: Patient is a 61 y.o. 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 coli pyelonephritis with bacteremia.  See below for further details.  Significant events: 11/16>> admit to ICU-severe encephalopathy-suspect possible seizures-fever. 11/17>> brief A-fib with RVR-spontaneously converted to sinus rhythm. 11/18>> transfer to [facility].  Significant studies: 11/16>> CSF protein 44, WBC 2 (in tube 4) 11/16>> CXR: No PNA 11/16>> CT head: No acute abnormality 11/16>> CT chest/abdomen/pelvis: Bilateral patchy groundglass opacities-likely atelectasis.  Mild perinephric/peripelvic stranding with a nonobstructive 9 mm stone in the left kidney.  Gas in the urinary bladder. 11/17>> MRI brain: No acute intracranial abnormality. 11/17>> LTM EEG: No seizures.  Significant microbiology data: 11/16>> COVID/influenza PCR: Negative 11/16>> blood culture: E. coli-sensitive to ceftriaxone and so also urine culture E. coli. 11/16>> CSF culture: Negative  Procedures: 11/16>> lumbar puncture.  Consults: Neurology PCCM  Subjective: Reports that overall she is feeling much better and almost back to her baseline.  Denies any complaints.  As per nursing, had some visual hallucinations maybe yesterday or day before (seeing bats in the room, seeing spouse in the room when he was in there).  No hallucinations today.  As per nursing, yesterday could  not stand or weight-bear.  Objective: Vitals:  Vitals:   04/17/22 1724 04/17/22 2043 04/18/22 0429 04/18/22 0940  BP: (!) 154/66 (!) 173/71 (!) 173/80 (!) 180/81  Pulse: 73 75 69 71  Resp:  '17 18 16  '$ Temp:  98.5 F (36.9 C) 98.4 F (36.9 C) 98.3 F (36.8 C)  TempSrc:      SpO2: 99% 98% 97% 100%  Weight:      Height:        Exam: General exam: Middle-age female, looks older than stated age, lying comfortably supine in bed without distress.  Oral mucosa moist. Respiratory system: Clear to auscultation. Respiratory effort normal. Cardiovascular system: S1 & S2 heard, RRR. No JVD, murmurs, rubs, gallops or clicks. No pedal edema.  Telemetry personally reviewed: Sinus rhythm. Gastrointestinal system: Abdomen is nondistended, soft and nontender. No organomegaly or masses felt. Normal bowel sounds heard. Central nervous system: Alert and oriented to person, place and partly to time, April 01, 2022. No focal neurological deficits. Extremities: Symmetric 5 x 5 power. Skin: No rashes, lesions or ulcers Psychiatry: Judgement and insight appear normal. Mood & affect appropriate.    Pertinent Labs/Radiology:   Potassium 3.3, magnesium 1.7, BUN 38, creatinine 1.69 (down from 2.14), AST 74 and ALT 54. Hemoglobin up from 9.9-11.  Platelet count stable 63 > 69.   Assessment/Plan: Severe sepsis (POA) E. coli left-sided pyelonephritis and bacteremia Sepsis physiology is resolved Continue IV Rocephin, day 4 today.  Continue treatment.  E. coli sensitive to ceftriaxone.  Consider total 7 days treatment.  After tomorrow's dose, could transition to oral.  Acute metabolic encephalopathy Myoclonus CSF inconsistent with meningitis/encephalitis MRI brain/EEG negative-felt to have myoclonus from encephalopathy and  not seizures. Felt to have severe encephalopathy/myoclonus in the setting of sepsis Encephalopathy significantly improved-almost back to baseline No myoclonus observed today. Prior  TRH MD discussed with Dr. Delsa Grana further work-up/no AEDs needed Patient has no driver's license and does not drive. On high-dose IV thiamine and IM B12 shots.  AKI on CKD stage IIIa AKI due to sepsis/obstructive uropathy Creatinine improving, but not back to baseline Avoid nephrotoxic agents Voiding trial over the next few days-continue Foley catheter.  Will attempt voiding trial first thing in the morning. Last known creatinine prior to this admission was 1.08 on 11/13/2021.  Presented with creatinine of 4.08 which is gradually improved and is down to 1.69. Trend daily BMP.  Acute urinary retention Per nursing staff-required numerous in/out cath-Foley finally replaced 11/17. Continue bethanechol for another day Voiding trial to be attempted 11/20 morning Mobilize with PT/OT, have apparently seen but they have noted still pending so recommendations not known yet.  Rhabdomyolysis Likely due to myoclonus No evidence of seizures No evidence of trauma CK 7680 on 11/16. CK has not checked this morning, will add to a.m. labs.  Transaminitis Mild Either from rhabdomyolysis/shock liver Supportive care-follow LFTs, improving  PAF with RVR Brief episode of RVR 11/18 evening versus nonsustained SVT. Spontaneously converted to sinus rhythm Continue beta-blocker Given brief episode-ongoing thrombocytopenia-hold off on anticoagulation (CHA2DS2-VASc almost 5) TTE 11/18: Normal LVEF. Reviewed prior cardiology note April 2022-patient has had prior history of palpitations-Holter monitor only showed atrial tachycardia without any A-fib.  Normocytic anemia Due to critical illness No evidence of blood loss Follow CBC, stable. Transfuse if<7  Thrombocytopenia Due to sepsis/gram-negative bacteremia Follow for now, remains stable in the 60s. See above regarding concerns to start anticoagulation for A-fib.  History of ASD repair November 1988  Nonobstructive CAD-on LHC  2012 Currently no  anginal symptoms  History of lacunar infarct Per prior cardiology note Unclear if this was asymptomatic-as patient does not remember having a CVA.  HTN BP fluctuating but mostly on the higher side Continue amlodipine, Imdur, metoprolol.  Mostly systolic hypertension.  Hesitant to start HCTZ due to AKI. Stop IVF and monitor BMP As needed IV hydralazine  DM-2 (A1c 5.2 on 6/14) CBG stable with SSI Repeat A1c, results pending. Resume oral hypoglycemic agents on discharge-but may need to adjust depending on A1c  HLD Continue statin-watch LFTs  Hypothyroidism Continue Synthroid TSH suppressed on 11/16 but this could be euthyroid syndrome Repeat TSH in several weeks as an outpatient.  Chronic pain syndrome Since encephalopathy improving-we will resume oxycodone/Neurontin but significant lower doses than usual. No AMS today.  Anxiety Apparently on chronic benzos at home-we will resume low-dose At risk for withdrawal seizures if benzos held for prolonged.   No AMS to date.  Debility/deconditioning Due to acute illness PT/OT eval-input pending.  Pressure Ulcer: Pressure Injury 04/15/22 Sacrum Mid Deep Tissue Pressure Injury - Purple or maroon localized area of discolored intact skin or blood-filled blister due to damage of underlying soft tissue from pressure and/or shear. Blanchable redness around 2 cm DPI to  (Active)  04/15/22 1600  Location: Sacrum  Location Orientation: Mid  Staging: Deep Tissue Pressure Injury - Purple or maroon localized area of discolored intact skin or blood-filled blister due to damage of underlying soft tissue from pressure and/or shear.  Wound Description (Comments): Blanchable redness around 2 cm DPI to sacruum  Present on Admission: Yes  Dressing Type Foam - Lift dressing to assess site every shift 04/18/22 1100    BMI: Estimated  body mass index is 21.35 kg/m as calculated from the following:   Height as of this encounter: '5\' 5"'$  (1.651 m).    Weight as of this encounter: 58.2 kg.   Code status:   Code Status: Full Code   DVT Prophylaxis: heparin injection 5,000 Units Start: 04/16/22 1100    Family Communication:  Daughter-Heather [phone] -11/18.  None at bedside today.   Disposition Plan: Status is: Inpatient Remains inpatient appropriate because: Resolving sepsis physiology-noted stable for discharge.   Planned Discharge Destination:Home health vs SNF   Diet: Diet Order             Diet Heart Room service appropriate? Yes; Fluid consistency: Thin  Diet effective now                     Antimicrobial agents: Anti-infectives (From admission, onward)    Start     Dose/Rate Route Frequency Ordered Stop   04/17/22 0615  cefTRIAXone (ROCEPHIN) 2 g in sodium chloride 0.9 % 100 mL IVPB        2 g 200 mL/hr over 30 Minutes Intravenous Every 24 hours 04/16/22 0843     04/17/22 0603  cefTRIAXone (ROCEPHIN) 2 g in sodium chloride 0.9 % 100 mL IVPB  Status:  Discontinued        2 g 200 mL/hr over 30 Minutes Intravenous Every 24 hours 04/16/22 0838 04/16/22 0842   04/16/22 0600  cefTRIAXone (ROCEPHIN) 2 g in sodium chloride 0.9 % 100 mL IVPB  Status:  Discontinued        2 g 200 mL/hr over 30 Minutes Intravenous Every 12 hours 04/16/22 0124 04/16/22 0838   04/15/22 1639  vancomycin variable dose per unstable renal function (pharmacist dosing)  Status:  Discontinued         Does not apply See admin instructions 04/15/22 1639 04/16/22 0836   04/15/22 1530  vancomycin (VANCOCIN) IVPB 1000 mg/200 mL premix        1,000 mg 200 mL/hr over 60 Minutes Intravenous  Once 04/15/22 1517 04/15/22 1710   04/15/22 1515  cefTRIAXone (ROCEPHIN) 2 g in sodium chloride 0.9 % 100 mL IVPB        2 g 200 mL/hr over 30 Minutes Intravenous  Once 04/15/22 1514 04/15/22 1605        MEDICATIONS: Scheduled Meds:  ALPRAZolam  0.25 mg Oral BID   amLODipine  10 mg Oral Daily   aspirin EC  81 mg Oral Daily   atorvastatin  20 mg  Oral Daily   bethanechol  10 mg Oral TID   Chlorhexidine Gluconate Cloth  6 each Topical Daily   cyanocobalamin  1,000 mcg Subcutaneous Daily   gabapentin  300 mg Oral BID   heparin injection (subcutaneous)  5,000 Units Subcutaneous Q8H   insulin aspart  0-9 Units Subcutaneous TID WC   isosorbide mononitrate  30 mg Oral Daily   levothyroxine  25 mcg Oral Q0600   metoprolol tartrate  100 mg Oral BID   nicotine  21 mg Transdermal Daily   polyethylene glycol  17 g Oral BID   senna-docusate  1 tablet Oral BID   sodium chloride flush  3 mL Intravenous Q12H   [START ON 04/19/2022] thiamine  100 mg Oral Daily   Continuous Infusions:  sodium chloride Stopped (04/16/22 1029)   cefTRIAXone (ROCEPHIN)  IV 2 g (04/18/22 7341)   lactated ringers 50 mL/hr at 04/17/22 2338   thiamine (VITAMIN B1) injection 500 mg (04/18/22  1458)   PRN Meds:.sodium chloride, docusate sodium, hydrALAZINE, oxyCODONE, polyethylene glycol   I have personally reviewed following labs and imaging studies  LABORATORY DATA: CBC: Recent Labs  Lab 04/15/22 1453 04/16/22 0046 04/16/22 0320 04/17/22 0721 04/18/22 0226  WBC 6.2 22.1* 20.4* 10.5 7.7  NEUTROABS 5.9  --   --  9.2* 6.2  HGB 13.2 9.6* 8.7* 9.9* 11.0*  HCT 41.5 29.6* 26.6* 31.5* 33.9*  MCV 79.5* 79.8* 81.1 80.8 79.0*  PLT 84* 72* 70* 63* 69*    Basic Metabolic Panel: Recent Labs  Lab 04/15/22 1453 04/15/22 1948 04/16/22 0046 04/16/22 0320 04/17/22 0721 04/18/22 0226  NA 133*  --  136 138 142 140  K 4.0  --  3.9 3.9 3.7 3.3*  CL 98  --  103 103 108 103  CO2 20*  --  19* 20* 23 25  GLUCOSE 207*  --  192* 223* 184* 149*  BUN 96*  --  81* 77* 54* 38*  CREATININE 4.08*  --  3.52* 3.30* 2.14* 1.69*  CALCIUM 8.8*  --  7.8* 8.0* 8.8* 9.1  MG 2.4  --   --  2.1  --  1.7  PHOS  --  2.8  --  4.8*  --   --     GFR: Estimated Creatinine Clearance: 31.5 mL/min (A) (by C-G formula based on SCr of 1.69 mg/dL (H)).  Liver Function Tests: Recent Labs   Lab 04/15/22 1453 04/16/22 0046 04/17/22 0721 04/18/22 0226  AST 130* 122* 117* 74*  ALT 57* 46* 60* 54*  ALKPHOS 173* 56 109 118  BILITOT 1.0 1.0 0.4 1.1  PROT 7.7 5.3* 5.7* 6.1*  ALBUMIN 3.5 2.2* 2.4* 2.6*    Recent Labs  Lab 04/15/22 1800  AMMONIA 18    Cardiac Enzymes: Recent Labs  Lab 04/15/22 1453  CKTOTAL 7,680*    Thyroid Function Tests: Recent Labs    04/15/22 2337 04/16/22 0320  TSH 0.168*  --   FREET4  --  1.28*    Anemia Panel: Recent Labs    04/15/22 2337  VITAMINB12 205    Urine analysis:    Component Value Date/Time   COLORURINE YELLOW 04/15/2022 1453   APPEARANCEUR CLOUDY (A) 04/15/2022 1453   LABSPEC 1.015 04/15/2022 1453   PHURINE 6.0 04/15/2022 1453   GLUCOSEU NEGATIVE 04/15/2022 1453   HGBUR LARGE (A) 04/15/2022 1453   BILIRUBINUR NEGATIVE 04/15/2022 1453   KETONESUR NEGATIVE 04/15/2022 1453   PROTEINUR 100 (A) 04/15/2022 1453   UROBILINOGEN 0.2 02/04/2015 1915   NITRITE NEGATIVE 04/15/2022 1453   LEUKOCYTESUR MODERATE (A) 04/15/2022 1453    Sepsis Labs: Lactic Acid, Venous    Component Value Date/Time   LATICACIDVEN 1.4 04/16/2022 0320    MICROBIOLOGY: Recent Results (from the past 240 hour(s))  Urine Culture     Status: Abnormal   Collection Time: 04/15/22  2:53 PM   Specimen: Urine, Catheterized  Result Value Ref Range Status   Specimen Description   Final    URINE, CATHETERIZED Performed at [facility], [address], [place], [place] [address]    Special Requests   Final    NONE Performed at [facility], [address] [place], [place] [address]    Culture >=100,000 COLONIES/mL ESCHERICHIA COLI (A)  Final   Report Status 04/17/2022 FINAL  Final   Organism ID, Bacteria ESCHERICHIA COLI (A)  Final      Susceptibility   Escherichia coli - MIC*    AMPICILLIN >=32 RESISTANT Resistant  CEFAZOLIN <=4 SENSITIVE Sensitive     CEFEPIME <=0.12 SENSITIVE Sensitive     CEFTRIAXONE <=0.25 SENSITIVE Sensitive      CIPROFLOXACIN >=4 RESISTANT Resistant     GENTAMICIN <=1 SENSITIVE Sensitive     IMIPENEM <=0.25 SENSITIVE Sensitive     NITROFURANTOIN <=16 SENSITIVE Sensitive     TRIMETH/SULFA 80 RESISTANT Resistant     AMPICILLIN/SULBACTAM >=32 RESISTANT Resistant     PIP/TAZO <=4 SENSITIVE Sensitive     * >=100,000 COLONIES/mL ESCHERICHIA COLI  Resp Panel by RT-PCR (Flu A&B, Covid) Anterior Nasal Swab     Status: None   Collection Time: 04/15/22  2:53 PM   Specimen: Anterior Nasal Swab  Result Value Ref Range Status   SARS Coronavirus 2 by RT PCR NEGATIVE NEGATIVE Final    Comment: (NOTE) SARS-CoV-2 target nucleic acids are NOT DETECTED.  The SARS-CoV-2 RNA is generally detectable in upper respiratory specimens during the acute phase of infection. The lowest concentration of SARS-CoV-2 viral copies this assay can detect is 138 copies/mL. A negative result does not preclude SARS-Cov-2 infection and should not be used as the sole basis for treatment or other patient management decisions. A negative result may occur with  improper specimen collection/handling, submission of specimen other than nasopharyngeal swab, presence of viral mutation(s) within the areas targeted by this assay, and inadequate number of viral copies(<138 copies/mL). A negative result must be combined with clinical observations, patient history, and epidemiological information. The expected result is Negative.  Fact Sheet for Patients:  EntrepreneurPulse.com.au  Fact Sheet for Healthcare Providers:  IncredibleEmployment.be  This test is no t yet approved or cleared by the [place] FDA and  has been authorized for detection and/or diagnosis of SARS-CoV-2 by FDA under an Emergency Use Authorization (EUA). This EUA will remain  in effect (meaning this test can be used) for the duration of the COVID-19 declaration under Section 564(b)(1) of the Act, 21 U.S.C.section 360bbb-3(b)(1),  unless the authorization is terminated  or revoked sooner.       Influenza A by PCR NEGATIVE NEGATIVE Final   Influenza B by PCR NEGATIVE NEGATIVE Final    Comment: (NOTE) The Xpert Xpress SARS-CoV-2/FLU/RSV plus assay is intended as an aid in the diagnosis of influenza from Nasopharyngeal swab specimens and should not be used as a sole basis for treatment. Nasal washings and aspirates are unacceptable for Xpert Xpress SARS-CoV-2/FLU/RSV testing.  Fact Sheet for Patients: EntrepreneurPulse.com.au  Fact Sheet for Healthcare Providers: IncredibleEmployment.be  This test is not yet approved or cleared by the [place] FDA and has been authorized for detection and/or diagnosis of SARS-CoV-2 by FDA under an Emergency Use Authorization (EUA). This EUA will remain in effect (meaning this test can be used) for the duration of the COVID-19 declaration under Section 564(b)(1) of the Act, 21 U.S.C. section 360bbb-3(b)(1), unless the authorization is terminated or revoked.  Performed at [facility], [address], [place], [place] [address]   Culture, blood (routine x 2)     Status: Abnormal   Collection Time: 04/15/22  3:01 PM   Specimen: BLOOD RIGHT FOREARM  Result Value Ref Range Status   Specimen Description   Final    BLOOD RIGHT FOREARM Performed at [facility], [address], [place], [place] [address]    Special Requests   Final    BOTTLES DRAWN AEROBIC AND ANAEROBIC Blood Culture adequate volume Performed at [facility], [address], [place], [place] [address]    Culture  Setup Time  Final    GRAM NEGATIVE RODS BOTTLES DRAWN AEROBIC AND ANAEROBIC Gram Stain Report Called to,Read Back By and Verified With: SCHUMUTZ,K'@0440'$  BY MATTHEWS B 11.17.2023   Culture ESCHERICHIA COLI (A)  Final   Report Status 04/18/2022 FINAL  Final   Organism ID, Bacteria ESCHERICHIA COLI  Final      Susceptibility   Escherichia coli - MIC*     AMPICILLIN >=32 RESISTANT Resistant     CEFAZOLIN <=4 SENSITIVE Sensitive     CEFEPIME <=0.12 SENSITIVE Sensitive     CEFTAZIDIME <=1 SENSITIVE Sensitive     CEFTRIAXONE <=0.25 SENSITIVE Sensitive     CIPROFLOXACIN >=4 RESISTANT Resistant     GENTAMICIN <=1 SENSITIVE Sensitive     IMIPENEM <=0.25 SENSITIVE Sensitive     TRIMETH/SULFA 80 RESISTANT Resistant     AMPICILLIN/SULBACTAM >=32 RESISTANT Resistant     PIP/TAZO <=4 SENSITIVE Sensitive     * ESCHERICHIA COLI  Blood Culture ID Panel (Reflexed)     Status: Abnormal   Collection Time: 04/15/22  3:01 PM  Result Value Ref Range Status   Enterococcus faecalis NOT DETECTED NOT DETECTED Final   Enterococcus Faecium NOT DETECTED NOT DETECTED Final   Listeria monocytogenes NOT DETECTED NOT DETECTED Final   Staphylococcus species NOT DETECTED NOT DETECTED Final   Staphylococcus aureus (BCID) NOT DETECTED NOT DETECTED Final   Staphylococcus epidermidis NOT DETECTED NOT DETECTED Final   Staphylococcus lugdunensis NOT DETECTED NOT DETECTED Final   Streptococcus species NOT DETECTED NOT DETECTED Final   Streptococcus agalactiae NOT DETECTED NOT DETECTED Final   Streptococcus pneumoniae NOT DETECTED NOT DETECTED Final   Streptococcus pyogenes NOT DETECTED NOT DETECTED Final   A.calcoaceticus-baumannii NOT DETECTED NOT DETECTED Final   Bacteroides fragilis NOT DETECTED NOT DETECTED Final   Enterobacterales DETECTED (A) NOT DETECTED Final    Comment: Enterobacterales represent a large order of gram negative bacteria, not a single organism. CRITICAL RESULT CALLED TO, READ BACK BY AND VERIFIED WITH: J. FRENS PHARMD, AT 4270 04/16/22 BY D. VANHOOK    Enterobacter cloacae complex NOT DETECTED NOT DETECTED Final   Escherichia coli DETECTED (A) NOT DETECTED Final    Comment: CRITICAL RESULT CALLED TO, READ BACK BY AND VERIFIED WITH: J. FRENS PHARMD, AT 6237 04/16/22 BY D. VANHOOK    Klebsiella aerogenes NOT DETECTED NOT DETECTED Final    Klebsiella oxytoca NOT DETECTED NOT DETECTED Final   Klebsiella pneumoniae NOT DETECTED NOT DETECTED Final   Proteus species NOT DETECTED NOT DETECTED Final   Salmonella species NOT DETECTED NOT DETECTED Final   Serratia marcescens NOT DETECTED NOT DETECTED Final   Haemophilus influenzae NOT DETECTED NOT DETECTED Final   Neisseria meningitidis NOT DETECTED NOT DETECTED Final   Pseudomonas aeruginosa NOT DETECTED NOT DETECTED Final   Stenotrophomonas maltophilia NOT DETECTED NOT DETECTED Final   Candida albicans NOT DETECTED NOT DETECTED Final   Candida auris NOT DETECTED NOT DETECTED Final   Candida glabrata NOT DETECTED NOT DETECTED Final   Candida krusei NOT DETECTED NOT DETECTED Final   Candida parapsilosis NOT DETECTED NOT DETECTED Final   Candida tropicalis NOT DETECTED NOT DETECTED Final   Cryptococcus neoformans/gattii NOT DETECTED NOT DETECTED Final   CTX-M ESBL NOT DETECTED NOT DETECTED Final   Carbapenem resistance IMP NOT DETECTED NOT DETECTED Final   Carbapenem resistance KPC NOT DETECTED NOT DETECTED Final   Carbapenem resistance NDM NOT DETECTED NOT DETECTED Final   Carbapenem resist OXA 48 LIKE NOT DETECTED NOT DETECTED Final   Carbapenem resistance VIM  NOT DETECTED NOT DETECTED Final    Comment: Performed at [facility], [place] [address], [place], [place] [address]  Culture, blood (routine x 2)     Status: Abnormal   Collection Time: 04/15/22  3:37 PM   Specimen: BLOOD  Result Value Ref Range Status   Specimen Description   Final    BLOOD BLOOD RIGHT ARM Performed at [facility], [address] [place], [place] [address]    Special Requests   Final    BOTTLES DRAWN AEROBIC AND ANAEROBIC Blood Culture adequate volume Performed at [facility], [address] [place], [place] [address]    Culture  Setup Time   Final    GRAM NEGATIVE RODS BOTTLES DRAWN AEROBIC AND ANAEROBIC Gram Stain Report Called to,Read Back By and Verified With: SCHMUTZ,K'@MOSES'$  CONE '@0440'$  BY  MATTHEWS, B 11.17.2023 CRITICAL RESULT CALLED TO, READ BACK BY AND VERIFIED WITH: J. FRENS PHARMD, AT 1131 04/16/22 BY D. VANHOOK    Culture (A)  Final    ESCHERICHIA COLI SUSCEPTIBILITIES PERFORMED ON PREVIOUS CULTURE WITHIN THE LAST 5 DAYS. Performed at [facility], [place] [address], [place], [place] [address]    Report Status 04/18/2022 FINAL  Final  CSF culture     Status: None (Preliminary result)   Collection Time: 04/15/22  3:57 PM   Specimen: Back; Cerebrospinal Fluid  Result Value Ref Range Status   Specimen Description   Final    BACK Performed at [facility], [address], [place], [place] [address]    Special Requests   Final    NONE Performed at [facility], [address] [place], [place] [address]    Gram Stain   Final    WBC PRESENT, PREDOMINANTLY MONONUCLEAR NO ORGANISMS SEEN    Culture   Final    NO GROWTH 3 DAYS Performed at [facility], [place] [address], [place], [place] [address]    Report Status PENDING  Incomplete  Gram stain     Status: None   Collection Time: 04/15/22  3:57 PM   Specimen: CSF; Cerebrospinal Fluid  Result Value Ref Range Status   Specimen Description CSF  Final   Special Requests NONE  Final   Gram Stain   Final    NO ORGANISMS SEEN WBC PRESENT, PREDOMINANTLY MONONUCLEAR Performed at [facility], [address], [place], [place] [address]    Report Status 04/15/2022 FINAL  Final  MRSA Next Gen by PCR, Nasal     Status: None   Collection Time: 04/15/22 10:18 PM   Specimen: Nasal Mucosa; Nasal Swab  Result Value Ref Range Status   MRSA by PCR Next Gen NOT DETECTED NOT DETECTED Final    Comment: (NOTE) The GeneXpert MRSA Assay (FDA approved for NASAL specimens only), is one component of a comprehensive MRSA colonization surveillance program. It is not intended to diagnose MRSA infection nor to guide or monitor treatment for MRSA infections. Test performance is not FDA approved in patients less than 57  years old. Performed at [facility], [place] [address] [place],  [address]   Respiratory (~20 pathogens) panel by PCR     Status: None   Collection Time: 04/16/22  2:18 AM   Specimen: Nasopharyngeal Swab; Respiratory  Result Value Ref Range Status   Adenovirus NOT DETECTED NOT DETECTED Final   Coronavirus 229E NOT DETECTED NOT DETECTED Final    Comment: (NOTE) The Coronavirus on the Respiratory Panel, DOES NOT test for the  novel  Coronavirus (2019 nCoV)    Coronavirus HKU1 NOT DETECTED NOT DETECTED Final   Coronavirus NL63 NOT DETECTED NOT DETECTED Final   Coronavirus OC43 NOT DETECTED NOT DETECTED Final   Metapneumovirus NOT DETECTED NOT DETECTED Final   Rhinovirus / Enterovirus NOT DETECTED NOT DETECTED Final   Influenza A NOT DETECTED NOT DETECTED Final   Influenza B NOT DETECTED NOT DETECTED Final   Parainfluenza Virus 1 NOT DETECTED NOT DETECTED Final   Parainfluenza Virus 2 NOT DETECTED NOT DETECTED Final   Parainfluenza Virus 3 NOT DETECTED NOT DETECTED Final   Parainfluenza Virus 4 NOT DETECTED NOT DETECTED Final   Respiratory Syncytial Virus NOT DETECTED NOT DETECTED Final   Bordetella pertussis NOT DETECTED NOT DETECTED Final   Bordetella Parapertussis NOT DETECTED NOT DETECTED Final   Chlamydophila pneumoniae NOT DETECTED NOT DETECTED Final   Mycoplasma pneumoniae NOT DETECTED NOT DETECTED Final    Comment: Performed at [facility], [place] [address] [place], [place] [address]    RADIOLOGY STUDIES/RESULTS: ECHOCARDIOGRAM COMPLETE  Result Date: 04/17/2022    ECHOCARDIOGRAM REPORT   Patient Name:   Anita [name] Date of Exam: 04/17/2022 Medical Rec #:  [id number]    Height:       65.0 in Accession #:    [id number]   Weight:       128.3 lb Date of Birth:  [birth year]     BSA:          1.638 m Patient Age:    46 years     BP:           178/76 mmHg Patient Gender: F            HR:           72 bpm. Exam Location:  Inpatient Procedure: 2D Echo Indications:     atrial fibrillation  History:        Patient has prior history of Echocardiogram examinations, most                 recent 08/10/2018. CAD; Risk Factors:Hypertension, Diabetes and                 Dyslipidemia.  Sonographer:    Harvie Junior Referring Phys: [place]  Sonographer Comments: Technically difficult study due to poor echo windows. AMS. IMPRESSIONS  1. Left ventricular ejection fraction, by estimation, is 60 to 65%. The left ventricle has normal function. The left ventricle has no regional wall motion abnormalities. Left ventricular diastolic parameters were normal.  2. Right ventricular systolic function is normal. The right ventricular size is normal. There is normal pulmonary artery systolic pressure.  3. The mitral valve is normal in structure. Trivial mitral valve regurgitation. No evidence of mitral stenosis.  4. The aortic valve has an indeterminant number of cusps. Aortic valve regurgitation is not visualized. No aortic stenosis is present.  5. The inferior vena cava is normal in size with greater than 50% respiratory variability, suggesting right atrial pressure of 3 mmHg. FINDINGS  Left Ventricle: Left ventricular ejection fraction, by estimation, is 60 to 65%. The left ventricle has normal function. The left ventricle has no regional wall motion abnormalities. The left ventricular internal cavity size was normal in size. There is  no left ventricular hypertrophy. Left ventricular diastolic parameters were normal. Right Ventricle: The right ventricular size is normal. Right ventricular systolic function is normal. There is normal pulmonary artery systolic pressure. The tricuspid regurgitant velocity is 2.66  m/s, and with an assumed right atrial pressure of 3 mmHg,  the estimated right ventricular systolic pressure is 74.1 mmHg. Left Atrium: Left atrial size was normal in size. Right Atrium: Right atrial size was normal in size. Pericardium: There is no evidence of pericardial effusion.  Mitral Valve: The mitral valve is normal in structure. Trivial mitral valve regurgitation. No evidence of mitral valve stenosis. Tricuspid Valve: The tricuspid valve is normal in structure. Tricuspid valve regurgitation is trivial. No evidence of tricuspid stenosis. Aortic Valve: The aortic valve has an indeterminant number of cusps. Aortic valve regurgitation is not visualized. No aortic stenosis is present. Aortic valve mean gradient measures 3.0 mmHg. Aortic valve peak gradient measures 5.6 mmHg. Aortic valve area, by VTI measures 1.85 cm. Pulmonic Valve: The pulmonic valve was not well visualized. Pulmonic valve regurgitation is not visualized. No evidence of pulmonic stenosis. Aorta: The aortic root is normal in size and structure. Venous: The inferior vena cava is normal in size with greater than 50% respiratory variability, suggesting right atrial pressure of 3 mmHg. IAS/Shunts: No atrial level shunt detected by color flow Doppler.  LEFT VENTRICLE PLAX 2D LVIDd:         4.00 cm      Diastology LVIDs:         2.50 cm      LV e' medial:    7.83 cm/s LV PW:         1.00 cm      LV E/e' medial:  10.8 LV IVS:        0.80 cm      LV e' lateral:   9.57 cm/s LVOT diam:     1.60 cm      LV E/e' lateral: 8.9 LV SV:         47 LV SV Index:   29 LVOT Area:     2.01 cm  LV Volumes (MOD) LV vol d, MOD A2C: 54.1 ml LV vol d, MOD A4C: 112.0 ml LV vol s, MOD A2C: 26.5 ml LV vol s, MOD A4C: 39.1 ml LV SV MOD A2C:     27.6 ml LV SV MOD A4C:     112.0 ml LV SV MOD BP:      53.4 ml RIGHT VENTRICLE RV S prime:     12.90 cm/s TAPSE (M-mode): 1.3 cm LEFT ATRIUM           Index LA diam:      3.20 cm 1.95 cm/m LA Vol (A2C): 25.0 ml 15.26 ml/m LA Vol (A4C): 49.6 ml 30.28 ml/m  AORTIC VALVE                    PULMONIC VALVE AV Area (Vmax):    1.87 cm     PV Vmax:       0.98 m/s AV Area (Vmean):   1.80 cm     PV Peak grad:  3.8 mmHg AV Area (VTI):     1.85 cm AV Vmax:           118.00 cm/s AV Vmean:          85.500 cm/s AV VTI:             0.257 m AV Peak Grad:      5.6 mmHg AV Mean Grad:      3.0 mmHg LVOT Vmax:         110.00 cm/s LVOT Vmean:        76.400 cm/s LVOT  VTI:          0.236 m LVOT/AV VTI ratio: 0.92  AORTA Ao Root diam: 2.70 cm MITRAL VALVE               TRICUSPID VALVE MV Area (PHT): 3.60 cm    TR Peak grad:   28.3 mmHg MV Decel Time: 211 msec    TR Vmax:        266.00 cm/s MR Peak grad: 39.2 mmHg MR Vmax:      313.00 cm/s  SHUNTS MV E velocity: 84.80 cm/s  Systemic VTI:  0.24 m MV A velocity: 73.70 cm/s  Systemic Diam: 1.60 cm MV E/A ratio:  1.15 Kirk Ruths MD Electronically signed by Kirk Ruths MD Signature Date/Time: 04/17/2022/3:31:04 PM    Final      LOS: 3 days   Vernell Leep, MD,  FACP, [place], [facility], [facility], [place]     To contact the attending provider between 7A-7P or the covering provider during after hours 7P-7A, please log into the web site [email or web address] and access using universal [place] password for that web site. If you do not have the password, please call the hospital operator.

## 2022-04-18 NOTE — Progress Notes (Signed)
Mobility Specialist Progress Note:   04/18/22 1612  Mobility  Activity Transferred from chair to bed  Level of Assistance Standby assist, set-up cues, supervision of patient - no hands on  Assistive Device Front wheel walker  Activity Response Tolerated well  Mobility Referral Yes  $Mobility charge 1 Mobility   Pt received in chair, requesting transfer to bed. No physical assistance required. Pt left in bed with all needs met, call bell in reach, and RN in room.   Andrey Campanile Mobility Specialist Please contact via SecureChat or  Rehab office at [phone]

## 2022-04-18 NOTE — Plan of Care (Signed)
  Problem: Clinical Measurements: Goal: Respiratory complications will improve Outcome: Progressing Goal: Cardiovascular complication will be avoided Outcome: Progressing   Problem: Activity: Goal: Risk for activity intolerance will decrease Outcome: Progressing   Problem: Nutrition: Goal: Adequate nutrition will be maintained Outcome: Not Progressing

## 2022-04-19 ENCOUNTER — Other Ambulatory Visit: Payer: Self-pay | Admitting: Physician Assistant

## 2022-04-19 DIAGNOSIS — B9689 Other specified bacterial agents as the cause of diseases classified elsewhere: Secondary | ICD-10-CM | POA: Diagnosis not present

## 2022-04-19 DIAGNOSIS — G9341 Metabolic encephalopathy: Secondary | ICD-10-CM | POA: Diagnosis not present

## 2022-04-19 DIAGNOSIS — R002 Palpitations: Secondary | ICD-10-CM

## 2022-04-19 DIAGNOSIS — R7881 Bacteremia: Secondary | ICD-10-CM | POA: Diagnosis not present

## 2022-04-19 DIAGNOSIS — N10 Acute tubulo-interstitial nephritis: Secondary | ICD-10-CM | POA: Diagnosis not present

## 2022-04-19 LAB — COMPREHENSIVE METABOLIC PANEL
ALT: 47 U/L — ABNORMAL HIGH (ref 0–44)
AST: 48 U/L — ABNORMAL HIGH (ref 15–41)
Albumin: 2.5 g/dL — ABNORMAL LOW (ref 3.5–5.0)
Alkaline Phosphatase: 139 U/L — ABNORMAL HIGH (ref 38–126)
Anion gap: 14 (ref 5–15)
BUN: 38 mg/dL — ABNORMAL HIGH (ref 8–23)
CO2: 25 mmol/L (ref 22–32)
Calcium: 8.8 mg/dL — ABNORMAL LOW (ref 8.9–10.3)
Chloride: 102 mmol/L (ref 98–111)
Creatinine, Ser: 1.77 mg/dL — ABNORMAL HIGH (ref 0.44–1.00)
GFR, Estimated: 32 mL/min — ABNORMAL LOW (ref >60)
Glucose, Bld: 194 mg/dL — ABNORMAL HIGH (ref 70–99)
Potassium: 4.3 mmol/L (ref 3.5–5.1)
Sodium: 141 mmol/L (ref 135–145)
Total Bilirubin: 0.6 mg/dL (ref 0.3–1.2)
Total Protein: 5.8 g/dL — ABNORMAL LOW (ref 6.5–8.1)

## 2022-04-19 LAB — CBC
HCT: 34.7 % — ABNORMAL LOW (ref 36.0–46.0)
Hemoglobin: 10.9 g/dL — ABNORMAL LOW (ref 12.0–15.0)
MCH: 25.6 pg — ABNORMAL LOW (ref 26.0–34.0)
MCHC: 31.4 g/dL (ref 30.0–36.0)
MCV: 81.5 fL (ref 80.0–100.0)
Platelets: 99 10*3/uL — ABNORMAL LOW (ref 150–400)
RBC: 4.26 MIL/uL (ref 3.87–5.11)
RDW: 16.1 % — ABNORMAL HIGH (ref 11.5–15.5)
WBC: 7.3 10*3/uL (ref 4.0–10.5)
nRBC: 0 % (ref 0.0–0.2)

## 2022-04-19 LAB — CSF CULTURE W GRAM STAIN: Culture: NO GROWTH

## 2022-04-19 LAB — ANCA TITERS
Atypical P-ANCA titer: <1:20 {titer}
C-ANCA: <1:20 {titer}
P-ANCA: <1:20 {titer}

## 2022-04-19 LAB — GLUCOSE, CAPILLARY
Glucose-Capillary: 176 mg/dL — ABNORMAL HIGH (ref 70–99)
Glucose-Capillary: 326 mg/dL — ABNORMAL HIGH (ref 70–99)
Glucose-Capillary: 216 mg/dL — ABNORMAL HIGH (ref 70–99)
Glucose-Capillary: 274 mg/dL — ABNORMAL HIGH (ref 70–99)

## 2022-04-19 LAB — PHOSPHORUS: Phosphorus: 2.4 mg/dL — ABNORMAL LOW (ref 2.5–4.6)

## 2022-04-19 LAB — LEGIONELLA PNEUMOPHILA SEROGP 1 UR AG: L. pneumophila Serogp 1 Ur Ag: NEGATIVE

## 2022-04-19 LAB — MAGNESIUM: Magnesium: 1.6 mg/dL — ABNORMAL LOW (ref 1.7–2.4)

## 2022-04-19 LAB — CK: Total CK: 260 U/L — ABNORMAL HIGH (ref 38–234)

## 2022-04-19 MED ORDER — HYDRALAZINE HCL 50 MG PO TABS
50.0000 mg | ORAL_TABLET | Freq: Two times a day (BID) | ORAL | Status: DC
  Administered 2022-04-19 – 2022-04-20 (×3): 50 mg via ORAL
  Filled 2022-04-19 (×3): qty 1

## 2022-04-19 MED ORDER — TIZANIDINE HCL 2 MG PO TABS
4.0000 mg | ORAL_TABLET | Freq: Every day | ORAL | Status: DC
  Administered 2022-04-19: 4 mg via ORAL
  Filled 2022-04-19: qty 2

## 2022-04-19 MED ORDER — OXYCODONE HCL 5 MG PO TABS
10.0000 mg | ORAL_TABLET | Freq: Four times a day (QID) | ORAL | Status: DC | PRN
  Administered 2022-04-19 – 2022-04-20 (×2): 10 mg via ORAL
  Filled 2022-04-19 (×2): qty 2

## 2022-04-19 MED ORDER — CEFADROXIL 500 MG PO CAPS
500.0000 mg | ORAL_CAPSULE | Freq: Two times a day (BID) | ORAL | Status: DC
  Administered 2022-04-19 – 2022-04-20 (×2): 500 mg via ORAL
  Filled 2022-04-19 (×2): qty 1

## 2022-04-19 MED ORDER — LACTATED RINGERS IV SOLN: INTRAVENOUS | Status: AC

## 2022-04-19 MED ORDER — ALPRAZOLAM 0.5 MG PO TABS
0.5000 mg | ORAL_TABLET | Freq: Three times a day (TID) | ORAL | Status: DC
  Administered 2022-04-19 – 2022-04-20 (×3): 0.5 mg via ORAL
  Filled 2022-04-19 (×3): qty 1

## 2022-04-19 MED ORDER — VITAMIN D 25 MCG (1000 UNIT) PO TABS
5000.0000 [IU] | ORAL_TABLET | Freq: Every day | ORAL | Status: DC
  Administered 2022-04-19 – 2022-04-20 (×2): 5000 [IU] via ORAL
  Filled 2022-04-19 (×2): qty 5

## 2022-04-19 MED ORDER — EPINEPHRINE 0.3 MG/0.3ML IJ SOAJ
0.3000 mg | INTRAMUSCULAR | Status: DC | PRN
  Filled 2022-04-19: qty 0.6

## 2022-04-19 MED ORDER — ADULT MULTIVITAMIN W/MINERALS CH
1.0000 | ORAL_TABLET | Freq: Every day | ORAL | Status: DC
  Administered 2022-04-19 – 2022-04-20 (×2): 1 via ORAL
  Filled 2022-04-19 (×2): qty 1

## 2022-04-19 MED ORDER — K PHOS MONO-SOD PHOS DI & MONO 155-852-130 MG PO TABS
250.0000 mg | ORAL_TABLET | Freq: Two times a day (BID) | ORAL | Status: AC
  Administered 2022-04-19 (×2): 250 mg via ORAL
  Filled 2022-04-19 (×2): qty 1

## 2022-04-19 MED ORDER — MAGNESIUM SULFATE 2 GM/50ML IV SOLN
2.0000 g | Freq: Once | INTRAVENOUS | Status: AC
  Administered 2022-04-19: 2 g via INTRAVENOUS
  Filled 2022-04-19: qty 50

## 2022-04-19 MED ORDER — ACETAMINOPHEN 325 MG PO TABS: 650.0000 mg | ORAL_TABLET | Freq: Four times a day (QID) | ORAL | Status: DC | PRN

## 2022-04-19 NOTE — TOC Progression Note (Addendum)
Transition of Care [facility]) - Progression Note    Patient Details  Name: Anita [name] MRN: [id number] Date of Birth: Aug 15, 1960  [facility] CM/SW Contact  Bartholomew Crews, RN Phone Number: [phone] 04/19/2022, 10:05 AM  Clinical Narrative:     Notified by Randall Hiss at [facility] this AM that patient's insurance is OON. Additional referrals to [facility] and [facility] declined d/t staffing issues.   UPDATE: [facility] accepted [place] referral for PT, OT, SW. Patient will need HH PT, OT, SW Face to Face orders. Daughter notified of accepting agency.   Spoke with patient's daughter, Anita [name], on cell phone to advise of declining [place] agencies. Will continue to search for accepting agencies.    Expected Discharge Plan: [place] Barriers to Discharge: Continued Medical Work up  Expected Discharge Plan and Services Expected Discharge Plan: [place]   Discharge Planning Services: CM Consult   Living arrangements for the past 2 months: Single Family Home                 DME Arranged: N/A DME Agency: NA       HH Arranged: PT, OT HH Agency: NA Date HH Agency Contacted: 04/18/22 Time [place]: 1646 Representative spoke with at [place]: [place] Determinants of Health ([place]) Interventions    Readmission Risk Interventions     No data to display

## 2022-04-19 NOTE — Consult Note (Addendum)
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 including CT November 10, 2021 FINDINGS: CT CHEST FINDINGS Cardiovascular: Aortic atherosclerosis. Enlarged main pulmonary artery measures 3.2 cm. Cardiac enlargement. Coronary artery calcifications. Mediastinum/Nodes: Partially calcified 9 mm right thyroid nodule. Not clinically significant; no follow-up imaging recommended (ref: J Am Coll Radiol. 2015 Feb;12(2): 143-50).No pathologically enlarged mediastinal, hilar or axillary lymph nodes, noting limited sensitivity for the detection of hilar adenopathy on this noncontrast study. Patulous esophagus with retained/refluxed contents in the esophagus. Lungs/Pleura: Patchy bilateral ground-glass opacities with multifocal linear/banded consolidative opacities  likely reflect a combination of atelectasis and an infectious or inflammatory process. No pleural effusion. No pneumothorax. Musculoskeletal: Prior median sternotomy. No acute osseous abnormality. CT ABDOMEN  PELVIS FINDINGS Hepatobiliary: Unremarkable noncontrast enhanced appearance of the hepatic parenchyma. Gallbladder is distended. No biliary ductal dilation. Pancreas: No pancreatic ductal dilation or evidence of acute inflammation. Spleen: No splenomegaly. Adrenals/Urinary Tract: Similar thickening of the bilateral adrenal glands without discrete nodularity, favored benign hyperplasia and requiring no follow-up. Perinephric and Peri pelvic stranding. Nonobstructive 9 mm left upper pole renal stone. Hydronephrosis. Gas in the urinary bladder. Stomach/Bowel: Stomach is minimally distended limiting evaluation. No pathologic dilation of small or large bowel. Normal appendix. Moderate volume of formed stool throughout the colon. Mild rectal and sigmoid colon wall thickening for instance on 105/2. Vascular/Lymphatic: Aortic atherosclerosis. No pathologically enlarged abdominal or pelvic lymph nodes. Reproductive: Status post hysterectomy. No adnexal masses. Other: Mild nonspecific body wall edema. No significant abdominopelvic free fluid. No walled off fluid collections. Musculoskeletal: No acute osseous abnormality. IMPRESSION: 1. Patchy bilateral ground-glass opacities with multifocal linear/banded consolidative opacities likely reflect a combination of atelectasis and an infectious or inflammatory process. 2. Mild rectal and sigmoid colon wall thickening, which may reflect a nonspecific proctocolitis. 3. Mild perinephric and peripelvic stranding with a nonobstructive 9 mm left upper pole renal stone. Suggest correlation with laboratory values for infection. 4. Gas in the urinary bladder, correlate for recent instrumentation. 5. Moderate volume of formed stool throughout the colon. Correlate for constipation. 6.  Enlarged main pulmonary artery, which can be seen in the setting of pulmonary arterial hypertension. 7.  Aortic Atherosclerosis (ICD10-I70.0). Electronically Signed   By: Dahlia Bailiff M.D.   On: 04/15/2022 18:48   CT Head Wo Contrast  Result Date: 04/15/2022 CLINICAL DATA:  Seizure. EXAM: CT HEAD WITHOUT CONTRAST TECHNIQUE: Contiguous axial images were obtained from the base of the skull through the vertex without intravenous contrast. RADIATION DOSE REDUCTION: This exam was performed according to the departmental dose-optimization program which includes automated exposure control, adjustment of the mA and/or kV according to patient size and/or use of iterative reconstruction technique. COMPARISON:  November 10, 2021. FINDINGS: Brain: No evidence of acute infarction, hemorrhage, hydrocephalus, extra-axial collection or mass lesion/mass effect. Vascular: No hyperdense vessel or unexpected calcification. Skull: Normal. Negative for fracture or focal lesion. Sinuses/Orbits: No acute finding. Other: None. IMPRESSION: No acute intracranial abnormality seen. Electronically Signed   By: Marijo Conception M.D.   On: 04/15/2022 15:21   DG Chest Port 1 View  Result Date: 04/15/2022 CLINICAL DATA:  Seizure EXAM: PORTABLE CHEST 1 VIEW COMPARISON:  11/10/2021 FINDINGS: Single frontal view of the chest demonstrates a stable cardiac silhouette. Postsurgical changes from median sternotomy. No airspace disease, effusion, or pneumothorax. No acute bony abnormalities. IMPRESSION: 1. No acute intrathoracic process. Electronically Signed   By: Randa Ngo M.D.   On: 04/15/2022 15:05     Assessment and Plan:   PAF -On a monitor in 2020, she had 1 episode of an 8 beat atrial run.  This was not described as atrial fibrillation or flutter. -She has palpitations once or twice a month, they do not really cause any symptoms. -MD advise on patient wearing a monitor and if it should be applied in the hospital tomorrow. -I will  arrange a follow-up with Dr. Evette Georges team in about 6 weeks  2.  S/p ASD repair -I cannot appreciate a murmur and her echo report is normal - Follow Up as scheduled   Risk Assessment/Risk Scores:        CHA2DS2-VASc Score = 5   This indicates a 7.2% annual risk of stroke. The patient's score is based upon:  CHF History: 0 HTN History: 1 Diabetes History: 0 Stroke History: 2 Vascular Disease History: 1 Age Score: 0 Gender Score: 1  For questions or updates, please contact [place] Please consult [email or web address] for contact info under    Signed, Rosaria Ferries, PA-C  04/19/2022 1:24 PM  Personally seen and examined. Agree with above.  61 year old with question regarding anticoagulation given prior episode of rapid atrial fibrillation on 11/18 that spontaneously converted into sinus rhythm after a short period of time.  There was also possibility that this episode was atrial tachycardia.  Nonetheless she spontaneously converted back to sinus rhythm.  Ejection fraction was normal on echocardiogram.  At that time her platelets were significantly low.  She does have sensation of fluttering at times.  This seems to be associated with occasional PACs.  We have not detected any further evidence of atrial fibrillation.  Previously in 2020 she had a monitor that showed atrial tachycardia.  No evidence of atrial fibrillation or flutter.  Her recent bout of atrial fibrillation reported is related to her underlying medical condition.  As she continues to improve, this will hopefully improve as well as we are seeing.  In all, I would avoid use of anticoagulation unless we have well-documented evidence of prolonged atrial fibrillation.  We will go ahead and set her up for a Zio patch monitor as an outpatient which will be 14 days in length to help discover any evidence of atrial fibrillation.  We will go ahead and sign off.  Please reach out with any other questions.  Candee Furbish, MD

## 2022-04-19 NOTE — Discharge Instructions (Signed)
Food Programs in [facility] [address] [place], [place] - [address] Phone: [phone] Services: Provides a food pantry on the 3rd Thursday of each month from 6:00 pm to 7:00 pm  [facility] [address] [place], [place] [address] Phone: [phone] Email: [email or web address] Monday, Wednesday, Thursday, Friday: 9:00 am - 12:00 pm  [facility] [phone] to determine eligibility for home delivered meals.  [facility] [address] [place], [place] [address] Contact Info Phone: [phone] 2nd and 4th Tuesday from: 10:00 am - 1:00 pm  [facility] [address] [place], [place] [address] Phone: [phone] Wednesday 9:00 am - 11:00 am  [facility] [address] [place], [place] [address] Phone: [phone] Wednesday, 9:00 am -11:00 am  [facility] [place], [place] [address] Phone: [phone] [email or web address] Thursday 9:00 am - 12 pm (biweekly). Call for details on what days.  [facility] [address] [place], [place] [phone] [address] Phone: [phone] 4th Wednesday of each month 9:00 am - 12:00 pm  [place], [place] Phone: [phone], [facility] [place] Mondays: 9:00 AM - 12:00 PM and 1:00 PM - 4:00 PM Tuesdays: 9:00 AM - 12:00 PM and 1:00 PM - 4:00 PM Wednesdays: 9:00 AM - 12:00 PM and 1:00 PM - 4:00 PM Thursdays: 9:00 AM - 12:00 PM and 1:00 PM - 4:00 PM  [place] [facility] [address] [place], [place] [address] Phone: [phone] 4th Wednesday of each month from 4:00-6:00pm    Additional Discharge Instructions   Please get your medications reviewed and adjusted by your Primary MD.  Please request your Primary MD to go over  all Hospital Tests and Procedure/Radiological results at the follow up, please get all Hospital records sent to your Prim MD by signing hospital release before you go home.  If you had Pneumonia of Lung problems at the Hospital: Please get a 2 view Chest X ray done in approximately 4 weeks after hospital discharge or sooner if instructed by your Primary MD.  If you have Congestive Heart Failure: Please call your Cardiologist or Primary MD anytime you have any of the following symptoms:  1) 3 pound weight gain in 24 hours or 5 pounds in 1 week  2) shortness of breath, with or without a dry hacking cough  3) swelling in the hands, feet or stomach  4) if you have to sleep on extra pillows at night in order to breathe  Follow cardiac low salt diet and 1.5 lit/day fluid restriction.  If you have diabetes Accuchecks 4 times/day, Once in AM empty stomach and then before each meal. Log in all results and show them to your primary doctor at your next visit. If any glucose reading is under 80 or above 300 call your primary MD immediately.  If you have Seizure/Convulsions/Epilepsy: Please do not drive, operate heavy machinery, participate in activities at heights or participate in high speed sports until you have seen by Primary MD or a Neurologist and advised to do so again. Per [facility] statutes, patients with seizures are not allowed to drive until they have been seizure-free for six months.  Use caution when using heavy equipment or power tools. Avoid  working on [employer] libr[employer] or at [employer]. Take showers instead of baths. Ensure the water temperature is not too high on the home water heater. Do not go swimming alone. Do not lock yourself in a room alone (i.e. bathroom). When caring for infants or small children, sit down when holding, feeding, or changing them to minimize risk of injury to the child in the event you have a seizure. Maintain good sleep hygiene. Avoid alcohol.   If you had  Gastrointestinal Bleeding: Please ask your Primary MD to check a complete blood count within one week of discharge or at your next visit. Your endoscopic/colonoscopic biopsies that are pending at the time of discharge, will also need to followed by your Primary MD.  Get Medicines reviewed and adjusted. Please take all your medications with you for your next visit with your Primary MD  Please request your Primary MD to go over all hospital tests and procedure/radiological results at the follow up, please ask your Primary MD to get all Hospital records sent to his/her office.  If you experience worsening of your admission symptoms, develop shortness of breath, life threatening emergency, suicidal or homicidal thoughts you must seek medical attention immediately by calling 911 or calling your MD immediately  if symptoms less severe.  You must read complete instructions/literature along with all the possible adverse reactions/side effects for all the Medicines you take and that have been prescribed to you. Take any new Medicines after you have completely understood and accpet all the possible adverse reactions/side effects.   Do not drive or operate heavy machinery when taking Pain medications.   Do not take more than prescribed Pain, Sleep and Anxiety Medications  Special Instructions: If you have smoked or chewed Tobacco  in the last 2 yrs please stop smoking, stop any regular Alcohol  and or any Recreational drug use.  Wear Seat belts while driving.  Please note You were cared for by a hospitalist during your hospital stay. If you have any questions about your discharge medications or the care you received while you were in the hospital after you are discharged, you can call the unit and asked to speak with the hospitalist on call if the hospitalist that took care of you is not available. Once you are discharged, your primary care physician will handle any further medical issues. Please note that  NO REFILLS for any discharge medications will be authorized once you are discharged, as it is imperative that you return to your primary care physician (or establish a relationship with a primary care physician if you do not have one) for your aftercare needs so that they can reassess your need for medications and monitor your lab values.  You can reach the hospitalist office at phone [phone] or fax [phone]   If you do not have a primary care physician, you can call [phone] for a physician referral.

## 2022-04-19 NOTE — Progress Notes (Signed)
Initial Nutrition Assessment  DOCUMENTATION CODES:  Non-severe (moderate) malnutrition in context of social or environmental circumstances  INTERVENTION:  -Provide Carb Modified Diet  -Start daily MVI -Provide list of community resources for food benefits in her area  NUTRITION DIAGNOSIS:  Moderate Malnutrition related to social / environmental circumstances as evidenced by moderate fat depletion, moderate muscle depletion.  GOAL:  Patient will meet greater than or equal to 90% of their needs  MONITOR:  PO intake  REASON FOR ASSESSMENT:  Malnutrition Screening Tool    ASSESSMENT:  Pt is a 61yo F with PMH of CAD, lacunar stroke, HTN, T2DM, CKD 3a, hypothyroidism, and chronic pain who presents with acute metabolic encephalopathy/seizure-like activity and upon further evaluation she was found to have pyelonephritis with gram-negative bacteremia.  Visited pt at bedside this morning. She reports limited acceptance of meals during admission therefore inadequate po intake. PTA pt reports difficulty purchasing foods for her and her husband. She has tried to apply for SNAP benefits but did not qualify. Pt has been losing weight recently but she is unsure how much weight. NFPE shows mild to moderate fat loss and mild to moderate muscle wasting. Pt meets ASPEN criteria for moderate protein calorie malnutrition r/t social environmental circumstances.  Pt reports she tries to eat healthy at home for management of her DM but it is too expensive. Offered to look into food pantries and programs in her area for assistance. Pt agreeable and list added to discharge. Pt reports BG is never as high as it has been during admission. Recommend changing to diet carb modified only to allow more menu options with portions control. Also offered ONS, but pt declined at this time. Pt also c/o taste changes, recommend starting daily MVI during admission.  Medications reviewed and include: lipitor, vitamin B12,  novolog ssi at meals, synthroid, KPhos, senna, thiamine, LR @ 10m, rocephin, prn oxycodone, prn miralax  Labs reviewed: BG:176-336, BUN:38, Cr:1.77, Phos:2.4, Mg:1.6, Alk Phos:139, AST:48, ALT:47, GFR:32, CK Total:260   NUTRITION - FOCUSED PHYSICAL EXAM:  Flowsheet Row Most Recent Value  Orbital Region Mild depletion  Upper Arm Region Moderate depletion  Thoracic and Lumbar Region Moderate depletion  Buccal Region Mild depletion  Temple Region Moderate depletion  Clavicle Bone Region Moderate depletion  Clavicle and Acromion Bone Region Mild depletion  Scapular Bone Region Mild depletion  Dorsal Hand Mild depletion  Patellar Region Moderate depletion  Anterior Thigh Region Moderate depletion  Posterior Calf Region Moderate depletion  Hair Reviewed  Eyes Reviewed  Mouth Reviewed  Skin Reviewed  Nails Reviewed       Diet Order:   Diet Order             Diet Carb Modified Fluid consistency: Thin; Room service appropriate? Yes  Diet effective now                   EDUCATION NEEDS:   Education needs have been addressed  Skin:  Skin Assessment: Skin Integrity Issues: Skin Integrity Issues:: DTI DTI: sacrum  Last BM:  11/19  Height:  Ht Readings from Last 1 Encounters:  04/15/22 _0  (1.651 m)   Weight:  Wt Readings from Last 1 Encounters:  04/19/22 58.2 kg    BMI:  Body mass index is 21.35 kg/m.  Estimated Nutritional Needs:  Kcal:  1455-1750kcal Protein:  70-85g Fluid:  1455-17570m KaCandise BowensMS, RD, LDN, CNSC See AMiON for contact information

## 2022-04-19 NOTE — Progress Notes (Addendum)
PROGRESS NOTE        PATIENT DETAILS Name: Anita [name] Age: 61 y.o. Sex: female Date of Birth: [birth year] Admit Date: 04/15/2022 Admitting Physician Brand Males, MD UEK:CMKLK, Purcell Nails, MD  Brief Summary: Patient is a 61 y.o. 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 coli pyelonephritis with bacteremia.  See below for further details.  Significant events: 11/16>> admit to ICU-severe encephalopathy-suspect possible seizures-fever. 11/17>> brief A-fib with RVR-spontaneously converted to sinus rhythm. 11/18>> transfer to [facility].  Significant studies: 11/16>> CSF protein 44, WBC 2 (in tube 4) 11/16>> CXR: No PNA 11/16>> CT head: No acute abnormality 11/16>> CT chest/abdomen/pelvis: Bilateral patchy groundglass opacities-likely atelectasis.  Mild perinephric/peripelvic stranding with a nonobstructive 9 mm stone in the left kidney.  Gas in the urinary bladder. 11/17>> MRI brain: No acute intracranial abnormality. 11/17>> LTM EEG: No seizures.  Significant microbiology data: 11/16>> COVID/influenza PCR: Negative 11/16>> blood culture: E. coli-sensitive to ceftriaxone and so also urine culture E. coli. 11/16>> CSF culture: Negative  Procedures: 11/16>> lumbar puncture.  Consults: Neurology PCCM  Subjective: Foley catheter removed this morning, yet to void.  Had some occipital/neck pain, chronic pain for her this morning and reportedly was tearful with RN.  Currently in good spirits.  No pain reported.  Anxious to DC but understands that she will be here at least 1 more day due to AKI.  Objective: Vitals:  Vitals:    04/18/22 2049 04/19/22 0424 04/19/22 0523 04/19/22 0946  BP: (!) 150/63  (!) 169/77 (!) 160/78  Pulse: 76  65 60  Resp: '18  18 17  '$ Temp: 99 F (37.2 C)  98.4 F (36.9 C) 98.4 F (36.9 C)  TempSrc: Oral  Oral   SpO2: 97%  97% 100%  Weight:  58.2 kg    Height:        Exam: General exam: Middle-age female, looks older than stated age, sitting up in bed without distress. Respiratory system: Clear to auscultation.  No increased work of breathing. Cardiovascular system: S1 & S2 heard, RRR. No JVD, murmurs, rubs, gallops or clicks. No pedal edema.  Telemetry personally reviewed: Sinus rhythm. Gastrointestinal system: Abdomen is nondistended, soft and nontender. No organomegaly or masses felt. Normal bowel sounds heard. Central nervous system: Alert and oriented to person, place and partly to time, April 01, 2022. No focal neurological deficits. Extremities: Symmetric 5 x 5 power. Skin: No rashes, lesions or ulcers Psychiatry: Judgement and insight appear normal. Mood & affect pleasant and appropriate.  Did not appear anxious or tearful during my visit..    Assessment/Plan: Severe sepsis (POA) E. coli left-sided pyelonephritis and bacteremia Sepsis physiology is resolved Continue IV Rocephin, day 5 today.  Continue treatment.  E. coli sensitive to ceftriaxone.  Consider total 7 days treatment.  As communicated with ID pharmacist, transitioned to cefadroxil on 11/20.  Acute metabolic encephalopathy Myoclonus CSF inconsistent with meningitis/encephalitis MRI brain/EEG negative-felt to have myoclonus from encephalopathy and not seizures. Felt to have severe encephalopathy/myoclonus in the setting of sepsis Encephalopathy resolved.  Mental status back to baseline. No further myoclonus noted since yesterday. Prior  TRH MD discussed with Dr. Delsa Grana further work-up/no AEDs needed Patient has no driver's license and does not drive. On high-dose IV thiamine and IM B12 shots.  AKI on CKD  stage IIIa AKI due to sepsis/obstructive uropathy Creatinine improving, but not back to baseline Avoid nephrotoxic agents Discontinued for early catheter 11/20 a.m., monitor for voiding. Last known creatinine prior to this admission was 1.08 on 11/13/2021.  Presented with creatinine of 4.08 which is gradually improved and is down to 1.69.Marland Kitchen  Creatinine up to 1.77 this morning.  Resumed IV fluids.   Trend daily BMP.  Acute urinary retention Per nursing staff-required numerous in/out cath-Foley finally replaced 11/17. Continue bethanechol for another day Discontinued Foley 11/20 and monitoring for voiding. Mobilize with PT/OT, have apparently seen but they have noted still pending so recommendations not known yet.  Rhabdomyolysis Likely due to myoclonus No evidence of seizures No evidence of trauma CK 7680 on 11/16.  CK down to 591 and 260 respectively.  Transaminitis Mild Either from rhabdomyolysis/shock liver Supportive care-follow LFTs, approach baseline.  PAF with RVR Brief episode of RVR 11/18 evening versus nonsustained SVT. Spontaneously converted to sinus rhythm Continue beta-blocker Given brief episode-ongoing thrombocytopenia-anticoagulation was held off earlier (CHA2DS2-VASc almost 5).  However now that her platelet counts have improved to 99, consulted cardiology input regarding long-term anticoagulation. TTE 11/18: Normal LVEF. Reviewed prior cardiology note April 2022-patient has had prior history of palpitations-Holter monitor only showed atrial tachycardia without any A-fib.  Normocytic anemia Due to critical illness No evidence of blood loss Follow CBC, stable. Transfuse if<7  Thrombocytopenia Due to sepsis/gram-negative bacteremia Platelet count improved to 99. See above regarding concerns to start anticoagulation for A-fib.  History of ASD repair November 1988  Nonobstructive CAD-on LHC  2012 Currently no anginal symptoms  History of lacunar  infarct Per prior cardiology note Unclear if this was asymptomatic-as patient does not remember having a CVA.  HTN BP fluctuating but mostly on the higher side Continue amlodipine, Imdur, metoprolol.  Resumed home dose of hydralazine 50 Mg twice daily.  Mostly systolic hypertension.  Hesitant to start HCTZ due to AKI. As needed IV hydralazine Better and stable.  DM-2 (A1c 5.2 on 6/14) CBG stable with SSI Repeat A1c, still pending. Resume oral hypoglycemic agents on discharge-but may need to adjust depending on A1c  HLD Continue statin-watch LFTs  Hypothyroidism Continue Synthroid TSH suppressed on 11/16 but this could be euthyroid syndrome Repeat TSH in several weeks as an outpatient.  Chronic pain syndrome Checked [place] PDMP and gets regular monthly opioid prescriptions.  Since encephalopathy resolved, titrate up opioid meds to home dose.  Increased Oxy IR to 10 Mg every 6 hourly as needed.  Patient takes 15 Mg every 6 hourly as needed.  Anxiety Apparently on chronic benzos at home-we will resume low-dose At risk for withdrawal seizures if benzos held for prolonged.   Increased Xanax to 0.5 Mg 3 times daily (takes 4 times daily at home).  Debility/deconditioning Due to acute illness PT/OT eval and recommend home health at DC.  Hypomagnesemia/hypophosphatemia: Replace and follow.  Pressure Ulcer: Pressure Injury 04/15/22 Sacrum Mid Deep Tissue Pressure Injury - Purple or maroon localized area of discolored intact skin or blood-filled blister due to damage of underlying soft tissue from pressure and/or shear. Blanchable redness around 2 cm DPI to  (Active)  04/15/22 1600  Location: Sacrum  Location Orientation: Mid  Staging: Deep Tissue Pressure Injury - Purple or maroon localized area of discolored intact skin or blood-filled  blister due to damage of underlying soft tissue from pressure and/or shear.  Wound Description (Comments): Blanchable redness around 2 cm DPI to sacruum   Present on Admission: Yes  Dressing Type Foam - Lift dressing to assess site every shift 04/19/22 0812    BMI: Estimated body mass index is 21.35 kg/m as calculated from the following:   Height as of this encounter: '5\' 5"'$  (1.651 m).   Weight as of this encounter: 58.2 kg.   Code status:   Code Status: Full Code   DVT Prophylaxis: heparin injection 5,000 Units Start: 04/16/22 1100    Family Communication:  Daughter-Heather [phone] -11/18.  None at bedside today.   Disposition Plan: Status is: Inpatient Remains inpatient appropriate because: Resolving sepsis physiology-noted stable for discharge.   Planned Discharge Destination: DC home with home health on 11/21 pending improvement in her creatinine, Cardiology consultation and voiding.   Diet: Diet Order             Diet Carb Modified Fluid consistency: Thin; Room service appropriate? Yes  Diet effective now                     Antimicrobial agents: Anti-infectives (From admission, onward)    Start     Dose/Rate Route Frequency Ordered Stop   04/17/22 0615  cefTRIAXone (ROCEPHIN) 2 g in sodium chloride 0.9 % 100 mL IVPB        2 g 200 mL/hr over 30 Minutes Intravenous Every 24 hours 04/16/22 0843     04/17/22 0603  cefTRIAXone (ROCEPHIN) 2 g in sodium chloride 0.9 % 100 mL IVPB  Status:  Discontinued        2 g 200 mL/hr over 30 Minutes Intravenous Every 24 hours 04/16/22 0838 04/16/22 0842   04/16/22 0600  cefTRIAXone (ROCEPHIN) 2 g in sodium chloride 0.9 % 100 mL IVPB  Status:  Discontinued        2 g 200 mL/hr over 30 Minutes Intravenous Every 12 hours 04/16/22 0124 04/16/22 0838   04/15/22 1639  vancomycin variable dose per unstable renal function (pharmacist dosing)  Status:  Discontinued         Does not apply See admin instructions 04/15/22 1639 04/16/22 0836   04/15/22 1530  vancomycin (VANCOCIN) IVPB 1000 mg/200 mL premix        1,000 mg 200 mL/hr over 60 Minutes Intravenous  Once 04/15/22 1517  04/15/22 1710   04/15/22 1515  cefTRIAXone (ROCEPHIN) 2 g in sodium chloride 0.9 % 100 mL IVPB        2 g 200 mL/hr over 30 Minutes Intravenous  Once 04/15/22 1514 04/15/22 1605        MEDICATIONS: Scheduled Meds:  ALPRAZolam  0.25 mg Oral BID   amLODipine  10 mg Oral Daily   aspirin EC  81 mg Oral Daily   atorvastatin  20 mg Oral Daily   Chlorhexidine Gluconate Cloth  6 each Topical Daily   cyanocobalamin  1,000 mcg Subcutaneous Daily   gabapentin  300 mg Oral BID   heparin injection (subcutaneous)  5,000 Units Subcutaneous Q8H   insulin aspart  0-5 Units Subcutaneous QHS   insulin aspart  0-9 Units Subcutaneous TID WC   isosorbide mononitrate  30 mg Oral Daily   levothyroxine  25 mcg Oral Q0600   metoprolol tartrate  100 mg Oral BID   nicotine  21 mg Transdermal Daily   phosphorus  250 mg Oral BID   polyethylene glycol  17 g Oral BID   senna-docusate  1 tablet Oral BID   sodium chloride flush  3 mL Intravenous Q12H   thiamine  100 mg Oral Daily   Continuous Infusions:  sodium chloride Stopped (04/16/22 1029)   cefTRIAXone (ROCEPHIN)  IV 2 g (04/19/22 0528)   lactated ringers 75 mL/hr at 04/19/22 0723   PRN Meds:.sodium chloride, docusate sodium, hydrALAZINE, oxyCODONE, polyethylene glycol   I have personally reviewed following labs and imaging studies  LABORATORY DATA: CBC: Recent Labs  Lab 04/15/22 1453 04/16/22 0046 04/16/22 0320 04/17/22 0721 04/18/22 0226 04/19/22 0237  WBC 6.2 22.1* 20.4* 10.5 7.7 7.3  NEUTROABS 5.9  --   --  9.2* 6.2  --   HGB 13.2 9.6* 8.7* 9.9* 11.0* 10.9*  HCT 41.5 29.6* 26.6* 31.5* 33.9* 34.7*  MCV 79.5* 79.8* 81.1 80.8 79.0* 81.5  PLT 84* 72* 70* 63* 69* 99*    Basic Metabolic Panel: Recent Labs  Lab 04/15/22 1453 04/15/22 1948 04/16/22 0046 04/16/22 0320 04/17/22 0721 04/18/22 0226 04/19/22 0237  NA 133*  --  136 138 142 140 141  K 4.0  --  3.9 3.9 3.7 3.3* 4.3  CL 98  --  103 103 108 103 102  CO2 20*  --  19* 20*  '23 25 25  '$ GLUCOSE 207*  --  192* 223* 184* 149* 194*  BUN 96*  --  81* 77* 54* 38* 38*  CREATININE 4.08*  --  3.52* 3.30* 2.14* 1.69* 1.77*  CALCIUM 8.8*  --  7.8* 8.0* 8.8* 9.1 8.8*  MG 2.4  --   --  2.1  --  1.7 1.6*  PHOS  --  2.8  --  4.8*  --   --  2.4*    GFR: Estimated Creatinine Clearance: 30 mL/min (A) (by C-G formula based on SCr of 1.77 mg/dL (H)).  Liver Function Tests: Recent Labs  Lab 04/15/22 1453 04/16/22 0046 04/17/22 0721 04/18/22 0226 04/19/22 0237  AST 130* 122* 117* 74* 48*  ALT 57* 46* 60* 54* 47*  ALKPHOS 173* 56 109 118 139*  BILITOT 1.0 1.0 0.4 1.1 0.6  PROT 7.7 5.3* 5.7* 6.1* 5.8*  ALBUMIN 3.5 2.2* 2.4* 2.6* 2.5*    Recent Labs  Lab 04/15/22 1800  AMMONIA 18    Cardiac Enzymes: Recent Labs  Lab 04/15/22 1453 04/18/22 1606 04/19/22 0237  CKTOTAL 7,680* 591* 260*    Urine analysis:    Component Value Date/Time   COLORURINE YELLOW 04/15/2022 1453   APPEARANCEUR CLOUDY (A) 04/15/2022 1453   LABSPEC 1.015 04/15/2022 1453   PHURINE 6.0 04/15/2022 1453   GLUCOSEU NEGATIVE 04/15/2022 1453   HGBUR LARGE (A) 04/15/2022 1453   BILIRUBINUR NEGATIVE 04/15/2022 1453   KETONESUR NEGATIVE 04/15/2022 1453   PROTEINUR 100 (A) 04/15/2022 1453   UROBILINOGEN 0.2 02/04/2015 1915   NITRITE NEGATIVE 04/15/2022 1453   LEUKOCYTESUR MODERATE (A) 04/15/2022 1453    Sepsis Labs: Lactic Acid, Venous    Component Value Date/Time   LATICACIDVEN 1.4 04/16/2022 0320    MICROBIOLOGY: Recent Results (from the past 240 hour(s))  Urine Culture     Status: Abnormal   Collection Time: 04/15/22  2:53 PM   Specimen: Urine, Catheterized  Result Value Ref Range Status   Specimen Description   Final    URINE, CATHETERIZED Performed at [facility], [address] [place], [place] [address]    Special Requests   Final    NONE Performed at [facility], [place]  [address] [place], [place] [address]    Culture >=100,000 COLONIES/mL ESCHERICHIA COLI (A)  Final    Report Status 04/17/2022 FINAL  Final   Organism ID, Bacteria ESCHERICHIA COLI (A)  Final      Susceptibility   Escherichia coli - MIC*    AMPICILLIN >=32 RESISTANT Resistant     CEFAZOLIN <=4 SENSITIVE Sensitive     CEFEPIME <=0.12 SENSITIVE Sensitive     CEFTRIAXONE <=0.25 SENSITIVE Sensitive     CIPROFLOXACIN >=4 RESISTANT Resistant     GENTAMICIN <=1 SENSITIVE Sensitive     IMIPENEM <=0.25 SENSITIVE Sensitive     NITROFURANTOIN <=16 SENSITIVE Sensitive     TRIMETH/SULFA 80 RESISTANT Resistant     AMPICILLIN/SULBACTAM >=32 RESISTANT Resistant     PIP/TAZO <=4 SENSITIVE Sensitive     * >=100,000 COLONIES/mL ESCHERICHIA COLI  Resp Panel by RT-PCR (Flu A&B, Covid) Anterior Nasal Swab     Status: None   Collection Time: 04/15/22  2:53 PM   Specimen: Anterior Nasal Swab  Result Value Ref Range Status   SARS Coronavirus 2 by RT PCR NEGATIVE NEGATIVE Final    Comment: (NOTE) SARS-CoV-2 target nucleic acids are NOT DETECTED.  The SARS-CoV-2 RNA is generally detectable in upper respiratory specimens during the acute phase of infection. The lowest concentration of SARS-CoV-2 viral copies this assay can detect is 138 copies/mL. A negative result does not preclude SARS-Cov-2 infection and should not be used as the sole basis for treatment or other patient management decisions. A negative result may occur with  improper specimen collection/handling, submission of specimen other than nasopharyngeal swab, presence of viral mutation(s) within the areas targeted by this assay, and inadequate number of viral copies(<138 copies/mL). A negative result must be combined with clinical observations, patient history, and epidemiological information. The expected result is Negative.  Fact Sheet for Patients:  EntrepreneurPulse.com.au  Fact Sheet for Healthcare Providers:  IncredibleEmployment.be  This test is no t yet approved or cleared by the [place]  FDA and  has been authorized for detection and/or diagnosis of SARS-CoV-2 by FDA under an Emergency Use Authorization (EUA). This EUA will remain  in effect (meaning this test can be used) for the duration of the COVID-19 declaration under Section 564(b)(1) of the Act, 21 U.S.C.section 360bbb-3(b)(1), unless the authorization is terminated  or revoked sooner.       Influenza A by PCR NEGATIVE NEGATIVE Final   Influenza B by PCR NEGATIVE NEGATIVE Final    Comment: (NOTE) The Xpert Xpress SARS-CoV-2/FLU/RSV plus assay is intended as an aid in the diagnosis of influenza from Nasopharyngeal swab specimens and should not be used as a sole basis for treatment. Nasal washings and aspirates are unacceptable for Xpert Xpress SARS-CoV-2/FLU/RSV testing.  Fact Sheet for Patients: EntrepreneurPulse.com.au  Fact Sheet for Healthcare Providers: IncredibleEmployment.be  This test is not yet approved or cleared by the [place] FDA and has been authorized for detection and/or diagnosis of SARS-CoV-2 by FDA under an Emergency Use Authorization (EUA). This EUA will remain in effect (meaning this test can be used) for the duration of the COVID-19 declaration under Section 564(b)(1) of the Act, 21 U.S.C. section 360bbb-3(b)(1), unless the authorization is terminated or revoked.  Performed at [facility], [address] [place], [place] [address]   Culture, blood (routine x 2)     Status: Abnormal   Collection Time: 04/15/22  3:01 PM   Specimen: BLOOD RIGHT FOREARM  Result Value Ref Range Status   Specimen Description  Final    BLOOD RIGHT FOREARM Performed at [facility], [address], [place], [place] [address]    Special Requests   Final    BOTTLES DRAWN AEROBIC AND ANAEROBIC Blood Culture adequate volume Performed at [facility], [address] [place], [place] [address]    Culture  Setup Time   Final    GRAM NEGATIVE RODS BOTTLES DRAWN  AEROBIC AND ANAEROBIC Gram Stain Report Called to,Read Back By and Verified With: SCHUMUTZ,K'@0440'$  BY MATTHEWS B 11.17.2023   Culture ESCHERICHIA COLI (A)  Final   Report Status 04/18/2022 FINAL  Final   Organism ID, Bacteria ESCHERICHIA COLI  Final      Susceptibility   Escherichia coli - MIC*    AMPICILLIN >=32 RESISTANT Resistant     CEFAZOLIN <=4 SENSITIVE Sensitive     CEFEPIME <=0.12 SENSITIVE Sensitive     CEFTAZIDIME <=1 SENSITIVE Sensitive     CEFTRIAXONE <=0.25 SENSITIVE Sensitive     CIPROFLOXACIN >=4 RESISTANT Resistant     GENTAMICIN <=1 SENSITIVE Sensitive     IMIPENEM <=0.25 SENSITIVE Sensitive     TRIMETH/SULFA 80 RESISTANT Resistant     AMPICILLIN/SULBACTAM >=32 RESISTANT Resistant     PIP/TAZO <=4 SENSITIVE Sensitive     * ESCHERICHIA COLI  Blood Culture ID Panel (Reflexed)     Status: Abnormal   Collection Time: 04/15/22  3:01 PM  Result Value Ref Range Status   Enterococcus faecalis NOT DETECTED NOT DETECTED Final   Enterococcus Faecium NOT DETECTED NOT DETECTED Final   Listeria monocytogenes NOT DETECTED NOT DETECTED Final   Staphylococcus species NOT DETECTED NOT DETECTED Final   Staphylococcus aureus (BCID) NOT DETECTED NOT DETECTED Final   Staphylococcus epidermidis NOT DETECTED NOT DETECTED Final   Staphylococcus lugdunensis NOT DETECTED NOT DETECTED Final   Streptococcus species NOT DETECTED NOT DETECTED Final   Streptococcus agalactiae NOT DETECTED NOT DETECTED Final   Streptococcus pneumoniae NOT DETECTED NOT DETECTED Final   Streptococcus pyogenes NOT DETECTED NOT DETECTED Final   A.calcoaceticus-baumannii NOT DETECTED NOT DETECTED Final   Bacteroides fragilis NOT DETECTED NOT DETECTED Final   Enterobacterales DETECTED (A) NOT DETECTED Final    Comment: Enterobacterales represent a large order of gram negative bacteria, not a single organism. CRITICAL RESULT CALLED TO, READ BACK BY AND VERIFIED WITH: J. FRENS PHARMD, AT 9323 04/16/22 BY D. VANHOOK     Enterobacter cloacae complex NOT DETECTED NOT DETECTED Final   Escherichia coli DETECTED (A) NOT DETECTED Final    Comment: CRITICAL RESULT CALLED TO, READ BACK BY AND VERIFIED WITH: J. FRENS PHARMD, AT 5573 04/16/22 BY D. VANHOOK    Klebsiella aerogenes NOT DETECTED NOT DETECTED Final   Klebsiella oxytoca NOT DETECTED NOT DETECTED Final   Klebsiella pneumoniae NOT DETECTED NOT DETECTED Final   Proteus species NOT DETECTED NOT DETECTED Final   Salmonella species NOT DETECTED NOT DETECTED Final   Serratia marcescens NOT DETECTED NOT DETECTED Final   Haemophilus influenzae NOT DETECTED NOT DETECTED Final   Neisseria meningitidis NOT DETECTED NOT DETECTED Final   Pseudomonas aeruginosa NOT DETECTED NOT DETECTED Final   Stenotrophomonas maltophilia NOT DETECTED NOT DETECTED Final   Candida albicans NOT DETECTED NOT DETECTED Final   Candida auris NOT DETECTED NOT DETECTED Final   Candida glabrata NOT DETECTED NOT DETECTED Final   Candida krusei NOT DETECTED NOT DETECTED Final   Candida parapsilosis NOT DETECTED NOT DETECTED Final   Candida tropicalis NOT DETECTED NOT DETECTED Final   Cryptococcus neoformans/gattii NOT DETECTED NOT  DETECTED Final   CTX-M ESBL NOT DETECTED NOT DETECTED Final   Carbapenem resistance IMP NOT DETECTED NOT DETECTED Final   Carbapenem resistance KPC NOT DETECTED NOT DETECTED Final   Carbapenem resistance NDM NOT DETECTED NOT DETECTED Final   Carbapenem resist OXA 48 LIKE NOT DETECTED NOT DETECTED Final   Carbapenem resistance VIM NOT DETECTED NOT DETECTED Final    Comment: Performed at [facility], [place] [address], [place], [place] [address]  Culture, blood (routine x 2)     Status: Abnormal   Collection Time: 04/15/22  3:37 PM   Specimen: BLOOD  Result Value Ref Range Status   Specimen Description   Final    BLOOD BLOOD RIGHT ARM Performed at [facility], [address] [place], [place] [address]    Special Requests   Final    BOTTLES DRAWN AEROBIC  AND ANAEROBIC Blood Culture adequate volume Performed at [facility], [address] [place], [place] [address]    Culture  Setup Time   Final    GRAM NEGATIVE RODS BOTTLES DRAWN AEROBIC AND ANAEROBIC Gram Stain Report Called to,Read Back By and Verified With: SCHMUTZ,K'@MOSES'$  CONE '@0440'$  BY MATTHEWS, B 11.17.2023 CRITICAL RESULT CALLED TO, READ BACK BY AND VERIFIED WITH: J. FRENS PHARMD, AT 1131 04/16/22 BY D. VANHOOK    Culture (A)  Final    ESCHERICHIA COLI SUSCEPTIBILITIES PERFORMED ON PREVIOUS CULTURE WITHIN THE LAST 5 DAYS. Performed at [facility], [place] [address] [place], [place] [address]    Report Status 04/18/2022 FINAL  Final  CSF culture     Status: None   Collection Time: 04/15/22  3:57 PM   Specimen: Back; Cerebrospinal Fluid  Result Value Ref Range Status   Specimen Description   Final    BACK Performed at [facility], [address], [place], [place] [address]    Special Requests   Final    NONE Performed at [facility], [address] [place], [place] [address]    Gram Stain   Final    WBC PRESENT, PREDOMINANTLY MONONUCLEAR NO ORGANISMS SEEN    Culture   Final    NO GROWTH 3 DAYS Performed at [facility], [place] [address] [place], [place] [address]    Report Status 04/19/2022 FINAL  Final  Gram stain     Status: None   Collection Time: 04/15/22  3:57 PM   Specimen: CSF; Cerebrospinal Fluid  Result Value Ref Range Status   Specimen Description CSF  Final   Special Requests NONE  Final   Gram Stain   Final    NO ORGANISMS SEEN WBC PRESENT, PREDOMINANTLY MONONUCLEAR Performed at [facility], [address], [place], [place] [address]    Report Status 04/15/2022 FINAL  Final  MRSA Next Gen by PCR, Nasal     Status: None   Collection Time: 04/15/22 10:18 PM   Specimen: Nasal Mucosa; Nasal Swab  Result Value Ref Range Status   MRSA by PCR Next Gen NOT DETECTED NOT DETECTED Final    Comment: (NOTE) The GeneXpert MRSA Assay (FDA approved for  NASAL specimens only), is one component of a comprehensive MRSA colonization surveillance program. It is not intended to diagnose MRSA infection nor to guide or monitor treatment for MRSA infections. Test performance is not FDA approved in patients less than 33 years old. Performed at [facility], [place] [address], [place],  [address]   Respiratory (~20 pathogens) panel by PCR  Status: None   Collection Time: 04/16/22  2:18 AM   Specimen: Nasopharyngeal Swab; Respiratory  Result Value Ref Range Status   Adenovirus NOT DETECTED NOT DETECTED Final   Coronavirus 229E NOT DETECTED NOT DETECTED Final    Comment: (NOTE) The Coronavirus on the Respiratory Panel, DOES NOT test for the novel  Coronavirus (2019 nCoV)    Coronavirus HKU1 NOT DETECTED NOT DETECTED Final   Coronavirus NL63 NOT DETECTED NOT DETECTED Final   Coronavirus OC43 NOT DETECTED NOT DETECTED Final   Metapneumovirus NOT DETECTED NOT DETECTED Final   Rhinovirus / Enterovirus NOT DETECTED NOT DETECTED Final   Influenza A NOT DETECTED NOT DETECTED Final   Influenza B NOT DETECTED NOT DETECTED Final   Parainfluenza Virus 1 NOT DETECTED NOT DETECTED Final   Parainfluenza Virus 2 NOT DETECTED NOT DETECTED Final   Parainfluenza Virus 3 NOT DETECTED NOT DETECTED Final   Parainfluenza Virus 4 NOT DETECTED NOT DETECTED Final   Respiratory Syncytial Virus NOT DETECTED NOT DETECTED Final   Bordetella pertussis NOT DETECTED NOT DETECTED Final   Bordetella Parapertussis NOT DETECTED NOT DETECTED Final   Chlamydophila pneumoniae NOT DETECTED NOT DETECTED Final   Mycoplasma pneumoniae NOT DETECTED NOT DETECTED Final    Comment: Performed at [facility], [address] [place], [place] [address]    RADIOLOGY STUDIES/RESULTS: ECHOCARDIOGRAM COMPLETE  Result Date: 04/17/2022    ECHOCARDIOGRAM REPORT   Patient Name:   Anita [name] Date of Exam: 04/17/2022 Medical Rec #:  [id number]    Height:       65.0 in  Accession #:    [id number]   Weight:       128.3 lb Date of Birth:  1961-01-23     BSA:          1.638 m Patient Age:    71 years     BP:           178/76 mmHg Patient Gender: F            HR:           72 bpm. Exam Location:  Inpatient Procedure: 2D Echo Indications:    atrial fibrillation  History:        Patient has prior history of Echocardiogram examinations, most                 recent 08/10/2018. CAD; Risk Factors:Hypertension, Diabetes and                 Dyslipidemia.  Sonographer:    Harvie Junior Referring Phys: [place]  Sonographer Comments: Technically difficult study due to poor echo windows. AMS. IMPRESSIONS  1. Left ventricular ejection fraction, by estimation, is 60 to 65%. The left ventricle has normal function. The left ventricle has no regional wall motion abnormalities. Left ventricular diastolic parameters were normal.  2. Right ventricular systolic function is normal. The right ventricular size is normal. There is normal pulmonary artery systolic pressure.  3. The mitral valve is normal in structure. Trivial mitral valve regurgitation. No evidence of mitral stenosis.  4. The aortic valve has an indeterminant number of cusps. Aortic valve regurgitation is not visualized. No aortic stenosis is present.  5. The inferior vena cava is normal in size with greater than 50% respiratory variability, suggesting right atrial pressure of 3 mmHg. FINDINGS  Left Ventricle: Left ventricular ejection fraction, by estimation, is 60 to 65%. The left ventricle has normal function. The left ventricle has  no regional wall motion abnormalities. The left ventricular internal cavity size was normal in size. There is  no left ventricular hypertrophy. Left ventricular diastolic parameters were normal. Right Ventricle: The right ventricular size is normal. Right ventricular systolic function is normal. There is normal pulmonary artery systolic pressure. The tricuspid regurgitant velocity is 2.66 m/s, and  with an assumed right atrial pressure of 3 mmHg,  the estimated right ventricular systolic pressure is 16.1 mmHg. Left Atrium: Left atrial size was normal in size. Right Atrium: Right atrial size was normal in size. Pericardium: There is no evidence of pericardial effusion. Mitral Valve: The mitral valve is normal in structure. Trivial mitral valve regurgitation. No evidence of mitral valve stenosis. Tricuspid Valve: The tricuspid valve is normal in structure. Tricuspid valve regurgitation is trivial. No evidence of tricuspid stenosis. Aortic Valve: The aortic valve has an indeterminant number of cusps. Aortic valve regurgitation is not visualized. No aortic stenosis is present. Aortic valve mean gradient measures 3.0 mmHg. Aortic valve peak gradient measures 5.6 mmHg. Aortic valve area, by VTI measures 1.85 cm. Pulmonic Valve: The pulmonic valve was not well visualized. Pulmonic valve regurgitation is not visualized. No evidence of pulmonic stenosis. Aorta: The aortic root is normal in size and structure. Venous: The inferior vena cava is normal in size with greater than 50% respiratory variability, suggesting right atrial pressure of 3 mmHg. IAS/Shunts: No atrial level shunt detected by color flow Doppler.  LEFT VENTRICLE PLAX 2D LVIDd:         4.00 cm      Diastology LVIDs:         2.50 cm      LV e' medial:    7.83 cm/s LV PW:         1.00 cm      LV E/e' medial:  10.8 LV IVS:        0.80 cm      LV e' lateral:   9.57 cm/s LVOT diam:     1.60 cm      LV E/e' lateral: 8.9 LV SV:         47 LV SV Index:   29 LVOT Area:     2.01 cm  LV Volumes (MOD) LV vol d, MOD A2C: 54.1 ml LV vol d, MOD A4C: 112.0 ml LV vol s, MOD A2C: 26.5 ml LV vol s, MOD A4C: 39.1 ml LV SV MOD A2C:     27.6 ml LV SV MOD A4C:     112.0 ml LV SV MOD BP:      53.4 ml RIGHT VENTRICLE RV S prime:     12.90 cm/s TAPSE (M-mode): 1.3 cm LEFT ATRIUM           Index LA diam:      3.20 cm 1.95 cm/m LA Vol (A2C): 25.0 ml 15.26 ml/m LA Vol (A4C): 49.6  ml 30.28 ml/m  AORTIC VALVE                    PULMONIC VALVE AV Area (Vmax):    1.87 cm     PV Vmax:       0.98 m/s AV Area (Vmean):   1.80 cm     PV Peak grad:  3.8 mmHg AV Area (VTI):     1.85 cm AV Vmax:           118.00 cm/s AV Vmean:          85.500 cm/s AV VTI:  0.257 m AV Peak Grad:      5.6 mmHg AV Mean Grad:      3.0 mmHg LVOT Vmax:         110.00 cm/s LVOT Vmean:        76.400 cm/s LVOT VTI:          0.236 m LVOT/AV VTI ratio: 0.92  AORTA Ao Root diam: 2.70 cm MITRAL VALVE               TRICUSPID VALVE MV Area (PHT): 3.60 cm    TR Peak grad:   28.3 mmHg MV Decel Time: 211 msec    TR Vmax:        266.00 cm/s MR Peak grad: 39.2 mmHg MR Vmax:      313.00 cm/s  SHUNTS MV E velocity: 84.80 cm/s  Systemic VTI:  0.24 m MV A velocity: 73.70 cm/s  Systemic Diam: 1.60 cm MV E/A ratio:  1.15 Kirk Ruths MD Electronically signed by Kirk Ruths MD Signature Date/Time: 04/17/2022/3:31:04 PM    Final      LOS: 4 days   Vernell Leep, MD,  FACP, FHM, [facility], [facility], [place]     To contact the attending provider between 7A-7P or the covering provider during after hours 7P-7A, please log into the web site [email or web address] and access using universal  password for that web site. If you do not have the password, please call the hospital operator.

## 2022-04-19 NOTE — Care Management Important Message (Signed)
Important Message  Patient Details  Name: Anita [name] MRN: [id number] Date of Birth: [birth year]   Medicare Important Message Given:  Yes     Edoardo Laforte 04/19/2022, 3:53 PM

## 2022-04-19 NOTE — Progress Notes (Addendum)
Physical Therapy Treatment and Discharge Patient Details Name: Anita [name] MRN: [id number] DOB: February 25, 1961 Today's Date: 04/19/2022   History of Present Illness Patient is a 61 y.o.  female with history of CAD, lacunar stroke, HTN, DM-2, CKD stage IIIa, hypothyroidism, chronic pain-presented with acute metabolic encephalopathy/seizure-like activity-upon further evaluation-she was found to have pyelonephritis with gram-negative bacteremia.    PT Comments    Continuing work on functional mobility and activity tolerance;  Session focused on progressive amb and stairs check off in prep for dc home; Acute PT goals met, functional mobility is adequate for DC; PT to sign off, and as Mobility Specialists to work with pt  Recommendations for follow up therapy are one component of a multi-disciplinary discharge planning process, led by the attending physician.  Recommendations may be updated based on patient status, additional functional criteria and insurance authorization.  Follow Up Recommendations  Home health PT     Assistance Recommended at Discharge Set up Supervision/Assistance  Patient can return home with the following Assistance with cooking/housework;Assist for transportation   Equipment Recommendations  BSC/3in1    Recommendations for Other Services  Mobility Team     Precautions / Restrictions Precautions Precautions: Fall Precaution Comments: fall risk is present, but greatly reduced with use of RW Restrictions Weight Bearing Restrictions: No     Mobility  Bed Mobility Overal bed mobility: Modified Independent Bed Mobility: Supine to Sit     Supine to sit: Modified independent (Device/Increase time)     General bed mobility comments: No observed difficulty    Transfers Overall transfer level: Needs assistance Equipment used: Rolling walker (2 wheels) Transfers: Sit to/from Stand Sit to Stand: Supervision           General transfer comment: Needing  occasional cues for hand placement    Ambulation/Gait Ambulation/Gait assistance: Modified independent (Device/Increase time) Gait Distance (Feet): 350 Feet Assistive device: Rolling walker (2 wheels) Gait Pattern/deviations: Step-through pattern       General Gait Details: Managing well   Stairs Stairs: Yes Stairs assistance: Supervision Stair Management: One rail Left, Alternating pattern Number of Stairs: 4 General stair comments: No difficulty   Wheelchair Mobility    Modified Rankin (Stroke Patients Only)       Balance Overall balance assessment: History of Falls, Mild deficits observed, not formally tested                                          Cognition Arousal/Alertness: Awake/alert Behavior During Therapy: WFL for tasks assessed/performed Overall Cognitive Status: Within Functional Limits for tasks assessed                                          Exercises      General Comments General comments (skin integrity, edema, etc.): Needed cues for pathfinding back to her room      Pertinent Vitals/Pain Pain Assessment Pain Assessment: No/denies pain    Home Living                          Prior Function            PT Goals (current goals can now be found in the care plan section) Acute Rehab PT Goals Patient Stated Goal: Hopes to get  home soon PT Goal Formulation: All assessment and education complete, DC therapy Progress towards PT goals: Goals met/education completed, patient discharged from PT    Frequency    Min 3X/week      PT Plan Current plan remains appropriate    Co-evaluation              AM-PAC PT "6 Clicks" Mobility   Outcome Measure  Help needed turning from your back to your side while in a flat bed without using bedrails?: None Help needed moving from lying on your back to sitting on the side of a flat bed without using bedrails?: None Help needed moving to and from  a bed to a chair (including a wheelchair)?: None Help needed standing up from a chair using your arms (e.g., wheelchair or bedside chair)?: None Help needed to walk in hospital room?: None Help needed climbing 3-5 steps with a railing? : A Little 6 Click Score: 23    End of Session   Activity Tolerance: Patient tolerated treatment well Patient left: in chair;with call bell/phone within reach Nurse Communication: Mobility status PT Visit Diagnosis: Unsteadiness on feet (R26.81);Other abnormalities of gait and mobility (R26.89);Muscle weakness (generalized) (M62.81)     Time: 1040-4591 PT Time Calculation (min) (ACUTE ONLY): 23 min  Charges:  $Gait Training: 23-37 mins                     Roney Marion, Anita [name] [phone]    Colletta Maryland 04/19/2022, 2:59 PM

## 2022-04-19 NOTE — Progress Notes (Signed)
Mobility Specialist Progress Note:   04/19/22 1527  Mobility  Activity Ambulated independently in hallway  Level of Assistance Modified independent, requires aide device or extra time  Assistive Device Front wheel walker  Distance Ambulated (ft) 300 ft  Activity Response Tolerated well  Mobility Referral Yes  $Mobility charge 1 Mobility   Pt received sitting EOB and agreeable. No complaints. Pt left sitting EOB with all needs met and call bell in reach.   Andrey Campanile Mobility Specialist Please contact via SecureChat or  Rehab office at [phone]

## 2022-04-20 ENCOUNTER — Other Ambulatory Visit (HOSPITAL_COMMUNITY): Payer: Self-pay

## 2022-04-20 DIAGNOSIS — R7881 Bacteremia: Secondary | ICD-10-CM | POA: Diagnosis not present

## 2022-04-20 DIAGNOSIS — N179 Acute kidney failure, unspecified: Secondary | ICD-10-CM | POA: Diagnosis not present

## 2022-04-20 DIAGNOSIS — G9341 Metabolic encephalopathy: Secondary | ICD-10-CM | POA: Diagnosis not present

## 2022-04-20 LAB — COMPREHENSIVE METABOLIC PANEL
ALT: 43 U/L (ref 0–44)
AST: 38 U/L (ref 15–41)
Albumin: 2.5 g/dL — ABNORMAL LOW (ref 3.5–5.0)
Alkaline Phosphatase: 108 U/L (ref 38–126)
Anion gap: 9 (ref 5–15)
BUN: 30 mg/dL — ABNORMAL HIGH (ref 8–23)
CO2: 25 mmol/L (ref 22–32)
Calcium: 8.4 mg/dL — ABNORMAL LOW (ref 8.9–10.3)
Chloride: 103 mmol/L (ref 98–111)
Creatinine, Ser: 1.38 mg/dL — ABNORMAL HIGH (ref 0.44–1.00)
GFR, Estimated: 44 mL/min — ABNORMAL LOW (ref >60)
Glucose, Bld: 152 mg/dL — ABNORMAL HIGH (ref 70–99)
Potassium: 3.9 mmol/L (ref 3.5–5.1)
Sodium: 137 mmol/L (ref 135–145)
Total Bilirubin: 0.6 mg/dL (ref 0.3–1.2)
Total Protein: 5.7 g/dL — ABNORMAL LOW (ref 6.5–8.1)

## 2022-04-20 LAB — HEMOGLOBIN A1C
Hgb A1c MFr Bld: 7.5 % — ABNORMAL HIGH (ref 4.8–5.6)
Mean Plasma Glucose: 169 mg/dL

## 2022-04-20 LAB — GLUCOSE, CAPILLARY
Glucose-Capillary: 192 mg/dL — ABNORMAL HIGH (ref 70–99)
Glucose-Capillary: 229 mg/dL — ABNORMAL HIGH (ref 70–99)
Glucose-Capillary: 238 mg/dL — ABNORMAL HIGH (ref 70–99)

## 2022-04-20 LAB — VITAMIN B1: Vitamin B1 (Thiamine): 142 nmol/L (ref 66.5–200.0)

## 2022-04-20 LAB — MAGNESIUM: Magnesium: 2.1 mg/dL (ref 1.7–2.4)

## 2022-04-20 LAB — PHOSPHORUS: Phosphorus: 3.8 mg/dL (ref 2.5–4.6)

## 2022-04-20 MED ORDER — VITAMIN B-12 1000 MCG PO TABS
1000.0000 ug | ORAL_TABLET | Freq: Every day | ORAL | 1 refills | Status: DC
  Filled 2022-04-20: qty 30, 30d supply, fill #0

## 2022-04-20 MED ORDER — THIAMINE HCL 100 MG PO TABS
100.0000 mg | ORAL_TABLET | Freq: Every day | ORAL | 1 refills | Status: DC
  Filled 2022-04-20: qty 30, 30d supply, fill #0

## 2022-04-20 MED ORDER — CEFADROXIL 500 MG PO CAPS
500.0000 mg | ORAL_CAPSULE | Freq: Two times a day (BID) | ORAL | 0 refills | Status: AC
  Filled 2022-04-20: qty 4, 2d supply, fill #0

## 2022-04-20 MED ORDER — GLIPIZIDE 5 MG PO TABS: 5.0000 mg | ORAL_TABLET | Freq: Two times a day (BID) | ORAL | Status: DC

## 2022-04-20 MED ORDER — ADULT MULTIVITAMIN W/MINERALS CH: 1.0000 | ORAL_TABLET | Freq: Every day | ORAL | Status: DC

## 2022-04-20 MED ORDER — NICOTINE 21 MG/24HR TD PT24
21.0000 mg | MEDICATED_PATCH | Freq: Every day | TRANSDERMAL | 0 refills | Status: DC
  Filled 2022-04-20: qty 28, 28d supply, fill #0

## 2022-04-20 MED ORDER — POLYETHYLENE GLYCOL 3350 17 GM/SCOOP PO POWD
17.0000 g | Freq: Every day | ORAL | 0 refills | Status: DC
  Filled 2022-04-20: qty 238, 14d supply, fill #0

## 2022-04-20 NOTE — Inpatient Diabetes Management (Signed)
Inpatient Diabetes Program Recommendations  AACE/ADA: New Consensus Statement on Inpatient Glycemic Control (2015)  Target Ranges:  Prepandial:   less than 140 mg/dL      Peak postprandial:   less than 180 mg/dL (1-2 hours)      Critically ill patients:  140 - 180 mg/dL   Lab Results  Component Value Date   GLUCAP 192 (H) 04/20/2022   HGBA1C 7.5 (H) 04/18/2022    Review of Glycemic Control  Latest Reference Range & Units 04/19/22 07:29 04/19/22 11:36 04/19/22 16:29 04/19/22 21:17 04/20/22 07:20  Glucose-Capillary 70 - 99 mg/dL 176 (H) 326 (H) 216 (H) 274 (H) 192 (H)  (H): Data is abnormally high  Diabetes history: DM2 Outpatient Diabetes medications: Glucotrol 5 mg TID, Metformin 500 mg BID Current orders for Inpatient glycemic control: Novolog 0-9 units TID and 0-5 units QHS  Inpatient Diabetes Program Recommendations:    Please consider:  Post prandial glucose elevated-  Novolog 3 units TID if consumes at least 50%  Will continue to follow while inpatient.  Thank you, Reche Dixon, MSN, [place] Diabetes Coordinator Inpatient Diabetes Program [phone] (team pager from 8a-5p)

## 2022-04-20 NOTE — Progress Notes (Signed)
    Durable Medical Equipment  (From admission, onward)           Start     Ordered   04/20/22 1423  For home use only DME Bedside commode  Once       Comments: Bathroom not at level of living.  Question:  Patient needs a bedside commode to treat with the following condition  Answer:  Weakness   04/20/22 1423   04/20/22 1229  DME 3-in-1  Once        04/20/22 1230

## 2022-04-20 NOTE — Discharge Summary (Signed)
Physician Discharge Summary  Anita [name]:[id number] DOB: November 25, 1960  PCP: Redmond School, MD  Admitted from: Home Discharged to: Home  Admit date: 04/15/2022 Discharge date: 04/20/2022  Recommendations for Outpatient Follow-up:    Follow-up Information     [facility] [place] Follow up.   Why: Someone from the office at [facility] will call to schedule home health visits Contact information: [place] [address] [phone]         Redmond School, MD. Schedule an appointment as soon as possible for a visit in 1 week(s).   Specialty: Internal Medicine Why: To be seen with repeat labs (CBC, CMP, magnesium and phosphorus). Contact information: [address] [place] [address] [phone]         Belva Crome, MD Follow up.   Specialty: Cardiology Why: Office will arrange for outpatient heart monitor and office visit.  Please call them if you do not hear from them in 3-5 business days. Contact information: [address] [address] [place] Orders (From admission, onward)     Start     Ordered   04/20/22 [place]  At discharge       Question Answer Comment  To provide the following care/treatments PT   To provide the following care/treatments OT      04/20/22 1230             Equipment/Devices:     Durable Medical Equipment  (From admission, onward)           Start     Ordered   04/20/22 1229  DME 3-in-1  Once        04/20/22 1230             Discharge Condition: Improved and stable.   Code Status: Full Code Diet recommendation:  Discharge Diet Orders (From admission, onward)     Start     Ordered   04/20/22 0000  Diet - low sodium heart healthy        04/20/22 1230   04/20/22 0000  Diet Carb Modified        04/20/22 1230             Discharge Diagnoses:  Principal Problem:   Acute  metabolic encephalopathy Active Problems:   Seizure ([place])   Non-traumatic rhabdomyolysis   Brief Summary: Patient is a 61 y.o. 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 coli pyelonephritis with bacteremia.  See below for further details.   Significant events: 11/16>> admit to ICU-severe encephalopathy-suspect possible seizures-fever. 11/17>> brief A-fib with RVR-spontaneously converted to sinus rhythm. 11/18>> transfer to [facility].   Significant studies: 11/16>> CSF protein 44, WBC 2 (in tube 4) 11/16>> CXR: No PNA 11/16>> CT head: No acute abnormality 11/16>> CT chest/abdomen/pelvis: Bilateral patchy groundglass opacities-likely atelectasis.  Mild perinephric/peripelvic stranding with a nonobstructive 9 mm stone in the left kidney.  Gas in the urinary bladder. 11/17>> MRI brain: No acute intracranial abnormality. 11/17>> LTM EEG: No  seizures.   Significant microbiology data: 11/16>> COVID/influenza PCR: Negative 11/16>> blood culture: E. coli-sensitive to ceftriaxone and so also urine culture E. coli. 11/16>> CSF culture: Negative   Procedures: 11/16>> lumbar puncture.   Consults: Neurology PCCM  Assessment/Plan: Severe sepsis (POA) E. coli left-sided pyelonephritis and bacteremia Sepsis physiology has resolved Completed 4 dose of IV ceftriaxone.  As communicated with ID pharmacist, transitioned to cefadroxil on 11/20, to complete total 7 days course.     Acute metabolic encephalopathy Myoclonus CSF inconsistent with meningitis/encephalitis MRI brain/EEG  negative-felt to have myoclonus from encephalopathy and not seizures. Felt to have severe encephalopathy/myoclonus in the setting of sepsis Encephalopathy resolved.  Mental status back to baseline. No further myoclonus for last several days. Prior TRH MD discussed with Dr. Delsa Grana further work-up/no AEDs needed Patient has no driver's license and does not drive. On high-dose IV thiamine and IM B12 shots.  At DC continue oral thiamine and B12 supplements.  Recommend repeating B12 levels in a month or 2. Resolved.   AKI on CKD stage IIIa AKI due to sepsis/obstructive uropathy Creatinine improving, approaching baseline. Avoid nephrotoxic agents Discontinued for early catheter 11/20 a.m., and voiding without difficulty the, confirmed with RN. Last known creatinine prior to this admission was 1.08 on 11/13/2021.  Presented with creatinine of 4.08 which gradually improved.  After IV fluids, creatinine down to 1.38 on day of discharge.  Follow BMP as outpatient closely.   Acute urinary retention Per nursing staff-required numerous in/out cath-Foley finally replaced 11/17. Completed bethanechol Discontinued Foley 11/20 and voiding without difficulty Mobilize with PT/OT, have recommended home health PT and T   Rhabdomyolysis Likely due to myoclonus No evidence of seizures No evidence of trauma CK 7680 on 11/16.  CK down to 591 and 260 respectively.   Transaminitis Mild Either from rhabdomyolysis/shock liver Resolved.   PAF with RVR Brief episode of RVR 11/18 evening versus nonsustained SVT. Spontaneously converted to sinus rhythm Continue beta-blocker Given brief episode-ongoing thrombocytopenia-anticoagulation was held off earlier (CHA2DS2-VASc almost 5).  However now that her platelet counts have improved to 99, consulted cardiology input regarding long-term anticoagulation. TTE 11/18: Normal LVEF. Reviewed prior cardiology note April 2022-patient has had prior history of  palpitations-Holter monitor only showed atrial tachycardia without any A-fib. As per cardiology consultation 11/20, recommend avoiding use of anticoagulation unless there is well-documented evidence of prolonged atrial fibrillation.  The plan to set her up for Zio patch monitor as an outpatient for 14 days.   Normocytic anemia Due to critical illness No evidence of blood loss Follow CBC, stable. Transfuse if<7   Thrombocytopenia Due to sepsis/gram-negative bacteremia Platelet count improved to 99. See above regarding concerns to start anticoagulation for A-fib.  Follow CBC as outpatient.   History of ASD repair November 1988   Nonobstructive CAD-on LHC  2012 Currently no anginal symptoms   History of lacunar infarct Per prior cardiology note Unclear if this was asymptomatic-as patient does not remember having a CVA.   HTN Continue amlodipine, Imdur, metoprolol and hydralazine Controlled.   DM-2 (A1c 5.2 on 6/14) CBG stable with SSI A1c 7.5. Treated in the hospital with SSI.  At time of discharge, continue prior home dose of metformin 500 Mg twice daily and changed glipizide from 5 mg 3 times daily to 5 Mg twice daily until close outpatient follow-up with PCP.  Further dose adjustments of her oral hypoglycemics defer to PCP.   HLD Continue statin   Hypothyroidism Continue Synthroid TSH suppressed on 11/16 but this  could be euthyroid syndrome Repeat TSH in several weeks as an outpatient.  T3 was normal.  Thyroid peroxidase antibody and thyroglobulin antibodies were negative.  Free T4 was elevated at 1.28, mildly.   Chronic pain syndrome Checked [place] PDMP and gets regular monthly opioid prescriptions.  Since encephalopathy resolved, titrate up opioid meds to home dose at time of discharge.   Anxiety Apparently on chronic benzos at home-we will resume low-dose At risk for withdrawal seizures if benzos held for prolonged.   Increased Xanax to 0.5 Mg 3 times daily (takes 4 times  daily at home).  At time of discharge, resume prior home dose of Xanax.   Debility/deconditioning Due to acute illness PT/OT eval and recommend home health at DC.   Hypomagnesemia/hypophosphatemia: Replaced.   Pressure Ulcer:     Pressure Injury 04/15/22 Sacrum Mid Deep Tissue Pressure Injury - Purple or maroon localized area of discolored intact skin or blood-filled blister due to damage of underlying soft tissue from pressure and/or shear. Blanchable redness around 2 cm DPI to  (Active)  04/15/22 1600  Location: Sacrum  Location Orientation: Mid  Staging: Deep Tissue Pressure Injury - Purple or maroon localized area of discolored intact skin or blood-filled blister due to damage of underlying soft tissue from pressure and/or shear.  Wound Description (Comments): Blanchable redness around 2 cm DPI to sacruum  Present on Admission: Yes  Dressing Type Foam - Lift dressing to assess site every shift 04/19/22 0812      BMI: Estimated body mass index is 21.35 kg/m as calculated from the following:   Height as of this encounter: '5\' 5"'$  (1.651 m).   Weight as of this encounter: 58.2 kg.   Discharge Instructions  Discharge Instructions     Call MD for:   Complete by: As directed    Recurrent confusion or mental status changes.   Call MD for:  difficulty breathing, headache or visual disturbances   Complete by: As directed    Call MD for:  extreme fatigue   Complete by: As directed    Call MD for:  persistant dizziness or light-headedness   Complete by: As directed    Call MD for:  persistant nausea and vomiting   Complete by: As directed    Call MD for:  severe uncontrolled pain   Complete by: As directed    Call MD for:  temperature >100.4   Complete by: As directed    Diet - low sodium heart healthy   Complete by: As directed    Diet Carb Modified   Complete by: As directed    Increase activity slowly   Complete by: As directed    No wound care   Complete by: As  directed         Medication List     STOP taking these medications    Potassium 99 MG Tabs       TAKE these medications    acetaminophen 325 MG tablet Commonly known as: TYLENOL Take 2 tablets (650 mg total) by mouth every 6 (six) hours as needed for mild pain, headache or fever (or Fever >/= 101).   ALPRAZolam 1 MG tablet Commonly known as: XANAX Take 0.5 mg by mouth in the morning, at noon, in the evening, and at bedtime.   amLODipine 10 MG tablet Commonly known as: NORVASC Take 1 tablet (10 mg total) by mouth daily.   ascorbic acid 500 MG tablet Commonly known as: VITAMIN C Take 1 tablet (500 mg  total) by mouth daily.   aspirin EC 81 MG tablet Take 1 tablet (81 mg total) by mouth daily with breakfast.   atorvastatin 20 MG tablet Commonly known as: LIPITOR Take 20 mg by mouth daily. 1 Tablet Daily   cefadroxil 500 MG capsule Commonly known as: DURICEF Take 1 capsule (500 mg total) by mouth 2 (two) times daily for 2 days.   cyanocobalamin 1000 MCG tablet Commonly known as: VITAMIN B12 Take 1 tablet (1,000 mcg total) by mouth daily.   EPINEPHrine 0.3 mg/0.3 mL Soaj injection Commonly known as: EPI-PEN Inject 0.3 mg into the muscle as needed for anaphylaxis. Inject as needed for anaphlaxis   gabapentin 600 MG tablet Commonly known as: NEURONTIN Take 600 mg by mouth 3 (three) times daily.   glipiZIDE 5 MG tablet Commonly known as: GLUCOTROL Take 1 tablet (5 mg total) by mouth 2 (two) times daily before a meal. What changed: when to take this   hydrALAZINE 50 MG tablet Commonly known as: APRESOLINE Take 1 tablet (50 mg total) by mouth 2 (two) times daily.   isosorbide mononitrate 30 MG 24 hr tablet Commonly known as: IMDUR Take 1 tablet (30 mg total) by mouth daily.   levothyroxine 25 MCG tablet Commonly known as: SYNTHROID Take 25 mcg by mouth daily.   metFORMIN 500 MG tablet Commonly known as: GLUCOPHAGE Take 500 mg by mouth 2 (two) times  daily with a meal.   metoprolol tartrate 100 MG tablet Commonly known as: LOPRESSOR Take 100 mg by mouth 2 (two) times a day.   multivitamin with minerals Tabs tablet Take 1 tablet by mouth daily. Start taking on: April 21, 2022   nicotine 21 mg/24hr patch Commonly known as: NICODERM CQ - dosed in mg/24 hours Place 1 patch (21 mg total) onto the skin daily. Start taking on: April 21, 2022   oxyCODONE 5 MG immediate release tablet Commonly known as: Oxy IR/ROXICODONE Take 15 mg by mouth every 6 (six) hours as needed (pain.). What changed: Another medication with the same name was removed. Continue taking this medication, and follow the directions you see here.   polyethylene glycol powder 17 GM/SCOOP powder Commonly known as: GLYCOLAX/MIRALAX Take 17 g by mouth daily.   thiamine 100 MG tablet Commonly known as: VITAMIN B1 Take 1 tablet (100 mg total) by mouth daily. Start taking on: April 21, 2022   tiZANidine 4 MG tablet Commonly known as: ZANAFLEX Take 1 tablet (4 mg total) by mouth at bedtime.   Vitamin D-3 125 MCG (5000 UT) Tabs Take 5,000 Units by mouth daily.   zinc sulfate 220 (50 Zn) MG capsule Take 1 capsule (220 mg total) by mouth daily.       Allergies  Allergen Reactions   Ramipril     Possible ACE-I induced angioedema    Imitrex [Sumatriptan] Swelling    States she had swelling in neck   Lyrica [Pregabalin] Other (See Comments)    Severe Leg and feet swelling   Penicillins Rash      Procedures/Studies: ECHOCARDIOGRAM COMPLETE  Result Date: 04/17/2022    ECHOCARDIOGRAM REPORT   Patient Name:   Anita [name] Date of Exam: 04/17/2022 Medical Rec #:  [id number]    Height:       65.0 in Accession #:    [id number]   Weight:       128.3 lb Date of Birth:  [birth year]     BSA:          1.638  m Patient Age:    66 years     BP:           178/76 mmHg Patient Gender: F            HR:           72 bpm. Exam Location:  Inpatient Procedure: 2D Echo  Indications:    atrial fibrillation  History:        Patient has prior history of Echocardiogram examinations, most                 recent 08/10/2018. CAD; Risk Factors:Hypertension, Diabetes and                 Dyslipidemia.  Sonographer:    Harvie Junior Referring Phys: [place]  Sonographer Comments: Technically difficult study due to poor echo windows. AMS. IMPRESSIONS  1. Left ventricular ejection fraction, by estimation, is 60 to 65%. The left ventricle has normal function. The left ventricle has no regional wall motion abnormalities. Left ventricular diastolic parameters were normal.  2. Right ventricular systolic function is normal. The right ventricular size is normal. There is normal pulmonary artery systolic pressure.  3. The mitral valve is normal in structure. Trivial mitral valve regurgitation. No evidence of mitral stenosis.  4. The aortic valve has an indeterminant number of cusps. Aortic valve regurgitation is not visualized. No aortic stenosis is present.  5. The inferior vena cava is normal in size with greater than 50% respiratory variability, suggesting right atrial pressure of 3 mmHg. FINDINGS  Left Ventricle: Left ventricular ejection fraction, by estimation, is 60 to 65%. The left ventricle has normal function. The left ventricle has no regional wall motion abnormalities. The left ventricular internal cavity size was normal in size. There is  no left ventricular hypertrophy. Left ventricular diastolic parameters were normal. Right Ventricle: The right ventricular size is normal. Right ventricular systolic function is normal. There is normal pulmonary artery systolic pressure. The tricuspid regurgitant velocity is 2.66 m/s, and with an assumed right atrial pressure of 3 mmHg,  the estimated right ventricular systolic pressure is 22.0 mmHg. Left Atrium: Left atrial size was normal in size. Right Atrium: Right atrial size was normal in size. Pericardium: There is no evidence of  pericardial effusion. Mitral Valve: The mitral valve is normal in structure. Trivial mitral valve regurgitation. No evidence of mitral valve stenosis. Tricuspid Valve: The tricuspid valve is normal in structure. Tricuspid valve regurgitation is trivial. No evidence of tricuspid stenosis. Aortic Valve: The aortic valve has an indeterminant number of cusps. Aortic valve regurgitation is not visualized. No aortic stenosis is present. Aortic valve mean gradient measures 3.0 mmHg. Aortic valve peak gradient measures 5.6 mmHg. Aortic valve area, by VTI measures 1.85 cm. Pulmonic Valve: The pulmonic valve was not well visualized. Pulmonic valve regurgitation is not visualized. No evidence of pulmonic stenosis. Aorta: The aortic root is normal in size and structure. Venous: The inferior vena cava is normal in size with greater than 50% respiratory variability, suggesting right atrial pressure of 3 mmHg. IAS/Shunts: No atrial level shunt detected by color flow Doppler.  LEFT VENTRICLE PLAX 2D LVIDd:         4.00 cm      Diastology LVIDs:         2.50 cm      LV e' medial:    7.83 cm/s LV PW:         1.00 cm  LV E/e' medial:  10.8 LV IVS:        0.80 cm      LV e' lateral:   9.57 cm/s LVOT diam:     1.60 cm      LV E/e' lateral: 8.9 LV SV:         47 LV SV Index:   29 LVOT Area:     2.01 cm  LV Volumes (MOD) LV vol d, MOD A2C: 54.1 ml LV vol d, MOD A4C: 112.0 ml LV vol s, MOD A2C: 26.5 ml LV vol s, MOD A4C: 39.1 ml LV SV MOD A2C:     27.6 ml LV SV MOD A4C:     112.0 ml LV SV MOD BP:      53.4 ml RIGHT VENTRICLE RV S prime:     12.90 cm/s TAPSE (M-mode): 1.3 cm LEFT ATRIUM           Index LA diam:      3.20 cm 1.95 cm/m LA Vol (A2C): 25.0 ml 15.26 ml/m LA Vol (A4C): 49.6 ml 30.28 ml/m  AORTIC VALVE                    PULMONIC VALVE AV Area (Vmax):    1.87 cm     PV Vmax:       0.98 m/s AV Area (Vmean):   1.80 cm     PV Peak grad:  3.8 mmHg AV Area (VTI):     1.85 cm AV Vmax:           118.00 cm/s AV Vmean:           85.500 cm/s AV VTI:            0.257 m AV Peak Grad:      5.6 mmHg AV Mean Grad:      3.0 mmHg LVOT Vmax:         110.00 cm/s LVOT Vmean:        76.400 cm/s LVOT VTI:          0.236 m LVOT/AV VTI ratio: 0.92  AORTA Ao Root diam: 2.70 cm MITRAL VALVE               TRICUSPID VALVE MV Area (PHT): 3.60 cm    TR Peak grad:   28.3 mmHg MV Decel Time: 211 msec    TR Vmax:        266.00 cm/s MR Peak grad: 39.2 mmHg MR Vmax:      313.00 cm/s  SHUNTS MV E velocity: 84.80 cm/s  Systemic VTI:  0.24 m MV A velocity: 73.70 cm/s  Systemic Diam: 1.60 cm MV E/A ratio:  1.15 Kirk Ruths MD Electronically signed by Kirk Ruths MD Signature Date/Time: 04/17/2022/3:31:04 PM    Final    Overnight EEG with video  Result Date: 04/16/2022 Lora Havens, MD     04/17/2022  9:27 AM Patient Name: SAYAKA HOEPPNER MRN: [id number] Epilepsy Attending: Lora Havens Referring Physician/Provider: Lorenza Chick, MD Duration: 04/16/2022 0428 to 1558 Patient history: 61yo F with ams. EEG to evaluate for seizure Level of alertness: Awake, asleep AEDs during EEG study: None Technical aspects: This EEG study was done with scalp electrodes positioned according to the 10-20 International system of electrode placement. Electrical activity was reviewed with band pass filter of 1-'70Hz'$ , sensitivity of 7 uV/mm, display speed of 10m/sec with a '60Hz'$  notched filter applied as appropriate. EEG data were recorded continuously and digitally stored.  Video monitoring was available and reviewed as appropriate. Description: The posterior dominant rhythm consists of 7 Hz activity of moderate voltage (25-35 uV) seen predominantly in posterior head regions, symmetric and reactive to eye opening and eye closing. Sleep was characterized by vertex waves, sleep spindles (12 to 14 Hz), maximal frontocentral region. EEG showed continuous/intermittent generalized predominantly 5-7 Hz theta slowing admixed with intermittent 2-'3hz'$  delta slowing. Hyperventilation  and photic stimulation were not performed.   ABNORMALITY - Continuous slow, generalized IMPRESSION: This study is suggestive of mild to moderate diffuse encephalopathy, nonspecific etiology. No seizures or epileptiform discharges were seen throughout the recording. Lora Havens   MR CERVICAL SPINE WO CONTRAST  Result Date: 04/16/2022 CLINICAL DATA:  Myelopathy, acute, cervical spine Right upper extremity weakness. EXAM: MRI CERVICAL SPINE WITHOUT CONTRAST TECHNIQUE: Multiplanar, multisequence MR imaging of the cervical spine was performed. No intravenous contrast was administered. COMPARISON:  04/23/2005 FINDINGS: Alignment: Physiologic. Vertebrae: No fracture, evidence of discitis, or bone lesion. Cord: Normal signal and morphology. Posterior Fossa, vertebral arteries, paraspinal tissues: Negative. Disc levels: C1-2: Unremarkable. C2-3: Normal disc space and facet joints. There is no spinal canal stenosis. No neural foraminal stenosis. C3-4: Small disc bulge with bilateral uncovertebral hypertrophy. There is no spinal canal stenosis. Unchanged severe right and moderate left neural foraminal stenosis. C4-5: Normal disc space and facet joints. There is no spinal canal stenosis. No neural foraminal stenosis. C5-6: Normal disc space and facet joints. There is no spinal canal stenosis. No neural foraminal stenosis. C6-7: Normal disc space and facet joints. There is no spinal canal stenosis. No neural foraminal stenosis. C7-T1: Normal disc space and facet joints. There is no spinal canal stenosis. No neural foraminal stenosis. IMPRESSION: 1. Unchanged severe right and moderate left C3-4 neural foraminal stenosis secondary to uncovertebral hypertrophy. 2. No spinal canal stenosis. Electronically Signed   By: Ulyses Jarred M.D.   On: 04/16/2022 02:14   MR BRAIN WO CONTRAST  Result Date: 04/16/2022 CLINICAL DATA:  New onset seizure EXAM: MRI HEAD WITHOUT CONTRAST TECHNIQUE: Multiplanar, multiecho pulse  sequences of the brain and surrounding structures were obtained without intravenous contrast. COMPARISON:  None Available. FINDINGS: Brain: No acute infarct, mass effect or extra-axial collection. No acute or chronic hemorrhage. There is multifocal hyperintense T2-weighted signal within the white matter. Generalized volume loss. The midline structures are normal. Vascular: Major flow voids are preserved. Skull and upper cervical spine: Normal calvarium and skull base. Visualized upper cervical spine and soft tissues are normal. Sinuses/Orbits:No paranasal sinus fluid levels or advanced mucosal thickening. No mastoid or middle ear effusion. Normal orbits. IMPRESSION: 1. No acute intracranial abnormality. 2. Findings of chronic small vessel ischemia and volume loss. Electronically Signed   By: Ulyses Jarred M.D.   On: 04/16/2022 02:09   CT CHEST ABDOMEN PELVIS WO CONTRAST  Result Date: 04/15/2022 CLINICAL DATA:  Seizure activity today, possible sepsis EXAM: CT CHEST, ABDOMEN AND PELVIS WITHOUT CONTRAST TECHNIQUE: Multidetector CT imaging of the chest, abdomen and pelvis was performed following the standard protocol without IV contrast. RADIATION DOSE REDUCTION: This exam was performed according to the departmental dose-optimization program which includes automated exposure control, adjustment of the mA and/or kV according to patient size and/or use of iterative reconstruction technique. COMPARISON:  Multiple priors including CT November 10, 2021 FINDINGS: CT CHEST FINDINGS Cardiovascular: Aortic atherosclerosis. Enlarged main pulmonary artery measures 3.2 cm. Cardiac enlargement. Coronary artery calcifications. Mediastinum/Nodes: Partially calcified 9 mm right thyroid nodule. Not clinically significant; no follow-up imaging recommended (ref: J  Am Coll Radiol. 2015 Feb;12(2): 143-50).No pathologically enlarged mediastinal, hilar or axillary lymph nodes, noting limited sensitivity for the detection of hilar adenopathy  on this noncontrast study. Patulous esophagus with retained/refluxed contents in the esophagus. Lungs/Pleura: Patchy bilateral ground-glass opacities with multifocal linear/banded consolidative opacities likely reflect a combination of atelectasis and an infectious or inflammatory process. No pleural effusion. No pneumothorax. Musculoskeletal: Prior median sternotomy. No acute osseous abnormality. CT ABDOMEN PELVIS FINDINGS Hepatobiliary: Unremarkable noncontrast enhanced appearance of the hepatic parenchyma. Gallbladder is distended. No biliary ductal dilation. Pancreas: No pancreatic ductal dilation or evidence of acute inflammation. Spleen: No splenomegaly. Adrenals/Urinary Tract: Similar thickening of the bilateral adrenal glands without discrete nodularity, favored benign hyperplasia and requiring no follow-up. Perinephric and Peri pelvic stranding. Nonobstructive 9 mm left upper pole renal stone. Hydronephrosis. Gas in the urinary bladder. Stomach/Bowel: Stomach is minimally distended limiting evaluation. No pathologic dilation of small or large bowel. Normal appendix. Moderate volume of formed stool throughout the colon. Mild rectal and sigmoid colon wall thickening for instance on 105/2. Vascular/Lymphatic: Aortic atherosclerosis. No pathologically enlarged abdominal or pelvic lymph nodes. Reproductive: Status post hysterectomy. No adnexal masses. Other: Mild nonspecific body wall edema. No significant abdominopelvic free fluid. No walled off fluid collections. Musculoskeletal: No acute osseous abnormality. IMPRESSION: 1. Patchy bilateral ground-glass opacities with multifocal linear/banded consolidative opacities likely reflect a combination of atelectasis and an infectious or inflammatory process. 2. Mild rectal and sigmoid colon wall thickening, which may reflect a nonspecific proctocolitis. 3. Mild perinephric and peripelvic stranding with a nonobstructive 9 mm left upper pole renal stone. Suggest  correlation with laboratory values for infection. 4. Gas in the urinary bladder, correlate for recent instrumentation. 5. Moderate volume of formed stool throughout the colon. Correlate for constipation. 6. Enlarged main pulmonary artery, which can be seen in the setting of pulmonary arterial hypertension. 7.  Aortic Atherosclerosis (ICD10-I70.0). Electronically Signed   By: Dahlia Bailiff M.D.   On: 04/15/2022 18:48   CT Head Wo Contrast  Result Date: 04/15/2022 CLINICAL DATA:  Seizure. EXAM: CT HEAD WITHOUT CONTRAST TECHNIQUE: Contiguous axial images were obtained from the base of the skull through the vertex without intravenous contrast. RADIATION DOSE REDUCTION: This exam was performed according to the departmental dose-optimization program which includes automated exposure control, adjustment of the mA and/or kV according to patient size and/or use of iterative reconstruction technique. COMPARISON:  November 10, 2021. FINDINGS: Brain: No evidence of acute infarction, hemorrhage, hydrocephalus, extra-axial collection or mass lesion/mass effect. Vascular: No hyperdense vessel or unexpected calcification. Skull: Normal. Negative for fracture or focal lesion. Sinuses/Orbits: No acute finding. Other: None. IMPRESSION: No acute intracranial abnormality seen. Electronically Signed   By: Marijo Conception M.D.   On: 04/15/2022 15:21   DG Chest Port 1 View  Result Date: 04/15/2022 CLINICAL DATA:  Seizure EXAM: PORTABLE CHEST 1 VIEW COMPARISON:  11/10/2021 FINDINGS: Single frontal view of the chest demonstrates a stable cardiac silhouette. Postsurgical changes from median sternotomy. No airspace disease, effusion, or pneumothorax. No acute bony abnormalities. IMPRESSION: 1. No acute intrathoracic process. Electronically Signed   By: Randa Ngo M.D.   On: 04/15/2022 15:05      Subjective: Daughter on phone.  Patient denies complaints.  First thing she asked was if she is going home today.  Voiding without  difficulty.  Ambulating without dizziness, lightheadedness, chest pain or dyspnea.  RN reported no acute events.  Discharge Exam:  Vitals:   04/20/22 0900 04/20/22 1000 04/20/22 1100 04/20/22 1200  BP:  Pulse:      Resp: '14 18 14 17  '$ Temp:      TempSrc:      SpO2:      Weight:      Height:        General exam: Middle-age female, looks older than stated age, sitting up in bed without distress. Respiratory system: Clear to auscultation.  No increased work of breathing. Cardiovascular system: S1 & S2 heard, RRR. No JVD, murmurs, rubs, gallops or clicks. No pedal edema.  Telemetry personally reviewed: Sinus rhythm. Gastrointestinal system: Abdomen is nondistended, soft and nontender. No organomegaly or masses felt. Normal bowel sounds heard. Central nervous system: Alert and oriented to person, place and partly to time, April 01, 2022. No focal neurological deficits. Extremities: Symmetric 5 x 5 power. Skin: No rashes, lesions or ulcers Psychiatry: Judgement and insight appear normal. Mood & affect pleasant and appropriate.  Did not appear anxious or tearful during my visit.     The results of significant diagnostics from this hospitalization (including imaging, microbiology, ancillary and laboratory) are listed below for reference.     Microbiology: Recent Results (from the past 240 hour(s))  Urine Culture     Status: Abnormal   Collection Time: 04/15/22  2:53 PM   Specimen: Urine, Catheterized  Result Value Ref Range Status   Specimen Description   Final    URINE, CATHETERIZED Performed at [facility], [address] [place], [place] [address]    Special Requests   Final    NONE Performed at [facility], [address] [place], [place] [address]    Culture >=100,000 COLONIES/mL ESCHERICHIA COLI (A)  Final   Report Status 04/17/2022 FINAL  Final   Organism ID, Bacteria ESCHERICHIA COLI (A)  Final      Susceptibility   Escherichia coli - MIC*    AMPICILLIN >=32  RESISTANT Resistant     CEFAZOLIN <=4 SENSITIVE Sensitive     CEFEPIME <=0.12 SENSITIVE Sensitive     CEFTRIAXONE <=0.25 SENSITIVE Sensitive     CIPROFLOXACIN >=4 RESISTANT Resistant     GENTAMICIN <=1 SENSITIVE Sensitive     IMIPENEM <=0.25 SENSITIVE Sensitive     NITROFURANTOIN <=16 SENSITIVE Sensitive     TRIMETH/SULFA 80 RESISTANT Resistant     AMPICILLIN/SULBACTAM >=32 RESISTANT Resistant     PIP/TAZO <=4 SENSITIVE Sensitive     * >=100,000 COLONIES/mL ESCHERICHIA COLI  Resp Panel by RT-PCR (Flu A&B, Covid) Anterior Nasal Swab     Status: None   Collection Time: 04/15/22  2:53 PM   Specimen: Anterior Nasal Swab  Result Value Ref Range Status   SARS Coronavirus 2 by RT PCR NEGATIVE NEGATIVE Final    Comment: (NOTE) SARS-CoV-2 target nucleic acids are NOT DETECTED.  The SARS-CoV-2 RNA is generally detectable in upper respiratory specimens during the acute phase of infection. The lowest concentration of SARS-CoV-2 viral copies this assay can detect is 138 copies/mL. A negative result does not preclude SARS-Cov-2 infection and should not be used as the sole basis for treatment or other patient management decisions. A negative result may occur with  improper specimen collection/handling, submission of specimen other than nasopharyngeal swab, presence of viral mutation(s) within the areas targeted by this assay, and inadequate number of viral copies(<138 copies/mL). A negative result must be combined with clinical observations, patient history, and epidemiological information. The expected result is Negative.  Fact Sheet for Patients:  EntrepreneurPulse.com.au  Fact Sheet for Healthcare Providers:  IncredibleEmployment.be  This test is no  t yet approved or cleared by the [place] and  has been authorized for detection and/or diagnosis of SARS-CoV-2 by FDA under an Emergency Use Authorization (EUA). This EUA will remain  in effect  (meaning this test can be used) for the duration of the COVID-19 declaration under Section 564(b)(1) of the Act, 21 U.S.C.section 360bbb-3(b)(1), unless the authorization is terminated  or revoked sooner.       Influenza A by PCR NEGATIVE NEGATIVE Final   Influenza B by PCR NEGATIVE NEGATIVE Final    Comment: (NOTE) The Xpert Xpress SARS-CoV-2/FLU/RSV plus assay is intended as an aid in the diagnosis of influenza from Nasopharyngeal swab specimens and should not be used as a sole basis for treatment. Nasal washings and aspirates are unacceptable for Xpert Xpress SARS-CoV-2/FLU/RSV testing.  Fact Sheet for Patients: EntrepreneurPulse.com.au  Fact Sheet for Healthcare Providers: IncredibleEmployment.be  This test is not yet approved or cleared by the [place] FDA and has been authorized for detection and/or diagnosis of SARS-CoV-2 by FDA under an Emergency Use Authorization (EUA). This EUA will remain in effect (meaning this test can be used) for the duration of the COVID-19 declaration under Section 564(b)(1) of the Act, 21 U.S.C. section 360bbb-3(b)(1), unless the authorization is terminated or revoked.  Performed at [facility], [address] [place], [place] [address]   Culture, blood (routine x 2)     Status: Abnormal   Collection Time: 04/15/22  3:01 PM   Specimen: BLOOD RIGHT FOREARM  Result Value Ref Range Status   Specimen Description   Final    BLOOD RIGHT FOREARM Performed at [facility], [address], [place], [place] [address]    Special Requests   Final    BOTTLES DRAWN AEROBIC AND ANAEROBIC Blood Culture adequate volume Performed at [facility], [address] [place], [place] [address]    Culture  Setup Time   Final    GRAM NEGATIVE RODS BOTTLES DRAWN AEROBIC AND ANAEROBIC Gram Stain Report Called to,Read Back By and Verified With: SCHUMUTZ,K'@0440'$  BY MATTHEWS B 11.17.2023   Culture ESCHERICHIA COLI (A)  Final    Report Status 04/18/2022 FINAL  Final   Organism ID, Bacteria ESCHERICHIA COLI  Final      Susceptibility   Escherichia coli - MIC*    AMPICILLIN >=32 RESISTANT Resistant     CEFAZOLIN <=4 SENSITIVE Sensitive     CEFEPIME <=0.12 SENSITIVE Sensitive     CEFTAZIDIME <=1 SENSITIVE Sensitive     CEFTRIAXONE <=0.25 SENSITIVE Sensitive     CIPROFLOXACIN >=4 RESISTANT Resistant     GENTAMICIN <=1 SENSITIVE Sensitive     IMIPENEM <=0.25 SENSITIVE Sensitive     TRIMETH/SULFA 80 RESISTANT Resistant     AMPICILLIN/SULBACTAM >=32 RESISTANT Resistant     PIP/TAZO <=4 SENSITIVE Sensitive     * ESCHERICHIA COLI  Blood Culture ID Panel (Reflexed)     Status: Abnormal   Collection Time: 04/15/22  3:01 PM  Result Value Ref Range Status   Enterococcus faecalis NOT DETECTED NOT DETECTED Final   Enterococcus Faecium NOT DETECTED NOT DETECTED Final   Listeria monocytogenes NOT DETECTED NOT DETECTED Final   Staphylococcus species NOT DETECTED NOT DETECTED Final   Staphylococcus aureus (BCID) NOT DETECTED NOT DETECTED Final   Staphylococcus epidermidis NOT DETECTED NOT DETECTED Final   Staphylococcus lugdunensis NOT DETECTED NOT DETECTED Final   Streptococcus species NOT DETECTED NOT DETECTED Final   Streptococcus agalactiae NOT DETECTED NOT DETECTED Final   Streptococcus pneumoniae NOT DETECTED  NOT DETECTED Final   Streptococcus pyogenes NOT DETECTED NOT DETECTED Final   A.calcoaceticus-baumannii NOT DETECTED NOT DETECTED Final   Bacteroides fragilis NOT DETECTED NOT DETECTED Final   Enterobacterales DETECTED (A) NOT DETECTED Final    Comment: Enterobacterales represent a large order of gram negative bacteria, not a single organism. CRITICAL RESULT CALLED TO, READ BACK BY AND VERIFIED WITH: J. FRENS PHARMD, AT 4166 04/16/22 BY D. VANHOOK    Enterobacter cloacae complex NOT DETECTED NOT DETECTED Final   Escherichia coli DETECTED (A) NOT DETECTED Final    Comment: CRITICAL RESULT CALLED TO, READ BACK  BY AND VERIFIED WITH: J. FRENS PHARMD, AT 0630 04/16/22 BY D. VANHOOK    Klebsiella aerogenes NOT DETECTED NOT DETECTED Final   Klebsiella oxytoca NOT DETECTED NOT DETECTED Final   Klebsiella pneumoniae NOT DETECTED NOT DETECTED Final   Proteus species NOT DETECTED NOT DETECTED Final   Salmonella species NOT DETECTED NOT DETECTED Final   Serratia marcescens NOT DETECTED NOT DETECTED Final   Haemophilus influenzae NOT DETECTED NOT DETECTED Final   Neisseria meningitidis NOT DETECTED NOT DETECTED Final   Pseudomonas aeruginosa NOT DETECTED NOT DETECTED Final   Stenotrophomonas maltophilia NOT DETECTED NOT DETECTED Final   Candida albicans NOT DETECTED NOT DETECTED Final   Candida auris NOT DETECTED NOT DETECTED Final   Candida glabrata NOT DETECTED NOT DETECTED Final   Candida krusei NOT DETECTED NOT DETECTED Final   Candida parapsilosis NOT DETECTED NOT DETECTED Final   Candida tropicalis NOT DETECTED NOT DETECTED Final   Cryptococcus neoformans/gattii NOT DETECTED NOT DETECTED Final   CTX-M ESBL NOT DETECTED NOT DETECTED Final   Carbapenem resistance IMP NOT DETECTED NOT DETECTED Final   Carbapenem resistance KPC NOT DETECTED NOT DETECTED Final   Carbapenem resistance NDM NOT DETECTED NOT DETECTED Final   Carbapenem resist OXA 48 LIKE NOT DETECTED NOT DETECTED Final   Carbapenem resistance VIM NOT DETECTED NOT DETECTED Final    Comment: Performed at [facility], [place] [address] [place], [place] [address]  Culture, blood (routine x 2)     Status: Abnormal   Collection Time: 04/15/22  3:37 PM   Specimen: BLOOD  Result Value Ref Range Status   Specimen Description   Final    BLOOD BLOOD RIGHT ARM Performed at [facility], [address] [place], [place] [address]    Special Requests   Final    BOTTLES DRAWN AEROBIC AND ANAEROBIC Blood Culture adequate volume Performed at [facility], [address] [place], [place] [address]    Culture  Setup Time   Final    GRAM  NEGATIVE RODS BOTTLES DRAWN AEROBIC AND ANAEROBIC Gram Stain Report Called to,Read Back By and Verified With: SCHMUTZ,K'@MOSES'$  CONE '@0440'$  BY MATTHEWS, B 11.17.2023 CRITICAL RESULT CALLED TO, READ BACK BY AND VERIFIED WITH: J. FRENS PHARMD, AT 1131 04/16/22 BY D. VANHOOK    Culture (A)  Final    ESCHERICHIA COLI SUSCEPTIBILITIES PERFORMED ON PREVIOUS CULTURE WITHIN THE LAST 5 DAYS. Performed at [facility], [place] [address], [place], [place] [address]    Report Status 04/18/2022 FINAL  Final  CSF culture     Status: None   Collection Time: 04/15/22  3:57 PM   Specimen: Back; Cerebrospinal Fluid  Result Value Ref Range Status   Specimen Description   Final    BACK Performed at [facility], [address], [place],  [address]    Special Requests   Final    NONE  Performed at [facility], [address] [place], [place] [address]    Gram Stain   Final    WBC PRESENT, PREDOMINANTLY MONONUCLEAR NO ORGANISMS SEEN    Culture   Final    NO GROWTH 3 DAYS Performed at [facility] [address], [place], [place] [address]    Report Status 04/19/2022 FINAL  Final  Gram stain     Status: None   Collection Time: 04/15/22  3:57 PM   Specimen: CSF; Cerebrospinal Fluid  Result Value Ref Range Status   Specimen Description CSF  Final   Special Requests NONE  Final   Gram Stain   Final    NO ORGANISMS SEEN WBC PRESENT, PREDOMINANTLY MONONUCLEAR Performed at [facility], [address], [place], [place] [address]    Report Status 04/15/2022 FINAL  Final  MRSA Next Gen by PCR, Nasal     Status: None   Collection Time: 04/15/22 10:18 PM   Specimen: Nasal Mucosa; Nasal Swab  Result Value Ref Range Status   MRSA by PCR Next Gen NOT DETECTED NOT DETECTED Final    Comment: (NOTE) The GeneXpert MRSA Assay (FDA approved for NASAL specimens only), is one component of a comprehensive MRSA colonization surveillance program. It is not intended to diagnose MRSA infection nor to  guide or monitor treatment for MRSA infections. Test performance is not FDA approved in patients less than 108 years old. Performed at [facility], [place] [address], [place], [place] [address]   Respiratory (~20 pathogens) panel by PCR     Status: None   Collection Time: 04/16/22  2:18 AM   Specimen: Nasopharyngeal Swab; Respiratory  Result Value Ref Range Status   Adenovirus NOT DETECTED NOT DETECTED Final   Coronavirus 229E NOT DETECTED NOT DETECTED Final    Comment: (NOTE) The Coronavirus on the Respiratory Panel, DOES NOT test for the novel  Coronavirus (2019 nCoV)    Coronavirus HKU1 NOT DETECTED NOT DETECTED Final   Coronavirus NL63 NOT DETECTED NOT DETECTED Final   Coronavirus OC43 NOT DETECTED NOT DETECTED Final   Metapneumovirus NOT DETECTED NOT DETECTED Final   Rhinovirus / Enterovirus NOT DETECTED NOT DETECTED Final   Influenza A NOT DETECTED NOT DETECTED Final   Influenza B NOT DETECTED NOT DETECTED Final   Parainfluenza Virus 1 NOT DETECTED NOT DETECTED Final   Parainfluenza Virus 2 NOT DETECTED NOT DETECTED Final   Parainfluenza Virus 3 NOT DETECTED NOT DETECTED Final   Parainfluenza Virus 4 NOT DETECTED NOT DETECTED Final   Respiratory Syncytial Virus NOT DETECTED NOT DETECTED Final   Bordetella pertussis NOT DETECTED NOT DETECTED Final   Bordetella Parapertussis NOT DETECTED NOT DETECTED Final   Chlamydophila pneumoniae NOT DETECTED NOT DETECTED Final   Mycoplasma pneumoniae NOT DETECTED NOT DETECTED Final    Comment: Performed at [facility], [place]. [address], [place],  [address]     Labs: CBC: Recent Labs  Lab 04/15/22 1453 04/16/22 0046 04/16/22 0320 04/17/22 0721 04/18/22 0226 04/19/22 0237  WBC 6.2 22.1* 20.4* 10.5 7.7 7.3  NEUTROABS 5.9  --   --  9.2* 6.2  --   HGB 13.2 9.6* 8.7* 9.9* 11.0* 10.9*  HCT 41.5 29.6* 26.6* 31.5* 33.9* 34.7*  MCV 79.5* 79.8* 81.1 80.8 79.0* 81.5  PLT 84* 72* 70* 63* 69* 99*    Basic Metabolic  Panel: Recent Labs  Lab 04/15/22 1453 04/15/22 1948 04/16/22 0046 04/16/22 0320 04/17/22 0721 04/18/22 0226 04/19/22 0237 04/20/22  0407  NA 133*  --    < > 138 142 140 141 137  K 4.0  --    < > 3.9 3.7 3.3* 4.3 3.9  CL 98  --    < > 103 108 103 102 103  CO2 20*  --    < > 20* '23 25 25 25  '$ GLUCOSE 207*  --    < > 223* 184* 149* 194* 152*  BUN 96*  --    < > 77* 54* 38* 38* 30*  CREATININE 4.08*  --    < > 3.30* 2.14* 1.69* 1.77* 1.38*  CALCIUM 8.8*  --    < > 8.0* 8.8* 9.1 8.8* 8.4*  MG 2.4  --   --  2.1  --  1.7 1.6* 2.1  PHOS  --  2.8  --  4.8*  --   --  2.4* 3.8   < > = values in this interval not displayed.    Liver Function Tests: Recent Labs  Lab 04/16/22 0046 04/17/22 0721 04/18/22 0226 04/19/22 0237 04/20/22 0407  AST 122* 117* 74* 48* 38  ALT 46* 60* 54* 47* 43  ALKPHOS 56 109 118 139* 108  BILITOT 1.0 0.4 1.1 0.6 0.6  PROT 5.3* 5.7* 6.1* 5.8* 5.7*  ALBUMIN 2.2* 2.4* 2.6* 2.5* 2.5*    CBG: Recent Labs  Lab 04/19/22 1629 04/19/22 2117 04/20/22 0720 04/20/22 1140 04/20/22 1157  GLUCAP 216* 274* 192* 238* 229*    Hgb A1c Recent Labs    04/18/22 0226  HGBA1C 7.5*   Urinalysis    Component Value Date/Time   COLORURINE YELLOW 04/15/2022 1453   APPEARANCEUR CLOUDY (A) 04/15/2022 1453   LABSPEC 1.015 04/15/2022 1453   PHURINE 6.0 04/15/2022 1453   GLUCOSEU NEGATIVE 04/15/2022 1453   HGBUR LARGE (A) 04/15/2022 1453   BILIRUBINUR NEGATIVE 04/15/2022 1453   KETONESUR NEGATIVE 04/15/2022 1453   PROTEINUR 100 (A) 04/15/2022 1453   UROBILINOGEN 0.2 02/04/2015 1915   NITRITE NEGATIVE 04/15/2022 1453   LEUKOCYTESUR MODERATE (A) 04/15/2022 1453    Discussed in detail with patient's daughter via phone, updated care and answered all questions.  Time coordinating discharge: 45 minutes  SIGNED:  Vernell Leep, MD,  FACP, [place], [facility], [facility], [facility]   [facility] Ad[facility] [place]     To contact the attending provider  between 7A-7P or the covering provider during after hours 7P-7A, please log into the web site [email or web address] and access using universal Justin password for that web site. If you do not have the password, please call the hospital operator.

## 2022-04-20 NOTE — Progress Notes (Signed)
Pt. Daughter Nira Conn called and got her husband's number.  Called Elberta Fortis Burtt "Heather's Husband" and confirmed he indeed came up here and left with the patient without telling the nurse/front desk.  All patient teaching had previously been done, AVS given, PIVs had been removed.

## 2022-04-20 NOTE — TOC Transition Note (Signed)
Transition of Care [facility]) - CM/SW Discharge Note   Patient Details  Name: Anita [name] MRN: [id number] Date of Birth: September 11, 1960  Transition of Care [facility]) CM/SW Contact:  Tom-Johnson, Renea Ee, RN Phone Number: 04/20/2022, 2:30 PM   Clinical Narrative:     Patient is scheduled for discharge today. Home health info on AVS. BSC ordered from [employer] and [facility] to deliver at bedside. Family to transport at discharge. No further TOC needs noted.     Final next level of care: [place] Barriers to Discharge: Barriers Resolved   Patient Goals and CMS Choice Patient states their goals for this hospitalization and ongoing recovery are:: To return home CMS Medicare.gov Compare Post Acute Care list provided to:: Patient Choice offered to / list presented to : Patient  Discharge Placement                Patient to be transferred to facility by: Family      Discharge Plan and Services   Discharge Planning Services: CM Consult            DME Arranged: Bedside commode DME Agency: [employer] Date DME Agency Contacted: 04/20/22 Time DME Agency Contacted: [phone] Representative spoke with at DME Agency: Jodell [facility] HH Arranged: PT, OT [place] Agency: NA Date [place]: 04/18/22 Time [place]: [place] Representative spoke with at [place]: [place] Determinants of Health ([place]) Interventions     Readmission Risk Interventions     No data to display

## 2022-04-20 NOTE — Progress Notes (Signed)
Discharge meds delivered and given to patient

## 2022-04-21 ENCOUNTER — Encounter: Payer: Self-pay | Admitting: *Deleted

## 2022-04-21 ENCOUNTER — Other Ambulatory Visit: Payer: Self-pay | Admitting: Physician Assistant

## 2022-04-21 ENCOUNTER — Other Ambulatory Visit: Payer: Medicare Other

## 2022-04-21 DIAGNOSIS — I4891 Unspecified atrial fibrillation: Secondary | ICD-10-CM

## 2022-04-21 DIAGNOSIS — R002 Palpitations: Secondary | ICD-10-CM

## 2022-04-21 LAB — METHYLMALONIC ACID, SERUM: Methylmalonic Acid, Quantitative: 1030 nmol/L — ABNORMAL HIGH (ref 0–378)

## 2022-04-21 NOTE — Progress Notes (Unsigned)
Enrolled for [employer] to mail a ZIO AT Live Telemetry monitor to patients address on file.   Letter mailed to patient with instructions.  Dr. Tamala Julian to read and follow up.

## 2022-04-23 LAB — MISC LABCORP TEST (SEND OUT): Labcorp test code: 716811

## 2022-04-26 ENCOUNTER — Other Ambulatory Visit (HOSPITAL_COMMUNITY): Payer: Self-pay

## 2022-04-30 DIAGNOSIS — Z6821 Body mass index (BMI) 21.0-21.9, adult: Secondary | ICD-10-CM | POA: Diagnosis not present

## 2022-04-30 DIAGNOSIS — A419 Sepsis, unspecified organism: Secondary | ICD-10-CM | POA: Diagnosis not present

## 2022-04-30 DIAGNOSIS — I10 Essential (primary) hypertension: Secondary | ICD-10-CM | POA: Diagnosis not present

## 2022-04-30 DIAGNOSIS — N10 Acute tubulo-interstitial nephritis: Secondary | ICD-10-CM | POA: Diagnosis not present

## 2022-04-30 DIAGNOSIS — G894 Chronic pain syndrome: Secondary | ICD-10-CM | POA: Diagnosis not present

## 2022-04-30 DIAGNOSIS — E1140 Type 2 diabetes mellitus with diabetic neuropathy, unspecified: Secondary | ICD-10-CM | POA: Diagnosis not present

## 2022-05-03 ENCOUNTER — Encounter (HOSPITAL_COMMUNITY): Payer: Self-pay

## 2022-05-03 ENCOUNTER — Emergency Department (HOSPITAL_COMMUNITY): Payer: Medicare Other

## 2022-05-03 ENCOUNTER — Inpatient Hospital Stay (HOSPITAL_COMMUNITY)
Admission: EM | Admit: 2022-05-03 | Discharge: 2022-05-07 | DRG: 689 | Disposition: A | Discharge status: Home Health | Payer: Medicare Other | Attending: Internal Medicine | Admitting: Internal Medicine

## 2022-05-03 DIAGNOSIS — R410 Disorientation, unspecified: Secondary | ICD-10-CM | POA: Diagnosis not present

## 2022-05-03 DIAGNOSIS — I2510 Atherosclerotic heart disease of native coronary artery without angina pectoris: Secondary | ICD-10-CM | POA: Diagnosis not present

## 2022-05-03 DIAGNOSIS — Z8673 Personal history of transient ischemic attack (TIA), and cerebral infarction without residual deficits: Secondary | ICD-10-CM

## 2022-05-03 DIAGNOSIS — E8809 Other disorders of plasma-protein metabolism, not elsewhere classified: Secondary | ICD-10-CM | POA: Diagnosis not present

## 2022-05-03 DIAGNOSIS — Z91199 Patient's noncompliance with other medical treatment and regimen due to unspecified reason: Secondary | ICD-10-CM

## 2022-05-03 DIAGNOSIS — G9341 Metabolic encephalopathy: Secondary | ICD-10-CM | POA: Diagnosis present

## 2022-05-03 DIAGNOSIS — E119 Type 2 diabetes mellitus without complications: Secondary | ICD-10-CM | POA: Diagnosis not present

## 2022-05-03 DIAGNOSIS — Z87891 Personal history of nicotine dependence: Secondary | ICD-10-CM | POA: Diagnosis not present

## 2022-05-03 DIAGNOSIS — E039 Hypothyroidism, unspecified: Secondary | ICD-10-CM | POA: Diagnosis not present

## 2022-05-03 DIAGNOSIS — T402X5A Adverse effect of other opioids, initial encounter: Secondary | ICD-10-CM | POA: Diagnosis present

## 2022-05-03 DIAGNOSIS — R531 Weakness: Secondary | ICD-10-CM | POA: Diagnosis not present

## 2022-05-03 DIAGNOSIS — R519 Headache, unspecified: Secondary | ICD-10-CM | POA: Diagnosis not present

## 2022-05-03 DIAGNOSIS — D509 Iron deficiency anemia, unspecified: Secondary | ICD-10-CM | POA: Diagnosis present

## 2022-05-03 DIAGNOSIS — Z880 Allergy status to penicillin: Secondary | ICD-10-CM

## 2022-05-03 DIAGNOSIS — N390 Urinary tract infection, site not specified: Secondary | ICD-10-CM | POA: Diagnosis present

## 2022-05-03 DIAGNOSIS — E1140 Type 2 diabetes mellitus with diabetic neuropathy, unspecified: Secondary | ICD-10-CM | POA: Diagnosis present

## 2022-05-03 DIAGNOSIS — E46 Unspecified protein-calorie malnutrition: Secondary | ICD-10-CM | POA: Diagnosis not present

## 2022-05-03 DIAGNOSIS — Z1152 Encounter for screening for COVID-19: Secondary | ICD-10-CM | POA: Diagnosis not present

## 2022-05-03 DIAGNOSIS — Z6821 Body mass index (BMI) 21.0-21.9, adult: Secondary | ICD-10-CM

## 2022-05-03 DIAGNOSIS — F419 Anxiety disorder, unspecified: Secondary | ICD-10-CM | POA: Diagnosis present

## 2022-05-03 DIAGNOSIS — Z79899 Other long term (current) drug therapy: Secondary | ICD-10-CM | POA: Diagnosis not present

## 2022-05-03 DIAGNOSIS — E782 Mixed hyperlipidemia: Secondary | ICD-10-CM | POA: Diagnosis present

## 2022-05-03 DIAGNOSIS — Z888 Allergy status to other drugs, medicaments and biological substances status: Secondary | ICD-10-CM

## 2022-05-03 DIAGNOSIS — N3000 Acute cystitis without hematuria: Secondary | ICD-10-CM | POA: Diagnosis not present

## 2022-05-03 DIAGNOSIS — L89159 Pressure ulcer of sacral region, unspecified stage: Secondary | ICD-10-CM | POA: Insufficient documentation

## 2022-05-03 DIAGNOSIS — G894 Chronic pain syndrome: Secondary | ICD-10-CM | POA: Diagnosis present

## 2022-05-03 DIAGNOSIS — L89153 Pressure ulcer of sacral region, stage 3: Secondary | ICD-10-CM | POA: Diagnosis not present

## 2022-05-03 DIAGNOSIS — E1122 Type 2 diabetes mellitus with diabetic chronic kidney disease: Secondary | ICD-10-CM | POA: Diagnosis not present

## 2022-05-03 DIAGNOSIS — E1165 Type 2 diabetes mellitus with hyperglycemia: Secondary | ICD-10-CM | POA: Diagnosis present

## 2022-05-03 DIAGNOSIS — R627 Adult failure to thrive: Secondary | ICD-10-CM | POA: Diagnosis not present

## 2022-05-03 DIAGNOSIS — N179 Acute kidney failure, unspecified: Secondary | ICD-10-CM | POA: Diagnosis present

## 2022-05-03 DIAGNOSIS — I700 Atherosclerosis of aorta: Secondary | ICD-10-CM | POA: Diagnosis present

## 2022-05-03 DIAGNOSIS — R918 Other nonspecific abnormal finding of lung field: Secondary | ICD-10-CM | POA: Diagnosis not present

## 2022-05-03 DIAGNOSIS — Z823 Family history of stroke: Secondary | ICD-10-CM

## 2022-05-03 DIAGNOSIS — Z8249 Family history of ischemic heart disease and other diseases of the circulatory system: Secondary | ICD-10-CM

## 2022-05-03 DIAGNOSIS — Z79890 Hormone replacement therapy (postmenopausal): Secondary | ICD-10-CM

## 2022-05-03 DIAGNOSIS — R509 Fever, unspecified: Secondary | ICD-10-CM | POA: Diagnosis not present

## 2022-05-03 DIAGNOSIS — I129 Hypertensive chronic kidney disease with stage 1 through stage 4 chronic kidney disease, or unspecified chronic kidney disease: Secondary | ICD-10-CM | POA: Diagnosis present

## 2022-05-03 DIAGNOSIS — N1832 Chronic kidney disease, stage 3b: Secondary | ICD-10-CM | POA: Diagnosis present

## 2022-05-03 DIAGNOSIS — I10 Essential (primary) hypertension: Secondary | ICD-10-CM | POA: Diagnosis present

## 2022-05-03 DIAGNOSIS — R059 Cough, unspecified: Secondary | ICD-10-CM | POA: Diagnosis not present

## 2022-05-03 DIAGNOSIS — R413 Other amnesia: Secondary | ICD-10-CM | POA: Diagnosis not present

## 2022-05-03 DIAGNOSIS — N3001 Acute cystitis with hematuria: Secondary | ICD-10-CM | POA: Diagnosis not present

## 2022-05-03 DIAGNOSIS — I499 Cardiac arrhythmia, unspecified: Secondary | ICD-10-CM | POA: Diagnosis not present

## 2022-05-03 DIAGNOSIS — Z833 Family history of diabetes mellitus: Secondary | ICD-10-CM

## 2022-05-03 DIAGNOSIS — Z7984 Long term (current) use of oral hypoglycemic drugs: Secondary | ICD-10-CM

## 2022-05-03 DIAGNOSIS — Z743 Need for continuous supervision: Secondary | ICD-10-CM | POA: Diagnosis not present

## 2022-05-03 DIAGNOSIS — E872 Acidosis, unspecified: Secondary | ICD-10-CM | POA: Diagnosis present

## 2022-05-03 DIAGNOSIS — Z7982 Long term (current) use of aspirin: Secondary | ICD-10-CM

## 2022-05-03 DIAGNOSIS — E441 Mild protein-calorie malnutrition: Secondary | ICD-10-CM | POA: Diagnosis not present

## 2022-05-03 DIAGNOSIS — N136 Pyonephrosis: Secondary | ICD-10-CM | POA: Diagnosis not present

## 2022-05-03 DIAGNOSIS — N1330 Unspecified hydronephrosis: Secondary | ICD-10-CM | POA: Diagnosis not present

## 2022-05-03 DIAGNOSIS — N12 Tubulo-interstitial nephritis, not specified as acute or chronic: Secondary | ICD-10-CM | POA: Diagnosis not present

## 2022-05-03 DIAGNOSIS — N200 Calculus of kidney: Secondary | ICD-10-CM | POA: Diagnosis not present

## 2022-05-03 LAB — CBC WITH DIFFERENTIAL/PLATELET
Abs Immature Granulocytes: 0.04 10*3/uL (ref 0.00–0.07)
Basophils Absolute: 0 10*3/uL (ref 0.0–0.1)
Basophils Relative: 0 %
Eosinophils Absolute: 0 10*3/uL (ref 0.0–0.5)
Eosinophils Relative: 0 %
HCT: 32.4 % — ABNORMAL LOW (ref 36.0–46.0)
Hemoglobin: 9.7 g/dL — ABNORMAL LOW (ref 12.0–15.0)
Immature Granulocytes: 0 %
Lymphocytes Relative: 8 %
Lymphs Abs: 0.7 10*3/uL (ref 0.7–4.0)
MCH: 25.5 pg — ABNORMAL LOW (ref 26.0–34.0)
MCHC: 29.9 g/dL — ABNORMAL LOW (ref 30.0–36.0)
MCV: 85 fL (ref 80.0–100.0)
Monocytes Absolute: 0.5 10*3/uL (ref 0.1–1.0)
Monocytes Relative: 6 %
Neutro Abs: 8.3 10*3/uL — ABNORMAL HIGH (ref 1.7–7.7)
Neutrophils Relative %: 86 %
Platelets: 322 10*3/uL (ref 150–400)
RBC: 3.81 MIL/uL — ABNORMAL LOW (ref 3.87–5.11)
RDW: 16.6 % — ABNORMAL HIGH (ref 11.5–15.5)
WBC: 9.7 10*3/uL (ref 4.0–10.5)
nRBC: 0 % (ref 0.0–0.2)

## 2022-05-03 LAB — COMPREHENSIVE METABOLIC PANEL
ALT: 13 U/L (ref 0–44)
AST: 19 U/L (ref 15–41)
Albumin: 3.4 g/dL — ABNORMAL LOW (ref 3.5–5.0)
Alkaline Phosphatase: 90 U/L (ref 38–126)
Anion gap: 11 (ref 5–15)
BUN: 21 mg/dL (ref 8–23)
CO2: 25 mmol/L (ref 22–32)
Calcium: 9.2 mg/dL (ref 8.9–10.3)
Chloride: 102 mmol/L (ref 98–111)
Creatinine, Ser: 1.47 mg/dL — ABNORMAL HIGH (ref 0.44–1.00)
GFR, Estimated: 40 mL/min — ABNORMAL LOW (ref >60)
Glucose, Bld: 263 mg/dL — ABNORMAL HIGH (ref 70–99)
Potassium: 4.7 mmol/L (ref 3.5–5.1)
Sodium: 138 mmol/L (ref 135–145)
Total Bilirubin: 0.6 mg/dL (ref 0.3–1.2)
Total Protein: 7.5 g/dL (ref 6.5–8.1)

## 2022-05-03 LAB — RESP PANEL BY RT-PCR (FLU A&B, COVID) ARPGX2
Influenza A by PCR: NEGATIVE
Influenza B by PCR: NEGATIVE
SARS Coronavirus 2 by RT PCR: NEGATIVE

## 2022-05-03 LAB — URINALYSIS, ROUTINE W REFLEX MICROSCOPIC
Bilirubin Urine: NEGATIVE
Glucose, UA: >=500 mg/dL — AB
Ketones, ur: NEGATIVE mg/dL
Nitrite: NEGATIVE
Protein, ur: 30 mg/dL — AB
Specific Gravity, Urine: 1.007 (ref 1.005–1.030)
Trans Epithel, UA: <1
pH: 7 (ref 5.0–8.0)

## 2022-05-03 LAB — CK: Total CK: 33 U/L — ABNORMAL LOW (ref 38–234)

## 2022-05-03 LAB — PROTIME-INR
INR: 1 (ref 0.8–1.2)
Prothrombin Time: 13 seconds (ref 11.4–15.2)

## 2022-05-03 LAB — RAPID URINE DRUG SCREEN, HOSP PERFORMED
Amphetamines: NOT DETECTED
Barbiturates: NOT DETECTED
Benzodiazepines: POSITIVE — AB
Cocaine: NOT DETECTED
Opiates: NOT DETECTED
Tetrahydrocannabinol: NOT DETECTED

## 2022-05-03 LAB — LACTIC ACID, PLASMA
Lactic Acid, Venous: 2 mmol/L (ref 0.5–1.9)
Lactic Acid, Venous: 1.4 mmol/L (ref 0.5–1.9)

## 2022-05-03 LAB — APTT: aPTT: 30 seconds (ref 24–36)

## 2022-05-03 LAB — AMMONIA: Ammonia: 10 umol/L (ref 9–35)

## 2022-05-03 MED ORDER — LACTATED RINGERS IV BOLUS
30.0000 mL/kg | Freq: Once | INTRAVENOUS | Status: AC
  Administered 2022-05-03: 1743 mL via INTRAVENOUS

## 2022-05-03 MED ORDER — IOHEXOL 300 MG/ML  SOLN
100.0000 mL | Freq: Once | INTRAMUSCULAR | Status: AC | PRN
  Administered 2022-05-03: 100 mL via INTRAVENOUS

## 2022-05-03 MED ORDER — SODIUM CHLORIDE 0.9 % IV SOLN
500.0000 mg | Freq: Once | INTRAVENOUS | Status: AC
  Administered 2022-05-04: 500 mg via INTRAVENOUS
  Filled 2022-05-03: qty 5

## 2022-05-03 MED ORDER — ACETAMINOPHEN 325 MG PO TABS
650.0000 mg | ORAL_TABLET | Freq: Once | ORAL | Status: AC | PRN
  Administered 2022-05-03: 650 mg via ORAL
  Filled 2022-05-03: qty 2

## 2022-05-03 MED ORDER — SODIUM CHLORIDE 0.9 % IV SOLN
1.0000 g | Freq: Once | INTRAVENOUS | Status: AC
  Administered 2022-05-03: 1 g via INTRAVENOUS
  Filled 2022-05-03: qty 10

## 2022-05-03 NOTE — ED Notes (Signed)
Patient transported to CT 

## 2022-05-03 NOTE — ED Provider Notes (Signed)
  Physical Exam  BP (!) 169/69 (BP Location: Left Arm)   Pulse (!) 102   Temp 99.7 F (37.6 C) (Oral)   Resp 16   Ht '5\' 5"'$  (1.651 m)   Wt 58.1 kg   SpO2 96%   BMI 21.30 kg/m   Physical Exam Vitals and nursing note reviewed.     Procedures  Procedures  ED Course / MDM   Clinical Course as of 05/03/22 2145  [facility] May 03, 2022  2137 Generalized weakness. Confused. Fever. Diabetes R lower extremity redness.  Mental No one came to do PT [CR]    Clinical Course User Index [CR] Wilnette Kales, PA   Medical Decision Making Amount and/or Complexity of Data Reviewed Labs: ordered. Radiology: ordered. ECG/medicine tests: ordered.  Risk OTC drugs. Prescription drug management. Decision regarding hospitalization.   Patient care handed off from Evalee Jefferson, PA-C at shift change.  Patient unable to give history so history is obtained by daughter.  Patient discharged from [facility] on 11/22 after admission for urosepsis and metabolic encephalopathy.  After discharge, patient noncompliant with at home medicines not taking oral antibiotics as prescribed.  Daughter reports patient's generalized weakness is gotten increasingly worse having to be assisted moving anywhere in the home.  Daughter also notes intermittent fever at home taken orally greater than 100 F which responded well to Tylenol.  She also reports left lower extremity redness of which the patient has been scratching and complaining of pain for the past 3 to 4 days.  When asking patient questioning, she is unaware why she is here and has no current complaints.  Past medical history significant for hypertension, hyperlipidemia, diabetes mellitus, coronary artery disease  Laboratory studies: No leukocytosis noted.  Evidence of anemia with a hemoglobin of 9.7 which is near patient's baseline from prior studies shown.  Platelets within normal range.  UA significant for small leukocyte, few bacteria, 11-20 WBC indicative of  urinary tract infection without hematuria.  Blood cultures pending.  PT/INR within normal limits.  aPTT within normal limits.  CK within normal limits.  Initial lactate of 2.0 with repeat 1.4.  No electrolyte abnormalities noted.  Renal function significant for creatinine 1.47, BUN of 21 and GFR of 40 of which is near patient's baseline from hospital discharge.  Urine culture pending.  UDS positive for benzos but negative for opiates, cocaine, THC and barbiturates.  Imaging studies: Chest x-ray: Low lung volumes with subtle density at left lung base.  Likely atelectasis but difficult to exclude infectious etiology. CT head:*** CT abdomen pelvis:***  Medications: Patient given 1007 and 43 cc of IV fluids given concern for septic presentation.  Patient also given broad-spectrum antibiotics in the form of Rocephin given evidence of urinary tract infection with history of medical noncompliance with at home antibiotics as well as Tylenol for fever.

## 2022-05-03 NOTE — ED Notes (Signed)
Pt ambulated to restroom with steady gait.

## 2022-05-03 NOTE — ED Provider Notes (Signed)
[facility] EMERGENCY DEPARTMENT Provider Note   CSN: [id number] Arrival date & time: 05/03/22  1656     History  Chief Complaint  Patient presents with   Headache   Fever    Anita [name] is a 61 y.o. female with a history including type 2 diabetes, NASH, chronic neck pain on oxycodone, hypertension, migraine headaches, history of CVA, was discharged from [facility] on November 22 where she was pregnant admitted for acute metabolic encephalopathy, was found to have E. coli pyelonephritis with bacteremia.  She reportedly completed a course of p.o. antibiotics once she was dispo to home, but family at bedside and per phone are unsure whether she has been caring for herself properly including completing her antibiotics.  She had profound weakness after her hospitalization, physical therapy in her home was ordered but was never followed up on.  Daughter, Anita [name] was contacted per phone with patient's permission who states her mother really has not been able to care for herself properly since being discharged home.  Of note, her husband also recently had a partial foot amputation so has been unable to care for her as well.  Daughter was at the home today and patient was noted to be increasingly confused, she felt hot, concern for return of her infection and fever and probably called EMS.  Patient denies dysuria, denies abdominal pain, nausea or vomiting.  She does endorse feeling very weak, but is unable to elaborate further regarding her symptoms.  She is unsure whether she completed the antibiotic course once home (cefadroxil).    The history is provided by the patient.       Home Medications Prior to Admission medications   Medication Sig Start Date End Date Taking? Authorizing Provider  acetaminophen (TYLENOL) 325 MG tablet Take 2 tablets (650 mg total) by mouth every 6 (six) hours as needed for mild pain, headache or fever (or Fever >/= 101). 11/13/21   Roxan Hockey, MD   ALPRAZolam Duanne Moron) 1 MG tablet Take 0.5 mg by mouth in the morning, at noon, in the evening, and at bedtime.    [provider]  amLODipine (NORVASC) 10 MG tablet Take 1 tablet (10 mg total) by mouth daily. 11/14/21   Roxan Hockey, MD  ascorbic acid (VITAMIN C) 500 MG tablet Take 1 tablet (500 mg total) by mouth daily. 11/14/21   Roxan Hockey, MD  aspirin EC 81 MG tablet Take 1 tablet (81 mg total) by mouth daily with breakfast. 11/13/21 11/13/22  Roxan Hockey, MD  atorvastatin (LIPITOR) 20 MG tablet Take 20 mg by mouth daily. 1 Tablet Daily    [provider]  Cholecalciferol (VITAMIN D-3) 125 MCG (5000 UT) TABS Take 5,000 Units by mouth daily.    [provider]  cyanocobalamin (VITAMIN B12) 1000 MCG tablet Take 1 tablet (1,000 mcg total) by mouth daily. 04/20/22   Hongalgi, Lenis Dickinson, MD  EPINEPHrine 0.3 mg/0.3 mL IJ SOAJ injection Inject 0.3 mg into the muscle as needed for anaphylaxis. Inject as needed for anaphlaxis    [provider]  gabapentin (NEURONTIN) 600 MG tablet Take 600 mg by mouth 3 (three) times daily. 10/27/21   [provider]  glipiZIDE (GLUCOTROL) 5 MG tablet Take 1 tablet (5 mg total) by mouth 2 (two) times daily before a meal. 04/20/22   Hongalgi, Lenis Dickinson, MD  hydrALAZINE (APRESOLINE) 50 MG tablet Take 1 tablet (50 mg total) by mouth 2 (two) times daily. 11/13/21 11/13/22  Roxan Hockey, MD  isosorbide mononitrate (IMDUR) 30 MG 24 hr tablet Take 1 tablet (30 mg total) by mouth daily. 11/13/21 11/13/22  Roxan Hockey, MD  levothyroxine (SYNTHROID) 25 MCG tablet Take 25 mcg by mouth daily. 10/05/21   [provider]  metFORMIN (GLUCOPHAGE) 500 MG tablet Take 500 mg by mouth 2 (two) times daily with a meal.    [provider]  metoprolol tartrate (LOPRESSOR) 100 MG tablet Take 100 mg by mouth 2 (two) times a day. 06/15/18   [provider]  Multiple Vitamin (MULTIVITAMIN WITH MINERALS) TABS tablet Take  1 tablet by mouth daily. 04/21/22   Hongalgi, Lenis Dickinson, MD  nicotine (NICODERM CQ - DOSED IN MG/24 HOURS) 21 mg/24hr patch Place 1 patch (21 mg total) onto the skin daily. 04/21/22   Hongalgi, Lenis Dickinson, MD  oxyCODONE (OXY IR/ROXICODONE) 5 MG immediate release tablet Take 15 mg by mouth every 6 (six) hours as needed (pain.). 08/29/19   [provider]  polyethylene glycol powder (GLYCOLAX/MIRALAX) 17 GM/SCOOP powder Take 17 g by mouth daily. 04/20/22   Hongalgi, Lenis Dickinson, MD  thiamine (VITAMIN B1) 100 MG tablet Take 1 tablet (100 mg total) by mouth daily. 04/21/22   Hongalgi, Lenis Dickinson, MD  tiZANidine (ZANAFLEX) 4 MG tablet Take 1 tablet (4 mg total) by mouth at bedtime. 11/13/21   Roxan Hockey, MD  zinc sulfate 220 (50 Zn) MG capsule Take 1 capsule (220 mg total) by mouth daily. 11/14/21   Roxan Hockey, MD      Allergies    Ramipril, Imitrex [sumatriptan], Lyrica [pregabalin], and Penicillins    Review of Systems   Review of Systems  Unable to perform ROS: Mental status change  Constitutional:  Positive for fatigue and fever.  Neurological:  Positive for headaches.  All other systems reviewed and are negative.   Physical Exam Updated Vital Signs BP (!) 169/69 (BP Location: Left Arm)   Pulse (!) 102   Temp 99.7 F (37.6 C) (Oral)   Resp 16   Ht '5\' 5"'$  (1.651 m)   Wt 58.1 kg   SpO2 96%   BMI 21.30 kg/m  Physical Exam Vitals and nursing note reviewed.  Constitutional:      General: She is in acute distress.     Appearance: She is well-developed.  HENT:     Head: Normocephalic and atraumatic.  Eyes:     Extraocular Movements: Extraocular movements intact.     Conjunctiva/sclera: Conjunctivae normal.     Pupils: Pupils are equal, round, and reactive to light.  Cardiovascular:     Rate and Rhythm: Regular rhythm. Tachycardia present.     Heart sounds: Normal heart sounds.  Pulmonary:     Effort: Pulmonary effort is normal.     Breath sounds: Normal breath sounds. No  wheezing.  Abdominal:     General: Bowel sounds are normal.     Palpations: Abdomen is soft.     Tenderness: There is no abdominal tenderness.  Musculoskeletal:        General: Normal range of motion.     Cervical back: Normal range of motion. No rigidity.  Lymphadenopathy:     Cervical: No cervical adenopathy.  Skin:    General: Skin is warm and dry.     Findings: Erythema present.     Comments: Multiple superficial linear abrasions left lower leg with surrounding erythema (question pt has been scratching leg with fingernails?, pt unable to confirm.   Neurological:     Mental Status: She is  lethargic and confused.     Comments: Defers to sister at bedside and dg on phone for most of history.  Confused.     ED Results / Procedures / Treatments   Labs (all labs ordered are listed, but only abnormal results are displayed) Labs Reviewed  CBC WITH DIFFERENTIAL/PLATELET - Abnormal; Notable for the following components:      Result Value   RBC 3.81 (*)    Hemoglobin 9.7 (*)    HCT 32.4 (*)    MCH 25.5 (*)    MCHC 29.9 (*)    RDW 16.6 (*)    Neutro Abs 8.3 (*)    All other components within normal limits  URINALYSIS, ROUTINE W REFLEX MICROSCOPIC - Abnormal; Notable for the following components:   APPearance HAZY (*)    Glucose, UA >=500 (*)    Hgb urine dipstick SMALL (*)    Protein, ur 30 (*)    Leukocytes,Ua SMALL (*)    Bacteria, UA FEW (*)    All other components within normal limits  RESP PANEL BY RT-PCR (FLU A&B, COVID) ARPGX2  CULTURE, BLOOD (ROUTINE X 2)  CULTURE, BLOOD (ROUTINE X 2)  URINE CULTURE  PROTIME-INR  APTT  AMMONIA  LACTIC ACID, PLASMA  LACTIC ACID, PLASMA  COMPREHENSIVE METABOLIC PANEL  CK    EKG None  Radiology DG Chest Port 1 View  Result Date: 05/03/2022 CLINICAL DATA:  Questionable sepsis.  Evaluate for an abnormality. EXAM: PORTABLE CHEST 1 VIEW COMPARISON:  04/15/2022 FINDINGS: Single-view of the chest demonstrates low lung volumes.  Subtle densities at the left lung base. Heart size is stable with median sternotomy wires. Negative for a pneumothorax. IMPRESSION: Low lung volumes with subtle densities at the left lung base. Findings likely represent atelectasis but difficult to exclude subtle infection. Consider follow-up two view chest for further characterization. Electronically Signed   By: Markus Daft M.D.   On: 05/03/2022 18:52    Procedures Procedures    Medications Ordered in ED Medications  lactated ringers bolus 1,743 mL (has no administration in time range)  acetaminophen (TYLENOL) tablet 650 mg (650 mg Oral Given 05/03/22 1716)  cefTRIAXone (ROCEPHIN) 1 g in sodium chloride 0.9 % 100 mL IVPB (1 g Intravenous New Bag/Given 05/03/22 1925)    ED Course/ Medical Decision Making/ A&P                           Medical Decision Making Pt with presentation concerning for return of her urosepsis.  Initially she appeared fairly confused, withdrawn not giving much detail.  At reexam she is more alert and interactive.  Low-grade fever at initial presentation of 100, tachycardia at 102 but blood pressure is elevated at 169/69.  Pending labs, blood cultures, urinalysis, patient may require admission pending lab results.  IV fluids and Rocephin have been ordered.  Discussed with Dion Saucier, PA who  will dispo patient once labs have resulted.  Amount and/or Complexity of Data Reviewed Labs: ordered. Radiology: ordered. ECG/medicine tests: ordered.  Risk OTC drugs.           Final Clinical Impression(s) / ED Diagnoses Final diagnoses:  None    Rx / DC Orders ED Discharge Orders     None         Landis Martins 05/03/22 1956    Kommor, Debe Coder, MD 05/04/22 [phone]

## 2022-05-03 NOTE — ED Provider Notes (Signed)
11:30 PM Assumed care from Dr. Matilde Sprang, please see their note for full history, physical and decision making until this point. In brief this is a 61 y.o. year old female who presented to the ED tonight with Headache and Fever     Noncompliant with UTI meds at home from recent hospitalization. Here with worsening symptoms of same. Pending CT scan to evaluate for pyelo vs abscess and likely admission 2/2 encephalopathy.   CT A/P done and showed no obvious pyelo/hydro (independently viewed and interpreted by myself and radiology read reviewed).  D/w Dr. Josephine Cables, for admit.   Labs, studies and imaging reviewed by myself and considered in medical decision making if ordered. Imaging interpreted by radiology.  Labs Reviewed  LACTIC ACID, PLASMA - Abnormal; Notable for the following components:      Result Value   Lactic Acid, Venous 2.0 (*)    All other components within normal limits  COMPREHENSIVE METABOLIC PANEL - Abnormal; Notable for the following components:   Glucose, Bld 263 (*)    Creatinine, Ser 1.47 (*)    Albumin 3.4 (*)    GFR, Estimated 40 (*)    All other components within normal limits  CBC WITH DIFFERENTIAL/PLATELET - Abnormal; Notable for the following components:   RBC 3.81 (*)    Hemoglobin 9.7 (*)    HCT 32.4 (*)    MCH 25.5 (*)    MCHC 29.9 (*)    RDW 16.6 (*)    Neutro Abs 8.3 (*)    All other components within normal limits  URINALYSIS, ROUTINE W REFLEX MICROSCOPIC - Abnormal; Notable for the following components:   APPearance HAZY (*)    Glucose, UA >=500 (*)    Hgb urine dipstick SMALL (*)    Protein, ur 30 (*)    Leukocytes,Ua SMALL (*)    Bacteria, UA FEW (*)    All other components within normal limits  CK - Abnormal; Notable for the following components:   Total CK 33 (*)    All other components within normal limits  RAPID URINE DRUG SCREEN, HOSP PERFORMED - Abnormal; Notable for the following components:   Benzodiazepines POSITIVE (*)    All other  components within normal limits  RESP PANEL BY RT-PCR (FLU A&B, COVID) ARPGX2  CULTURE, BLOOD (ROUTINE X 2)  CULTURE, BLOOD (ROUTINE X 2)  URINE CULTURE  LACTIC ACID, PLASMA  PROTIME-INR  APTT  AMMONIA    CT Abdomen Pelvis W Contrast  Final Result    CT Head Wo Contrast  Final Result    DG Chest 2 View  Final Result    DG Chest Port 1 View  Final Result      No follow-ups on file.    Merrily Pew, MD 05/04/22 [phone]

## 2022-05-03 NOTE — ED Triage Notes (Signed)
Pt BIBA from home. Pt states that she has had a fever, as well as headache. Pt c/o left leg pain with scratches/scab.

## 2022-05-04 ENCOUNTER — Other Ambulatory Visit: Payer: Self-pay

## 2022-05-04 DIAGNOSIS — E1165 Type 2 diabetes mellitus with hyperglycemia: Secondary | ICD-10-CM | POA: Diagnosis not present

## 2022-05-04 DIAGNOSIS — D509 Iron deficiency anemia, unspecified: Secondary | ICD-10-CM | POA: Insufficient documentation

## 2022-05-04 DIAGNOSIS — I10 Essential (primary) hypertension: Secondary | ICD-10-CM

## 2022-05-04 DIAGNOSIS — L89153 Pressure ulcer of sacral region, stage 3: Secondary | ICD-10-CM

## 2022-05-04 DIAGNOSIS — E039 Hypothyroidism, unspecified: Secondary | ICD-10-CM | POA: Diagnosis not present

## 2022-05-04 DIAGNOSIS — E46 Unspecified protein-calorie malnutrition: Secondary | ICD-10-CM

## 2022-05-04 DIAGNOSIS — E8809 Other disorders of plasma-protein metabolism, not elsewhere classified: Secondary | ICD-10-CM

## 2022-05-04 DIAGNOSIS — N1832 Chronic kidney disease, stage 3b: Secondary | ICD-10-CM | POA: Insufficient documentation

## 2022-05-04 DIAGNOSIS — F419 Anxiety disorder, unspecified: Secondary | ICD-10-CM

## 2022-05-04 DIAGNOSIS — G894 Chronic pain syndrome: Secondary | ICD-10-CM

## 2022-05-04 DIAGNOSIS — L89159 Pressure ulcer of sacral region, unspecified stage: Secondary | ICD-10-CM | POA: Insufficient documentation

## 2022-05-04 DIAGNOSIS — E872 Acidosis, unspecified: Secondary | ICD-10-CM

## 2022-05-04 DIAGNOSIS — N3001 Acute cystitis with hematuria: Secondary | ICD-10-CM | POA: Diagnosis not present

## 2022-05-04 DIAGNOSIS — N3000 Acute cystitis without hematuria: Secondary | ICD-10-CM | POA: Diagnosis not present

## 2022-05-04 DIAGNOSIS — E782 Mixed hyperlipidemia: Secondary | ICD-10-CM

## 2022-05-04 LAB — COMPREHENSIVE METABOLIC PANEL
ALT: 11 U/L (ref 0–44)
AST: 13 U/L — ABNORMAL LOW (ref 15–41)
Albumin: 3.1 g/dL — ABNORMAL LOW (ref 3.5–5.0)
Alkaline Phosphatase: 69 U/L (ref 38–126)
Anion gap: 7 (ref 5–15)
BUN: 18 mg/dL (ref 8–23)
CO2: 26 mmol/L (ref 22–32)
Calcium: 8.8 mg/dL — ABNORMAL LOW (ref 8.9–10.3)
Chloride: 107 mmol/L (ref 98–111)
Creatinine, Ser: 1.24 mg/dL — ABNORMAL HIGH (ref 0.44–1.00)
GFR, Estimated: 50 mL/min — ABNORMAL LOW (ref >60)
Glucose, Bld: 143 mg/dL — ABNORMAL HIGH (ref 70–99)
Potassium: 4.7 mmol/L (ref 3.5–5.1)
Sodium: 140 mmol/L (ref 135–145)
Total Bilirubin: 0.6 mg/dL (ref 0.3–1.2)
Total Protein: 6.9 g/dL (ref 6.5–8.1)

## 2022-05-04 LAB — CBC
HCT: 31 % — ABNORMAL LOW (ref 36.0–46.0)
Hemoglobin: 9.5 g/dL — ABNORMAL LOW (ref 12.0–15.0)
MCH: 25.7 pg — ABNORMAL LOW (ref 26.0–34.0)
MCHC: 30.6 g/dL (ref 30.0–36.0)
MCV: 84 fL (ref 80.0–100.0)
Platelets: 304 10*3/uL (ref 150–400)
RBC: 3.69 MIL/uL — ABNORMAL LOW (ref 3.87–5.11)
RDW: 16.6 % — ABNORMAL HIGH (ref 11.5–15.5)
WBC: 7.9 10*3/uL (ref 4.0–10.5)
nRBC: 0 % (ref 0.0–0.2)

## 2022-05-04 LAB — CBG MONITORING, ED
Glucose-Capillary: 269 mg/dL — ABNORMAL HIGH (ref 70–99)
Glucose-Capillary: 216 mg/dL — ABNORMAL HIGH (ref 70–99)

## 2022-05-04 LAB — GLUCOSE, CAPILLARY
Glucose-Capillary: 270 mg/dL — ABNORMAL HIGH (ref 70–99)
Glucose-Capillary: 231 mg/dL — ABNORMAL HIGH (ref 70–99)

## 2022-05-04 LAB — MAGNESIUM: Magnesium: 1.9 mg/dL (ref 1.7–2.4)

## 2022-05-04 LAB — PHOSPHORUS: Phosphorus: 4.1 mg/dL (ref 2.5–4.6)

## 2022-05-04 MED ORDER — ACETAMINOPHEN 650 MG RE SUPP: 650.0000 mg | Freq: Four times a day (QID) | RECTAL | Status: DC | PRN

## 2022-05-04 MED ORDER — ISOSORBIDE MONONITRATE ER 60 MG PO TB24
30.0000 mg | ORAL_TABLET | Freq: Every day | ORAL | Status: DC
  Administered 2022-05-04 – 2022-05-06 (×3): 30 mg via ORAL
  Filled 2022-05-04 (×4): qty 1

## 2022-05-04 MED ORDER — SODIUM CHLORIDE 0.9 % IV SOLN
1.0000 g | INTRAVENOUS | Status: DC
  Administered 2022-05-04 – 2022-05-06 (×3): 1 g via INTRAVENOUS
  Filled 2022-05-04 (×4): qty 10

## 2022-05-04 MED ORDER — LEVOTHYROXINE SODIUM 25 MCG PO TABS
25.0000 ug | ORAL_TABLET | Freq: Every day | ORAL | Status: DC
  Administered 2022-05-04 – 2022-05-07 (×4): 25 ug via ORAL
  Filled 2022-05-04 (×4): qty 1

## 2022-05-04 MED ORDER — METOPROLOL SUCCINATE ER 50 MG PO TB24
100.0000 mg | ORAL_TABLET | Freq: Every day | ORAL | Status: DC
  Administered 2022-05-04 – 2022-05-07 (×4): 100 mg via ORAL
  Filled 2022-05-04 (×4): qty 2

## 2022-05-04 MED ORDER — ACETAMINOPHEN 325 MG PO TABS
650.0000 mg | ORAL_TABLET | Freq: Four times a day (QID) | ORAL | Status: DC | PRN
  Administered 2022-05-04 – 2022-05-05 (×3): 650 mg via ORAL
  Filled 2022-05-04 (×3): qty 2

## 2022-05-04 MED ORDER — INSULIN ASPART 100 UNIT/ML IJ SOLN
0.0000 [IU] | Freq: Three times a day (TID) | INTRAMUSCULAR | Status: DC
  Administered 2022-05-04: 3 [IU] via SUBCUTANEOUS
  Administered 2022-05-04 (×2): 5 [IU] via SUBCUTANEOUS
  Administered 2022-05-05: 2 [IU] via SUBCUTANEOUS
  Administered 2022-05-05 – 2022-05-06 (×3): 3 [IU] via SUBCUTANEOUS
  Administered 2022-05-06 – 2022-05-07 (×3): 2 [IU] via SUBCUTANEOUS
  Administered 2022-05-07: 3 [IU] via SUBCUTANEOUS
  Filled 2022-05-04 (×2): qty 1

## 2022-05-04 MED ORDER — FERROUS SULFATE 325 (65 FE) MG PO TABS
325.0000 mg | ORAL_TABLET | ORAL | Status: DC
  Administered 2022-05-04 – 2022-05-06 (×2): 325 mg via ORAL
  Filled 2022-05-04 (×2): qty 1

## 2022-05-04 MED ORDER — LABETALOL HCL 5 MG/ML IV SOLN
10.0000 mg | INTRAVENOUS | Status: DC | PRN
  Administered 2022-05-07: 10 mg via INTRAVENOUS
  Filled 2022-05-04: qty 4

## 2022-05-04 MED ORDER — ENOXAPARIN SODIUM 40 MG/0.4ML IJ SOSY
40.0000 mg | PREFILLED_SYRINGE | INTRAMUSCULAR | Status: DC
  Administered 2022-05-04 – 2022-05-07 (×4): 40 mg via SUBCUTANEOUS
  Filled 2022-05-04 (×4): qty 0.4

## 2022-05-04 MED ORDER — ALPRAZOLAM 0.5 MG PO TABS
0.5000 mg | ORAL_TABLET | Freq: Two times a day (BID) | ORAL | Status: DC | PRN
  Administered 2022-05-04: 0.5 mg via ORAL
  Filled 2022-05-04: qty 1

## 2022-05-04 MED ORDER — ONDANSETRON HCL 4 MG/2ML IJ SOLN
4.0000 mg | Freq: Four times a day (QID) | INTRAMUSCULAR | Status: DC | PRN
  Administered 2022-05-04: 4 mg via INTRAVENOUS
  Filled 2022-05-04: qty 2

## 2022-05-04 MED ORDER — AMLODIPINE BESYLATE 5 MG PO TABS
10.0000 mg | ORAL_TABLET | Freq: Every day | ORAL | Status: DC
  Administered 2022-05-04 – 2022-05-07 (×4): 10 mg via ORAL
  Filled 2022-05-04 (×4): qty 2

## 2022-05-04 MED ORDER — HYDRALAZINE HCL 25 MG PO TABS
50.0000 mg | ORAL_TABLET | Freq: Two times a day (BID) | ORAL | Status: DC
  Administered 2022-05-04: 50 mg via ORAL
  Filled 2022-05-04: qty 2

## 2022-05-04 MED ORDER — INSULIN GLARGINE-YFGN 100 UNIT/ML ~~LOC~~ SOLN: 6.0000 [IU] | Freq: Every day | SUBCUTANEOUS | Status: DC

## 2022-05-04 MED ORDER — HYDRALAZINE HCL 25 MG PO TABS
100.0000 mg | ORAL_TABLET | Freq: Two times a day (BID) | ORAL | Status: DC
  Administered 2022-05-04 – 2022-05-07 (×6): 100 mg via ORAL
  Filled 2022-05-04 (×6): qty 4

## 2022-05-04 MED ORDER — OXYCODONE HCL 5 MG PO TABS
10.0000 mg | ORAL_TABLET | Freq: Three times a day (TID) | ORAL | Status: DC | PRN
  Administered 2022-05-04 – 2022-05-05 (×4): 10 mg via ORAL
  Filled 2022-05-04 (×4): qty 2

## 2022-05-04 MED ORDER — ATORVASTATIN CALCIUM 20 MG PO TABS
20.0000 mg | ORAL_TABLET | Freq: Every day | ORAL | Status: DC
  Administered 2022-05-04 – 2022-05-07 (×4): 20 mg via ORAL
  Filled 2022-05-04: qty 1
  Filled 2022-05-04: qty 2
  Filled 2022-05-04 (×2): qty 1

## 2022-05-04 MED ORDER — ONDANSETRON HCL 4 MG PO TABS: 4.0000 mg | ORAL_TABLET | Freq: Four times a day (QID) | ORAL | Status: DC | PRN

## 2022-05-04 MED ORDER — INSULIN GLARGINE-YFGN 100 UNIT/ML ~~LOC~~ SOLN
6.0000 [IU] | Freq: Every day | SUBCUTANEOUS | Status: DC
  Administered 2022-05-04 – 2022-05-06 (×3): 6 [IU] via SUBCUTANEOUS
  Filled 2022-05-04 (×4): qty 0.06

## 2022-05-04 MED ORDER — GLUCERNA SHAKE PO LIQD
237.0000 mL | Freq: Three times a day (TID) | ORAL | Status: DC
  Filled 2022-05-04 (×8): qty 237

## 2022-05-04 MED ORDER — INSULIN ASPART 100 UNIT/ML IJ SOLN
0.0000 [IU] | Freq: Every day | INTRAMUSCULAR | Status: DC
  Administered 2022-05-04: 2 [IU] via SUBCUTANEOUS

## 2022-05-04 NOTE — Consult Note (Signed)
[facility] Nurse Consult Note: Reason for Consult: Pressure injury Patient noted to have had pressure injury documented during last admission as well Wound type: Stage 2 Pressure Injury Pressure Injury POA: Yes Measurement: > 0.5cm x 0.5cm x 0.1cm  Wound QHK:UVJDYN crust  Drainage (amount, consistency, odor) none appreciated in the images taken Periwound: some surrounding redness and linear areas on the left buttock, scratching? Or MARSI (medical adhesive related skin injury) Dressing procedure/placement/frequency: Utilize the nursing skin care order set, using silicone foam to protect and insulate, change every 3 days and PRN soilage. Turn and reposition per hospital policy  Re consult if needed, will not follow at this time. Thanks  Danna Casella [employer], RN,CWOCN, CNS, [place] [phone])

## 2022-05-04 NOTE — Progress Notes (Signed)
   05/04/22 1614  Assess: MEWS Score  Temp 97.9 F (36.6 C)  BP (!) 182/84  MAP (mmHg) 111  Pulse Rate (!) 121  Resp 16  SpO2 97 %  O2 Device Room Air  Assess: MEWS Score  MEWS Temp 0  MEWS Systolic 0  MEWS Pulse 2  MEWS RR 0  MEWS LOC 0  MEWS Score 2  MEWS Score Color Yellow  Assess: if the MEWS score is Yellow or Red  Were vital signs taken at a resting state? Yes  Does the patient meet 2 or more of the SIRS criteria? No  Treat  Pain Score 0  Take Vital Signs  Increase Vital Sign Frequency  Yellow: Q 2hr X 2 then Q 4hr X 2, if remains yellow, continue Q 4hrs  Escalate  MEWS: Escalate Yellow: discuss with charge nurse/RN and consider discussing with provider and RRT  Notify: Charge Nurse/RN  Name of Charge Nurse/RN Notified [place]  Date Charge Nurse/RN Notified 05/04/22  Time Charge Nurse/RN Notified 1802  Provider Notification  Provider Name/Title Maurene Capes  Date Provider Notified 05/04/22  Time Provider Notified 1620  Method of Notification Page  Provider response No new orders  Document  Patient Outcome Other (Comment) (waiting on orders)  Progress note created (see row info) Yes  Assess: SIRS CRITERIA  SIRS Temperature  0  SIRS Pulse 1  SIRS Respirations  0  SIRS WBC 1  SIRS Score Sum  2

## 2022-05-04 NOTE — Progress Notes (Signed)
Dr. Maurene Capes contacted concerning elevated bp after 1614 vitals.   Received no new orders.   Called made and reached him just now and he was in ED talking with a family and will address bp  shortly.  Patient's daughter at bedside

## 2022-05-04 NOTE — Progress Notes (Signed)
Patient seen and evaluated, chart reviewed, please see EMR for updated orders. Please see full H&P dictated by admitting physician Dr Josephine Cables for same date of service.  Brief Summary:-  61 y.o. female with medical history significant of hypertension, hyperlipidemia, CKD stage IIIb, hypothyroidism, CAD, lacunar stroke and chronic pain admitted on 05/04/2022 with concerns about possible UTI   A/p 1)UTI --POA CT of abdomen and pelvis was suggestive of cystitis/pyelitis -No fevers or leukocytosis -Continue IV Rocephin pending culture data   2)Acute metabolic encephalopathy -Multifactorial in the setting of UTI and ongoing opiate and benzo use Urine drug screen was positive for benzodiazepine (patient takes Xanax at home) -Admitting provider attempted to de-escalate opiate but patient and family are requesting that opiates be increased again   3) sacral pressure injury----POA -please see photos in epic. = Wound care consult appreciated-recommendations as follows:- Utilize the nursing skin care order set, using silicone foam to protect and insulate, change every 3 days and PRN soilage. Turn and reposition per hospital policy   Iron deficiency anemia Continue ferrous sulfate   AKI on CKD 3A Creatinine of 1.47 on admission -Improving with hydration Renally adjust medications, avoid nephrotoxic agents/dehydration/hypotension   Hypoalbuminemia possibly secondary to mild protein calorie malnutrition Protein supplement will be provided   Type 2 diabetes mellitus with uncontrolled hyperglycemia hemoglobin A1c on 04/18/2022 was 7.5--reflecting uncontrolled diabetes with hyperglycemia -Continue Lantus insulin Use Novolog/Humalog Sliding scale insulin with Accu-Cheks/Fingersticks as ordered    Essential hypertension--stage II -BP not at goal -BP meds adjusted including amlodipine 10 mg daily, hydralazine 100 mg twice daily and isosorbide 30 mg daily, Toprol 100 mg daily -May use IV labetalol  as needed elevated BP    Mixed hyperlipidemia Continue Lipitor   Acquired hypothyroidism Continue Synthroid   Chronic pain syndrome --Admitting provider attempted to de-escalate opiate due to metabolic encephalopathy/altered mentation , but patient and family are requesting that opiates be increased again   Anxiety Restart Xanax patient and family request  Patient seen and evaluated, chart reviewed, please see EMR for updated orders. Please see full H&P dictated by admitting physician Dr Josephine Cables for same date of service.   Total care time is 53 minutes  Roxan Hockey, MD

## 2022-05-04 NOTE — Progress Notes (Signed)
   05/04/22 1754  Assess: MEWS Score  BP (!) 197/87  MAP (mmHg) 119  Pulse Rate (!) 130  SpO2 96 %  Assess: MEWS Score  MEWS Temp 0  MEWS Systolic 0  MEWS Pulse 3  MEWS RR 0  MEWS LOC 0  MEWS Score 3  MEWS Score Color Yellow  Assess: if the MEWS score is Yellow or Red  Were vital signs taken at a resting state? Yes  Treat  Pain Score 5  Take Vital Signs  Increase Vital Sign Frequency  Yellow: Q 2hr X 2 then Q 4hr X 2, if remains yellow, continue Q 4hrs  Escalate  MEWS: Escalate Yellow: discuss with charge nurse/RN and consider discussing with provider and RRT  Notify: Charge Nurse/RN  Name of Charge Nurse/RN Notified Angel  Date Charge Nurse/RN Notified 05/04/22  Time Charge Nurse/RN Notified 1755  Provider Notification  Provider Name/Title Maurene Capes  Date Provider Notified 05/04/22  Time Provider Notified 1810  Method of Notification Call  Provider response See new orders  Document  Patient Outcome Other (Comment) (will give ordered bp meds)  Progress note created (see row info) Yes  Assess: SIRS CRITERIA  SIRS Temperature  0  SIRS Pulse 1  SIRS Respirations  0  SIRS WBC 1  SIRS Score Sum  2
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
## 2022-05-04 NOTE — TOC Initial Note (Signed)
Transition of Care [facility]) - Initial/Assessment Note    Patient Details  Name: Anita [name] MRN: [id number] Date of Birth: 24-Mar-1961  Transition of Care [facility]) CM/SW Contact:    Boneta Lucks, RN Phone Number: 05/04/2022, 4:11 PM  Clinical Narrative:      Patient admitted with UTI. Patient has a high risk for readmission. Patient lives at home with her husband, she does not drive, he provides transportation. She uses a cane or walker. She states she does not need any new equipment or have any need for home health.  DC plan for 2 days.           Expected Discharge Plan: Home/Self Care Barriers to Discharge: Continued Medical Work up   Patient Goals and CMS Choice Patient states their goals for this hospitalization and ongoing recovery are:: to go home. CMS Medicare.gov Compare Post Acute Care list provided to:: Patient Choice offered to / list presented to : Patient  Expected Discharge Plan and Services Expected Discharge Plan: Home/Self Care       Living arrangements for the past 2 months: Single Family Home          Prior Living Arrangements/Services Living arrangements for the past 2 months: Single Family Home Lives with:: Spouse Patient language and need for interpreter reviewed:: Yes Do you feel safe going back to the place where you live?: Yes      Need for Family Participation in Patient Care: Yes (Comment) Care giver support system in place?: Yes (comment) Current home services: DME Criminal Activity/Legal Involvement Pertinent to Current Situation/Hospitalization: No - Comment as needed  Activities of Daily Living Home Assistive Devices/Equipment: Walker (specify type), Cane (specify quad or straight) ADL Screening (condition at time of admission) Patient's cognitive ability adequate to safely complete daily activities?: Yes Is the patient deaf or have difficulty hearing?: No Does the patient have difficulty seeing, even when wearing glasses/contacts?: No Does  the patient have difficulty concentrating, remembering, or making decisions?: No Patient able to express need for assistance with ADLs?: Yes Does the patient have difficulty dressing or bathing?: No Independently performs ADLs?: Yes (appropriate for developmental age) Communication: Independent Dressing (OT): Independent Is this a change from baseline?: Pre-admission baseline Grooming: Independent Feeding: Independent Toileting: Independent In/Out Bed: Independent Is this a change from baseline?: Pre-admission baseline Walks in Home: Needs assistance Is this a change from baseline?: Pre-admission baseline Does the patient have difficulty walking or climbing stairs?: No Weakness of Legs: None Weakness of Arms/Hands: None  Permission Sought/Granted          Emotional Assessment     Affect (typically observed): Accepting Orientation: : Oriented to Self, Oriented to Place, Oriented to  Time, Oriented to Situation Alcohol / Substance Use: Not Applicable Psych Involvement: No (comment)  Admission diagnosis:  Confusion [R41.0] UTI (urinary tract infection) [N39.0] Acute cystitis without hematuria [N30.00] Patient Active Problem List   Diagnosis Date Noted   Hypoalbuminemia due to protein-calorie malnutrition ([place]) 05/04/2022   Iron deficiency anemia 05/04/2022   Sacral decubitus ulcer 05/04/2022   Chronic kidney disease, stage 3b ([place]) 05/04/2022   Mixed hyperlipidemia 05/04/2022   Acquired hypothyroidism 05/04/2022   Anxiety 05/04/2022   Chronic pain syndrome 05/04/2022   Seizure ([place]) 04/16/2022   Non-traumatic rhabdomyolysis 04/16/2022   Malnutrition of moderate degree 11/11/2021   COVID-19 virus infection 11/11/2021   Hypertensive emergency 11/11/2021   Sepsis ([place]) 11/10/2021   UTI (urinary tract infection) 11/10/2021   AKI (acute kidney injury) ([place]) 11/10/2021  Acute metabolic encephalopathy 91/47/8295   Chronic bilateral low back pain with left-sided sciatica  04/27/2017   Chest pain 08/08/2014   Angioedema 07/09/2013   Acute anaphylaxis 07/09/2013   Abdominal distention 02/01/2013   Tobacco abuse 62/13/0865   Nonalcoholic fatty liver disease 09/30/2012   Constipation 03/29/2012   Abdominal pain: RUQ, resolved at discharge  03/29/2012   ASD (atrial septal defect) repair 1988 03/08/2012   CAD (coronary artery disease), 30% LAD 9/12 03/08/2012   Essential hypertension 03/08/2012   Uncontrolled type 2 diabetes mellitus with hyperglycemia, without long-term current use of insulin ([place]) 03/08/2012   Dyslipidemia 03/08/2012   Abnormal EKG, incomplete RBBB 03/08/2012   Chronic pain due to trauma 03/08/2012   Unstable angina, negative MI, negative to mildly positive Nuc study Known 30% LAD disease, medical therapy 03/08/2012   PCP:  Redmond School, MD Pharmacy:   [place], [place] [address] [place] [address] Phone: [phone] Fax: [phone]  [facility] [address] [place] [address] Phone: [phone] Fax: [phone]   Readmission Risk Interventions    05/04/2022    4:10 PM  Readmission Risk Prevention Plan  Transportation Screening Complete  PCP or Specialist Appt within 3-5 Days Not Complete  HRI or [place] Complete  Social Work Consult for [place] Planning/Counseling Complete  Palliative Care Screening Not Applicable  Medication Review Press photographer) Complete

## 2022-05-04 NOTE — ED Notes (Signed)
Sitting up eating lunch. No signs of distress. VSS.

## 2022-05-05 ENCOUNTER — Inpatient Hospital Stay (HOSPITAL_COMMUNITY): Payer: Medicare Other

## 2022-05-05 DIAGNOSIS — N179 Acute kidney failure, unspecified: Secondary | ICD-10-CM

## 2022-05-05 DIAGNOSIS — G9341 Metabolic encephalopathy: Secondary | ICD-10-CM

## 2022-05-05 DIAGNOSIS — N390 Urinary tract infection, site not specified: Secondary | ICD-10-CM

## 2022-05-05 DIAGNOSIS — E119 Type 2 diabetes mellitus without complications: Secondary | ICD-10-CM | POA: Diagnosis not present

## 2022-05-05 LAB — GLUCOSE, CAPILLARY
Glucose-Capillary: 161 mg/dL — ABNORMAL HIGH (ref 70–99)
Glucose-Capillary: 249 mg/dL — ABNORMAL HIGH (ref 70–99)
Glucose-Capillary: 243 mg/dL — ABNORMAL HIGH (ref 70–99)
Glucose-Capillary: 167 mg/dL — ABNORMAL HIGH (ref 70–99)

## 2022-05-05 LAB — CBC
HCT: 29.6 % — ABNORMAL LOW (ref 36.0–46.0)
Hemoglobin: 9.2 g/dL — ABNORMAL LOW (ref 12.0–15.0)
MCH: 26.1 pg (ref 26.0–34.0)
MCHC: 31.1 g/dL (ref 30.0–36.0)
MCV: 84.1 fL (ref 80.0–100.0)
Platelets: 348 10*3/uL (ref 150–400)
RBC: 3.52 MIL/uL — ABNORMAL LOW (ref 3.87–5.11)
RDW: 17.2 % — ABNORMAL HIGH (ref 11.5–15.5)
WBC: 5.1 10*3/uL (ref 4.0–10.5)
nRBC: 0 % (ref 0.0–0.2)

## 2022-05-05 LAB — BASIC METABOLIC PANEL
Anion gap: 7 (ref 5–15)
BUN: 33 mg/dL — ABNORMAL HIGH (ref 8–23)
CO2: 24 mmol/L (ref 22–32)
Calcium: 8.4 mg/dL — ABNORMAL LOW (ref 8.9–10.3)
Chloride: 106 mmol/L (ref 98–111)
Creatinine, Ser: 1.74 mg/dL — ABNORMAL HIGH (ref 0.44–1.00)
GFR, Estimated: 33 mL/min — ABNORMAL LOW (ref >60)
Glucose, Bld: 172 mg/dL — ABNORMAL HIGH (ref 70–99)
Potassium: 4.5 mmol/L (ref 3.5–5.1)
Sodium: 137 mmol/L (ref 135–145)

## 2022-05-05 LAB — URINE CULTURE: Culture: NO GROWTH

## 2022-05-05 MED ORDER — SODIUM CHLORIDE 0.9 % IV SOLN: INTRAVENOUS | Status: DC

## 2022-05-05 NOTE — Evaluation (Signed)
Physical Therapy Evaluation Patient Details Name: Anita [name] MRN: [id number] DOB: May 01, 1961 Today's Date: 05/05/2022  History of Present Illness  Librada L Governale is a 61 y.o. female admitted on 05/05/11 for UTI with PMH significant for hypertension, hyperlipidemia, CKD stage IIIb, hypothyroidism, CAD, lacunar stroke and chronic pain.   Clinical Impression  Patient functioning near baseline for functional mobility and gait. Patient demonstrates good return for ambulation in hallway with SPC and no loss of balance. Patient left seated EOB after therapy. Plan: patient discharged from physical therapy to care of nursing for ambulation daily as tolerated for length of stay.     Recommendations for follow up therapy are one component of a multi-disciplinary discharge planning process, led by the attending physician.  Recommendations may be updated based on patient status, additional functional criteria and insurance authorization.  Follow Up Recommendations Home health PT      Assistance Recommended at Discharge PRN  Patient can return home with the following  Assistance with cooking/housework;Help with stairs or ramp for entrance    Equipment Recommendations None recommended by PT  Recommendations for Other Services       Functional Status Assessment Patient has not had a recent decline in their functional status     Precautions / Restrictions Precautions Precautions: None Restrictions Weight Bearing Restrictions: No      Mobility  Bed Mobility Overal bed mobility: Independent Bed Mobility: Supine to Sit, Sit to Supine     Supine to sit: Independent     General bed mobility comments: Pt able to perform bed mobility without difficulty    Transfers Overall transfer level: Modified independent Equipment used: Straight cane Transfers: Sit to/from Stand             General transfer comment: Patient able to stand without assist w/ [facility]     Ambulation/Gait Ambulation/Gait assistance: Modified independent (Device/Increase time) Gait Distance (Feet): 90 Feet Assistive device: Straight cane Gait Pattern/deviations: WFL(Within Functional Limits) Gait velocity: decreased     General Gait Details: Patient demonstrates good return for ambulation in hallway with SPC, no loss of balance, steady on feet  Stairs            Wheelchair Mobility    Modified Rankin (Stroke Patients Only)       Balance Overall balance assessment: Mild deficits observed, not formally tested                                           Pertinent Vitals/Pain Pain Assessment Pain Assessment: 0-10 Pain Score: 6  Pain Location: posterior head "nerve pain" Pain Descriptors / Indicators: Pins and needles, Tingling Pain Intervention(s): Limited activity within patient's tolerance, Monitored during session, Repositioned    Home Living Family/patient expects to be discharged to:: Private residence Living Arrangements: Spouse/significant other Available Help at Discharge: Family;Available PRN/intermittently Type of Home: Mobile home Home Access: Stairs to enter Entrance Stairs-Rails: Right;Left;Can reach both Entrance Stairs-Number of Steps: 3   Home Layout: One level Home Equipment: Conservation officer, nature (2 wheels);Cane - single point;BSC/3in1;Shower seat Additional Comments: Patient now helping husband as he recently had foot amputation sx, pt has daughter who can help both her and husband PRN (she works)    Prior Function Prior Level of Function : Independent/Modified Independent             Mobility Comments: 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, uses RW sometimes, does  not drive ADLs Comments: Independent     Hand Dominance   Dominant Hand: Right    Extremity/Trunk Assessment   Upper Extremity Assessment Upper Extremity Assessment: Overall WFL for tasks assessed    Lower Extremity Assessment Lower Extremity  Assessment: Overall WFL for tasks assessed    Cervical / Trunk Assessment Cervical / Trunk Assessment: Normal  Communication   Communication: No difficulties  Cognition Arousal/Alertness: Awake/alert Behavior During Therapy: WFL for tasks assessed/performed Overall Cognitive Status: Within Functional Limits for tasks assessed                                          General Comments      Exercises     Assessment/Plan    PT Assessment All further PT needs can be met in the next venue of care  PT Problem List Decreased strength;Decreased activity tolerance;Decreased balance;Decreased mobility       PT Treatment Interventions      PT Goals (Current goals can be found in the Care Plan section)  Acute Rehab PT Goals Patient Stated Goal: return home PT Goal Formulation: With patient Time For Goal Achievement: 05/05/22 Potential to Achieve Goals: Good    Frequency       Co-evaluation               AM-PAC PT "6 Clicks" Mobility  Outcome Measure Help needed turning from your back to your side while in a flat bed without using bedrails?: None Help needed moving from lying on your back to sitting on the side of a flat bed without using bedrails?: None Help needed moving to and from a bed to a chair (including a wheelchair)?: None Help needed standing up from a chair using your arms (e.g., wheelchair or bedside chair)?: None Help needed to walk in hospital room?: None Help needed climbing 3-5 steps with a railing? : A Little 6 Click Score: 23    End of Session   Activity Tolerance: Patient tolerated treatment well Patient left: in bed;with call bell/phone within reach (seated EOB) Nurse Communication: Mobility status PT Visit Diagnosis: Unsteadiness on feet (R26.81);Other abnormalities of gait and mobility (R26.89);Muscle weakness (generalized) (M62.81)    Time: 8502-7741 PT Time Calculation (min) (ACUTE ONLY): 15 min   Charges:   PT  Evaluation $PT Eval Low Complexity: 1 Low PT Treatments $Therapeutic Activity: 8-22 mins        Zigmund Gottron, SPT

## 2022-05-05 NOTE — TOC Progression Note (Addendum)
Transition of Care [facility]) - Progression Note    Patient Details  Name: Anita [name] MRN: [id number] Date of Birth: Mar 15, 1961  Transition of Care [facility]) CM/SW [place], [place] Phone Number: 05/05/2022, 3:09 PM  Clinical Narrative:    CSW updated that PT is recommending [facility] for pt at D/C. CSW spoke with pt who states that she is interested in [facility] at D/C. Pt does not have a preference in [facility] agency. CSW spoke to Judson Roch with [facility], she will have her office review. CSWupdated that [facility] can accept pts HH PT referral. TOC to follow.   Expected Discharge Plan: Home/Self Care Barriers to Discharge: Continued Medical Work up  Expected Discharge Plan and Services Expected Discharge Plan: Home/Self Care       Living arrangements for the past 2 months: Single Family Home                                       Social Determinants of Health (SDOH) Interventions Food Insecurity Interventions: Intervention Not Indicated Housing Interventions: Intervention Not Indicated Transportation Interventions: Intervention Not Indicated Utilities Interventions: Intervention Not Indicated  Readmission Risk Interventions    05/04/2022    4:10 PM  Readmission Risk Prevention Plan  Transportation Screening Complete  PCP or Specialist Appt within 3-5 Days Not Complete  HRI or Home Care Consult Complete  Social Work Consult for [place] Planning/Counseling Complete  Palliative Care Screening Not Applicable  Medication Review Press photographer) Complete

## 2022-05-05 NOTE — Progress Notes (Signed)
Pt reporting nausea after eating food from fast food restaurant.  IV zofran given with good relief.  Oxycodone and tylenol given for chronic head/neck pain.  Pt A/O x 4, forgetful, independent in room.  Denies any burning with urination.  Afebrile.  BP better managed, pt NSR/ST on monitor.  Dressing to coccyx CDI.

## 2022-05-05 NOTE — Progress Notes (Signed)
PROGRESS NOTE    Anita [name]  [id number]:[id number] DOB: 1960-10-12 DOA: 05/03/2022 PCP: Redmond School, MD     Brief Narrative:   H/o HTN, HLD, NIDDM2, lacunar infarct, possible afib vs nonsustained SVT on 11/18 during recent hospitalization from 11/16 to 11/21, presents with fever and confusion  Subjective:  She is not oriented to the year, not a reliable historian, reports intermittent left flank pain at home , today denies pain No fever, she has poor appetite , appear weak and confused, no n/v, denies bowel and bladder issues  Assessment & Plan:  Principal Problem:   UTI (urinary tract infection) Active Problems:   Acute metabolic encephalopathy   Essential hypertension   Uncontrolled type 2 diabetes mellitus with hyperglycemia, without long-term current use of insulin (HCC)   Hypoalbuminemia due to protein-calorie malnutrition (H[facility])   Iron deficiency anemia   Sacral decubitus ulcer   Chronic kidney disease, stage 3b ([place])   Mixed hyperlipidemia   Acquired hypothyroidism   Anxiety   Chronic pain syndrome    Assessment and Plan:   Recurrent UTI/cystitis/pyelonephritis -she was hospitalized from 11/16 to 11/21 for ecoli  ( resistent to cipro, augmentin, bactrim) pyelonephritis and bacteremia, she was treated with total 7 days of antibiotics -present with fever and confusion, reports left flank pain at home -CT of abdomen and pelvis was suggestive of cystitis/pyelitis with 10 mm nonobstructing calculus within the left lower polar infundibulum. -urine and blood culture no growth -continue current abx, will discuss with urology   Lobar pneumonia? CT showed progressive consolidation of LLL  Will check procalcitonin Incentive spirometer,  get speech eval Continue current abx  Acute metabolic encephalopathy CT head no acute findings Not oriented to time today  AKI on CKDIIIa Cr fluctuating, appear worse today, with poor oral intake, will start hydration We will get  renal ultrasound and bladder scan Per chart review she had urinary retention during her last hospitalization  Chronic pain syndrome On opioids will be temporarily held due to acute metabolic encephalopathy  Type 2 diabetes mellitus , uncontrolled with hyperglycemia hemoglobin A1c on 04/18/2022 was 7.5 Continue Semglee 6 units and adjust dose accordingly Continue ISS and hypoglycemia protocol Does not appear to be on insulin at home  HTN  Bp stable on current regimen  Sacral decubitus ulcer 3, presents on admission Documented during last hospitalization as well Continue wound care Pressure offloading   Hypoalbuminemia possibly secondary to mild protein calorie malnutrition Protein supplement will be provided Reports unintentional weight loss  I have Reviewed nursing notes, Vitals, pain scores, I/o's, Lab results and  imaging results since pt's last encounter, details please see discussion above  I ordered the following labs:  Unresulted Labs (From admission, onward)     Start     Ordered   05/06/22 4481  Basic metabolic panel  Tomorrow morning,   R       Question:  Specimen collection method  Answer:  Lab=Lab collect   05/05/22 1735   05/06/22 0500  Magnesium  Tomorrow morning,   R       Question:  Specimen collection method  Answer:  Lab=Lab collect   05/05/22 1735             DVT prophylaxis: enoxaparin (LOVENOX) injection 40 mg Start: 05/04/22 1000 SCDs Start: 05/04/22 0343   Code Status:   Code Status: Full Code  Family Communication: None at bedside Disposition:   Dispo: The patient is from: home  Anticipated d/c is to: Home with home health              Anticipated d/c date is: 24-48hrs pending clinical improvement, need to talk to urology  Antimicrobials:   Anti-infectives (From admission, onward)    Start     Dose/Rate Route Frequency Ordered Stop   05/04/22 1000  cefTRIAXone (ROCEPHIN) 1 g in sodium chloride 0.9 % 100 mL IVPB        1  g 200 mL/hr over 30 Minutes Intravenous Every 24 hours 05/04/22 0325     05/03/22 2345  azithromycin (ZITHROMAX) 500 mg in sodium chloride 0.9 % 250 mL IVPB        500 mg 250 mL/hr over 60 Minutes Intravenous  Once 05/03/22 2330 05/04/22 0342   05/03/22 1845  cefTRIAXone (ROCEPHIN) 1 g in sodium chloride 0.9 % 100 mL IVPB        1 g 200 mL/hr over 30 Minutes Intravenous  Once 05/03/22 1841 05/03/22 1955           Objective: Vitals:   05/04/22 2053 05/04/22 2221 05/05/22 0449 05/05/22 1256  BP: (!) 145/85 (!) 149/82 127/61 (!) 122/52  Pulse: 83 75 73 81  Resp: '20 19 19 19  '$ Temp: 98.6 F (37 C) 98 F (36.7 C) 98 F (36.7 C) 98.9 F (37.2 C)  TempSrc: Oral Oral    SpO2: 99% 99% 97% 98%  Weight:      Height:        Intake/Output Summary (Last 24 hours) at 05/05/2022 1739 Last data filed at 05/05/2022 1500 Gross per 24 hour  Intake 417.74 ml  Output --  Net 417.74 ml   Filed Weights   05/03/22 1705  Weight: 58.1 kg    Examination:  General exam: Frail, weak, not oriented to the year, poor historian Respiratory system: diminish at bases , no wheezing, no rales no rhonchi ,respiratory effort normal. Cardiovascular system:  RRR.  Gastrointestinal system: Abdomen is nondistended, soft and nontender.  Normal bowel sounds heard. Central nervous system: Confused about a year, no focal neurological deficits. Extremities:  no edema Skin: Sacral decubitus ulcer present on admission,  Psychiatry: Slightly confused, no agitation   Data Reviewed: I have personally reviewed  labs and visualized  imaging studies since the last encounter and formulate the plan        Scheduled Meds:  amLODipine  10 mg Oral Daily   atorvastatin  20 mg Oral Daily   enoxaparin (LOVENOX) injection  40 mg Subcutaneous Q24H   feeding supplement (GLUCERNA SHAKE)  237 mL Oral TID BM   ferrous sulfate  325 mg Oral QODAY   hydrALAZINE  100 mg Oral BID   insulin aspart  0-5 Units Subcutaneous QHS    insulin aspart  0-9 Units Subcutaneous TID WC   insulin glargine-yfgn  6 Units Subcutaneous QHS   isosorbide mononitrate  30 mg Oral Daily   levothyroxine  25 mcg Oral Daily   metoprolol succinate  100 mg Oral Daily   Continuous Infusions:  sodium chloride 75 mL/hr at 05/05/22 1413   cefTRIAXone (ROCEPHIN)  IV 1 g (05/05/22 0852)     LOS: 2 days     Florencia Reasons, MD PhD FACP [facility]  Available via Epic secure chat 7am-7pm for nonurgent issues Please page for urgent issues To page the attending provider between 7A-7P or the covering provider during after hours 7P-7A, please log into the web site [email or web address] and access using universal [facility] password for that web site. If you do not have the password, please call the hospital operator.    05/05/2022, 5:39 PM

## 2022-05-06 DIAGNOSIS — E119 Type 2 diabetes mellitus without complications: Secondary | ICD-10-CM | POA: Diagnosis not present

## 2022-05-06 DIAGNOSIS — N1330 Unspecified hydronephrosis: Secondary | ICD-10-CM

## 2022-05-06 DIAGNOSIS — N179 Acute kidney failure, unspecified: Secondary | ICD-10-CM | POA: Diagnosis not present

## 2022-05-06 DIAGNOSIS — N200 Calculus of kidney: Secondary | ICD-10-CM

## 2022-05-06 DIAGNOSIS — N390 Urinary tract infection, site not specified: Secondary | ICD-10-CM | POA: Diagnosis not present

## 2022-05-06 DIAGNOSIS — G9341 Metabolic encephalopathy: Secondary | ICD-10-CM | POA: Diagnosis not present

## 2022-05-06 DIAGNOSIS — N12 Tubulo-interstitial nephritis, not specified as acute or chronic: Secondary | ICD-10-CM

## 2022-05-06 LAB — MAGNESIUM: Magnesium: 2.3 mg/dL (ref 1.7–2.4)

## 2022-05-06 LAB — BASIC METABOLIC PANEL
Anion gap: 9 (ref 5–15)
BUN: 26 mg/dL — ABNORMAL HIGH (ref 8–23)
CO2: 19 mmol/L — ABNORMAL LOW (ref 22–32)
Calcium: 8.5 mg/dL — ABNORMAL LOW (ref 8.9–10.3)
Chloride: 110 mmol/L (ref 98–111)
Creatinine, Ser: 1.46 mg/dL — ABNORMAL HIGH (ref 0.44–1.00)
GFR, Estimated: 41 mL/min — ABNORMAL LOW (ref >60)
Glucose, Bld: 171 mg/dL — ABNORMAL HIGH (ref 70–99)
Potassium: 4.2 mmol/L (ref 3.5–5.1)
Sodium: 138 mmol/L (ref 135–145)

## 2022-05-06 LAB — GLUCOSE, CAPILLARY
Glucose-Capillary: 157 mg/dL — ABNORMAL HIGH (ref 70–99)
Glucose-Capillary: 189 mg/dL — ABNORMAL HIGH (ref 70–99)
Glucose-Capillary: 213 mg/dL — ABNORMAL HIGH (ref 70–99)
Glucose-Capillary: 149 mg/dL — ABNORMAL HIGH (ref 70–99)

## 2022-05-06 LAB — PROCALCITONIN: Procalcitonin: <0.1 ng/mL

## 2022-05-06 MED ORDER — SODIUM CHLORIDE 0.9 % IV SOLN: INTRAVENOUS | Status: AC

## 2022-05-06 MED ORDER — THIAMINE MONONITRATE 100 MG PO TABS
100.0000 mg | ORAL_TABLET | Freq: Every day | ORAL | Status: DC
  Administered 2022-05-06 – 2022-05-07 (×2): 100 mg via ORAL
  Filled 2022-05-06 (×2): qty 1

## 2022-05-06 MED ORDER — ADULT MULTIVITAMIN W/MINERALS CH
1.0000 | ORAL_TABLET | Freq: Every day | ORAL | Status: DC
  Administered 2022-05-06 – 2022-05-07 (×2): 1 via ORAL
  Filled 2022-05-06 (×2): qty 1

## 2022-05-06 MED ORDER — VITAMIN B-12 1000 MCG PO TABS
1000.0000 ug | ORAL_TABLET | Freq: Every day | ORAL | Status: DC
  Administered 2022-05-06: 1000 ug via ORAL
  Filled 2022-05-06 (×2): qty 1

## 2022-05-06 NOTE — Progress Notes (Signed)
Initial Nutrition Assessment  DOCUMENTATION CODES:  Not applicable  INTERVENTION:  Liberalize diet to carb modified to promote good PO intake and change to automatic trays Discontinue glucerna as pt does not like them MVI with  minerals daily  NUTRITION DIAGNOSIS:  Inadequate oral intake related to poor appetite, lethargy/confusion as evidenced by meal completion < 50%.  GOAL:  Patient will meet greater than or equal to 90% of their needs  MONITOR:  PO intake, Supplement acceptance, Labs  REASON FOR ASSESSMENT:  Malnutrition Screening Tool    ASSESSMENT:   Pt with hx of HTN, HLD, DM type 2, constipation, CAD, fatty liver, and hx of CVA presented to ED with confusion and fever after a recent admission for pyelonephritis with bacteremia. Pt did not complete antibiotic course at home and in ED found to have recurrent UTI.  Unable to obtain nutrition hx from pt at this time due to confusion. Noted that pt was recently admitted at [facility] and was evaluated by RD and dx with malnutrition. Pt reported food insecurity at home at that time which was the cause of her weight loss. Noted that pt has been refusing nutrition supplements this admission and also declined them during last admission. Will liberalize diet to allow for more menu options.  Will also reach out to CM to provide resources for food once  pt discharges.   Average Meal Intake: 12/5-12/7: 41% intake x 5 recorded meals  Nutritionally Relevant Medications: Scheduled Meds:  atorvastatin  20 mg Oral Daily   vitamin B-12  1,000 mcg Oral Daily   feeding supplement (GLUCERNA SHAKE)  237 mL Oral TID BM   ferrous sulfate  325 mg Oral QODAY   insulin aspart  0-5 Units Subcutaneous QHS   insulin aspart  0-9 Units Subcutaneous TID WC   insulin glargine-yfgn  6 Units Subcutaneous QHS   thiamine  100 mg Oral Daily   Continuous Infusions:  sodium chloride 75 mL/hr at 05/06/22 1245   cefTRIAXone (ROCEPHIN)  IV 1 g (05/06/22  0824)   PRN Meds: ondansetron  Labs Reviewed: BUN 26, creatinine 1.46 CBG ranges from 157-243 mg/dL over the last 24 hours HgbA1c 7.5% (11/19)  NUTRITION - FOCUSED PHYSICAL EXAM: Defer to in-person assessment  Diet Order:   Diet Order             Diet heart healthy/carb modified Room service appropriate? Yes; Fluid consistency: Thin  Diet effective now                   EDUCATION NEEDS:   Not appropriate for education at this time  Skin:  Skin Integrity Issues:: Stage III Stage III: sacrum  Last BM:  12/6  Height:   Ht Readings from Last 1 Encounters:  05/03/22 '5\' 5"'$  (1.651 m)    Weight:   Wt Readings from Last 1 Encounters:  05/03/22 58.1 kg    Ideal Body Weight:  56.8 kg  BMI:  Body mass index is 21.3 kg/m.  Estimated Nutritional Needs:  Kcal:  1500-1700 kcal/d Protein:  70-90g/d Fluid:  >/=1.5L/d    Ranell Patrick, RD, LDN Clinical Dietitian RD pager # available in [facility]  After hours/weekend pager # available in [facility]

## 2022-05-06 NOTE — Progress Notes (Signed)
PROGRESS NOTE    Anita [name]  HDQ:[id number] DOB: [birth year] DOA: 05/03/2022 PCP: Redmond School, MD     Brief Narrative:   H/o HTN, HLD, NIDDM2, lacunar infarct, possible afib vs nonsustained SVT on 11/18 during recent hospitalization from 11/16 to 11/21, presents with fever and confusion  Subjective:  She appears to be more alert and stronger, but still not able to remember the months and the year, not remember she was hospitalized three weeks ago Not sure her baseline memory, will talk to family  She is not a reliable historian, reports intermittent left flank pain at home , today denies pain  No fever, she has poor appetite , appear less weak and less confused today, no n/v, denies bowel and bladder issues  Assessment & Plan:  Principal Problem:   UTI (urinary tract infection) Active Problems:   Acute metabolic encephalopathy   Essential hypertension   Uncontrolled type 2 diabetes mellitus with hyperglycemia, without long-term current use of insulin (HCC)   Hypoalbuminemia due to protein-calorie malnutrition (HCC)   Iron deficiency anemia   Sacral decubitus ulcer   Chronic kidney disease, stage 3b ([place])   Mixed hyperlipidemia   Acquired hypothyroidism   Anxiety   Chronic pain syndrome    Assessment and Plan:   Recurrent UTI/cystitis/pyelonephritis -she was hospitalized from 11/16 to 11/21 for ecoli  ( resistent to cipro, augmentin, bactrim) pyelonephritis and bacteremia, she was treated with total 7 days of antibiotics -present with fever and confusion, reports left flank pain at home -CT of abdomen and pelvis was suggestive of cystitis/pyelitis with 10 mm nonobstructing calculus within the left lower polar infundibulum. -urine and blood culture no growth -continue current abx, urology will see patient in consult   Lobar pneumonia? CT showed progressive consolidation of LLL  Procalcitonin less tahn 0.1 Incentive spirometer,  get speech eval Continue current  abx Pcp to repeat cxr in 3-4 weeks  Acute metabolic encephalopathy CT head no acute findings Appear more alert , but Not oriented to time today, will check with family about her baseline B12 lower normal, will start b12 and thiamine supplement  Chronic pain syndrome On opioids will be temporarily held due to acute metabolic encephalopathy She denies pain today  AKI on CKDIIIa -Per chart review she had urinary retention during her last hospitalization -renal ultrasound this time showed "Normal size and ultrasound appearance of the kidneys and bladder. No hydronephrosis." - PVR 44 by bladder scan on 12/7 -Cr fluctuating,  with poor oral intake, will continue hydration, repeat bmp in am   Type 2 diabetes mellitus , uncontrolled with hyperglycemia hemoglobin A1c on 04/18/2022 was 7.5 Continue Semglee 6 units and adjust dose accordingly Continue ISS and hypoglycemia protocol Does not appear to be on insulin at home  HTN  Bp stable on current regimen  Sacral decubitus ulcer 3, presents on admission Documented during last hospitalization as well Continue wound care Pressure offloading   Hypoalbuminemia possibly secondary to mild protein calorie malnutrition Protein supplement will be provided Reports unintentional weight loss  I have Reviewed nursing notes, Vitals, pain scores, I/o's, Lab results and  imaging results since pt's last encounter, details please see discussion above  I ordered the following labs:  Unresulted Labs (From admission, onward)     Start     Ordered   05/06/22 0500  Procalcitonin  Daily,   R     Question:  Specimen collection method  Answer:  Lab=Lab collect   05/05/22 1751  DVT prophylaxis: enoxaparin (LOVENOX) injection 40 mg Start: 05/04/22 1000 SCDs Start: 05/04/22 0343   Code Status:   Code Status: Full Code  Family Communication: None at bedside, will call daughter with permission  Disposition:   Dispo: The patient is  from: home              Anticipated d/c is to: Home with home health              Anticipated d/c date is: 24-48hrs pending clinical improvement, urology consult   Antimicrobials:   Anti-infectives (From admission, onward)    Start     Dose/Rate Route Frequency Ordered Stop   05/04/22 1000  cefTRIAXone (ROCEPHIN) 1 g in sodium chloride 0.9 % 100 mL IVPB        1 g 200 mL/hr over 30 Minutes Intravenous Every 24 hours 05/04/22 0325     05/03/22 2345  azithromycin (ZITHROMAX) 500 mg in sodium chloride 0.9 % 250 mL IVPB        500 mg 250 mL/hr over 60 Minutes Intravenous  Once 05/03/22 2330 05/04/22 0342   05/03/22 1845  cefTRIAXone (ROCEPHIN) 1 g in sodium chloride 0.9 % 100 mL IVPB        1 g 200 mL/hr over 30 Minutes Intravenous  Once 05/03/22 1841 05/03/22 1955           Objective: Vitals:   05/05/22 0449 05/05/22 1256 05/05/22 2100 05/06/22 0545  BP: 127/61 (!) 122/52 (!) 145/62 (!) 157/66  Pulse: 73 81 70 100  Resp: '19 19 20 16  '$ Temp: 98 F (36.7 C) 98.9 F (37.2 C) 98.4 F (36.9 C) 98.3 F (36.8 C)  TempSrc:   Oral Oral  SpO2: 97% 98% 99% 95%  Weight:      Height:        Intake/Output Summary (Last 24 hours) at 05/06/2022 1145 Last data filed at 05/06/2022 0900 Gross per 24 hour  Intake 777.74 ml  Output 3 ml  Net 774.74 ml   Filed Weights   05/03/22 1705  Weight: 58.1 kg    Examination:  General exam: Frail, weak, not oriented to the year, poor historian Respiratory system: diminish at bases , no wheezing, no rales no rhonchi ,respiratory effort normal. Cardiovascular system:  RRR.  Gastrointestinal system: Abdomen is nondistended, soft and nontender.  Normal bowel sounds heard. Central nervous system: Confused about a year, no focal neurological deficits. Extremities:  no edema Skin: Sacral decubitus ulcer present on admission, dry skin in general  Psychiatry: Slightly confused, no agitation   Data Reviewed: I have personally reviewed  labs and  visualized  imaging studies since the last encounter and formulate the plan        Scheduled Meds:  amLODipine  10 mg Oral Daily   atorvastatin  20 mg Oral Daily   vitamin B-12  1,000 mcg Oral Daily   enoxaparin (LOVENOX) injection  40 mg Subcutaneous Q24H   feeding supplement (GLUCERNA SHAKE)  237 mL Oral TID BM   ferrous sulfate  325 mg Oral QODAY   hydrALAZINE  100 mg Oral BID   insulin aspart  0-5 Units Subcutaneous QHS   insulin aspart  0-9 Units Subcutaneous TID WC   insulin glargine-yfgn  6 Units Subcutaneous QHS   isosorbide mononitrate  30 mg Oral Daily   levothyroxine  25 mcg Oral Daily   metoprolol succinate  100 mg Oral Daily   thiamine  100 mg Oral Daily   Continuous Infusions:  sodium chloride 75 mL/hr at 05/06/22 0356   cefTRIAXone (ROCEPHIN)  IV 1 g (05/06/22 0824)     LOS: 3 days     Florencia Reasons, MD PhD FACP [facility]  Available via Epic secure chat 7am-7pm for nonurgent issues Please page for urgent issues To page the attending provider between 7A-7P or the covering provider during after hours 7P-7A, please log into the web site [email or web address] and access using universal [place] password for that web site. If you do not have the password, please call the hospital operator.    05/06/2022, 11:45 AM

## 2022-05-06 NOTE — Progress Notes (Signed)
PROGRESS NOTE    Anita [name]  ZOX:[id number] DOB: 02-22-1961 DOA: 05/03/2022 PCP: Redmond School, MD     Brief Narrative:   H/o HTN, HLD, NIDDM2, lacunar infarct, possible afib vs nonsustained SVT on 11/18 during recent hospitalization from 11/16 to 11/21, presents with fever and confusion  Subjective:  She appears to be more alert and stronger, but still not able to remember the months and the year, not remember she was hospitalized three weeks ago Not sure her baseline memory, will talk to family  She is not a reliable historian, reports intermittent left flank pain at home , today denies pain  No fever, she has poor appetite , appear less weak and less confused today, no n/v, denies bowel and bladder issues  Assessment & Plan:  Principal Problem:   UTI (urinary tract infection) Active Problems:   Acute metabolic encephalopathy   Essential hypertension   Uncontrolled type 2 diabetes mellitus with hyperglycemia, without long-term current use of insulin (HCC)   Hypoalbuminemia due to protein-calorie malnutrition (HCC)   Iron deficiency anemia   Sacral decubitus ulcer   Chronic kidney disease, stage 3b ([place])   Mixed hyperlipidemia   Acquired hypothyroidism   Anxiety   Chronic pain syndrome    Assessment and Plan:   Recurrent UTI/cystitis/pyelonephritis -she was hospitalized from 11/16 to 11/21 for ecoli  ( resistent to cipro, augmentin, bactrim) pyelonephritis and bacteremia, she was treated with total 7 days of antibiotics -present with fever and confusion, reports left flank pain at home -CT of abdomen and pelvis was suggestive of cystitis/pyelitis with 10 mm nonobstructing calculus within the left lower polar infundibulum. -urine and blood culture no growth -continue current abx, urology will see patient in consult   Lobar pneumonia? CT showed progressive consolidation of LLL  Procalcitonin less tahn 0.1 Incentive spirometer,  get speech eval Continue current  abx Pcp to repeat cxr in 3-4 weeks  Acute metabolic encephalopathy CT head no acute findings Appear more alert , but Not oriented to time today,  Daughter reports she has been having progressive memory issues for at least two and half a year B12 lower normal, will start b12 and thiamine supplement Recommend f/u with neurology  Chronic pain syndrome On opioids will be temporarily held due to acute metabolic encephalopathy She denies pain today  AKI on CKDIIIa -Per chart review she had urinary retention during her last hospitalization -renal ultrasound this time showed "Normal size and ultrasound appearance of the kidneys and bladder. No hydronephrosis." - PVR 44 by bladder scan on 12/7 -Cr fluctuating,  with poor oral intake, will continue hydration, repeat bmp in am   Type 2 diabetes mellitus , uncontrolled with hyperglycemia hemoglobin A1c on 04/18/2022 was 7.5 Continue Semglee 6 units and adjust dose accordingly Continue ISS and hypoglycemia protocol Does not appear to be on insulin at home  HTN  Bp stable on current regimen  Sacral decubitus ulcer 3, presents on admission Documented during last hospitalization as well Continue wound care Pressure offloading   Hypoalbuminemia possibly secondary to mild protein calorie malnutrition Protein supplement will be provided Reports unintentional weight loss  FTT She stopped driving many years ago due to chronic headache and on chronic pain meds, most activity is walking in very slow pace, has a walker but does not use walker , she falls often at baseline. She lives with her husband who just had partial amputation and walking with crutches   I have Reviewed nursing notes, Vitals, pain scores, I/o's, Lab  results and  imaging results since pt's last encounter, details please see discussion above  I ordered the following labs:  Unresulted Labs (From admission, onward)     Start     Ordered   05/07/22 6387  Basic metabolic panel   Tomorrow morning,   R       Question:  Specimen collection method  Answer:  Lab=Lab collect   05/06/22 1151   05/06/22 0500  Procalcitonin  Daily,   R     Question:  Specimen collection method  Answer:  Lab=Lab collect   05/05/22 1751             DVT prophylaxis: enoxaparin (LOVENOX) injection 40 mg Start: 05/04/22 1000 SCDs Start: 05/04/22 0343   Code Status:   Code Status: Full Code  Family Communication: daughter at bedside with permission  Disposition:   Dispo: The patient is from: home              Anticipated d/c is to: Home with home health, HH/f54forder placed              Anticipated d/c date is: 24-48hrs pending clinical improvement, urology consult   Antimicrobials:   Anti-infectives (From admission, onward)    Start     Dose/Rate Route Frequency Ordered Stop   05/04/22 1000  cefTRIAXone (ROCEPHIN) 1 g in sodium chloride 0.9 % 100 mL IVPB        1 g 200 mL/hr over 30 Minutes Intravenous Every 24 hours 05/04/22 0325     05/03/22 2345  azithromycin (ZITHROMAX) 500 mg in sodium chloride 0.9 % 250 mL IVPB        500 mg 250 mL/hr over 60 Minutes Intravenous  Once 05/03/22 2330 05/04/22 0342   05/03/22 1845  cefTRIAXone (ROCEPHIN) 1 g in sodium chloride 0.9 % 100 mL IVPB        1 g 200 mL/hr over 30 Minutes Intravenous  Once 05/03/22 1841 05/03/22 1955           Objective: Vitals:   05/05/22 1256 05/05/22 2100 05/06/22 0545 05/06/22 1254  BP: (!) 122/52 (!) 145/62 (!) 157/66 (!) 152/61  Pulse: 81 70 100 98  Resp: '19 20 16 19  '$ Temp: 98.9 F (37.2 C) 98.4 F (36.9 C) 98.3 F (36.8 C) 99.1 F (37.3 C)  TempSrc:  Oral Oral Oral  SpO2: 98% 99% 95% 97%  Weight:      Height:        Intake/Output Summary (Last 24 hours) at 05/06/2022 1344 Last data filed at 05/06/2022 0900 Gross per 24 hour  Intake 537.74 ml  Output 3 ml  Net 534.74 ml   Filed Weights   05/03/22 1705  Weight: 58.1 kg    Examination:  General exam: Frail, weak, not oriented to  the year, poor historian Respiratory system: diminish at bases , no wheezing, no rales no rhonchi ,respiratory effort normal. Cardiovascular system:  RRR.  Gastrointestinal system: Abdomen is nondistended, soft and nontender.  Normal bowel sounds heard. Central nervous system: Confused about a year, no focal neurological deficits. Extremities:  no edema Skin: Sacral decubitus ulcer present on admission, dry skin in general  Psychiatry: Slightly confused, no agitation   Data Reviewed: I have personally reviewed  labs and visualized  imaging studies since the last encounter and formulate the plan        Scheduled Meds:  amLODipine  10 mg Oral Daily   atorvastatin  20 mg Oral Daily  vitamin B-12  1,000 mcg Oral Daily   enoxaparin (LOVENOX) injection  40 mg Subcutaneous Q24H   feeding supplement (GLUCERNA SHAKE)  237 mL Oral TID BM   ferrous sulfate  325 mg Oral QODAY   hydrALAZINE  100 mg Oral BID   insulin aspart  0-5 Units Subcutaneous QHS   insulin aspart  0-9 Units Subcutaneous TID WC   insulin glargine-yfgn  6 Units Subcutaneous QHS   isosorbide mononitrate  30 mg Oral Daily   levothyroxine  25 mcg Oral Daily   metoprolol succinate  100 mg Oral Daily   thiamine  100 mg Oral Daily   Continuous Infusions:  sodium chloride 75 mL/hr at 05/06/22 1245   cefTRIAXone (ROCEPHIN)  IV 1 g (05/06/22 0824)     LOS: 3 days     Florencia Reasons, MD PhD FACP [facility]  Available via Epic secure chat 7am-7pm for nonurgent issues Please page for urgent issues To page the attending provider between 7A-7P or the covering provider during after hours 7P-7A, please log into the web site [email or web address] and access using universal [place] password for that web site. If you do not have the password, please call the hospital operator.    05/06/2022, 1:44 PM

## 2022-05-06 NOTE — Progress Notes (Signed)
Patient remains independent in room. Appeared restless but easily reoriented to lay down. No complaints of nausea or vomiting.

## 2022-05-06 NOTE — Consult Note (Signed)
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 silent renal damage, life-threatening infection, need for emergent surgery, failure to pass stone and pain.   For ureteroscopy I described the risks which include bleeding, infection, damage to contiguous structures, positioning injury, ureteral stricture, ureteral avulsion, ureteral injury, need for prolonged ureteral stent, inability to perform ureteroscopy, need  for an interval procedure, inability to clear stone burden, stent  discomfort/pain, heart attack, stroke, pulmonary embolus and the inherent risks with general anesthesia.   For shockwave lithotripsy I described the risks which include arrhythmia, kidney contusion, kidney hemorrhage, need for transfusion, pain, inability to adequately break up stone, inability to pass stone fragments, Steinstrasse, infection associated with obstructing stones, need for alternate surgical procedure, need for repeat shockwave lithotripsy, MI, CVA, PE and the inherent risks with anesthesia/conscious sedation.   For PCNL I described the risks including positioning injury, pneumothorax, hydrothorax, need for chest tube, inability to clear stone burden, renal laceration, arterial venous fistula or malformation, need for embolization of kidney, loss of kidney or renal function, need for repeat procedure, need for prolonged nephrostomy tube, ureteral avulsion, MI, CVA, PE and the inherent risks of general anesthesia.   - The patient would like to proceed with observation. She will followup in 2 weeks after discharge with a KUB  Nicolette Bang 05/06/2022, 7:04 PM

## 2022-05-06 NOTE — Discharge Instructions (Signed)
Agency Name: [place] [facility] Address: [address] [place], [place] [address] Phone: [phone] Website: [email or web address] Services Offered: Meals on [employer] and Meals with Friends.  Home care, at home assisted living, [place]  for [place], transportation   Agency Name: [place] Address: Sites vary. Must call first. Food Pantry location: [address], [place], [place] [address] [employer] Phone: [phone] Website: none Services Offered: Building services engineer, utility assistance if funds available Scientist, forensic for all of [place], [place], [place] for [facility] area only) Walk-in  current Id and current address verification required.  Wed-Thurs: 9:30-12:00  Agency Name: [facility] Address: [address], [place], [place] [address]  Phone: [phone] or [phone] Website: none Services Offered: Food assistance  Agency Name: Sandrea Matte Address: [address], [place], [place] [address]  Phone: [phone] or [phone]  Website: none Services Offered: Serves 1 hot meal a day at 11:00 am Monday-Sunday and 5 pm  on the second and fourth Sunday of each month May 06, 2020 6  Agency Name: [place] [facility] and [facility]  Services/Social Services  Address: [phone], [place], [place] [address] Phone: [phone] Website: [email or web address]  [email or web address] Services Offered: Physicist, medical, [facility] program  Agency Name: [facility] Address: [address] [place],  [address] Phone: [phone] Website: [email or web address] Services Offered: Food pantry Tuesday, Wednesday and Thursday 9am-11:30am  (need appointment) and health clinic (9:00am-11:00am)  Agency Name: [employer] [facility] Address: [address] [place] / [address]  [place] Phone: [phone] [place] / [phone] [place] Website: [email or web address] Services Offered: Games developer, food pantry, soup kitchen ([place]) emergency financial  assistance, thrift stores, showers & hygiene products ([place]),  [place], spiritual help
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
## 2022-05-07 DIAGNOSIS — R627 Adult failure to thrive: Secondary | ICD-10-CM

## 2022-05-07 DIAGNOSIS — E1165 Type 2 diabetes mellitus with hyperglycemia: Secondary | ICD-10-CM | POA: Diagnosis not present

## 2022-05-07 DIAGNOSIS — R413 Other amnesia: Secondary | ICD-10-CM | POA: Diagnosis not present

## 2022-05-07 DIAGNOSIS — N179 Acute kidney failure, unspecified: Secondary | ICD-10-CM | POA: Diagnosis not present

## 2022-05-07 DIAGNOSIS — N390 Urinary tract infection, site not specified: Secondary | ICD-10-CM | POA: Diagnosis not present

## 2022-05-07 LAB — BASIC METABOLIC PANEL
Anion gap: 14 (ref 5–15)
BUN: 21 mg/dL (ref 8–23)
CO2: 17 mmol/L — ABNORMAL LOW (ref 22–32)
Calcium: 8.9 mg/dL (ref 8.9–10.3)
Chloride: 104 mmol/L (ref 98–111)
Creatinine, Ser: 1.15 mg/dL — ABNORMAL HIGH (ref 0.44–1.00)
GFR, Estimated: 54 mL/min — ABNORMAL LOW (ref >60)
Glucose, Bld: 202 mg/dL — ABNORMAL HIGH (ref 70–99)
Potassium: 3.7 mmol/L (ref 3.5–5.1)
Sodium: 135 mmol/L (ref 135–145)

## 2022-05-07 LAB — PROCALCITONIN: Procalcitonin: <0.1 ng/mL

## 2022-05-07 LAB — GLUCOSE, CAPILLARY
Glucose-Capillary: 203 mg/dL — ABNORMAL HIGH (ref 70–99)
Glucose-Capillary: 172 mg/dL — ABNORMAL HIGH (ref 70–99)

## 2022-05-07 LAB — TROPONIN I (HIGH SENSITIVITY): Troponin I (High Sensitivity): 12 ng/L (ref <18)

## 2022-05-07 MED ORDER — ASPIRIN 81 MG PO TBEC
81.0000 mg | DELAYED_RELEASE_TABLET | Freq: Every day | ORAL | Status: DC
  Administered 2022-05-07: 81 mg via ORAL
  Filled 2022-05-07: qty 1

## 2022-05-07 MED ORDER — ISOSORBIDE MONONITRATE ER 30 MG PO TB24: 60.0000 mg | ORAL_TABLET | Freq: Every day | ORAL | 0 refills | Status: DC

## 2022-05-07 MED ORDER — VITAMIN C 500 MG PO TABS
500.0000 mg | ORAL_TABLET | Freq: Every day | ORAL | Status: DC
  Administered 2022-05-07: 500 mg via ORAL
  Filled 2022-05-07: qty 1

## 2022-05-07 MED ORDER — VITAMIN B-12 1000 MCG PO TABS
1000.0000 ug | ORAL_TABLET | Freq: Every day | ORAL | Status: DC
  Administered 2022-05-07: 1000 ug via ORAL

## 2022-05-07 MED ORDER — NITROGLYCERIN 0.4 MG SL SUBL
0.4000 mg | SUBLINGUAL_TABLET | SUBLINGUAL | Status: DC | PRN
  Administered 2022-05-07: 0.4 mg via SUBLINGUAL
  Filled 2022-05-07: qty 1

## 2022-05-07 MED ORDER — INSULIN LISPRO (1 UNIT DIAL) 100 UNIT/ML (KWIKPEN): PEN_INJECTOR | SUBCUTANEOUS | 0 refills | Status: DC

## 2022-05-07 MED ORDER — HYDRALAZINE HCL 50 MG PO TABS: 100.0000 mg | ORAL_TABLET | Freq: Three times a day (TID) | ORAL | 0 refills | Status: DC

## 2022-05-07 MED ORDER — CEFADROXIL 500 MG PO CAPS
500.0000 mg | ORAL_CAPSULE | Freq: Two times a day (BID) | ORAL | Status: DC
  Administered 2022-05-07: 500 mg via ORAL
  Filled 2022-05-07 (×7): qty 1

## 2022-05-07 MED ORDER — ISOSORBIDE MONONITRATE ER 60 MG PO TB24
60.0000 mg | ORAL_TABLET | Freq: Every day | ORAL | Status: DC
  Administered 2022-05-07: 60 mg via ORAL

## 2022-05-07 MED ORDER — CEFADROXIL 500 MG PO CAPS: 500.0000 mg | ORAL_CAPSULE | Freq: Two times a day (BID) | ORAL | 0 refills | Status: AC

## 2022-05-07 MED ORDER — VITAMIN D 25 MCG (1000 UNIT) PO TABS
5000.0000 [IU] | ORAL_TABLET | Freq: Every day | ORAL | Status: DC
  Administered 2022-05-07: 5000 [IU] via ORAL
  Filled 2022-05-07: qty 5

## 2022-05-07 NOTE — TOC Transition Note (Signed)
Transition of Care [facility]) - CM/SW Discharge Note   Patient Details  Name: Anita [name] MRN: [id number] Date of Birth: February 21, 1961  [facility] CM/SW Contact:  Iona Beard, [place] Phone Number: 05/07/2022, 10:46 AM   Clinical Narrative:    CSW updated that pt is medically ready for D/C home. CSW updated Judson Roch with [facility] of plan for D/C today. HH orders have been placed. TOC signing off.   Final next level of care: [place] Barriers to Discharge: Barriers Resolved   Patient Goals and CMS Choice Patient states their goals for this hospitalization and ongoing recovery are:: return home CMS Medicare.gov Compare Post Acute Care list provided to:: Patient Choice offered to / list presented to : Patient  Discharge Placement                       Discharge Plan and Services                          HH Arranged: PT [facility] Agency: [place] Date [place]: 05/07/22   Representative spoke with at [place]: [place] Determinants of Health ([place]) Interventions Food Insecurity Interventions: Intervention Not Indicated Housing Interventions: Intervention Not Indicated Transportation Interventions: Intervention Not Indicated Utilities Interventions: Intervention Not Indicated   Readmission Risk Interventions    05/04/2022    4:10 PM  Readmission Risk Prevention Plan  Transportation Screening Complete  PCP or Specialist Appt within 3-5 Days Not Complete  HRI or [place] Complete  Social Work Consult for [place] Planning/Counseling Complete  Palliative Care Screening Not Applicable  Medication Review Press photographer) Complete

## 2022-05-07 NOTE — Progress Notes (Signed)
Nsg Discharge Note  Admit Date:  05/03/2022 Discharge date: 05/07/2022   [name] to be D/C'd Home per MD order.  AVS completed.  Patient/caregiver able to verbalize understanding.  Discharge Medication: Allergies as of 05/07/2022       Reactions   Ramipril    Possible ACE-I induced angioedema    Imitrex [sumatriptan] Swelling   States she had swelling in neck   Lyrica [pregabalin] Other (See Comments)   Severe Leg and feet swelling   Penicillins Rash        Medication List     STOP taking these medications    nicotine 21 mg/24hr patch Commonly known as: NICODERM CQ - dosed in mg/24 hours       TAKE these medications    acetaminophen 325 MG tablet Commonly known as: TYLENOL Take 2 tablets (650 mg total) by mouth every 6 (six) hours as needed for mild pain, headache or fever (or Fever >/= 101).   ALPRAZolam 1 MG tablet Commonly known as: XANAX Take 0.5 mg by mouth in the morning, at noon, in the evening, and at bedtime.   amLODipine 10 MG tablet Commonly known as: NORVASC Take 1 tablet (10 mg total) by mouth daily.   ascorbic acid 500 MG tablet Commonly known as: VITAMIN C Take 1 tablet (500 mg total) by mouth daily.   aspirin EC 81 MG tablet Take 1 tablet (81 mg total) by mouth daily with breakfast.   atorvastatin 20 MG tablet Commonly known as: LIPITOR Take 20 mg by mouth daily.   cefadroxil 500 MG capsule Commonly known as: DURICEF Take 1 capsule (500 mg total) by mouth 2 (two) times daily for 10 days.   cyanocobalamin 1000 MCG tablet Commonly known as: VITAMIN B12 Take 1 tablet (1,000 mcg total) by mouth daily.   EPINEPHrine 0.3 mg/0.3 mL Soaj injection Commonly known as: EPI-PEN Inject 0.3 mg into the muscle as needed for anaphylaxis. Inject as needed for anaphlaxis   gabapentin 600 MG tablet Commonly known as: NEURONTIN Take 600 mg by mouth 3 (three) times daily.   glipiZIDE 5 MG tablet Commonly known as: GLUCOTROL Take 1 tablet (5 mg  total) by mouth 2 (two) times daily before a meal. What changed: when to take this   hydrALAZINE 50 MG tablet Commonly known as: APRESOLINE Take 2 tablets (100 mg total) by mouth 3 (three) times daily. What changed:  how much to take when to take this   insulin lispro 100 UNIT/ML KwikPen Commonly known as: HumaLOG KwikPen Insulin sliding scale: Blood sugar  120-150   3units                       151-200   4units                       201-250   7units                       251- 300  11units                       301-350   15uints                       351-400   20units                       >400  call MD immediately   isosorbide mononitrate 30 MG 24 hr tablet Commonly known as: IMDUR Take 2 tablets (60 mg total) by mouth daily. What changed: how much to take   levothyroxine 25 MCG tablet Commonly known as: SYNTHROID Take 25 mcg by mouth daily.   metFORMIN 500 MG tablet Commonly known as: GLUCOPHAGE Take 500 mg by mouth 2 (two) times daily with a meal.   metoprolol tartrate 100 MG tablet Commonly known as: LOPRESSOR Take 100 mg by mouth 2 (two) times a day.   multivitamin with minerals Tabs tablet Take 1 tablet by mouth daily.   oxyCODONE 5 MG immediate release tablet Commonly known as: Oxy IR/ROXICODONE Take 10 mg by mouth every 6 (six) hours as needed for moderate pain (pain.).   polyethylene glycol powder 17 GM/SCOOP powder Commonly known as: GLYCOLAX/MIRALAX Take 17 g by mouth daily.   thiamine 100 MG tablet Commonly known as: VITAMIN B1 Take 1 tablet (100 mg total) by mouth daily.   tiZANidine 4 MG tablet Commonly known as: ZANAFLEX Take 1 tablet (4 mg total) by mouth at bedtime. What changed:  how much to take when to take this reasons to take this   Vitamin D-3 125 MCG (5000 UT) Tabs Take 5,000 Units by mouth daily.   zinc sulfate 220 (50 Zn) MG capsule Take 1 capsule (220 mg total) by mouth daily.        Discharge  Assessment: Vitals:   05/07/22 0605 05/07/22 0710  BP: (!) 146/103 (!) 167/71  Pulse: 88 99  Resp:  18  Temp:    SpO2: 99% 98%   Skin clean, dry and intact without evidence of skin break down, no evidence of skin tears noted. IV catheter discontinued intact. Site without signs and symptoms of complications - no redness or edema noted at insertion site, patient denies c/o pain - only slight tenderness at site.  Dressing with slight pressure applied.  D/c Instructions-Education: Discharge instructions given to patient/family with verbalized understanding. D/c education completed with patient/family including follow up instructions, medication list, d/c activities limitations if indicated, with other d/c instructions as indicated by MD - patient able to verbalize understanding, all questions fully answered. Patient instructed to return to ED, call 911, or call MD for any changes in condition.  Patient escorted via [place], and D/C home via private auto.  Kathie Rhodes, RN 05/07/2022 12:28 PM

## 2022-05-07 NOTE — Progress Notes (Addendum)
Patient slept on and off this shift. Frequency of urination. Labetalol '10mg'$  given once due to Blood pressure of 175/87. Followed up on blood pressure 151/6.  While finishing up morning rounds noticed patient sitting on side of bed, patient has a complaint of mid sternum chest pain. Blood pressure checked 146/103. Dr. Clearence Ped notified. New orders in chart. Nitroglycerin 0.4 given per Dr. Irene Pap. Vitals re taken BP 167/71, pulse 99, oxygen 98 on room air RR 18.  Patient still complaining chest pain. EKG in chart.

## 2022-05-07 NOTE — Consult Note (Signed)
   [facility] CM Inpatient Consult   05/07/2022  SHAILENE DEMONBREUN 1961-05-16 [id number]  [place] [facility] [ACO] Patient: [facility] Liaison remote coverage review for patient admitted to [place] Provider:  Redmond School, MD, [facility]   Patient screened for less than 30 days readmission hospitalization with noted medium risk score for unplanned readmission risk and to assess for potential [place] Management service needs for post hospital transition for care coordination.  Review of patient's electronic medical record reveals patient is for home with home health.   Plan:    Referral request for community care coordination: readmission prevention follow up  Of note, [facility] Care Management/Population Health does not replace or interfere with any arrangements made by the Inpatient Transition of Care team.  For questions contact:   Natividad Brood, RN BSN [place]  [phone] business mobile phone Toll free office [phone]  *[place]  [phone] Fax number: [phone] [email or web address]

## 2022-05-07 NOTE — Care Management Important Message (Signed)
Important Message  Patient Details  Name: Anita [name] MRN: [id number] Date of Birth: 04-28-1961   Medicare Important Message Given:  Yes     Tommy Medal 05/07/2022, 12:52 PM

## 2022-05-07 NOTE — Discharge Summary (Signed)
Discharge Summary  Anita [name] XHB:[id number] DOB: 1961/05/20  PCP: Redmond School, MD  Admit date: 05/03/2022 Discharge date: 05/07/2022  Time spent: 72mns, more than 50% time spent on coordination of care. Patient has poor memory, daughter is out of town, went over d/c instructions and meds with husband over the phone  Recommendations for Outpatient Follow-up:  F/u with PCP within a week  for hospital discharge follow up, repeat cbc/bmp at follow up, patient is advised to check blood pressure twice a day at home and bring record for PCP to review, further blood pressure medication adjustment per PCP. PCP to monitor blood glucose management as well PCP to repeat chest x-ray in 3 to 4 weeks to ensure resolution of left lower lobe infiltrate Follow-up with urology for kidney stone Follow-up with neurology for memory issues and chronic headache Home health order placed    Discharge Diagnoses:  Active Hospital Problems   Diagnosis Date Noted   UTI (urinary tract infection) 081/05/7508  Acute metabolic encephalopathy 025/85/2778   Priority: High   Essential hypertension 03/08/2012    Priority: Low   Uncontrolled type 2 diabetes mellitus with hyperglycemia, without long-term current use of insulin ([facility] 03/08/2012    Priority: Low   Hypoalbuminemia due to protein-calorie malnutrition ([facility] 05/04/2022   Iron deficiency anemia 05/04/2022   Sacral decubitus ulcer 05/04/2022   Chronic kidney disease, stage 3b ([facility] 05/04/2022   Mixed hyperlipidemia 05/04/2022   Acquired hypothyroidism 05/04/2022   Anxiety 05/04/2022   Chronic pain syndrome 05/04/2022    Resolved Hospital Problems  No resolved problems to display.    Discharge Condition: stable  Diet recommendation: heart healthy/carb modified  Filed Weights   05/03/22 1705  Weight: 58.1 kg    History of present illness: ( per admitting MD Dr AJosephine Cables  HPI: Anita SKLARis a 61y.o. female with medical history  significant of hypertension, hyperlipidemia, CKD stage IIIb, hypothyroidism, CAD, lacunar stroke and chronic pain who presents to the emergency department due to fever, confusion and suspicion for persistent/recurrent UTI with bacteremia.  Patient was unable to provide.history, possibly due to underlying confusion, history was obtained from ED physician and ED medical record. [facility] and bacteremia.  She was discharged home with home PT and [facility]  Her husband recently underwent partial foot amputation and she is a primary provider for the husband, so it was unsure if patient has been able to take care of herself since being discharged from the hospital.  Daughter saw patient today and noted that she felt warm to touch, she was confused and thought that patient's pyelonephritis has probably returned, so she took her to the ED for further evaluation and management.  She denies chest pain, shortness of breath, abdominal pain, painful urination, nausea or vomiting, she was unsure if she completed home antibiotics (cefadroxil).   ED Course:  In the emergency department, temperature was 100F, respiratory rate 16/min, pulse 102 bpm, BP 169/69, O2 sat 96% on room air.  Workup in the ED showed normocytic anemia, BMP was normal except for blood glucose of 263 and creatinine of 1.47 (creatinine is within baseline range), albumin 3.4, lactic acid 2.0 > 1.4, ammonia level was 10, urine drug screen was positive for benzos.  Influenza A, B, SARS-CoV-2 was negative. CT head without contrast showed no acute intracranial hemorrhage or infarct. CT  abdomen and pelvis with contrast showed: 1.  Progressive  consolidation within the visualized left lung base, in keeping with changes of acute infection or aspiration in the appropriate clinical setting. 2. 10 mm nonobstructing calculus  within the left lower polar infundibulum. 3. Asymmetric left urothelial enhancement involving the left pelvic calyceal system and left ureter as well as circumferential bladder wall hyperemia and mild perivesicular inflammatory stranding suggesting changes of infectious or inflammatory cystitis/pyelitis. Gas seen non dependently within the bladder lumen is nonspecific and can be seen the setting of instrumentation or a gas-forming infection. Correlation with urinalysis and urine culture may be helpful. 4. Grade 3 sacral decubitus ulcer. 5. Focal thickening, hypoenhancement, and mild perifascial inflammatory stranding involving the medial right gluteus maximus muscle, suggesting focal myositis/myonecrosis in this location. 6. Moderate colonic stool burden without evidence of obstruction. 7. Aortic atherosclerosis. Chest x-ray showed stable patchy airspace disease in the left lung base She was treated with IV ceftriaxone and azithromycin, Tylenol was given and IV hydration was provided. Hospitalist was asked admit patient for further evaluation and management.  Hospital Course:  Principal Problem:   UTI (urinary tract infection) Active Problems:   Acute metabolic encephalopathy   Essential hypertension   Uncontrolled type 2 diabetes mellitus with hyperglycemia, without long-term current use of insulin (HCC)   Hypoalbuminemia due to protein-calorie malnutrition (HCC)   Iron deficiency anemia   Sacral decubitus ulcer   Chronic kidney disease, stage 3b (HCC)   Mixed hyperlipidemia   Acquired hypothyroidism   Anxiety   Chronic pain syndrome   Assessment and Plan:  Recurrent UTI/cystitis/pyelonephritis -she was hospitalized from 11/16 to 11/21 for ecoli  ( resistent to cipro, augmentin, bactrim) pyelonephritis and bacteremia, she was treated with total 7 days of antibiotics -present with fever and confusion, reports left flank pain at home -CT of abdomen and pelvis was suggestive of  cystitis/pyelitis with 10 mm nonobstructing calculus within the left lower polar infundibulum. -urine and blood culture no growth -she is seen by urology, she prefers conservative management for now, urology recommend total of 2 weeks antibiotic treatment and outpatient follow-up with urology, she was treated with Rocephin in the hospital, discharged on [place]   Lobar pneumonia? CT showed progressive consolidation of LLL  Procalcitonin less tahn 0.1 Incentive spirometer,  get speech eval Already on abx, she does not cough, no hypoxia Pcp to repeat cxr in 3-4 weeks   Acute metabolic encephalopathy CT head no acute finding Greatly improved, but continue to has short term memory issues, not able to remember talking to urologist yesterday, not able to remember recent hospitalization a few weeks ago Daughter reports she has been having progressive memory issues for at least two and half a year B12 lower normal, continue b12 and thiamine supplement Recommend f/u with  pcp and neurology   Chronic pain syndrome On opioids will be temporarily held due to acute metabolic encephalopathy, resumed once mental status improved She denies new pain today   AKI on CKDIIIa -Per chart review she had urinary retention during her last hospitalization -renal ultrasound this time showed "Normal size and ultrasound appearance of the kidneys and bladder. No hydronephrosis." - PVR 44 by bladder scan on 12/7 -Cr peaked at 1.74, cr improved to 1.15 at discharge F/u with pcp     Type 2 diabetes mellitus , uncontrolled with hyperglycemia hemoglobin A1c on 04/18/2022 was 7.5 Does not appear to be on insulin at home, discharged on ssi, ( husband has diabetes, he knows how to  do ssi) Continue home meds glipizide and metformin -Follow-up with PCP   [facility] Hospital report patient to blood pressure medication was held 3 to 4 months ago due to low blood pressure , now her blood pressure is elevated , resume all her  home blood pressure medication , she is instructed to check blood pressure twice a day , follow-up with PCP for further blood pressure medication titration    Sacral decubitus ulcer 3, presents on admission Documented during last hospitalization as well Continue wound care Pressure offloading    Hypoalbuminemia possibly secondary to mild protein calorie malnutrition Protein supplement will be provided Reports unintentional weight loss   FTT She stopped driving many years ago due to chronic headache and on chronic pain meds, most activity is walking in very slow pace, has a walker but does not use walker , she falls often at baseline. She lives with her husband who just had partial amputation and walking with crutches  Home with home health     Discharge Exam: BP (!) 167/71 (BP Location: Left Arm)   Pulse 99   Temp 98 F (36.7 C) (Oral)   Resp 18   Ht '5\' 5"'$  (1.651 m)   Wt 58.1 kg   SpO2 98%   BMI 21.30 kg/m   General: NAD, pleasant , poor short-term memory Cardiovascular: RRR Respiratory: Normal respiratory effort    Discharge Instructions     Diet general   Complete by: As directed    Soft diet, carb modified   Increase activity slowly   Complete by: As directed       Allergies as of 05/07/2022       Reactions   Ramipril    Possible ACE-I induced angioedema    Imitrex [sumatriptan] Swelling   States she had swelling in neck   Lyrica [pregabalin] Other (See Comments)   Severe Leg and feet swelling   Penicillins Rash        Medication List     STOP taking these medications    nicotine 21 mg/24hr patch Commonly known as: NICODERM CQ - dosed in mg/24 hours       TAKE these medications    acetaminophen 325 MG tablet Commonly known as: TYLENOL Take 2 tablets (650 mg total) by mouth every 6 (six) hours as needed for mild pain, headache or fever (or Fever >/= 101).   ALPRAZolam 1 MG tablet Commonly known as: XANAX Take 0.5 mg by mouth in the  morning, at noon, in the evening, and at bedtime.   amLODipine 10 MG tablet Commonly known as: NORVASC Take 1 tablet (10 mg total) by mouth daily.   ascorbic acid 500 MG tablet Commonly known as: VITAMIN C Take 1 tablet (500 mg total) by mouth daily.   aspirin EC 81 MG tablet Take 1 tablet (81 mg total) by mouth daily with breakfast.   atorvastatin 20 MG tablet Commonly known as: LIPITOR Take 20 mg by mouth daily.   cefadroxil 500 MG capsule Commonly known as: DURICEF Take 1 capsule (500 mg total) by mouth 2 (two) times daily for 10 days.   cyanocobalamin 1000 MCG tablet Commonly known as: VITAMIN B12 Take 1 tablet (1,000 mcg total) by mouth daily.   EPINEPHrine 0.3 mg/0.3 mL Soaj injection Commonly known as: EPI-PEN Inject 0.3 mg into the muscle as needed for anaphylaxis. Inject as needed for anaphlaxis   gabapentin 600 MG tablet Commonly known as: NEURONTIN Take 600 mg by mouth 3 (three) times daily.  glipiZIDE 5 MG tablet Commonly known as: GLUCOTROL Take 1 tablet (5 mg total) by mouth 2 (two) times daily before a meal. What changed: when to take this   hydrALAZINE 50 MG tablet Commonly known as: APRESOLINE Take 2 tablets (100 mg total) by mouth 3 (three) times daily. What changed:  how much to take when to take this   insulin lispro 100 UNIT/ML KwikPen Commonly known as: HumaLOG KwikPen Insulin sliding scale: Blood sugar  120-150   3units                       151-200   4units                       201-250   7units                       251- 300  11units                       301-350   15uints                       351-400   20units                       >400         call MD immediately   isosorbide mononitrate 30 MG 24 hr tablet Commonly known as: IMDUR Take 2 tablets (60 mg total) by mouth daily. What changed: how much to take   levothyroxine 25 MCG tablet Commonly known as: SYNTHROID Take 25 mcg by mouth daily.   metFORMIN 500 MG  tablet Commonly known as: GLUCOPHAGE Take 500 mg by mouth 2 (two) times daily with a meal.   metoprolol tartrate 100 MG tablet Commonly known as: LOPRESSOR Take 100 mg by mouth 2 (two) times a day.   multivitamin with minerals Tabs tablet Take 1 tablet by mouth daily.   oxyCODONE 5 MG immediate release tablet Commonly known as: Oxy IR/ROXICODONE Take 10 mg by mouth every 6 (six) hours as needed for moderate pain (pain.).   polyethylene glycol powder 17 GM/SCOOP powder Commonly known as: GLYCOLAX/MIRALAX Take 17 g by mouth daily.   thiamine 100 MG tablet Commonly known as: VITAMIN B1 Take 1 tablet (100 mg total) by mouth daily.   tiZANidine 4 MG tablet Commonly known as: ZANAFLEX Take 1 tablet (4 mg total) by mouth at bedtime. What changed:  how much to take when to take this reasons to take this   Vitamin D-3 125 MCG (5000 UT) Tabs Take 5,000 Units by mouth daily.   zinc sulfate 220 (50 Zn) MG capsule Take 1 capsule (220 mg total) by mouth daily.       Allergies  Allergen Reactions   Ramipril     Possible ACE-I induced angioedema    Imitrex [Sumatriptan] Swelling    States she had swelling in neck   Lyrica [Pregabalin] Other (See Comments)    Severe Leg and feet swelling   Penicillins Rash    Follow-up Information     Redmond School, MD Follow up in 1 week(s).   Specialty: Internal Medicine Why: hospital discharge follow up, repeat basic lab works including cbc/bmp please check your blood pressure at home twice a day, bring in record for your pcp to review pcp to monitor your blood glucose, blood pressure and  weight  loss pcp to repeat cxr in 3-4 weeks to ensure resolution of left lower lobe consodiation Contact information: [place] [address] [phone]         Cleon Gustin, MD Follow up in 3 day(s).   Specialty: Urology Why: to follow up on left side kidney stone Contact information: [place] [address] [phone]         Nicholaus Bloom, MD Follow up.   Specialty: Anesthesiology Why: chronic pain management Contact information: [place], [address] [place] [address] [phone]         [facility] Follow up.   Specialty: Neurology Why: for chronic headache and memory issues Contact information: [address] [place] [phone]                 The results of significant diagnostics from this hospitalization (including imaging, microbiology, ancillary and laboratory) are listed below for reference.    Significant Diagnostic Studies: US RENAL  Result Date: 05/05/2022 CLINICAL DATA:  Elevated creatinine EXAM: RENAL / URINARY TRACT ULTRASOUND COMPLETE COMPARISON:  None Available. FINDINGS: Right Kidney: Renal measurements: 9.8 x 4.8 x 5.6 cm = volume: 139 mL. Echogenicity within normal limits. No mass or hydronephrosis visualized. Left Kidney: Renal measurements: 9.6 x 4.7 x 5.2 cm = volume: 124 mL. Echogenicity within normal limits. No mass or hydronephrosis visualized. Bladder: Appears normal for degree of bladder distention. Other: None. IMPRESSION: Normal size and ultrasound appearance of the kidneys and bladder. No hydronephrosis. Electronically Signed   By: Delanna Ahmadi M.D.   On: 05/05/2022 12:22   CT Head Wo Contrast  Result Date: 05/03/2022 CLINICAL DATA:  Mental status change, unknown cause, fever, headache EXAM: CT HEAD WITHOUT CONTRAST TECHNIQUE: Contiguous axial images were obtained from the base of the skull through the vertex without intravenous contrast. RADIATION DOSE REDUCTION: This exam was performed according to the departmental dose-optimization program which includes automated exposure control, adjustment of the mA and/or kV according to patient size and/or use of iterative reconstruction technique. COMPARISON:  04/15/2022 FINDINGS: Brain: Normal anatomic  configuration. Parenchymal volume loss is commensurate with the patient's age. Stable mild periventricular white matter changes are present likely reflecting the sequela of small vessel ischemia. Dilated perivascular space noted within the inferior right basal ganglia. No abnormal intra or extra-axial mass lesion or fluid collection. No abnormal mass effect or midline shift. No evidence of acute intracranial hemorrhage or infarct. Ventricular size is normal. Cerebellum unremarkable. Vascular: No asymmetric hyperdense vasculature at the skull base. Skull: Intact Sinuses/Orbits: Paranasal sinuses are clear. Orbits are unremarkable. Other: Mastoid air cells and middle ear cavities are clear. IMPRESSION: 1. No acute intracranial hemorrhage or infarct. 2. Stable mild senescent change. Electronically Signed   By: Fidela Salisbury M.D.   On: 05/03/2022 23:20   CT Abdomen Pelvis W Contrast  Result Date: 05/03/2022 CLINICAL DATA:  Fever, headache, left leg pain, pyelonephritis EXAM: CT ABDOMEN AND PELVIS WITH CONTRAST TECHNIQUE: Multidetector CT imaging of the abdomen and pelvis was performed using the standard protocol following bolus administration of intravenous contrast. RADIATION DOSE REDUCTION: This exam was performed according to the departmental dose-optimization program which includes automated exposure control, adjustment of the mA and/or kV according to patient size and/or use of iterative reconstruction technique. CONTRAST:  12m OMNIPAQUE IOHEXOL 300 MG/ML  SOLN COMPARISON:  04/15/2022 FINDINGS: Lower chest: There is progressive consolidation within the visualized left lung base, in keeping  with changes of acute infection or aspiration in the appropriate clinical setting. Atelectasis within the medial right middle lobe. No pleural effusion. Cardiac size within normal limits. No pericardial effusion. Hepatobiliary: No focal liver abnormality is seen. No gallstones, gallbladder wall thickening, or biliary  dilatation. Pancreas: Unremarkable Spleen: Unremarkable Adrenals/Urinary Tract: The adrenal glands are unremarkable. The kidneys are normal in size and position. An 8 x 9 x 10 mm nonobstructing calculus is seen within the left lower polar infundibulum no additional renal or ureteral calculi are identified. There is no hydronephrosis. No enhancing intrarenal mass identified. There is asymmetric left urothelial enhancement involving the left pelvic calyceal system and left ureter as well as circumferential bladder wall hyperemia and mild perivesicular inflammatory stranding suggesting changes of infectious or inflammatory cystitis/pyelitis. There is gas seen non dependently within the bladder lumen which is nonspecific and can be seen the setting instrumentation or a gas-forming infection. The bladder is not distended. Stomach/Bowel: Moderate colonic stool burden without evidence of obstruction. The stomach, small bowel, and large bowel are otherwise unremarkable. Appendix normal. No free intraperitoneal gas or fluid. Vascular/Lymphatic: Aortic atherosclerosis. No enlarged abdominal or pelvic lymph nodes. Reproductive: Status post hysterectomy. No adnexal masses. Other: No abdominal wall hernia or abnormality. No abdominopelvic ascites. Musculoskeletal: Superficial ulceration and subcutaneous soft tissue infiltration is seen involving the sacral soft tissues superficial to the coccyx in keeping with a sacral decubitus ulcer. While the inflammatory changes extend to the coccyx, no superimposed osseous erosion to suggest osteomyelitis in this location. There is thickening, hypoenhancement, and mild perifascial inflammatory stranding involving the medial right gluteus maximus muscle best seen on axial image # 86/3, suggesting focal myositis/myonecrosis in this location. A discrete abscess, however, is not clearly identified. The osseous structures are age-appropriate. No lytic or blastic bone lesions are seen.  IMPRESSION: 1. Progressive consolidation within the visualized left lung base, in keeping with changes of acute infection or aspiration in the appropriate clinical setting. 2. 10 mm nonobstructing calculus within the left lower polar infundibulum. 3. Asymmetric left urothelial enhancement involving the left pelvic calyceal system and left ureter as well as circumferential bladder wall hyperemia and mild perivesicular inflammatory stranding suggesting changes of infectious or inflammatory cystitis/pyelitis. Gas seen non dependently within the bladder lumen is nonspecific and can be seen the setting of instrumentation or a gas-forming infection. Correlation with urinalysis and urine culture may be helpful. 4. Grade 3 sacral decubitus ulcer. 5. Focal thickening, hypoenhancement, and mild perifascial inflammatory stranding involving the medial right gluteus maximus muscle, suggesting focal myositis/myonecrosis in this location. 6. Moderate colonic stool burden without evidence of obstruction. 7. Aortic atherosclerosis. Aortic Atherosclerosis (ICD10-I70.0). Electronically Signed   By: Fidela Salisbury M.D.   On: 05/03/2022 23:06   DG Chest 2 View  Result Date: 05/03/2022 CLINICAL DATA:  Cough EXAM: CHEST - 2 VIEW COMPARISON:  Chest x-ray 05/03/2022 FINDINGS: Patchy airspace disease in the left lung base appears unchanged. No pleural effusion or pneumothorax. Cardiomediastinal silhouette stable. Sternotomy wires are again noted. No acute fractures. IMPRESSION: Stable patchy airspace disease in the left lung base. Electronically Signed   By: Ronney Asters M.D.   On: 05/03/2022 22:21   DG Chest Port 1 View  Result Date: 05/03/2022 CLINICAL DATA:  Questionable sepsis.  Evaluate for an abnormality. EXAM: PORTABLE CHEST 1 VIEW COMPARISON:  04/15/2022 FINDINGS: Single-view of the chest demonstrates low lung volumes. Subtle densities at the left lung base. Heart size is stable with median sternotomy wires. Negative for a  pneumothorax.  IMPRESSION: Low lung volumes with subtle densities at the left lung base. Findings likely represent atelectasis but difficult to exclude subtle infection. Consider follow-up two view chest for further characterization. Electronically Signed   By: Markus Daft M.D.   On: 05/03/2022 18:52   ECHOCARDIOGRAM COMPLETE  Result Date: 04/17/2022    ECHOCARDIOGRAM REPORT   Patient Name:   LIZMARY NADER Date of Exam: 04/17/2022 Medical Rec #:  [id number]    Height:       65.0 in Accession #:    [id number]   Weight:       128.3 lb Date of Birth:  [birth year]     BSA:          1.638 m Patient Age:    42 years     BP:           178/76 mmHg Patient Gender: F            HR:           72 bpm. Exam Location:  Inpatient Procedure: 2D Echo Indications:    atrial fibrillation  History:        Patient has prior history of Echocardiogram examinations, most                 recent 08/10/2018. CAD; Risk Factors:Hypertension, Diabetes and                 Dyslipidemia.  Sonographer:    Harvie Junior Referring Phys: [place]  Sonographer Comments: Technically difficult study due to poor echo windows. AMS. IMPRESSIONS  1. Left ventricular ejection fraction, by estimation, is 60 to 65%. The left ventricle has normal function. The left ventricle has no regional wall motion abnormalities. Left ventricular diastolic parameters were normal.  2. Right ventricular systolic function is normal. The right ventricular size is normal. There is normal pulmonary artery systolic pressure.  3. The mitral valve is normal in structure. Trivial mitral valve regurgitation. No evidence of mitral stenosis.  4. The aortic valve has an indeterminant number of cusps. Aortic valve regurgitation is not visualized. No aortic stenosis is present.  5. The inferior vena cava is normal in size with greater than 50% respiratory variability, suggesting right atrial pressure of 3 mmHg. FINDINGS  Left Ventricle: Left ventricular ejection fraction, by  estimation, is 60 to 65%. The left ventricle has normal function. The left ventricle has no regional wall motion abnormalities. The left ventricular internal cavity size was normal in size. There is  no left ventricular hypertrophy. Left ventricular diastolic parameters were normal. Right Ventricle: The right ventricular size is normal. Right ventricular systolic function is normal. There is normal pulmonary artery systolic pressure. The tricuspid regurgitant velocity is 2.66 m/s, and with an assumed right atrial pressure of 3 mmHg,  the estimated right ventricular systolic pressure is 35.4 mmHg. Left Atrium: Left atrial size was normal in size. Right Atrium: Right atrial size was normal in size. Pericardium: There is no evidence of pericardial effusion. Mitral Valve: The mitral valve is normal in structure. Trivial mitral valve regurgitation. No evidence of mitral valve stenosis. Tricuspid Valve: The tricuspid valve is normal in structure. Tricuspid valve regurgitation is trivial. No evidence of tricuspid stenosis. Aortic Valve: The aortic valve has an indeterminant number of cusps. Aortic valve regurgitation is not visualized. No aortic stenosis is present. Aortic valve mean gradient measures 3.0 mmHg. Aortic valve peak gradient measures 5.6 mmHg. Aortic valve area, by VTI measures 1.85 cm. Pulmonic Valve:  The pulmonic valve was not well visualized. Pulmonic valve regurgitation is not visualized. No evidence of pulmonic stenosis. Aorta: The aortic root is normal in size and structure. Venous: The inferior vena cava is normal in size with greater than 50% respiratory variability, suggesting right atrial pressure of 3 mmHg. IAS/Shunts: No atrial level shunt detected by color flow Doppler.  LEFT VENTRICLE PLAX 2D LVIDd:         4.00 cm      Diastology LVIDs:         2.50 cm      LV e' medial:    7.83 cm/s LV PW:         1.00 cm      LV E/e' medial:  10.8 LV IVS:        0.80 cm      LV e' lateral:   9.57 cm/s LVOT  diam:     1.60 cm      LV E/e' lateral: 8.9 LV SV:         47 LV SV Index:   29 LVOT Area:     2.01 cm  LV Volumes (MOD) LV vol d, MOD A2C: 54.1 ml LV vol d, MOD A4C: 112.0 ml LV vol s, MOD A2C: 26.5 ml LV vol s, MOD A4C: 39.1 ml LV SV MOD A2C:     27.6 ml LV SV MOD A4C:     112.0 ml LV SV MOD BP:      53.4 ml RIGHT VENTRICLE RV S prime:     12.90 cm/s TAPSE (M-mode): 1.3 cm LEFT ATRIUM           Index LA diam:      3.20 cm 1.95 cm/m LA Vol (A2C): 25.0 ml 15.26 ml/m LA Vol (A4C): 49.6 ml 30.28 ml/m  AORTIC VALVE                    PULMONIC VALVE AV Area (Vmax):    1.87 cm     PV Vmax:       0.98 m/s AV Area (Vmean):   1.80 cm     PV Peak grad:  3.8 mmHg AV Area (VTI):     1.85 cm AV Vmax:           118.00 cm/s AV Vmean:          85.500 cm/s AV VTI:            0.257 m AV Peak Grad:      5.6 mmHg AV Mean Grad:      3.0 mmHg LVOT Vmax:         110.00 cm/s LVOT Vmean:        76.400 cm/s LVOT VTI:          0.236 m LVOT/AV VTI ratio: 0.92  AORTA Ao Root diam: 2.70 cm MITRAL VALVE               TRICUSPID VALVE MV Area (PHT): 3.60 cm    TR Peak grad:   28.3 mmHg MV Decel Time: 211 msec    TR Vmax:        266.00 cm/s MR Peak grad: 39.2 mmHg MR Vmax:      313.00 cm/s  SHUNTS MV E velocity: 84.80 cm/s  Systemic VTI:  0.24 m MV A velocity: 73.70 cm/s  Systemic Diam: 1.60 cm MV E/A ratio:  1.15 Kirk Ruths MD Electronically signed by Kirk Ruths MD Signature Date/Time: 04/17/2022/3:31:04 PM  Final    Overnight EEG with video  Result Date: 04/16/2022 Lora Havens, MD     04/17/2022  9:27 AM Patient Name: AUGUST GOSSER MRN: [id number] Epilepsy Attending: Lora Havens Referring Physician/Provider: Lorenza Chick, MD Duration: 04/16/2022 4098 to 1558 Patient history: 61yo F with ams. EEG to evaluate for seizure Level of alertness: Awake, asleep AEDs during EEG study: None Technical aspects: This EEG study was done with scalp electrodes positioned according to the 10-20 International system of electrode  placement. Electrical activity was reviewed with band pass filter of 1-'70Hz'$ , sensitivity of 7 uV/mm, display speed of 65m/sec with a '60Hz'$  notched filter applied as appropriate. EEG data were recorded continuously and digitally stored.  Video monitoring was available and reviewed as appropriate. Description: The posterior dominant rhythm consists of 7 Hz activity of moderate voltage (25-35 uV) seen predominantly in posterior head regions, symmetric and reactive to eye opening and eye closing. Sleep was characterized by vertex waves, sleep spindles (12 to 14 Hz), maximal frontocentral region. EEG showed continuous/intermittent generalized predominantly 5-7 Hz theta slowing admixed with intermittent 2-'3hz'$  delta slowing. Hyperventilation and photic stimulation were not performed.   ABNORMALITY - Continuous slow, generalized IMPRESSION: This study is suggestive of mild to moderate diffuse encephalopathy, nonspecific etiology. No seizures or epileptiform discharges were seen throughout the recording. PLora Havens  MR CERVICAL SPINE WO CONTRAST  Result Date: 04/16/2022 CLINICAL DATA:  Myelopathy, acute, cervical spine Right upper extremity weakness. EXAM: MRI CERVICAL SPINE WITHOUT CONTRAST TECHNIQUE: Multiplanar, multisequence MR imaging of the cervical spine was performed. No intravenous contrast was administered. COMPARISON:  04/23/2005 FINDINGS: Alignment: Physiologic. Vertebrae: No fracture, evidence of discitis, or bone lesion. Cord: Normal signal and morphology. Posterior Fossa, vertebral arteries, paraspinal tissues: Negative. Disc levels: C1-2: Unremarkable. C2-3: Normal disc space and facet joints. There is no spinal canal stenosis. No neural foraminal stenosis. C3-4: Small disc bulge with bilateral uncovertebral hypertrophy. There is no spinal canal stenosis. Unchanged severe right and moderate left neural foraminal stenosis. C4-5: Normal disc space and facet joints. There is no spinal canal stenosis.  No neural foraminal stenosis. C5-6: Normal disc space and facet joints. There is no spinal canal stenosis. No neural foraminal stenosis. C6-7: Normal disc space and facet joints. There is no spinal canal stenosis. No neural foraminal stenosis. C7-T1: Normal disc space and facet joints. There is no spinal canal stenosis. No neural foraminal stenosis. IMPRESSION: 1. Unchanged severe right and moderate left C3-4 neural foraminal stenosis secondary to uncovertebral hypertrophy. 2. No spinal canal stenosis. Electronically Signed   By: KUlyses JarredM.D.   On: 04/16/2022 02:14   MR BRAIN WO CONTRAST  Result Date: 04/16/2022 CLINICAL DATA:  New onset seizure EXAM: MRI HEAD WITHOUT CONTRAST TECHNIQUE: Multiplanar, multiecho pulse sequences of the brain and surrounding structures were obtained without intravenous contrast. COMPARISON:  None Available. FINDINGS: Brain: No acute infarct, mass effect or extra-axial collection. No acute or chronic hemorrhage. There is multifocal hyperintense T2-weighted signal within the white matter. Generalized volume loss. The midline structures are normal. Vascular: Major flow voids are preserved. Skull and upper cervical spine: Normal calvarium and skull base. Visualized upper cervical spine and soft tissues are normal. Sinuses/Orbits:No paranasal sinus fluid levels or advanced mucosal thickening. No mastoid or middle ear effusion. Normal orbits. IMPRESSION: 1. No acute intracranial abnormality. 2. Findings of chronic small vessel ischemia and volume loss. Electronically Signed   By: KUlyses JarredM.D.   On: 04/16/2022 02:09   CT  CHEST ABDOMEN PELVIS WO CONTRAST  Result Date: 04/15/2022 CLINICAL DATA:  Seizure activity today, possible sepsis EXAM: CT CHEST, ABDOMEN AND PELVIS WITHOUT CONTRAST TECHNIQUE: Multidetector CT imaging of the chest, abdomen and pelvis was performed following the standard protocol without IV contrast. RADIATION DOSE REDUCTION: This exam was performed  according to the departmental dose-optimization program which includes automated exposure control, adjustment of the mA and/or kV according to patient size and/or use of iterative reconstruction technique. COMPARISON:  Multiple priors including CT November 10, 2021 FINDINGS: CT CHEST FINDINGS Cardiovascular: Aortic atherosclerosis. Enlarged main pulmonary artery measures 3.2 cm. Cardiac enlargement. Coronary artery calcifications. Mediastinum/Nodes: Partially calcified 9 mm right thyroid nodule. Not clinically significant; no follow-up imaging recommended (ref: J Am Coll Radiol. 2015 Feb;12(2): 143-50).No pathologically enlarged mediastinal, hilar or axillary lymph nodes, noting limited sensitivity for the detection of hilar adenopathy on this noncontrast study. Patulous esophagus with retained/refluxed contents in the esophagus. Lungs/Pleura: Patchy bilateral ground-glass opacities with multifocal linear/banded consolidative opacities likely reflect a combination of atelectasis and an infectious or inflammatory process. No pleural effusion. No pneumothorax. Musculoskeletal: Prior median sternotomy. No acute osseous abnormality. CT ABDOMEN PELVIS FINDINGS Hepatobiliary: Unremarkable noncontrast enhanced appearance of the hepatic parenchyma. Gallbladder is distended. No biliary ductal dilation. Pancreas: No pancreatic ductal dilation or evidence of acute inflammation. Spleen: No splenomegaly. Adrenals/Urinary Tract: Similar thickening of the bilateral adrenal glands without discrete nodularity, favored benign hyperplasia and requiring no follow-up. Perinephric and Peri pelvic stranding. Nonobstructive 9 mm left upper pole renal stone. Hydronephrosis. Gas in the urinary bladder. Stomach/Bowel: Stomach is minimally distended limiting evaluation. No pathologic dilation of small or large bowel. Normal appendix. Moderate volume of formed stool throughout the colon. Mild rectal and sigmoid colon wall thickening for instance on  105/2. Vascular/Lymphatic: Aortic atherosclerosis. No pathologically enlarged abdominal or pelvic lymph nodes. Reproductive: Status post hysterectomy. No adnexal masses. Other: Mild nonspecific body wall edema. No significant abdominopelvic free fluid. No walled off fluid collections. Musculoskeletal: No acute osseous abnormality. IMPRESSION: 1. Patchy bilateral ground-glass opacities with multifocal linear/banded consolidative opacities likely reflect a combination of atelectasis and an infectious or inflammatory process. 2. Mild rectal and sigmoid colon wall thickening, which may reflect a nonspecific proctocolitis. 3. Mild perinephric and peripelvic stranding with a nonobstructive 9 mm left upper pole renal stone. Suggest correlation with laboratory values for infection. 4. Gas in the urinary bladder, correlate for recent instrumentation. 5. Moderate volume of formed stool throughout the colon. Correlate for constipation. 6. Enlarged main pulmonary artery, which can be seen in the setting of pulmonary arterial hypertension. 7.  Aortic Atherosclerosis (ICD10-I70.0). Electronically Signed   By: Dahlia Bailiff M.D.   On: 04/15/2022 18:48   CT Head Wo Contrast  Result Date: 04/15/2022 CLINICAL DATA:  Seizure. EXAM: CT HEAD WITHOUT CONTRAST TECHNIQUE: Contiguous axial images were obtained from the base of the skull through the vertex without intravenous contrast. RADIATION DOSE REDUCTION: This exam was performed according to the departmental dose-optimization program which includes automated exposure control, adjustment of the mA and/or kV according to patient size and/or use of iterative reconstruction technique. COMPARISON:  November 10, 2021. FINDINGS: Brain: No evidence of acute infarction, hemorrhage, hydrocephalus, extra-axial collection or mass lesion/mass effect. Vascular: No hyperdense vessel or unexpected calcification. Skull: Normal. Negative for fracture or focal lesion. Sinuses/Orbits: No acute finding.  Other: None. IMPRESSION: No acute intracranial abnormality seen. Electronically Signed   By: Marijo Conception M.D.   On: 04/15/2022 15:21   DG Chest [facility]  Result Date: 04/15/2022 CLINICAL DATA:  Seizure EXAM: PORTABLE CHEST 1 VIEW COMPARISON:  11/10/2021 FINDINGS: Single frontal view of the chest demonstrates a stable cardiac silhouette. Postsurgical changes from median sternotomy. No airspace disease, effusion, or pneumothorax. No acute bony abnormalities. IMPRESSION: 1. No acute intrathoracic process. Electronically Signed   By: Randa Ngo M.D.   On: 04/15/2022 15:05    Microbiology: Recent Results (from the past 240 hour(s))  Resp Panel by RT-PCR (Flu A&B, Covid) Anterior Nasal Swab     Status: None   Collection Time: 05/03/22  5:18 PM   Specimen: Anterior Nasal Swab  Result Value Ref Range Status   SARS Coronavirus 2 by RT PCR NEGATIVE NEGATIVE Final    Comment: (NOTE) SARS-CoV-2 target nucleic acids are NOT DETECTED.  The SARS-CoV-2 RNA is generally detectable in upper respiratory specimens during the acute phase of infection. The lowest concentration of SARS-CoV-2 viral copies this assay can detect is 138 copies/mL. A negative result does not preclude SARS-Cov-2 infection and should not be used as the sole basis for treatment or other patient management decisions. A negative result may occur with  improper specimen collection/handling, submission of specimen other than nasopharyngeal swab, presence of viral mutation(s) within the areas targeted by this assay, and inadequate number of viral copies(<138 copies/mL). A negative result must be combined with clinical observations, patient history, and epidemiological information. The expected result is Negative.  Fact Sheet for Patients:  EntrepreneurPulse.com.au  Fact Sheet for Healthcare Providers:  IncredibleEmployment.be  This test is no t yet approved or cleared by the [place] FDA and  has been authorized for detection and/or diagnosis of SARS-CoV-2 by FDA under an Emergency Use Authorization (EUA). This EUA will remain  in effect (meaning this test can be used) for the duration of the COVID-19 declaration under Section 564(b)(1) of the Act, 21 U.S.C.section 360bbb-3(b)(1), unless the authorization is terminated  or revoked sooner.       Influenza A by PCR NEGATIVE NEGATIVE Final   Influenza B by PCR NEGATIVE NEGATIVE Final    Comment: (NOTE) The Xpert Xpress SARS-CoV-2/FLU/RSV plus assay is intended as an aid in the diagnosis of influenza from Nasopharyngeal swab specimens and should not be used as a sole basis for treatment. Nasal washings and aspirates are unacceptable for Xpert Xpress SARS-CoV-2/FLU/RSV testing.  Fact Sheet for Patients: EntrepreneurPulse.com.au  Fact Sheet for Healthcare Providers: IncredibleEmployment.be  This test is not yet approved or cleared by the [place] FDA and has been authorized for detection and/or diagnosis of SARS-CoV-2 by FDA under an Emergency Use Authorization (EUA). This EUA will remain in effect (meaning this test can be used) for the duration of the COVID-19 declaration under Section 564(b)(1) of the Act, 21 U.S.C. section 360bbb-3(b)(1), unless the authorization is terminated or revoked.  Performed at [facility], [address] [place], [place] [address]   Urine Culture     Status: None   Collection Time: 05/03/22  6:59 PM   Specimen: Urine, Clean Catch  Result Value Ref Range Status   Specimen Description   Final    URINE, CLEAN CATCH Performed at [facility], [address], [place], [place] [address]    Special Requests   Final    NONE Performed at [facility], [address], [place], [place] [address]    Culture   Final    NO GROWTH Performed at [facility], [place] [address] [place], [place] [address]    Report Status 05/05/2022  FINAL   Final  Blood Culture (routine x 2)     Status: None (Preliminary result)   Collection Time: 05/03/22  7:25 PM   Specimen: Left Antecubital; Blood  Result Value Ref Range Status   Specimen Description LEFT ANTECUBITAL  Final   Special Requests   Final    BOTTLES DRAWN AEROBIC AND ANAEROBIC Blood Culture adequate volume   Culture   Final    NO GROWTH 3 DAYS Performed at [facility], [address] [place], [place] [address]    Report Status PENDING  Incomplete  Blood Culture (routine x 2)     Status: None (Preliminary result)   Collection Time: 05/03/22  7:25 PM   Specimen: Right Antecubital; Blood  Result Value Ref Range Status   Specimen Description RIGHT ANTECUBITAL  Final   Special Requests   Final    BOTTLES DRAWN AEROBIC AND ANAEROBIC Blood Culture results may not be optimal due to an excessive volume of blood received in culture bottles   Culture   Final    NO GROWTH 3 DAYS Performed at [facility], [address], [place], [place] [address]    Report Status PENDING  Incomplete     Labs: Basic Metabolic Panel: Recent Labs  Lab 05/03/22 1925 05/04/22 0429 05/05/22 0510 05/06/22 0548 05/07/22 0506  NA 138 140 137 138 135  K 4.7 4.7 4.5 4.2 3.7  CL 102 107 106 110 104  CO2 '25 26 24 '$ 19* 17*  GLUCOSE 263* 143* 172* 171* 202*  BUN 21 18 33* 26* 21  CREATININE 1.47* 1.24* 1.74* 1.46* 1.15*  CALCIUM 9.2 8.8* 8.4* 8.5* 8.9  MG  --  1.9  --  2.3  --   PHOS  --  4.1  --   --   --    Liver Function Tests: Recent Labs  Lab 05/03/22 1925 05/04/22 0429  AST 19 13*  ALT 13 11  ALKPHOS 90 69  BILITOT 0.6 0.6  PROT 7.5 6.9  ALBUMIN 3.4* 3.1*   No results for input(s): "LIPASE", "AMYLASE" in the last 168 hours. Recent Labs  Lab 05/03/22 1925  AMMONIA 10   CBC: Recent Labs  Lab 05/03/22 1925 05/04/22 0429 05/05/22 0510  WBC 9.7 7.9 5.1  NEUTROABS 8.3*  --   --   HGB 9.7* 9.5* 9.2*  HCT 32.4* 31.0* 29.6*  MCV 85.0 84.0 84.1  PLT 322 304 348   Cardiac  Enzymes: Recent Labs  Lab 05/03/22 1925  CKTOTAL 33*   BNP: BNP (last 3 results) No results for input(s): "BNP" in the last 8760 hours.  ProBNP (last 3 results) No results for input(s): "PROBNP" in the last 8760 hours.  CBG: Recent Labs  Lab 05/06/22 1111 05/06/22 1624 05/06/22 2202 05/07/22 0721 05/07/22 1109  GLUCAP 189* 213* 149* 203* 172*    FURTHER DISCHARGE INSTRUCTIONS:   Get Medicines reviewed and adjusted: Please take all your medications with you for your next visit with your Primary MD   Laboratory/radiological data: Please request your Primary MD to go over all hospital tests and procedure/radiological results at the follow up, please ask your Primary MD to get all Hospital records sent to his/her office.   In some cases, they will be blood work, cultures and biopsy results pending at the time of your discharge. Please request that your primary care M.D. goes through all the records of your hospital data and follows up on these results.   Also Note the following: If you  experience worsening of your admission symptoms, develop shortness of breath, life threatening emergency, suicidal or homicidal thoughts you must seek medical attention immediately by calling 911 or calling your MD immediately  if symptoms less severe.   You must read complete instructions/literature along with all the possible adverse reactions/side effects for all the Medicines you take and that have been prescribed to you. Take any new Medicines after you have completely understood and accpet all the possible adverse reactions/side effects.    Do not drive when taking Pain medications or sleeping medications (Benzodaizepines)   Do not take more than prescribed Pain, Sleep and Anxiety Medications. It is not advisable to combine anxiety,sleep and pain medications without talking with your primary care practitioner   Special Instructions: If you have smoked or chewed Tobacco  in the last 2 yrs  please stop smoking, stop any regular Alcohol  and or any Recreational drug use.   Wear Seat belts while driving.   Please note: You were cared for by a hospitalist during your hospital stay. Once you are discharged, your primary care physician will handle any further medical issues. Please note that NO REFILLS for any discharge medications will be authorized once you are discharged, as it is imperative that you return to your primary care physician (or establish a relationship with a primary care physician if you do not have one) for your post hospital discharge needs so that they can reassess your need for medications and monitor your lab values.     Signed:  Florencia Reasons MD, PhD, FACP  [facility] 05/07/2022, 12:32 PM

## 2022-05-08 LAB — CULTURE, BLOOD (ROUTINE X 2)
Culture: NO GROWTH
Culture: NO GROWTH
Special Requests: ADEQUATE

## 2022-05-10 ENCOUNTER — Telehealth: Payer: Self-pay | Admitting: *Deleted

## 2022-05-10 NOTE — Progress Notes (Signed)
  Care Coordination   Note   05/10/2022 Name: DEONNA KRUMMEL MRN: [id number] DOB: 1960/12/26  Georgia Dom Cheek is a 61 y.o. year old female who sees Redmond School, MD for primary care. I reached out to [name] by phone today to offer care coordination services.  Ms. Castillo was given information about Care Coordination services today including:   The Care Coordination services include support from the care team which includes your Nurse Coordinator, Clinical Social Worker, or Pharmacist.  The Care Coordination team is here to help remove barriers to the health concerns and goals most important to you. Care Coordination services are voluntary, and the patient may decline or stop services at any time by request to their care team member.   Care Coordination Consent Status: Patient agreed to services and verbal consent obtained.   Follow up plan:  Telephone appointment with care coordination team member scheduled for:  05/11/22  Encounter Outcome:  Pt. Scheduled  [place]  Direct Dial: [phone]

## 2022-05-11 ENCOUNTER — Ambulatory Visit: Payer: Self-pay | Admitting: *Deleted

## 2022-05-11 ENCOUNTER — Encounter: Payer: Self-pay | Admitting: *Deleted

## 2022-05-11 DIAGNOSIS — G894 Chronic pain syndrome: Secondary | ICD-10-CM | POA: Diagnosis not present

## 2022-05-11 DIAGNOSIS — N390 Urinary tract infection, site not specified: Secondary | ICD-10-CM | POA: Diagnosis not present

## 2022-05-11 DIAGNOSIS — E1122 Type 2 diabetes mellitus with diabetic chronic kidney disease: Secondary | ICD-10-CM | POA: Diagnosis not present

## 2022-05-11 DIAGNOSIS — N1832 Chronic kidney disease, stage 3b: Secondary | ICD-10-CM | POA: Diagnosis not present

## 2022-05-11 DIAGNOSIS — I129 Hypertensive chronic kidney disease with stage 1 through stage 4 chronic kidney disease, or unspecified chronic kidney disease: Secondary | ICD-10-CM | POA: Diagnosis not present

## 2022-05-11 DIAGNOSIS — L89153 Pressure ulcer of sacral region, stage 3: Secondary | ICD-10-CM | POA: Diagnosis not present

## 2022-05-11 NOTE — Patient Outreach (Signed)
  Care Coordination   Initial Visit Note   05/11/2022 Name: Anita [name] MRN: [id number] DOB: 1960/10/07  Anita [name] is a 61 y.o. year old female who sees Redmond School, MD for primary care. I spoke with  [name] by phone today.  What matters to the patients health and wellness today?  Receiving physical therapy for ambulation from [facility]. She uses a cane    [place], pcp's earliest hospital follow up is on 06/08/22   Diabetes "It is doing great" She states she is not taking insulin &  monitors diet  She reports her appetite is good, she is having no issues with elimination    Social   Transportation to medical appointments  Her husband had recent amputation of half of one of his feet Her daughter works  She likes for her daughter to take her to her pcp appointment to assist with asking questions and writing down information    Goals Addressed               This Visit's Progress     Patient Stated     manage medical transportation [facility]) (pt-stated)        Care Coordination Interventions: Evaluation of current treatment plan related to medical transportation and patient's adherence to plan as established by provider Provided education to patient re: the united [employer] medicare covered medical transportation benefits with the customer service number on back of her insurance card Reviewed scheduled/upcoming provider appointments including pcp hospital follow up  Discussed plans with patient for ongoing care management follow up and provided patient with direct contact information for care management team Screening for signs and symptoms of depression related to chronic disease state  Assessed social determinant of health barriers        SDOH assessments and interventions completed:  Yes  SDOH Interventions Today    Flowsheet Row Most Recent Value  SDOH Interventions   Food Insecurity Interventions Intervention Not Indicated  Housing  Interventions Intervention Not Indicated  Utilities Interventions Intervention Not Indicated  Stress Interventions Intervention Not Indicated        Care Coordination Interventions:  Yes, provided   Follow up plan: Follow up call scheduled for 06/07/22     Encounter Outcome:  Pt. Visit Completed   Lenice Koper L. Lavina Hamman, RN, BSN, [place] Coordinator Office number [phone]

## 2022-05-11 NOTE — Patient Instructions (Addendum)
Visit Information  Thank you for taking time to visit with me today. Please don't hesitate to contact me if I can be of assistance to you.   Following are the goals we discussed today:   Goals Addressed   None     Our next appointment is by telephone on 06/07/2022 at 1100  Please call the care guide team at [phone] if you need to cancel or reschedule your appointment.   If you are experiencing a Mental Health or [place] or need someone to talk to, please call the Suicide and Crisis Lifeline: 988 call the [place] National Suicide Prevention Lifeline: 6810371679 or TTY: 667-082-6316 TTY 864-771-3132) to talk to a trained counselor call 1-800-273-TALK (toll free, 24 hour hotline) call the [employer] [facility]: 251-365-0709 call 911   The patient verbalized understanding of instructions, educational materials, and care plan provided today and DECLINED offer to receive copy of patient instructions, educational materials, and care plan.   The patient has been provided with contact information for the care management team and has been advised to call with any health related questions or concerns.   Dick Hark L. Lavina Hamman, RN, BSN, [place] Coordinator Office number [phone]

## 2022-05-19 DIAGNOSIS — G894 Chronic pain syndrome: Secondary | ICD-10-CM | POA: Diagnosis not present

## 2022-05-19 DIAGNOSIS — N1832 Chronic kidney disease, stage 3b: Secondary | ICD-10-CM | POA: Diagnosis not present

## 2022-05-19 DIAGNOSIS — N390 Urinary tract infection, site not specified: Secondary | ICD-10-CM | POA: Diagnosis not present

## 2022-05-19 DIAGNOSIS — E1122 Type 2 diabetes mellitus with diabetic chronic kidney disease: Secondary | ICD-10-CM | POA: Diagnosis not present

## 2022-05-19 DIAGNOSIS — I129 Hypertensive chronic kidney disease with stage 1 through stage 4 chronic kidney disease, or unspecified chronic kidney disease: Secondary | ICD-10-CM | POA: Diagnosis not present

## 2022-05-19 DIAGNOSIS — L89153 Pressure ulcer of sacral region, stage 3: Secondary | ICD-10-CM | POA: Diagnosis not present

## 2022-05-20 DIAGNOSIS — I10 Essential (primary) hypertension: Secondary | ICD-10-CM | POA: Diagnosis not present

## 2022-05-20 DIAGNOSIS — G894 Chronic pain syndrome: Secondary | ICD-10-CM | POA: Diagnosis not present

## 2022-05-20 DIAGNOSIS — N1832 Chronic kidney disease, stage 3b: Secondary | ICD-10-CM | POA: Diagnosis not present

## 2022-05-20 DIAGNOSIS — R55 Syncope and collapse: Secondary | ICD-10-CM | POA: Diagnosis not present

## 2022-05-20 DIAGNOSIS — L89153 Pressure ulcer of sacral region, stage 3: Secondary | ICD-10-CM | POA: Diagnosis not present

## 2022-05-20 DIAGNOSIS — A419 Sepsis, unspecified organism: Secondary | ICD-10-CM | POA: Diagnosis not present

## 2022-05-20 DIAGNOSIS — Z6822 Body mass index (BMI) 22.0-22.9, adult: Secondary | ICD-10-CM | POA: Diagnosis not present

## 2022-05-20 DIAGNOSIS — E1122 Type 2 diabetes mellitus with diabetic chronic kidney disease: Secondary | ICD-10-CM | POA: Diagnosis not present

## 2022-05-20 DIAGNOSIS — I129 Hypertensive chronic kidney disease with stage 1 through stage 4 chronic kidney disease, or unspecified chronic kidney disease: Secondary | ICD-10-CM | POA: Diagnosis not present

## 2022-05-20 DIAGNOSIS — N390 Urinary tract infection, site not specified: Secondary | ICD-10-CM | POA: Diagnosis not present

## 2022-05-20 DIAGNOSIS — E1140 Type 2 diabetes mellitus with diabetic neuropathy, unspecified: Secondary | ICD-10-CM | POA: Diagnosis not present

## 2022-05-20 DIAGNOSIS — E1165 Type 2 diabetes mellitus with hyperglycemia: Secondary | ICD-10-CM | POA: Diagnosis not present

## 2022-05-26 DIAGNOSIS — I129 Hypertensive chronic kidney disease with stage 1 through stage 4 chronic kidney disease, or unspecified chronic kidney disease: Secondary | ICD-10-CM | POA: Diagnosis not present

## 2022-05-26 DIAGNOSIS — N390 Urinary tract infection, site not specified: Secondary | ICD-10-CM | POA: Diagnosis not present

## 2022-05-26 DIAGNOSIS — L89153 Pressure ulcer of sacral region, stage 3: Secondary | ICD-10-CM | POA: Diagnosis not present

## 2022-05-26 DIAGNOSIS — N1832 Chronic kidney disease, stage 3b: Secondary | ICD-10-CM | POA: Diagnosis not present

## 2022-05-26 DIAGNOSIS — E1122 Type 2 diabetes mellitus with diabetic chronic kidney disease: Secondary | ICD-10-CM | POA: Diagnosis not present

## 2022-05-26 DIAGNOSIS — G894 Chronic pain syndrome: Secondary | ICD-10-CM | POA: Diagnosis not present

## 2022-05-28 DIAGNOSIS — N1832 Chronic kidney disease, stage 3b: Secondary | ICD-10-CM | POA: Diagnosis not present

## 2022-05-28 DIAGNOSIS — I129 Hypertensive chronic kidney disease with stage 1 through stage 4 chronic kidney disease, or unspecified chronic kidney disease: Secondary | ICD-10-CM | POA: Diagnosis not present

## 2022-05-28 DIAGNOSIS — L89153 Pressure ulcer of sacral region, stage 3: Secondary | ICD-10-CM | POA: Diagnosis not present

## 2022-05-28 DIAGNOSIS — N390 Urinary tract infection, site not specified: Secondary | ICD-10-CM | POA: Diagnosis not present

## 2022-05-28 DIAGNOSIS — E1122 Type 2 diabetes mellitus with diabetic chronic kidney disease: Secondary | ICD-10-CM | POA: Diagnosis not present

## 2022-05-28 DIAGNOSIS — G894 Chronic pain syndrome: Secondary | ICD-10-CM | POA: Diagnosis not present

## 2022-06-02 DIAGNOSIS — L89153 Pressure ulcer of sacral region, stage 3: Secondary | ICD-10-CM | POA: Diagnosis not present

## 2022-06-02 DIAGNOSIS — N390 Urinary tract infection, site not specified: Secondary | ICD-10-CM | POA: Diagnosis not present

## 2022-06-02 DIAGNOSIS — G894 Chronic pain syndrome: Secondary | ICD-10-CM | POA: Diagnosis not present

## 2022-06-02 DIAGNOSIS — N1832 Chronic kidney disease, stage 3b: Secondary | ICD-10-CM | POA: Diagnosis not present

## 2022-06-02 DIAGNOSIS — E1122 Type 2 diabetes mellitus with diabetic chronic kidney disease: Secondary | ICD-10-CM | POA: Diagnosis not present

## 2022-06-02 DIAGNOSIS — I129 Hypertensive chronic kidney disease with stage 1 through stage 4 chronic kidney disease, or unspecified chronic kidney disease: Secondary | ICD-10-CM | POA: Diagnosis not present

## 2022-06-04 DIAGNOSIS — I129 Hypertensive chronic kidney disease with stage 1 through stage 4 chronic kidney disease, or unspecified chronic kidney disease: Secondary | ICD-10-CM | POA: Diagnosis not present

## 2022-06-04 DIAGNOSIS — E1122 Type 2 diabetes mellitus with diabetic chronic kidney disease: Secondary | ICD-10-CM | POA: Diagnosis not present

## 2022-06-04 DIAGNOSIS — N1832 Chronic kidney disease, stage 3b: Secondary | ICD-10-CM | POA: Diagnosis not present

## 2022-06-04 DIAGNOSIS — G894 Chronic pain syndrome: Secondary | ICD-10-CM | POA: Diagnosis not present

## 2022-06-04 DIAGNOSIS — L89153 Pressure ulcer of sacral region, stage 3: Secondary | ICD-10-CM | POA: Diagnosis not present

## 2022-06-04 DIAGNOSIS — N390 Urinary tract infection, site not specified: Secondary | ICD-10-CM | POA: Diagnosis not present

## 2022-06-07 ENCOUNTER — Ambulatory Visit: Payer: Self-pay | Admitting: *Deleted

## 2022-06-07 ENCOUNTER — Encounter: Payer: Medicare Other | Admitting: *Deleted

## 2022-06-07 ENCOUNTER — Encounter: Payer: Self-pay | Admitting: *Deleted

## 2022-06-07 NOTE — Patient Outreach (Signed)
  Care Coordination   06/07/2022 Name: Anita [name] MRN: [id number] DOB: 25-Dec-1960   Care Coordination Outreach Attempts:  An unsuccessful telephone outreach was attempted for a scheduled appointment today.  Follow Up Plan:  Additional outreach attempts will be made to offer the patient care coordination information and services.   Encounter Outcome:  No Answer   Care Coordination Interventions:  No, not indicated    Chong Sicilian, BSN, RN-BC RN Care Coordinator [place]: [phone] Main #: [phone]

## 2022-06-09 ENCOUNTER — Telehealth: Payer: Self-pay | Admitting: *Deleted

## 2022-06-09 DIAGNOSIS — E1122 Type 2 diabetes mellitus with diabetic chronic kidney disease: Secondary | ICD-10-CM | POA: Diagnosis not present

## 2022-06-09 DIAGNOSIS — I129 Hypertensive chronic kidney disease with stage 1 through stage 4 chronic kidney disease, or unspecified chronic kidney disease: Secondary | ICD-10-CM | POA: Diagnosis not present

## 2022-06-09 DIAGNOSIS — G894 Chronic pain syndrome: Secondary | ICD-10-CM | POA: Diagnosis not present

## 2022-06-09 DIAGNOSIS — N390 Urinary tract infection, site not specified: Secondary | ICD-10-CM | POA: Diagnosis not present

## 2022-06-09 DIAGNOSIS — N1832 Chronic kidney disease, stage 3b: Secondary | ICD-10-CM | POA: Diagnosis not present

## 2022-06-09 DIAGNOSIS — L89153 Pressure ulcer of sacral region, stage 3: Secondary | ICD-10-CM | POA: Diagnosis not present

## 2022-06-09 NOTE — Progress Notes (Signed)
  Care Coordination Note  06/09/2022 Name: GUELDA BATSON MRN: [id number] DOB: 10-26-1960  Georgia Dom Deschepper is a 62 y.o. year old female who is a primary care patient of [employer], MD and is actively engaged with the care management team. I reached out to [name] by phone today to assist with re-scheduling a follow up visit with the RN Case Manager  Follow up plan: A unsuccessful telephone outreach attempt made. A HIPAA compliant phone message was left for the patient providing contact information and requesting a return call.   [place]  Direct Dial: [phone]

## 2022-06-14 ENCOUNTER — Other Ambulatory Visit (HOSPITAL_COMMUNITY): Payer: Self-pay | Admitting: Internal Medicine

## 2022-06-14 DIAGNOSIS — D518 Other vitamin B12 deficiency anemias: Secondary | ICD-10-CM | POA: Diagnosis not present

## 2022-06-14 DIAGNOSIS — E039 Hypothyroidism, unspecified: Secondary | ICD-10-CM | POA: Diagnosis not present

## 2022-06-14 DIAGNOSIS — I129 Hypertensive chronic kidney disease with stage 1 through stage 4 chronic kidney disease, or unspecified chronic kidney disease: Secondary | ICD-10-CM | POA: Diagnosis not present

## 2022-06-14 DIAGNOSIS — R269 Unspecified abnormalities of gait and mobility: Secondary | ICD-10-CM

## 2022-06-14 DIAGNOSIS — Z6821 Body mass index (BMI) 21.0-21.9, adult: Secondary | ICD-10-CM | POA: Diagnosis not present

## 2022-06-14 DIAGNOSIS — E1122 Type 2 diabetes mellitus with diabetic chronic kidney disease: Secondary | ICD-10-CM | POA: Diagnosis not present

## 2022-06-14 DIAGNOSIS — Z0000 Encounter for general adult medical examination without abnormal findings: Secondary | ICD-10-CM | POA: Diagnosis not present

## 2022-06-14 DIAGNOSIS — G894 Chronic pain syndrome: Secondary | ICD-10-CM | POA: Diagnosis not present

## 2022-06-14 DIAGNOSIS — E559 Vitamin D deficiency, unspecified: Secondary | ICD-10-CM | POA: Diagnosis not present

## 2022-06-14 DIAGNOSIS — R413 Other amnesia: Secondary | ICD-10-CM

## 2022-06-14 DIAGNOSIS — E1140 Type 2 diabetes mellitus with diabetic neuropathy, unspecified: Secondary | ICD-10-CM | POA: Diagnosis not present

## 2022-06-14 DIAGNOSIS — N1832 Chronic kidney disease, stage 3b: Secondary | ICD-10-CM | POA: Diagnosis not present

## 2022-06-14 DIAGNOSIS — Z0001 Encounter for general adult medical examination with abnormal findings: Secondary | ICD-10-CM | POA: Diagnosis not present

## 2022-06-14 DIAGNOSIS — I10 Essential (primary) hypertension: Secondary | ICD-10-CM | POA: Diagnosis not present

## 2022-06-15 DIAGNOSIS — G894 Chronic pain syndrome: Secondary | ICD-10-CM | POA: Diagnosis not present

## 2022-06-15 DIAGNOSIS — I129 Hypertensive chronic kidney disease with stage 1 through stage 4 chronic kidney disease, or unspecified chronic kidney disease: Secondary | ICD-10-CM | POA: Diagnosis not present

## 2022-06-15 DIAGNOSIS — N1832 Chronic kidney disease, stage 3b: Secondary | ICD-10-CM | POA: Diagnosis not present

## 2022-06-15 DIAGNOSIS — N390 Urinary tract infection, site not specified: Secondary | ICD-10-CM | POA: Diagnosis not present

## 2022-06-15 DIAGNOSIS — E1122 Type 2 diabetes mellitus with diabetic chronic kidney disease: Secondary | ICD-10-CM | POA: Diagnosis not present

## 2022-06-15 DIAGNOSIS — L89153 Pressure ulcer of sacral region, stage 3: Secondary | ICD-10-CM | POA: Diagnosis not present

## 2022-06-15 NOTE — Progress Notes (Signed)
  Care Coordination Note  06/15/2022 Name: Anita [name] MRN: [id number] DOB: 12/19/1960  Anita [name] is a 62 y.o. year old female who is a primary care patient of [employer], MD and is actively engaged with the care management team. I reached out to [name] by phone today to assist with re-scheduling a follow up visit with the RN Case Manager  Follow up plan: A second unsuccessful telephone outreach attempt made. A HIPAA compliant phone message was left for the patient providing contact information and requesting a return call.   [place]  Direct Dial: [phone]

## 2022-06-17 NOTE — Progress Notes (Signed)
  Care Coordination Note  06/17/2022 Name: Anita [name] MRN: [id number] DOB: 1960/09/04  [name] is a 62 y.o. year old female who is a primary care patient of [employer], MD and is actively engaged with the care management team. I reached out to [name] by phone today to assist with re-scheduling a follow up visit with the RN Case Manager  Follow up plan: A third unsuccessful telephone outreach attempt made. A HIPAA compliant phone message was left for the patient providing contact information and requesting a return call. We have been unable to make contact with the patient for follow up. The care management team is available to follow up with the patient after provider conversation with the patient regarding recommendation for care management engagement and subsequent re-referral to the care management team.  [place]  Direct Dial: [phone]

## 2022-06-21 DIAGNOSIS — I129 Hypertensive chronic kidney disease with stage 1 through stage 4 chronic kidney disease, or unspecified chronic kidney disease: Secondary | ICD-10-CM | POA: Diagnosis not present

## 2022-06-21 DIAGNOSIS — L89153 Pressure ulcer of sacral region, stage 3: Secondary | ICD-10-CM | POA: Diagnosis not present

## 2022-06-21 DIAGNOSIS — N1832 Chronic kidney disease, stage 3b: Secondary | ICD-10-CM | POA: Diagnosis not present

## 2022-06-21 DIAGNOSIS — E1122 Type 2 diabetes mellitus with diabetic chronic kidney disease: Secondary | ICD-10-CM | POA: Diagnosis not present

## 2022-06-21 DIAGNOSIS — G894 Chronic pain syndrome: Secondary | ICD-10-CM | POA: Diagnosis not present

## 2022-06-21 DIAGNOSIS — N390 Urinary tract infection, site not specified: Secondary | ICD-10-CM | POA: Diagnosis not present

## 2022-07-20 DIAGNOSIS — I2510 Atherosclerotic heart disease of native coronary artery without angina pectoris: Secondary | ICD-10-CM | POA: Diagnosis not present

## 2022-07-20 DIAGNOSIS — E1122 Type 2 diabetes mellitus with diabetic chronic kidney disease: Secondary | ICD-10-CM | POA: Diagnosis not present

## 2022-07-20 DIAGNOSIS — I700 Atherosclerosis of aorta: Secondary | ICD-10-CM | POA: Diagnosis not present

## 2022-07-20 DIAGNOSIS — N1832 Chronic kidney disease, stage 3b: Secondary | ICD-10-CM | POA: Diagnosis not present

## 2022-07-20 DIAGNOSIS — E1140 Type 2 diabetes mellitus with diabetic neuropathy, unspecified: Secondary | ICD-10-CM | POA: Diagnosis not present

## 2022-07-20 DIAGNOSIS — I129 Hypertensive chronic kidney disease with stage 1 through stage 4 chronic kidney disease, or unspecified chronic kidney disease: Secondary | ICD-10-CM | POA: Diagnosis not present

## 2022-07-20 DIAGNOSIS — G894 Chronic pain syndrome: Secondary | ICD-10-CM | POA: Diagnosis not present

## 2022-07-20 DIAGNOSIS — E1165 Type 2 diabetes mellitus with hyperglycemia: Secondary | ICD-10-CM | POA: Diagnosis not present

## 2022-07-20 DIAGNOSIS — Z6820 Body mass index (BMI) 20.0-20.9, adult: Secondary | ICD-10-CM | POA: Diagnosis not present

## 2022-07-20 DIAGNOSIS — R269 Unspecified abnormalities of gait and mobility: Secondary | ICD-10-CM | POA: Diagnosis not present

## 2022-08-02 DIAGNOSIS — N1832 Chronic kidney disease, stage 3b: Secondary | ICD-10-CM | POA: Diagnosis not present

## 2022-08-02 DIAGNOSIS — I129 Hypertensive chronic kidney disease with stage 1 through stage 4 chronic kidney disease, or unspecified chronic kidney disease: Secondary | ICD-10-CM | POA: Diagnosis not present

## 2022-08-02 DIAGNOSIS — N200 Calculus of kidney: Secondary | ICD-10-CM | POA: Diagnosis not present

## 2022-08-02 DIAGNOSIS — N179 Acute kidney failure, unspecified: Secondary | ICD-10-CM | POA: Diagnosis not present

## 2022-08-05 ENCOUNTER — Other Ambulatory Visit (HOSPITAL_COMMUNITY): Payer: Self-pay | Admitting: Nephrology

## 2022-08-05 DIAGNOSIS — N179 Acute kidney failure, unspecified: Secondary | ICD-10-CM

## 2022-08-05 DIAGNOSIS — N1832 Chronic kidney disease, stage 3b: Secondary | ICD-10-CM

## 2022-08-06 ENCOUNTER — Ambulatory Visit
Admission: RE | Admit: 2022-08-06 | Discharge: 2022-08-06 | Disposition: A | Discharge status: Home / Self Care | Payer: Medicare Other | Source: Ambulatory Visit | Attending: Internal Medicine | Admitting: Internal Medicine

## 2022-08-06 DIAGNOSIS — Z1231 Encounter for screening mammogram for malignant neoplasm of breast: Secondary | ICD-10-CM | POA: Diagnosis not present

## 2022-08-12 ENCOUNTER — Ambulatory Visit (HOSPITAL_COMMUNITY): Admission: RE | Admit: 2022-08-12 | Payer: Medicare Other | Source: Ambulatory Visit

## 2022-08-17 DIAGNOSIS — N189 Chronic kidney disease, unspecified: Secondary | ICD-10-CM | POA: Diagnosis not present

## 2022-08-17 DIAGNOSIS — G894 Chronic pain syndrome: Secondary | ICD-10-CM | POA: Diagnosis not present

## 2022-08-17 DIAGNOSIS — E1165 Type 2 diabetes mellitus with hyperglycemia: Secondary | ICD-10-CM | POA: Diagnosis not present

## 2022-08-17 DIAGNOSIS — I10 Essential (primary) hypertension: Secondary | ICD-10-CM | POA: Diagnosis not present

## 2022-08-17 DIAGNOSIS — R269 Unspecified abnormalities of gait and mobility: Secondary | ICD-10-CM | POA: Diagnosis not present

## 2022-08-17 DIAGNOSIS — I700 Atherosclerosis of aorta: Secondary | ICD-10-CM | POA: Diagnosis not present

## 2022-08-17 DIAGNOSIS — E1140 Type 2 diabetes mellitus with diabetic neuropathy, unspecified: Secondary | ICD-10-CM | POA: Diagnosis not present

## 2022-08-17 DIAGNOSIS — I2510 Atherosclerotic heart disease of native coronary artery without angina pectoris: Secondary | ICD-10-CM | POA: Diagnosis not present

## 2022-08-17 DIAGNOSIS — Z6820 Body mass index (BMI) 20.0-20.9, adult: Secondary | ICD-10-CM | POA: Diagnosis not present

## 2022-08-17 DIAGNOSIS — I129 Hypertensive chronic kidney disease with stage 1 through stage 4 chronic kidney disease, or unspecified chronic kidney disease: Secondary | ICD-10-CM | POA: Diagnosis not present

## 2022-08-17 DIAGNOSIS — N1832 Chronic kidney disease, stage 3b: Secondary | ICD-10-CM | POA: Diagnosis not present

## 2022-08-23 ENCOUNTER — Encounter (HOSPITAL_COMMUNITY): Payer: Self-pay

## 2022-08-23 ENCOUNTER — Ambulatory Visit (HOSPITAL_COMMUNITY): Payer: Medicare Other | Attending: Nephrology

## 2022-09-20 DIAGNOSIS — I700 Atherosclerosis of aorta: Secondary | ICD-10-CM | POA: Diagnosis not present

## 2022-09-20 DIAGNOSIS — I10 Essential (primary) hypertension: Secondary | ICD-10-CM | POA: Diagnosis not present

## 2022-09-20 DIAGNOSIS — E1140 Type 2 diabetes mellitus with diabetic neuropathy, unspecified: Secondary | ICD-10-CM | POA: Diagnosis not present

## 2022-09-20 DIAGNOSIS — G894 Chronic pain syndrome: Secondary | ICD-10-CM | POA: Diagnosis not present

## 2022-09-20 DIAGNOSIS — I129 Hypertensive chronic kidney disease with stage 1 through stage 4 chronic kidney disease, or unspecified chronic kidney disease: Secondary | ICD-10-CM | POA: Diagnosis not present

## 2022-09-20 DIAGNOSIS — N1832 Chronic kidney disease, stage 3b: Secondary | ICD-10-CM | POA: Diagnosis not present

## 2022-09-20 DIAGNOSIS — Z6820 Body mass index (BMI) 20.0-20.9, adult: Secondary | ICD-10-CM | POA: Diagnosis not present

## 2022-10-14 DIAGNOSIS — E1122 Type 2 diabetes mellitus with diabetic chronic kidney disease: Secondary | ICD-10-CM | POA: Diagnosis not present

## 2022-10-14 DIAGNOSIS — N1832 Chronic kidney disease, stage 3b: Secondary | ICD-10-CM | POA: Diagnosis not present

## 2022-10-14 DIAGNOSIS — I129 Hypertensive chronic kidney disease with stage 1 through stage 4 chronic kidney disease, or unspecified chronic kidney disease: Secondary | ICD-10-CM | POA: Diagnosis not present

## 2022-10-14 DIAGNOSIS — R300 Dysuria: Secondary | ICD-10-CM | POA: Diagnosis not present

## 2022-10-14 DIAGNOSIS — N390 Urinary tract infection, site not specified: Secondary | ICD-10-CM | POA: Diagnosis not present

## 2022-10-14 DIAGNOSIS — I10 Essential (primary) hypertension: Secondary | ICD-10-CM | POA: Diagnosis not present

## 2022-10-14 DIAGNOSIS — E1140 Type 2 diabetes mellitus with diabetic neuropathy, unspecified: Secondary | ICD-10-CM | POA: Diagnosis not present

## 2022-10-14 DIAGNOSIS — I700 Atherosclerosis of aorta: Secondary | ICD-10-CM | POA: Diagnosis not present

## 2022-10-14 DIAGNOSIS — G894 Chronic pain syndrome: Secondary | ICD-10-CM | POA: Diagnosis not present

## 2022-10-14 DIAGNOSIS — Z681 Body mass index (BMI) 19 or less, adult: Secondary | ICD-10-CM | POA: Diagnosis not present

## 2022-11-11 DIAGNOSIS — K5732 Diverticulitis of large intestine without perforation or abscess without bleeding: Secondary | ICD-10-CM | POA: Diagnosis not present

## 2022-11-11 DIAGNOSIS — I700 Atherosclerosis of aorta: Secondary | ICD-10-CM | POA: Diagnosis not present

## 2022-11-11 DIAGNOSIS — G894 Chronic pain syndrome: Secondary | ICD-10-CM | POA: Diagnosis not present

## 2022-11-11 DIAGNOSIS — E1140 Type 2 diabetes mellitus with diabetic neuropathy, unspecified: Secondary | ICD-10-CM | POA: Diagnosis not present

## 2022-12-01 ENCOUNTER — Ambulatory Visit: Payer: Medicare Other | Admitting: Nurse Practitioner
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
## 2022-12-09 DIAGNOSIS — I129 Hypertensive chronic kidney disease with stage 1 through stage 4 chronic kidney disease, or unspecified chronic kidney disease: Secondary | ICD-10-CM | POA: Diagnosis not present

## 2022-12-09 DIAGNOSIS — E1140 Type 2 diabetes mellitus with diabetic neuropathy, unspecified: Secondary | ICD-10-CM | POA: Diagnosis not present

## 2022-12-09 DIAGNOSIS — G894 Chronic pain syndrome: Secondary | ICD-10-CM | POA: Diagnosis not present

## 2022-12-09 DIAGNOSIS — Z681 Body mass index (BMI) 19 or less, adult: Secondary | ICD-10-CM | POA: Diagnosis not present

## 2022-12-09 DIAGNOSIS — E1165 Type 2 diabetes mellitus with hyperglycemia: Secondary | ICD-10-CM | POA: Diagnosis not present

## 2022-12-09 DIAGNOSIS — K529 Noninfective gastroenteritis and colitis, unspecified: Secondary | ICD-10-CM | POA: Diagnosis not present

## 2022-12-22 DIAGNOSIS — G894 Chronic pain syndrome: Secondary | ICD-10-CM | POA: Diagnosis not present

## 2022-12-22 DIAGNOSIS — I129 Hypertensive chronic kidney disease with stage 1 through stage 4 chronic kidney disease, or unspecified chronic kidney disease: Secondary | ICD-10-CM | POA: Diagnosis not present

## 2022-12-22 DIAGNOSIS — Z681 Body mass index (BMI) 19 or less, adult: Secondary | ICD-10-CM | POA: Diagnosis not present

## 2022-12-22 DIAGNOSIS — E1165 Type 2 diabetes mellitus with hyperglycemia: Secondary | ICD-10-CM | POA: Diagnosis not present

## 2022-12-22 DIAGNOSIS — E1140 Type 2 diabetes mellitus with diabetic neuropathy, unspecified: Secondary | ICD-10-CM | POA: Diagnosis not present
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
## 2023-01-11 ENCOUNTER — Ambulatory Visit: Payer: Medicare Other | Admitting: Adult Health

## 2023-01-11 ENCOUNTER — Encounter: Payer: Self-pay | Admitting: Adult Health

## 2023-01-11 VITALS — BP 130/60 | HR 59 | Ht 65.0 in | Wt 117.2 lb

## 2023-01-11 DIAGNOSIS — I10 Essential (primary) hypertension: Secondary | ICD-10-CM | POA: Diagnosis not present

## 2023-01-11 DIAGNOSIS — I2510 Atherosclerotic heart disease of native coronary artery without angina pectoris: Secondary | ICD-10-CM

## 2023-01-11 DIAGNOSIS — E7800 Pure hypercholesterolemia, unspecified: Secondary | ICD-10-CM

## 2023-01-11 DIAGNOSIS — E440 Moderate protein-calorie malnutrition: Secondary | ICD-10-CM

## 2023-01-11 DIAGNOSIS — I358 Other nonrheumatic aortic valve disorders: Secondary | ICD-10-CM

## 2023-01-11 MED ORDER — ATORVASTATIN CALCIUM 20 MG PO TABS: 20.0000 mg | ORAL_TABLET | Freq: Every day | ORAL | 11 refills | Status: DC

## 2023-01-11 MED ORDER — METOPROLOL TARTRATE 100 MG PO TABS: 100.0000 mg | ORAL_TABLET | Freq: Two times a day (BID) | ORAL | 11 refills | Status: DC

## 2023-01-11 NOTE — Patient Instructions (Signed)
Medication Instructions:  No Changes *If you need a refill on your cardiac medications before your next appointment, please call your pharmacy*   Lab Work: No Labs If you have labs (blood work) drawn today and your tests are completely normal, you will receive your results only by: MyChart Message (if you have MyChart) OR A paper copy in the mail If you have any lab test that is abnormal or we need to change your treatment, we will call you to review the results.   Testing/Procedures: No Testing   Follow-Up: At [facility], you and your health needs are our priority.  As part of our continuing mission to provide you with exceptional heart care, we have created designated Provider Care Teams.  These Care Teams include your primary Cardiologist (physician) and Advanced Practice Providers (APPs -  Physician Assistants and Nurse Practitioners) who all work together to provide you with the care you need, when you need it.  We recommend signing up for the patient portal called "MyChart".  Sign up information is provided on this After Visit Summary.  MyChart is used to connect with patients for Virtual Visits (Telemedicine).  Patients are able to view lab/test results, encounter notes, upcoming appointments, etc.  Non-urgent messages can be sent to your provider as well.   To learn more about what you can do with MyChart, go to [email or web address].    Your next appointment:   1 year(s)  Provider:   Thomas Kelly, MD   

## 2023-01-26 DIAGNOSIS — E1165 Type 2 diabetes mellitus with hyperglycemia: Secondary | ICD-10-CM | POA: Diagnosis not present

## 2023-01-26 DIAGNOSIS — Z681 Body mass index (BMI) 19 or less, adult: Secondary | ICD-10-CM | POA: Diagnosis not present

## 2023-01-26 DIAGNOSIS — I129 Hypertensive chronic kidney disease with stage 1 through stage 4 chronic kidney disease, or unspecified chronic kidney disease: Secondary | ICD-10-CM | POA: Diagnosis not present

## 2023-01-26 DIAGNOSIS — G894 Chronic pain syndrome: Secondary | ICD-10-CM | POA: Diagnosis not present

## 2023-01-26 DIAGNOSIS — E1140 Type 2 diabetes mellitus with diabetic neuropathy, unspecified: Secondary | ICD-10-CM | POA: Diagnosis not present

## 2023-02-12 DIAGNOSIS — K760 Fatty (change of) liver, not elsewhere classified: Secondary | ICD-10-CM | POA: Diagnosis present

## 2023-02-12 DIAGNOSIS — T426X5A Adverse effect of other antiepileptic and sedative-hypnotic drugs, initial encounter: Secondary | ICD-10-CM | POA: Diagnosis present

## 2023-02-12 DIAGNOSIS — Z90710 Acquired absence of both cervix and uterus: Secondary | ICD-10-CM

## 2023-02-12 DIAGNOSIS — Z794 Long term (current) use of insulin: Secondary | ICD-10-CM

## 2023-02-12 DIAGNOSIS — Z7984 Long term (current) use of oral hypoglycemic drugs: Secondary | ICD-10-CM

## 2023-02-12 DIAGNOSIS — E860 Dehydration: Secondary | ICD-10-CM | POA: Diagnosis not present

## 2023-02-12 DIAGNOSIS — Z7982 Long term (current) use of aspirin: Secondary | ICD-10-CM | POA: Diagnosis not present

## 2023-02-12 DIAGNOSIS — Z79899 Other long term (current) drug therapy: Secondary | ICD-10-CM

## 2023-02-12 DIAGNOSIS — I2510 Atherosclerotic heart disease of native coronary artery without angina pectoris: Secondary | ICD-10-CM | POA: Diagnosis not present

## 2023-02-12 DIAGNOSIS — J984 Other disorders of lung: Secondary | ICD-10-CM | POA: Diagnosis not present

## 2023-02-12 DIAGNOSIS — Z8249 Family history of ischemic heart disease and other diseases of the circulatory system: Secondary | ICD-10-CM

## 2023-02-12 DIAGNOSIS — T402X5A Adverse effect of other opioids, initial encounter: Secondary | ICD-10-CM | POA: Diagnosis present

## 2023-02-12 DIAGNOSIS — Z888 Allergy status to other drugs, medicaments and biological substances status: Secondary | ICD-10-CM

## 2023-02-12 DIAGNOSIS — M5442 Lumbago with sciatica, left side: Secondary | ICD-10-CM | POA: Diagnosis present

## 2023-02-12 DIAGNOSIS — Z823 Family history of stroke: Secondary | ICD-10-CM

## 2023-02-12 DIAGNOSIS — Z23 Encounter for immunization: Secondary | ICD-10-CM | POA: Diagnosis not present

## 2023-02-12 DIAGNOSIS — E1122 Type 2 diabetes mellitus with diabetic chronic kidney disease: Secondary | ICD-10-CM | POA: Diagnosis present

## 2023-02-12 DIAGNOSIS — E872 Acidosis, unspecified: Secondary | ICD-10-CM | POA: Diagnosis not present

## 2023-02-12 DIAGNOSIS — E1165 Type 2 diabetes mellitus with hyperglycemia: Secondary | ICD-10-CM | POA: Diagnosis present

## 2023-02-12 DIAGNOSIS — Z79890 Hormone replacement therapy (postmenopausal): Secondary | ICD-10-CM

## 2023-02-12 DIAGNOSIS — E1140 Type 2 diabetes mellitus with diabetic neuropathy, unspecified: Secondary | ICD-10-CM | POA: Diagnosis present

## 2023-02-12 DIAGNOSIS — I700 Atherosclerosis of aorta: Secondary | ICD-10-CM | POA: Diagnosis not present

## 2023-02-12 DIAGNOSIS — N1832 Chronic kidney disease, stage 3b: Secondary | ICD-10-CM | POA: Diagnosis present

## 2023-02-12 DIAGNOSIS — I129 Hypertensive chronic kidney disease with stage 1 through stage 4 chronic kidney disease, or unspecified chronic kidney disease: Secondary | ICD-10-CM | POA: Diagnosis present

## 2023-02-12 DIAGNOSIS — Z8673 Personal history of transient ischemic attack (TIA), and cerebral infarction without residual deficits: Secondary | ICD-10-CM | POA: Diagnosis not present

## 2023-02-12 DIAGNOSIS — R54 Age-related physical debility: Secondary | ICD-10-CM | POA: Diagnosis present

## 2023-02-12 DIAGNOSIS — Z833 Family history of diabetes mellitus: Secondary | ICD-10-CM

## 2023-02-12 DIAGNOSIS — N179 Acute kidney failure, unspecified: Principal | ICD-10-CM | POA: Diagnosis present

## 2023-02-12 DIAGNOSIS — E785 Hyperlipidemia, unspecified: Secondary | ICD-10-CM | POA: Diagnosis present

## 2023-02-12 DIAGNOSIS — N390 Urinary tract infection, site not specified: Secondary | ICD-10-CM | POA: Diagnosis not present

## 2023-02-12 DIAGNOSIS — Z87891 Personal history of nicotine dependence: Secondary | ICD-10-CM

## 2023-02-12 DIAGNOSIS — F419 Anxiety disorder, unspecified: Secondary | ICD-10-CM | POA: Diagnosis present

## 2023-02-12 DIAGNOSIS — B9620 Unspecified Escherichia coli [E. coli] as the cause of diseases classified elsewhere: Secondary | ICD-10-CM | POA: Diagnosis present

## 2023-02-12 DIAGNOSIS — T424X5A Adverse effect of benzodiazepines, initial encounter: Secondary | ICD-10-CM | POA: Diagnosis present

## 2023-02-12 DIAGNOSIS — Z880 Allergy status to penicillin: Secondary | ICD-10-CM

## 2023-02-12 DIAGNOSIS — G8921 Chronic pain due to trauma: Secondary | ICD-10-CM | POA: Diagnosis present

## 2023-02-12 DIAGNOSIS — A419 Sepsis, unspecified organism: Secondary | ICD-10-CM | POA: Diagnosis not present

## 2023-02-13 ENCOUNTER — Encounter (HOSPITAL_COMMUNITY): Payer: Self-pay | Admitting: Emergency Medicine

## 2023-02-13 ENCOUNTER — Inpatient Hospital Stay (HOSPITAL_COMMUNITY)
Admission: EM | Admit: 2023-02-13 | Discharge: 2023-02-15 | DRG: 683 | Disposition: A | Discharge status: Home / Self Care | Payer: Medicare Other | Attending: Family Medicine | Admitting: Family Medicine

## 2023-02-13 ENCOUNTER — Other Ambulatory Visit: Payer: Self-pay

## 2023-02-13 ENCOUNTER — Emergency Department (HOSPITAL_COMMUNITY): Payer: Medicare Other

## 2023-02-13 DIAGNOSIS — E860 Dehydration: Principal | ICD-10-CM

## 2023-02-13 DIAGNOSIS — N390 Urinary tract infection, site not specified: Secondary | ICD-10-CM | POA: Diagnosis present

## 2023-02-13 DIAGNOSIS — I129 Hypertensive chronic kidney disease with stage 1 through stage 4 chronic kidney disease, or unspecified chronic kidney disease: Secondary | ICD-10-CM | POA: Diagnosis present

## 2023-02-13 DIAGNOSIS — Z8249 Family history of ischemic heart disease and other diseases of the circulatory system: Secondary | ICD-10-CM | POA: Diagnosis not present

## 2023-02-13 DIAGNOSIS — K760 Fatty (change of) liver, not elsewhere classified: Secondary | ICD-10-CM | POA: Diagnosis present

## 2023-02-13 DIAGNOSIS — Z7984 Long term (current) use of oral hypoglycemic drugs: Secondary | ICD-10-CM | POA: Diagnosis not present

## 2023-02-13 DIAGNOSIS — E1140 Type 2 diabetes mellitus with diabetic neuropathy, unspecified: Secondary | ICD-10-CM | POA: Diagnosis present

## 2023-02-13 DIAGNOSIS — I700 Atherosclerosis of aorta: Secondary | ICD-10-CM | POA: Diagnosis not present

## 2023-02-13 DIAGNOSIS — E1122 Type 2 diabetes mellitus with diabetic chronic kidney disease: Secondary | ICD-10-CM | POA: Diagnosis present

## 2023-02-13 DIAGNOSIS — N179 Acute kidney failure, unspecified: Secondary | ICD-10-CM | POA: Diagnosis present

## 2023-02-13 DIAGNOSIS — B9620 Unspecified Escherichia coli [E. coli] as the cause of diseases classified elsewhere: Secondary | ICD-10-CM | POA: Diagnosis present

## 2023-02-13 DIAGNOSIS — Z720 Tobacco use: Secondary | ICD-10-CM | POA: Diagnosis present

## 2023-02-13 DIAGNOSIS — Z833 Family history of diabetes mellitus: Secondary | ICD-10-CM | POA: Diagnosis not present

## 2023-02-13 DIAGNOSIS — N1832 Chronic kidney disease, stage 3b: Secondary | ICD-10-CM | POA: Diagnosis not present

## 2023-02-13 DIAGNOSIS — Z79890 Hormone replacement therapy (postmenopausal): Secondary | ICD-10-CM | POA: Diagnosis not present

## 2023-02-13 DIAGNOSIS — G894 Chronic pain syndrome: Secondary | ICD-10-CM | POA: Diagnosis not present

## 2023-02-13 DIAGNOSIS — Z823 Family history of stroke: Secondary | ICD-10-CM | POA: Diagnosis not present

## 2023-02-13 DIAGNOSIS — E785 Hyperlipidemia, unspecified: Secondary | ICD-10-CM | POA: Diagnosis present

## 2023-02-13 DIAGNOSIS — N3000 Acute cystitis without hematuria: Secondary | ICD-10-CM

## 2023-02-13 DIAGNOSIS — Z794 Long term (current) use of insulin: Secondary | ICD-10-CM | POA: Diagnosis not present

## 2023-02-13 DIAGNOSIS — F419 Anxiety disorder, unspecified: Secondary | ICD-10-CM | POA: Diagnosis present

## 2023-02-13 DIAGNOSIS — I2510 Atherosclerotic heart disease of native coronary artery without angina pectoris: Secondary | ICD-10-CM | POA: Diagnosis present

## 2023-02-13 DIAGNOSIS — A419 Sepsis, unspecified organism: Secondary | ICD-10-CM | POA: Diagnosis not present

## 2023-02-13 DIAGNOSIS — G8921 Chronic pain due to trauma: Secondary | ICD-10-CM | POA: Diagnosis present

## 2023-02-13 DIAGNOSIS — E1165 Type 2 diabetes mellitus with hyperglycemia: Secondary | ICD-10-CM | POA: Diagnosis present

## 2023-02-13 DIAGNOSIS — G8929 Other chronic pain: Secondary | ICD-10-CM | POA: Diagnosis present

## 2023-02-13 DIAGNOSIS — I200 Unstable angina: Secondary | ICD-10-CM | POA: Diagnosis not present

## 2023-02-13 DIAGNOSIS — E872 Acidosis, unspecified: Secondary | ICD-10-CM | POA: Diagnosis present

## 2023-02-13 DIAGNOSIS — J984 Other disorders of lung: Secondary | ICD-10-CM | POA: Diagnosis not present

## 2023-02-13 DIAGNOSIS — Z8673 Personal history of transient ischemic attack (TIA), and cerebral infarction without residual deficits: Secondary | ICD-10-CM | POA: Diagnosis not present

## 2023-02-13 DIAGNOSIS — Z7982 Long term (current) use of aspirin: Secondary | ICD-10-CM | POA: Diagnosis not present

## 2023-02-13 DIAGNOSIS — Z23 Encounter for immunization: Secondary | ICD-10-CM | POA: Diagnosis not present

## 2023-02-13 DIAGNOSIS — I10 Essential (primary) hypertension: Secondary | ICD-10-CM | POA: Diagnosis present

## 2023-02-13 LAB — CBC WITH DIFFERENTIAL/PLATELET
Abs Immature Granulocytes: 0.05 10*3/uL (ref 0.00–0.07)
Basophils Absolute: 0 10*3/uL (ref 0.0–0.1)
Basophils Relative: 0 %
Eosinophils Absolute: 0.1 10*3/uL (ref 0.0–0.5)
Eosinophils Relative: 1 %
HCT: 32.7 % — ABNORMAL LOW (ref 36.0–46.0)
Hemoglobin: 10.3 g/dL — ABNORMAL LOW (ref 12.0–15.0)
Immature Granulocytes: 1 %
Lymphocytes Relative: 9 %
Lymphs Abs: 0.9 10*3/uL (ref 0.7–4.0)
MCH: 28.5 pg (ref 26.0–34.0)
MCHC: 31.5 g/dL (ref 30.0–36.0)
MCV: 90.6 fL (ref 80.0–100.0)
Monocytes Absolute: 0.8 10*3/uL (ref 0.1–1.0)
Monocytes Relative: 8 %
Neutro Abs: 8.5 10*3/uL — ABNORMAL HIGH (ref 1.7–7.7)
Neutrophils Relative %: 81 %
Platelets: 251 10*3/uL (ref 150–400)
RBC: 3.61 MIL/uL — ABNORMAL LOW (ref 3.87–5.11)
RDW: 13.7 % (ref 11.5–15.5)
WBC: 10.3 10*3/uL (ref 4.0–10.5)
nRBC: 0 % (ref 0.0–0.2)

## 2023-02-13 LAB — GLUCOSE, CAPILLARY
Glucose-Capillary: 36 mg/dL — CL (ref 70–99)
Glucose-Capillary: 140 mg/dL — ABNORMAL HIGH (ref 70–99)
Glucose-Capillary: 108 mg/dL — ABNORMAL HIGH (ref 70–99)
Glucose-Capillary: 86 mg/dL (ref 70–99)
Glucose-Capillary: 93 mg/dL (ref 70–99)
Glucose-Capillary: 224 mg/dL — ABNORMAL HIGH (ref 70–99)
Glucose-Capillary: 245 mg/dL — ABNORMAL HIGH (ref 70–99)
Glucose-Capillary: 140 mg/dL — ABNORMAL HIGH (ref 70–99)
Glucose-Capillary: 214 mg/dL — ABNORMAL HIGH (ref 70–99)

## 2023-02-13 LAB — URINALYSIS, W/ REFLEX TO CULTURE (INFECTION SUSPECTED)
Bilirubin Urine: NEGATIVE
Glucose, UA: NEGATIVE mg/dL
Hgb urine dipstick: NEGATIVE
Ketones, ur: NEGATIVE mg/dL
Nitrite: POSITIVE — AB
Protein, ur: 100 mg/dL — AB
Specific Gravity, Urine: 1.018 (ref 1.005–1.030)
pH: 5 (ref 5.0–8.0)

## 2023-02-13 LAB — COMPREHENSIVE METABOLIC PANEL
ALT: 14 U/L (ref 0–44)
AST: 17 U/L (ref 15–41)
Albumin: 3.4 g/dL — ABNORMAL LOW (ref 3.5–5.0)
Alkaline Phosphatase: 43 U/L (ref 38–126)
Anion gap: 13 (ref 5–15)
BUN: 47 mg/dL — ABNORMAL HIGH (ref 8–23)
CO2: 24 mmol/L (ref 22–32)
Calcium: 8.8 mg/dL — ABNORMAL LOW (ref 8.9–10.3)
Chloride: 97 mmol/L — ABNORMAL LOW (ref 98–111)
Creatinine, Ser: 1.88 mg/dL — ABNORMAL HIGH (ref 0.44–1.00)
GFR, Estimated: 30 mL/min — ABNORMAL LOW (ref >60)
Glucose, Bld: 133 mg/dL — ABNORMAL HIGH (ref 70–99)
Potassium: 4 mmol/L (ref 3.5–5.1)
Sodium: 134 mmol/L — ABNORMAL LOW (ref 135–145)
Total Bilirubin: 0.7 mg/dL (ref 0.3–1.2)
Total Protein: 6.8 g/dL (ref 6.5–8.1)

## 2023-02-13 LAB — LACTIC ACID, PLASMA
Lactic Acid, Venous: 3.4 mmol/L (ref 0.5–1.9)
Lactic Acid, Venous: 3.5 mmol/L (ref 0.5–1.9)
Lactic Acid, Venous: 1.2 mmol/L (ref 0.5–1.9)

## 2023-02-13 LAB — RAPID URINE DRUG SCREEN, HOSP PERFORMED
Amphetamines: NOT DETECTED
Barbiturates: NOT DETECTED
Benzodiazepines: POSITIVE — AB
Cocaine: NOT DETECTED
Opiates: POSITIVE — AB
Tetrahydrocannabinol: NOT DETECTED

## 2023-02-13 LAB — PROTIME-INR
INR: 1 (ref 0.8–1.2)
Prothrombin Time: 13.6 s (ref 11.4–15.2)

## 2023-02-13 MED ORDER — OXYCODONE HCL 5 MG PO TABS: 15.0000 mg | ORAL_TABLET | Freq: Four times a day (QID) | ORAL | Status: DC | PRN

## 2023-02-13 MED ORDER — ONDANSETRON HCL 4 MG/2ML IJ SOLN
4.0000 mg | Freq: Four times a day (QID) | INTRAMUSCULAR | Status: DC | PRN
  Administered 2023-02-14: 4 mg via INTRAVENOUS
  Filled 2023-02-13: qty 2

## 2023-02-13 MED ORDER — ISOSORBIDE MONONITRATE ER 60 MG PO TB24
30.0000 mg | ORAL_TABLET | Freq: Every day | ORAL | Status: DC
  Administered 2023-02-13 – 2023-02-14 (×2): 30 mg via ORAL
  Filled 2023-02-13 (×2): qty 1

## 2023-02-13 MED ORDER — LACTATED RINGERS IV BOLUS
1000.0000 mL | Freq: Once | INTRAVENOUS | Status: AC
  Administered 2023-02-13: 1000 mL via INTRAVENOUS

## 2023-02-13 MED ORDER — ATORVASTATIN CALCIUM 20 MG PO TABS
20.0000 mg | ORAL_TABLET | Freq: Every day | ORAL | Status: DC
  Administered 2023-02-13 – 2023-02-15 (×3): 20 mg via ORAL
  Filled 2023-02-13 (×3): qty 1

## 2023-02-13 MED ORDER — OXYCODONE HCL 5 MG PO TABS: 5.0000 mg | ORAL_TABLET | Freq: Four times a day (QID) | ORAL | Status: DC | PRN

## 2023-02-13 MED ORDER — ONDANSETRON HCL 4 MG PO TABS: 4.0000 mg | ORAL_TABLET | Freq: Four times a day (QID) | ORAL | Status: DC | PRN

## 2023-02-13 MED ORDER — PNEUMOCOCCAL 20-VAL CONJ VACC 0.5 ML IM SUSY
0.5000 mL | PREFILLED_SYRINGE | INTRAMUSCULAR | Status: AC
  Administered 2023-02-15: 0.5 mL via INTRAMUSCULAR
  Filled 2023-02-13: qty 0.5

## 2023-02-13 MED ORDER — INFLUENZA VIRUS VACC SPLIT PF (FLUZONE) 0.5 ML IM SUSY
0.5000 mL | PREFILLED_SYRINGE | INTRAMUSCULAR | Status: AC
  Administered 2023-02-15: 0.5 mL via INTRAMUSCULAR
  Filled 2023-02-13: qty 0.5

## 2023-02-13 MED ORDER — SODIUM CHLORIDE 0.9 % IV SOLN: INTRAVENOUS | Status: DC

## 2023-02-13 MED ORDER — ASPIRIN 81 MG PO TBEC
81.0000 mg | DELAYED_RELEASE_TABLET | Freq: Every day | ORAL | Status: DC
  Administered 2023-02-13 – 2023-02-15 (×3): 81 mg via ORAL
  Filled 2023-02-13 (×3): qty 1

## 2023-02-13 MED ORDER — HYDRALAZINE HCL 20 MG/ML IJ SOLN
10.0000 mg | INTRAMUSCULAR | Status: DC | PRN
  Administered 2023-02-13 – 2023-02-14 (×4): 10 mg via INTRAVENOUS
  Filled 2023-02-13 (×6): qty 1

## 2023-02-13 MED ORDER — BISACODYL 5 MG PO TBEC: 5.0000 mg | DELAYED_RELEASE_TABLET | Freq: Every day | ORAL | Status: DC | PRN

## 2023-02-13 MED ORDER — BUSPIRONE HCL 5 MG PO TABS
10.0000 mg | ORAL_TABLET | Freq: Two times a day (BID) | ORAL | Status: DC
  Administered 2023-02-14 – 2023-02-15 (×3): 10 mg via ORAL
  Filled 2023-02-13 (×3): qty 2

## 2023-02-13 MED ORDER — DEXTROSE 50 % IV SOLN
INTRAVENOUS | Status: AC
  Filled 2023-02-13: qty 50

## 2023-02-13 MED ORDER — FENTANYL CITRATE PF 50 MCG/ML IJ SOSY
25.0000 ug | PREFILLED_SYRINGE | INTRAMUSCULAR | Status: DC | PRN
  Administered 2023-02-14 – 2023-02-15 (×2): 25 ug via INTRAVENOUS
  Filled 2023-02-13 (×2): qty 1

## 2023-02-13 MED ORDER — SODIUM CHLORIDE 0.9 % IV SOLN
1.0000 g | Freq: Once | INTRAVENOUS | Status: AC
  Administered 2023-02-13: 1 g via INTRAVENOUS
  Filled 2023-02-13: qty 10

## 2023-02-13 MED ORDER — PANTOPRAZOLE SODIUM 40 MG PO TBEC
40.0000 mg | DELAYED_RELEASE_TABLET | Freq: Every day | ORAL | Status: DC
  Administered 2023-02-13 – 2023-02-15 (×3): 40 mg via ORAL
  Filled 2023-02-13 (×3): qty 1

## 2023-02-13 MED ORDER — ACETAMINOPHEN 650 MG RE SUPP: 650.0000 mg | Freq: Four times a day (QID) | RECTAL | Status: DC | PRN

## 2023-02-13 MED ORDER — INSULIN ASPART 100 UNIT/ML IJ SOLN
0.0000 [IU] | Freq: Three times a day (TID) | INTRAMUSCULAR | Status: DC
  Administered 2023-02-13: 3 [IU] via SUBCUTANEOUS
  Administered 2023-02-14: 2 [IU] via SUBCUTANEOUS
  Administered 2023-02-14: 5 [IU] via SUBCUTANEOUS
  Administered 2023-02-14: 3 [IU] via SUBCUTANEOUS
  Administered 2023-02-15 (×2): 2 [IU] via SUBCUTANEOUS

## 2023-02-13 MED ORDER — TRAZODONE HCL 50 MG PO TABS
25.0000 mg | ORAL_TABLET | Freq: Every evening | ORAL | Status: DC | PRN
  Filled 2023-02-13: qty 1

## 2023-02-13 MED ORDER — DEXTROSE 50 % IV SOLN: 1.0000 | Freq: Once | INTRAVENOUS | Status: AC

## 2023-02-13 MED ORDER — METOPROLOL TARTRATE 50 MG PO TABS
100.0000 mg | ORAL_TABLET | Freq: Two times a day (BID) | ORAL | Status: DC
  Administered 2023-02-13 – 2023-02-15 (×5): 100 mg via ORAL
  Filled 2023-02-13 (×5): qty 2

## 2023-02-13 MED ORDER — OXYCODONE HCL 5 MG PO TABS: 10.0000 mg | ORAL_TABLET | Freq: Four times a day (QID) | ORAL | Status: DC | PRN

## 2023-02-13 MED ORDER — ALPRAZOLAM 0.5 MG PO TABS: 0.5000 mg | ORAL_TABLET | Freq: Three times a day (TID) | ORAL | Status: DC | PRN

## 2023-02-13 MED ORDER — ALPRAZOLAM 0.25 MG PO TABS
0.2500 mg | ORAL_TABLET | Freq: Three times a day (TID) | ORAL | Status: DC | PRN
  Filled 2023-02-13: qty 1

## 2023-02-13 MED ORDER — FENTANYL CITRATE PF 50 MCG/ML IJ SOSY: 12.5000 ug | PREFILLED_SYRINGE | INTRAMUSCULAR | Status: DC | PRN

## 2023-02-13 MED ORDER — INSULIN ASPART 100 UNIT/ML IJ SOLN: 3.0000 [IU] | Freq: Three times a day (TID) | INTRAMUSCULAR | Status: DC

## 2023-02-13 MED ORDER — SODIUM CHLORIDE 0.9 % IV SOLN
2.0000 g | INTRAVENOUS | Status: AC
  Administered 2023-02-14 – 2023-02-15 (×2): 2 g via INTRAVENOUS
  Filled 2023-02-13: qty 20

## 2023-02-13 MED ORDER — BUSPIRONE HCL 5 MG PO TABS: 10.0000 mg | ORAL_TABLET | Freq: Two times a day (BID) | ORAL | Status: DC

## 2023-02-13 MED ORDER — HEPARIN SODIUM (PORCINE) 5000 UNIT/ML IJ SOLN
5000.0000 [IU] | Freq: Three times a day (TID) | INTRAMUSCULAR | Status: DC
  Administered 2023-02-13 – 2023-02-15 (×6): 5000 [IU] via SUBCUTANEOUS
  Filled 2023-02-13 (×7): qty 1

## 2023-02-13 MED ORDER — ACETAMINOPHEN 325 MG PO TABS
650.0000 mg | ORAL_TABLET | Freq: Four times a day (QID) | ORAL | Status: DC | PRN
  Administered 2023-02-14: 650 mg via ORAL
  Filled 2023-02-13: qty 2

## 2023-02-13 MED ORDER — INSULIN ASPART 100 UNIT/ML IJ SOLN: 2.0000 [IU] | Freq: Three times a day (TID) | INTRAMUSCULAR | Status: DC

## 2023-02-13 NOTE — TOC CM/SW Note (Signed)
Transition of Care [facility]) - Inpatient Brief Assessment   Patient Details  Name: Anita [name] MRN: [id number] Date of Birth: Oct 11, 1960  Transition of Care [facility]) CM/SW Contact:    Villa Herb, LCSWA Phone Number: 02/13/2023, 11:43 AM   Clinical Narrative: Transition of Care Department [facility]) has reviewed patient and no TOC needs have been identified at this time. We will continue to monitor patient advancement through interdisciplinary progression rounds. If new patient transition needs arise, please place a TOC consult.  Transition of Care Asessment: Insurance and Status: Insurance coverage has been reviewed Patient has primary care physician: Yes Home environment has been reviewed: return home Prior level of function:: independent Prior/Current Home Services: No current home services Social Determinants of Health Reivew: SDOH reviewed no interventions necessary Readmission risk has been reviewed: Yes Transition of care needs: no transition of care needs at this time

## 2023-02-13 NOTE — Progress Notes (Signed)
Pt found in the bathroom. Pt states ED staff put her in the bathroom. (No one told this nurse or tech) Vitals obtained BP 200/76 Map 110 P 75 r 16 o2 100 on RA. CBG obtained and it was 36. Notified MD about BP and CBG There is currently no antihypertensives in her MAR. Awaiting orders.This nurse ordered d50 to bring up CBG levels.

## 2023-02-13 NOTE — ED Notes (Signed)
Pt attempted to give urine sample but did not make it in the hat. Will try again in a little bit. RN notified

## 2023-02-13 NOTE — ED Triage Notes (Signed)
Pt with increased weakness and dizziness x 2-3 days. Husband states that at times, he cannot understand pts' speech.

## 2023-02-13 NOTE — ED Provider Notes (Signed)
[place] EMERGENCY DEPARTMENT AT [facility] Provider Note   CSN: [id number] Arrival date & time: 02/12/23  2346     History  Chief Complaint  Patient presents with   Weakness   Dizziness    Anita [name] is a 62 y.o. female.  Presents to the emergency department stating that she has not been feeling right for 2 or 3 days.  She has been feeling very weak and dizzy.  This worsens when she gets up and moves.  Husband reports that seems like she has been having trouble walking and at times her speech is slurred.       Home Medications Prior to Admission medications   Medication Sig Start Date End Date Taking? Authorizing Provider  acetaminophen (TYLENOL) 325 MG tablet Take 2 tablets (650 mg total) by mouth every 6 (six) hours as needed for mild pain, headache or fever (or Fever >/= 101). 11/13/21   Shon Hale, MD  ALPRAZolam Prudy Feeler) 1 MG tablet Take 0.5 mg by mouth in the morning, at noon, in the evening, and at bedtime.    [provider]  ascorbic acid (VITAMIN C) 500 MG tablet Take 1 tablet (500 mg total) by mouth daily. 11/14/21   Shon Hale, MD  atorvastatin (LIPITOR) 20 MG tablet Take 1 tablet (20 mg total) by mouth daily. 01/11/23   Jodelle Gross, NP  Cholecalciferol (VITAMIN D-3) 125 MCG (5000 UT) TABS Take 5,000 Units by mouth daily.    [provider]  cyanocobalamin (VITAMIN B12) 1000 MCG tablet Take 1 tablet (1,000 mcg total) by mouth daily. 04/20/22   Hongalgi, Maximino Greenland, MD  EPINEPHrine 0.3 mg/0.3 mL IJ SOAJ injection Inject 0.3 mg into the muscle as needed for anaphylaxis. Inject as needed for anaphlaxis Patient not taking: Reported on 01/11/2023    [provider]  gabapentin (NEURONTIN) 600 MG tablet Take 600 mg by mouth 3 (three) times daily. 10/27/21   [provider]  glipiZIDE (GLUCOTROL) 5 MG tablet Take 1 tablet (5 mg total) by mouth 2 (two) times daily before a meal. Patient taking differently: Take 5  mg by mouth 3 (three) times daily. 04/20/22   Hongalgi, Maximino Greenland, MD  isosorbide mononitrate (IMDUR) 30 MG 24 hr tablet Take 2 tablets (60 mg total) by mouth daily. 05/07/22 05/07/23  Albertine Grates, MD  levothyroxine (SYNTHROID) 25 MCG tablet Take 25 mcg by mouth daily. 10/05/21   [provider]  metFORMIN (GLUCOPHAGE) 500 MG tablet Take 500 mg by mouth 2 (two) times daily with a meal.    [provider]  metoprolol tartrate (LOPRESSOR) 100 MG tablet Take 1 tablet (100 mg total) by mouth 2 (two) times daily. 01/11/23   Jodelle Gross, NP  Multiple Vitamin (MULTIVITAMIN WITH MINERALS) TABS tablet Take 1 tablet by mouth daily. 04/21/22   Hongalgi, Maximino Greenland, MD  oxyCODONE (OXY IR/ROXICODONE) 5 MG immediate release tablet Take 10 mg by mouth every 6 (six) hours as needed for moderate pain (pain.). 08/29/19   [provider]  polyethylene glycol powder (GLYCOLAX/MIRALAX) 17 GM/SCOOP powder Take 17 g by mouth daily. Patient not taking: Reported on 01/11/2023 04/20/22   Elease Etienne, MD  thiamine (VITAMIN B1) 100 MG tablet Take 1 tablet (100 mg total) by mouth daily. 04/21/22   Hongalgi, Maximino Greenland, MD  tiZANidine (ZANAFLEX) 4 MG tablet Take 1 tablet (4 mg total) by mouth at bedtime. Patient taking differently: Take 8 mg by mouth every 6 (six) hours  as needed for muscle spasms. 11/13/21   Shon Hale, MD  zinc sulfate 220 (50 Zn) MG capsule Take 1 capsule (220 mg total) by mouth daily. 11/14/21   Shon Hale, MD      Allergies    Ramipril, Imitrex [sumatriptan], Lyrica [pregabalin], and Penicillins    Review of Systems   Review of Systems  Physical Exam Updated Vital Signs BP (!) 156/70   Pulse 66   Temp 97.7 F (36.5 C) (Oral)   Resp 14   Ht 5\' 5"  (1.651 m)   Wt 53.1 kg   SpO2 95%   BMI 19.47 kg/m  Physical Exam Vitals and nursing note reviewed.  Constitutional:      General: She is not in acute distress.    Appearance: She is well-developed.  HENT:      Head: Normocephalic and atraumatic.     Mouth/Throat:     Mouth: Mucous membranes are dry.  Eyes:     General: Vision grossly intact. Gaze aligned appropriately.     Extraocular Movements: Extraocular movements intact.     Conjunctiva/sclera: Conjunctivae normal.  Cardiovascular:     Rate and Rhythm: Normal rate and regular rhythm.     Pulses: Normal pulses.     Heart sounds: Normal heart sounds, S1 normal and S2 normal. No murmur heard.    No friction rub. No gallop.  Pulmonary:     Effort: Pulmonary effort is normal. No respiratory distress.     Breath sounds: Normal breath sounds.  Abdominal:     General: Bowel sounds are normal.     Palpations: Abdomen is soft.     Tenderness: There is no abdominal tenderness. There is no guarding or rebound.     Hernia: No hernia is present.  Musculoskeletal:        General: No swelling.     Cervical back: Full passive range of motion without pain, normal range of motion and neck supple. No spinous process tenderness or muscular tenderness. Normal range of motion.     Right lower leg: No edema.     Left lower leg: No edema.  Skin:    General: Skin is warm and dry.     Capillary Refill: Capillary refill takes less than 2 seconds.     Findings: No ecchymosis, erythema, rash or wound.  Neurological:     General: No focal deficit present.     Mental Status: She is alert and oriented to person, place, and time.     GCS: GCS eye subscore is 4. GCS verbal subscore is 5. GCS motor subscore is 6.     Cranial Nerves: Cranial nerves 2-12 are intact.     Sensory: Sensation is intact.     Motor: Motor function is intact.     Coordination: Coordination is intact.  Psychiatric:        Attention and Perception: Attention normal.        Mood and Affect: Mood normal.        Speech: Speech normal.        Behavior: Behavior normal.     ED Results / Procedures / Treatments   Labs (all labs ordered are listed, but only abnormal results are  displayed) Labs Reviewed  COMPREHENSIVE METABOLIC PANEL - Abnormal; Notable for the following components:      Result Value   Sodium 134 (*)    Chloride 97 (*)    Glucose, Bld 133 (*)    BUN 47 (*)  Creatinine, Ser 1.88 (*)    Calcium 8.8 (*)    Albumin 3.4 (*)    GFR, Estimated 30 (*)    All other components within normal limits  LACTIC ACID, PLASMA - Abnormal; Notable for the following components:   Lactic Acid, Venous 3.4 (*)    All other components within normal limits  LACTIC ACID, PLASMA - Abnormal; Notable for the following components:   Lactic Acid, Venous 3.5 (*)    All other components within normal limits  CBC WITH DIFFERENTIAL/PLATELET - Abnormal; Notable for the following components:   RBC 3.61 (*)    Hemoglobin 10.3 (*)    HCT 32.7 (*)    Neutro Abs 8.5 (*)    All other components within normal limits  URINALYSIS, W/ REFLEX TO CULTURE (INFECTION SUSPECTED) - Abnormal; Notable for the following components:   APPearance HAZY (*)    Protein, ur 100 (*)    Nitrite POSITIVE (*)    Leukocytes,Ua LARGE (*)    Bacteria, UA FEW (*)    Non Squamous Epithelial 11-20 (*)    All other components within normal limits  URINE CULTURE  PROTIME-INR    EKG None  Radiology DG Chest Portable 1 View  Result Date: 02/13/2023 CLINICAL DATA:  Sepsis. EXAM: PORTABLE CHEST 1 VIEW COMPARISON:  05/03/2022. FINDINGS: The heart size and mediastinal contours are within normal limits. Atherosclerotic calcification of the aorta is noted. Mild airspace disease is noted at the left lung base. No consolidation, effusion, or pneumothorax. No acute osseous abnormality. IMPRESSION: Mild residual airspace disease at the left lung base. Electronically Signed   By: Thornell Sartorius M.D.   On: 02/13/2023 01:15    Procedures Procedures    Medications Ordered in ED Medications  lactated ringers bolus 1,000 mL (0 mLs Intravenous Stopped 02/13/23 0403)  lactated ringers bolus 1,000 mL (1,000 mLs  Intravenous New Bag/Given 02/13/23 0403)  cefTRIAXone (ROCEPHIN) 1 g in sodium chloride 0.9 % 100 mL IVPB (0 g Intravenous Stopped 02/13/23 0341)    ED Course/ Medical Decision Making/ A&P                                 Medical Decision Making Amount and/or Complexity of Data Reviewed Labs: ordered. Radiology: ordered.   Differential diagnosis considered includes, but not limited to: TIA; Stroke; ICH; Seizure; electrolyte abnormality; hypoglycemia; toxic/pharmacologic causes; CNS infection; psychiatric disorder  Patient brought to the emergency department by husband for generalized weakness, dizziness, confusion.  Patient appears dry.  She was afebrile.  She did have hypotension initially, felt to be orthostasis, improved after she was in the room lying down.  Treated with IV fluids.  Initial BUN and creatinine are elevated above baseline.  Lactic acid elevated.  No signs of shock.  Given 2 L of lactated Ringer's.  Urinalysis finally obtained, does look infected.  Treated with Rocephin.  Will admit for further management.        Final Clinical Impression(s) / ED Diagnoses Final diagnoses:  Dehydration  Urinary tract infection without hematuria, site unspecified    Rx / DC Orders ED Discharge Orders     None         Ozzie Knobel, Canary Brim, MD 02/13/23 [phone]
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
## 2023-02-13 NOTE — Plan of Care (Signed)

## 2023-02-13 NOTE — ED Notes (Signed)
Date and time results received: 02/13/23 0120   Test: LACTIC Critical Value: 3.4  Name of Provider Notified: Blinda Leatherwood, MD

## 2023-02-13 NOTE — Progress Notes (Signed)
This nurse recheck her BP before giving the hydralazine at 1015 and it was 188/70. and rechecked her sugar after giving the D50 and it was 140. tech rechecked her sugar for lunch time and it was 108 ( This nurse was concerned with the big drop) also the tech said she didn't look the same when we first got her so she asked me to come look at her. When I first got her she was A x O x3 now she is A x O x 1 (to self). This nurse told the tech I would recheck her sugar in about 20 minutes because she is dropping. This nurse just checked her sugar again and its 86. Also her BP is going back up her BP now is 199/81. Notified Md.
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
## 2023-02-13 NOTE — Hospital Course (Signed)
62 year old female with stage IIIb CKD, hypertension, diabetes mellitus on insulin, coronary artery disease, hyperlipidemia, chronic pain, peripheral neuropathy, history of stroke, nonalcoholic fatty liver disease, migraine headaches, anxiety and anemia presented to the emergency department complaining of 2 to 3 days of increasing weakness and dizziness.  Also she has had some slurred speech as well according to husband.  He also reported she has been having difficulty walking.  At baseline patient is on high doses of opioid medications and sedating medications.  In the ED she was noted to be clinically dehydrated with an AKI and was treated with IV fluid hydration.  Also treated for urinary tract infection.  Admission requested for further management.

## 2023-02-14 DIAGNOSIS — Z720 Tobacco use: Secondary | ICD-10-CM

## 2023-02-14 DIAGNOSIS — N179 Acute kidney failure, unspecified: Secondary | ICD-10-CM | POA: Diagnosis not present

## 2023-02-14 DIAGNOSIS — I200 Unstable angina: Secondary | ICD-10-CM | POA: Diagnosis not present

## 2023-02-14 DIAGNOSIS — E1140 Type 2 diabetes mellitus with diabetic neuropathy, unspecified: Secondary | ICD-10-CM | POA: Diagnosis not present

## 2023-02-14 LAB — GLUCOSE, CAPILLARY
Glucose-Capillary: 190 mg/dL — ABNORMAL HIGH (ref 70–99)
Glucose-Capillary: 210 mg/dL — ABNORMAL HIGH (ref 70–99)
Glucose-Capillary: 253 mg/dL — ABNORMAL HIGH (ref 70–99)
Glucose-Capillary: 161 mg/dL — ABNORMAL HIGH (ref 70–99)
Glucose-Capillary: 201 mg/dL — ABNORMAL HIGH (ref 70–99)

## 2023-02-14 LAB — BASIC METABOLIC PANEL
Anion gap: 14 (ref 5–15)
BUN: 25 mg/dL — ABNORMAL HIGH (ref 8–23)
CO2: 25 mmol/L (ref 22–32)
Calcium: 9.4 mg/dL (ref 8.9–10.3)
Chloride: 97 mmol/L — ABNORMAL LOW (ref 98–111)
Creatinine, Ser: 1.04 mg/dL — ABNORMAL HIGH (ref 0.44–1.00)
GFR, Estimated: >60 mL/min (ref >60)
Glucose, Bld: 226 mg/dL — ABNORMAL HIGH (ref 70–99)
Potassium: 3.6 mmol/L (ref 3.5–5.1)
Sodium: 136 mmol/L (ref 135–145)

## 2023-02-14 LAB — CBC
HCT: 39.1 % (ref 36.0–46.0)
Hemoglobin: 12.7 g/dL (ref 12.0–15.0)
MCH: 27.9 pg (ref 26.0–34.0)
MCHC: 32.5 g/dL (ref 30.0–36.0)
MCV: 85.9 fL (ref 80.0–100.0)
Platelets: 286 10*3/uL (ref 150–400)
RBC: 4.55 MIL/uL (ref 3.87–5.11)
RDW: 13.2 % (ref 11.5–15.5)
WBC: 5.4 10*3/uL (ref 4.0–10.5)
nRBC: 0 % (ref 0.0–0.2)

## 2023-02-14 LAB — MAGNESIUM: Magnesium: 1.6 mg/dL — ABNORMAL LOW (ref 1.7–2.4)

## 2023-02-14 LAB — HEMOGLOBIN A1C
Hgb A1c MFr Bld: 5.4 % (ref 4.8–5.6)
Mean Plasma Glucose: 108 mg/dL

## 2023-02-14 MED ORDER — INSULIN GLARGINE-YFGN 100 UNIT/ML ~~LOC~~ SOLN
10.0000 [IU] | Freq: Every day | SUBCUTANEOUS | Status: DC
  Administered 2023-02-14 – 2023-02-15 (×2): 10 [IU] via SUBCUTANEOUS
  Filled 2023-02-14 (×3): qty 0.1

## 2023-02-14 MED ORDER — MAGNESIUM SULFATE 4 GM/100ML IV SOLN
4.0000 g | Freq: Once | INTRAVENOUS | Status: AC
  Administered 2023-02-14: 4 g via INTRAVENOUS
  Filled 2023-02-14: qty 100

## 2023-02-14 MED ORDER — AMLODIPINE BESYLATE 5 MG PO TABS
5.0000 mg | ORAL_TABLET | Freq: Every day | ORAL | Status: DC
  Administered 2023-02-14: 5 mg via ORAL
  Filled 2023-02-14: qty 1

## 2023-02-14 MED ORDER — LABETALOL HCL 5 MG/ML IV SOLN
10.0000 mg | INTRAVENOUS | Status: DC | PRN
  Administered 2023-02-14 – 2023-02-15 (×3): 10 mg via INTRAVENOUS
  Filled 2023-02-14 (×3): qty 4

## 2023-02-14 MED ORDER — INSULIN ASPART 100 UNIT/ML IJ SOLN: 5.0000 [IU] | Freq: Three times a day (TID) | INTRAMUSCULAR | Status: DC

## 2023-02-14 NOTE — Plan of Care (Signed)

## 2023-02-14 NOTE — Progress Notes (Addendum)
PROGRESS NOTE   Anita [name]  NWG:[id number] DOB: [birth year] DOA: 02/13/2023 PCP: Elfredia Nevins, MD   Chief Complaint  Patient presents with   Weakness   Dizziness   Level of care: Med-Surg  Brief Admission History:  62 year old female with stage IIIb CKD, hypertension, diabetes mellitus on insulin, coronary artery disease, hyperlipidemia, chronic pain, peripheral neuropathy, history of stroke, nonalcoholic fatty liver disease, migraine headaches, anxiety and anemia presented to the emergency department complaining of 2 to 3 days of increasing weakness and dizziness.  Also she has had some slurred speech as well according to husband.  He also reported she has been having difficulty walking.  At baseline patient is on high doses of opioid medications and sedating medications.  In the ED she was noted to be clinically dehydrated with an AKI and was treated with IV fluid hydration.  Also treated for urinary tract infection.  Admission requested for further management.   Assessment and Plan:  Generalized Weakness - suspect she was overmedicated with sedating meds in setting of dehydration and AKI on CKD stage 3b - reducing and / or holding all sedating medications for now - continue IV hydration - treating UTI   Essential Hypertension  - suboptimally controlled - resumed home BP lowering medications - hydralazine IV ordered as needed  - added amlodipine 5 mg daily on 9/16   Type 2 DM uncontrolled with hyperglycemia - BS remain suboptimally controlled - added glargine 10 units daily  - increased prandial novolog to 5 units TID  - continue to test BS 5 times per day    UTI - follow up urine culture and sensitivity  - continue IV ceftriaxone for full 3 doses to complete treatment   Hypomagnesemia - IV magnesium sulfate given 9/16 - recheck Mg in AM    CAD  - no current anginal symptoms - resumed home imdur, metoprolol, aspirin, atorvastatin   Lactic acidosis  - secondary to  dehydration  - resolved after IV fluid hydration    Chronic Pain and anxiety  - pt is on a lot of opioid, benzo, gabapentin - reduced opioid dose for now - reduced alprazolam dose and as needed only, Hold for sedation  - holding gabapentin at this time   Dyslipidemia - resumed home atorvastatin daily    DVT prophylaxis: sq heparin Code Status: full  Family Communication: husband updated 9/15  Disposition: anticipate home in 1-2 days   Consultants:   Procedures:   Antimicrobials:    Subjective: Pt more awake and alert today; no specific complaints. Pt seems able to make needs known.   Objective: Vitals:   02/14/23 0405 02/14/23 0700 02/14/23 0911 02/14/23 1002  BP: (!) 199/111 (!) 183/83 (!) 190/85 (!) 156/83  Pulse: 70  82 68  Resp: 20 20    Temp: 97.7 F (36.5 C)  98.9 F (37.2 C)   TempSrc: Oral  Oral   SpO2: 98%  97%   Weight:      Height:        Intake/Output Summary (Last 24 hours) at 02/14/2023 1350 Last data filed at 02/14/2023 1300 Gross per 24 hour  Intake 781.88 ml  Output 900 ml  Net -118.12 ml   Filed Weights   02/13/23 0024 02/13/23 0856  Weight: 53.1 kg 54.3 kg   Examination:  General exam: Appears calm and comfortable  Respiratory system: Clear to auscultation. Respiratory effort normal. Cardiovascular system: normal S1 & S2 heard. No JVD, murmurs, rubs, gallops or clicks. No pedal  edema. Gastrointestinal system: Abdomen is nondistended, soft and nontender. No organomegaly or masses felt. Normal bowel sounds heard. Central nervous system: Alert and oriented. No focal neurological deficits. Extremities: Symmetric 5 x 5 power. Skin: No rashes, lesions or ulcers. Psychiatry: Judgement and insight appear normal. Mood & affect appropriate.   Data Reviewed: I have personally reviewed following labs and imaging studies  CBC: Recent Labs  Lab 02/13/23 0049 02/14/23 0554  WBC 10.3 5.4  NEUTROABS 8.5*  --   HGB 10.3* 12.7  HCT 32.7* 39.1   MCV 90.6 85.9  PLT 251 286    Basic Metabolic Panel: Recent Labs  Lab 02/13/23 0049 02/14/23 0454  NA 134* 136  K 4.0 3.6  CL 97* 97*  CO2 24 25  GLUCOSE 133* 226*  BUN 47* 25*  CREATININE 1.88* 1.04*  CALCIUM 8.8* 9.4  MG  --  1.6*    CBG: Recent Labs  Lab 02/13/23 1938 02/13/23 2253 02/14/23 0352 02/14/23 0751 02/14/23 1106  GLUCAP 140* 214* 190* 210* 253*    Recent Results (from the past 240 hour(s))  Urine Culture     Status: Abnormal (Preliminary result)   Collection Time: 02/13/23  4:03 AM   Specimen: Urine, Random  Result Value Ref Range Status   Specimen Description   Final    URINE, RANDOM Performed at [facility], [address], [place], [place] [address]    Special Requests   Final    NONE Reflexed from [phone] Performed at [facility], [address], [place], [place] [address]    Culture (A)  Final    80,000 COLONIES/mL ESCHERICHIA COLI SUSCEPTIBILITIES TO FOLLOW Performed at [facility], [address], [place], [place] [address]    Report Status PENDING  Incomplete     Radiology Studies: DG Chest Portable 1 View  Result Date: 02/13/2023 CLINICAL DATA:  Sepsis. EXAM: PORTABLE CHEST 1 VIEW COMPARISON:  05/03/2022. FINDINGS: The heart size and mediastinal contours are within normal limits. Atherosclerotic calcification of the aorta is noted. Mild airspace disease is noted at the left lung base. No consolidation, effusion, or pneumothorax. No acute osseous abnormality. IMPRESSION: Mild residual airspace disease at the left lung base. Electronically Signed   By: Thornell Sartorius M.D.   On: 02/13/2023 01:15    Scheduled Meds:  aspirin EC  81 mg Oral Daily   atorvastatin  20 mg Oral Daily   busPIRone  10 mg Oral BID   heparin  5,000 Units Subcutaneous Q8H   influenza vac split trivalent PF  0.5 mL Intramuscular Tomorrow-1000   insulin aspart  0-9 Units Subcutaneous TID WC   insulin aspart  5 Units Subcutaneous TID WC   insulin  glargine-yfgn  10 Units Subcutaneous Daily   isosorbide mononitrate  30 mg Oral Daily   metoprolol tartrate  100 mg Oral BID   pantoprazole  40 mg Oral Q0600   pneumococcal 20-valent conjugate vaccine  0.5 mL Intramuscular Tomorrow-1000   Continuous Infusions:  sodium chloride 30 mL/hr at 02/14/23 0909   cefTRIAXone (ROCEPHIN)  IV 2 g (02/14/23 1121)     LOS: 1 day   Time spent: 38 mins  Scotty Weigelt Laural Benes, MD How to contact the [facility] Attending or Consulting provider 7A - 7P or covering provider during after hours 7P -7A, for this patient?  Check the care team in [facility] and look for a) attending/consulting TR[facility] provider listed and b) the [facility] team listed Log into [email or web address] and use [facility] universal password  to access. If you do not have the password, please contact the hospital operator. Locate the [facility] provider you are looking for under [facility] and page to a number that you can be directly reached. If you still have difficulty reaching the provider, please page the [facility] (Director on Call) for the Hospitalists listed on amion for assistance.  02/14/2023, 1:50 PM

## 2023-02-14 NOTE — Progress Notes (Signed)
Mobility Specialist Progress Note:    02/14/23 1200  Mobility  Activity Ambulated with assistance in hallway  Level of Assistance Contact guard assist, steadying assist  Assistive Device Cane  Distance Ambulated (ft) 150 ft  Range of Motion/Exercises Active;All extremities  Activity Response Tolerated well  Mobility Referral Yes  $Mobility charge 1 Mobility  Mobility Specialist Start Time (ACUTE ONLY) 1140  Mobility Specialist Stop Time (ACUTE ONLY) 1150  Mobility Specialist Time Calculation (min) (ACUTE ONLY) 10 min   Pt received in bathroom, agreeable to mobility. Required CGA to ambulate with cane. Tolerated well, asx throughout. Returned pt to EOB, family in room, all needs met.   Lawerance Bach Mobility Specialist Please contact via Special educational needs teacher or  Rehab office at [phone]

## 2023-02-15 DIAGNOSIS — G894 Chronic pain syndrome: Secondary | ICD-10-CM

## 2023-02-15 DIAGNOSIS — N3000 Acute cystitis without hematuria: Secondary | ICD-10-CM | POA: Diagnosis not present

## 2023-02-15 DIAGNOSIS — N179 Acute kidney failure, unspecified: Secondary | ICD-10-CM | POA: Diagnosis not present

## 2023-02-15 DIAGNOSIS — E872 Acidosis, unspecified: Secondary | ICD-10-CM | POA: Diagnosis not present

## 2023-02-15 LAB — URINE CULTURE: Culture: 80000 — AB

## 2023-02-15 LAB — BASIC METABOLIC PANEL
Anion gap: 12 (ref 5–15)
BUN: 21 mg/dL (ref 8–23)
CO2: 25 mmol/L (ref 22–32)
Calcium: 9.2 mg/dL (ref 8.9–10.3)
Chloride: 98 mmol/L (ref 98–111)
Creatinine, Ser: 0.98 mg/dL (ref 0.44–1.00)
GFR, Estimated: >60 mL/min (ref >60)
Glucose, Bld: 178 mg/dL — ABNORMAL HIGH (ref 70–99)
Potassium: 3.5 mmol/L (ref 3.5–5.1)
Sodium: 135 mmol/L (ref 135–145)

## 2023-02-15 LAB — MAGNESIUM: Magnesium: 2.2 mg/dL (ref 1.7–2.4)

## 2023-02-15 LAB — GLUCOSE, CAPILLARY
Glucose-Capillary: 182 mg/dL — ABNORMAL HIGH (ref 70–99)
Glucose-Capillary: 182 mg/dL — ABNORMAL HIGH (ref 70–99)
Glucose-Capillary: 175 mg/dL — ABNORMAL HIGH (ref 70–99)

## 2023-02-15 MED ORDER — ALPRAZOLAM 0.5 MG PO TABS: 0.2500 mg | ORAL_TABLET | Freq: Three times a day (TID) | ORAL | Status: DC

## 2023-02-15 MED ORDER — AMLODIPINE BESYLATE 5 MG PO TABS
10.0000 mg | ORAL_TABLET | Freq: Every day | ORAL | Status: DC
  Administered 2023-02-15: 10 mg via ORAL
  Filled 2023-02-15: qty 2

## 2023-02-15 MED ORDER — OXYCODONE HCL 15 MG PO TABS: 15.0000 mg | ORAL_TABLET | Freq: Three times a day (TID) | ORAL | Status: DC | PRN

## 2023-02-15 MED ORDER — AMLODIPINE BESYLATE 10 MG PO TABS: 10.0000 mg | ORAL_TABLET | Freq: Every day | ORAL | 1 refills | Status: DC

## 2023-02-15 MED ORDER — GLIPIZIDE 5 MG PO TABS: 5.0000 mg | ORAL_TABLET | Freq: Three times a day (TID) | ORAL | Status: DC

## 2023-02-15 MED ORDER — ISOSORBIDE MONONITRATE ER 60 MG PO TB24: 60.0000 mg | ORAL_TABLET | Freq: Every day | ORAL | 1 refills | Status: DC

## 2023-02-15 MED ORDER — GABAPENTIN 600 MG PO TABS: 600.0000 mg | ORAL_TABLET | Freq: Every day | ORAL | Status: DC

## 2023-02-15 MED ORDER — ISOSORBIDE MONONITRATE ER 60 MG PO TB24
60.0000 mg | ORAL_TABLET | Freq: Every day | ORAL | Status: DC
  Administered 2023-02-15: 60 mg via ORAL
  Filled 2023-02-15: qty 1

## 2023-02-15 NOTE — Care Management Important Message (Signed)
Important Message  Patient Details  Name: Anita [name] MRN: [id number] Date of Birth: 25-Jun-1960   Medicare Important Message Given:  N/A - LOS <3 / Initial given by admissions     Corey Harold 02/15/2023, 11:22 AM

## 2023-02-15 NOTE — Discharge Summary (Signed)
Physician Discharge Summary  Anita [name] VHQ:[id number] DOB: 03-23-1961 DOA: 02/13/2023  PCP: Elfredia Nevins, MD  Admit date: 02/13/2023 Discharge date: 02/15/2023  Admitted From:  Home  Disposition:  Hom  Recommendations for Outpatient Follow-up:  Follow up with PCP in 1 weeks for BP check and med check Please try to further reduce sedating medications as much as possible in the outpatient setting  Discharge Condition: STABLE   CODE STATUS: FULL DIET: Heart Healthy    Brief Hospitalization Summary: Please see all hospital notes, images, labs for full details of the hospitalization. Admission Provider HPI:  62 year old female with stage IIIb CKD, hypertension, diabetes mellitus on insulin, coronary artery disease, hyperlipidemia, chronic pain, peripheral neuropathy, history of stroke, nonalcoholic fatty liver disease, migraine headaches, anxiety and anemia presented to the emergency department complaining of 2 to 3 days of increasing weakness and dizziness.  Also she has had some slurred speech as well according to husband.  He also reported she has been having difficulty walking.  At baseline patient is on high doses of opioid medications and sedating medications.  In the ED she was noted to be clinically dehydrated with an AKI and was treated with IV fluid hydration.  Also treated for urinary tract infection.  Admission requested for further management.  Hospital Course by Problem List  Generalized Weakness - much improved  - suspect she was overmedicated with sedating meds in setting of dehydration and AKI on CKD stage 3b and UTI  - reduced and / or held all sedating medications and she responded very well and seems back to baseline per husband - she was treated with IV hydration - fully treated UTI    Essential Hypertension  - suboptimally controlled - resumed home BP lowering medications - hydralazine IV ordered as needed  - added amlodipine 10 mg daily on 9/16  - increased imdur  to 60 mg daily  - pt advised to follow up with PCP in 1 week for BP recheck and further med adjustments   Type 2 DM uncontrolled with hyperglycemia - BS suboptimally controlled - resume home oral treatments at DC and close follow up with PCP   E Coli UTI - Fully Treated in hospital  - pansensitive organism - treated IV ceftriaxone for full 3 doses to complete treatment    Hypomagnesemia - IV magnesium sulfate given 9/16 - repleted    CAD  - no current anginal symptoms - resumed imdur, metoprolol, aspirin, atorvastatin   Lactic acidosis  - secondary to dehydration  - resolved after IV fluid hydration    Chronic Pain and anxiety  - pt was on a lot of opioid, benzo, gabapentin and presented altered, obtunded - reduced opioid dose to Q8 hrs PRN - reduced alprazolam dose to 0.25 mg  - reduced gabapentin to once daily at HS   Dyslipidemia - resumed home atorvastatin daily    Discharge Diagnoses:  Principal Problem:   AKI (acute kidney injury) (HCC) Active Problems:   CAD (coronary artery disease), 30% LAD 9/12   HTN (hypertension)   Diabetes mellitus with neuropathy (HCC)   Dyslipidemia   Chronic pain due to trauma   Unstable angina, negative MI, negative to mildly positive Nuc study Known 30% LAD disease, medical therapy   Nonalcoholic fatty liver disease   Tobacco abuse   Chronic bilateral low back pain with left-sided sciatica   UTI (urinary tract infection)   Chronic kidney disease, stage 3b (HCC)   Anxiety   Chronic pain syndrome  Lactic acidosis   Discharge Instructions:  Allergies as of 02/15/2023       Reactions   Altace [ramipril] Swelling   Possible ACE-I induced angioedema    Imitrex [sumatriptan] Swelling   Swelling in neck   Lyrica [pregabalin] Other (See Comments)   Lower extremity swelling   Penicillins Rash        Medication List     STOP taking these medications    tiZANidine 4 MG tablet Commonly known as: ZANAFLEX       TAKE  these medications    ALPRAZolam 0.5 MG tablet Commonly known as: XANAX Take 0.5 tablets (0.25 mg total) by mouth 3 (three) times daily. What changed: how much to take   amLODipine 10 MG tablet Commonly known as: NORVASC Take 1 tablet (10 mg total) by mouth daily. Start taking on: February 16, 2023   aspirin EC 81 MG tablet Take 81 mg by mouth daily. Swallow whole.   atorvastatin 20 MG tablet Commonly known as: LIPITOR Take 1 tablet (20 mg total) by mouth daily.   busPIRone 10 MG tablet Commonly known as: BUSPAR Take 10 mg by mouth 2 (two) times daily.   gabapentin 600 MG tablet Commonly known as: NEURONTIN Take 1 tablet (600 mg total) by mouth at bedtime. What changed: when to take this   glipiZIDE 5 MG tablet Commonly known as: GLUCOTROL Take 1 tablet (5 mg total) by mouth with breakfast, with lunch, and with evening meal.   isosorbide mononitrate 60 MG 24 hr tablet Commonly known as: IMDUR Take 1 tablet (60 mg total) by mouth daily. What changed: medication strength   metFORMIN 500 MG tablet Commonly known as: GLUCOPHAGE Take 1,000 mg by mouth 2 (two) times daily with a meal.   metoprolol tartrate 100 MG tablet Commonly known as: LOPRESSOR Take 1 tablet (100 mg total) by mouth 2 (two) times daily.   oxyCODONE 15 MG immediate release tablet Commonly known as: ROXICODONE Take 1 tablet (15 mg total) by mouth every 8 (eight) hours as needed for pain. What changed: when to take this        Follow-up Information     Elfredia Nevins, MD. Schedule an appointment as soon as possible for a visit in 1 week(s).   Specialty: Internal Medicine Why: Hospital Follow Up Contact information: [address] [place] [address] [phone]                Allergies  Allergen Reactions   Altace [Ramipril] Swelling    Possible ACE-I induced angioedema    Imitrex [Sumatriptan] Swelling    Swelling in neck   Lyrica [Pregabalin] Other (See Comments)     Lower extremity swelling   Penicillins Rash   Allergies as of 02/15/2023       Reactions   Altace [ramipril] Swelling   Possible ACE-I induced angioedema    Imitrex [sumatriptan] Swelling   Swelling in neck   Lyrica [pregabalin] Other (See Comments)   Lower extremity swelling   Penicillins Rash        Medication List     STOP taking these medications    tiZANidine 4 MG tablet Commonly known as: ZANAFLEX       TAKE these medications    ALPRAZolam 0.5 MG tablet Commonly known as: XANAX Take 0.5 tablets (0.25 mg total) by mouth 3 (three) times daily. What changed: how much to take   amLODipine 10 MG tablet Commonly known as: NORVASC Take 1 tablet (10 mg total) by  mouth daily. Start taking on: February 16, 2023   aspirin EC 81 MG tablet Take 81 mg by mouth daily. Swallow whole.   atorvastatin 20 MG tablet Commonly known as: LIPITOR Take 1 tablet (20 mg total) by mouth daily.   busPIRone 10 MG tablet Commonly known as: BUSPAR Take 10 mg by mouth 2 (two) times daily.   gabapentin 600 MG tablet Commonly known as: NEURONTIN Take 1 tablet (600 mg total) by mouth at bedtime. What changed: when to take this   glipiZIDE 5 MG tablet Commonly known as: GLUCOTROL Take 1 tablet (5 mg total) by mouth with breakfast, with lunch, and with evening meal.   isosorbide mononitrate 60 MG 24 hr tablet Commonly known as: IMDUR Take 1 tablet (60 mg total) by mouth daily. What changed: medication strength   metFORMIN 500 MG tablet Commonly known as: GLUCOPHAGE Take 1,000 mg by mouth 2 (two) times daily with a meal.   metoprolol tartrate 100 MG tablet Commonly known as: LOPRESSOR Take 1 tablet (100 mg total) by mouth 2 (two) times daily.   oxyCODONE 15 MG immediate release tablet Commonly known as: ROXICODONE Take 1 tablet (15 mg total) by mouth every 8 (eight) hours as needed for pain. What changed: when to take this        Procedures/Studies: DG Chest Portable  1 View  Result Date: 02/13/2023 CLINICAL DATA:  Sepsis. EXAM: PORTABLE CHEST 1 VIEW COMPARISON:  05/03/2022. FINDINGS: The heart size and mediastinal contours are within normal limits. Atherosclerotic calcification of the aorta is noted. Mild airspace disease is noted at the left lung base. No consolidation, effusion, or pneumothorax. No acute osseous abnormality. IMPRESSION: Mild residual airspace disease at the left lung base. Electronically Signed   By: Thornell Sartorius M.D.   On: 02/13/2023 01:15     Subjective: Pt reports she is feeling much better and she is eager to get home today.   Discharge Exam: Vitals:   02/15/23 0630 02/15/23 0927  BP: (!) 164/78 (!) 186/94  Pulse: 78 79  Resp:  19  Temp:  98.2 F (36.8 C)  SpO2: 98% 99%   Vitals:   02/15/23 0332 02/15/23 0533 02/15/23 0630 02/15/23 0927  BP: (!) 184/81 (!) 193/90 (!) 164/78 (!) 186/94  Pulse: 72 73 78 79  Resp: 18   19  Temp: 98.5 F (36.9 C)   98.2 F (36.8 C)  TempSrc: Oral   Oral  SpO2: 98% 98% 98% 99%  Weight:      Height:        General: Pt is alert, awake, oriented x 3, not in acute distress Cardiovascular: RRR, S1/S2 +, no rubs, no gallops Respiratory: CTA bilaterally, no wheezing, no rhonchi Abdominal: Soft, NT, ND, bowel sounds + Extremities: no edema, no cyanosis   The results of significant diagnostics from this hospitalization (including imaging, microbiology, ancillary and laboratory) are listed below for reference.     Microbiology: Recent Results (from the past 240 hour(s))  Urine Culture     Status: Abnormal   Collection Time: 02/13/23  4:03 AM   Specimen: Urine, Random  Result Value Ref Range Status   Specimen Description   Final    URINE, RANDOM Performed at [facility], [address], [place], [place] [address]    Special Requests   Final    NONE Reflexed from [id number] Performed at [facility], [address], [place], [place] [address]    Culture 80,000 COLONIES/mL ESCHERICHIA COLI  (A)  Final  Report Status 02/15/2023 FINAL  Final   Organism ID, Bacteria ESCHERICHIA COLI (A)  Final      Susceptibility   Escherichia coli - MIC*    AMPICILLIN 4 SENSITIVE Sensitive     CEFAZOLIN <=4 SENSITIVE Sensitive     CEFEPIME <=0.12 SENSITIVE Sensitive     CEFTRIAXONE <=0.25 SENSITIVE Sensitive     CIPROFLOXACIN <=0.25 SENSITIVE Sensitive     GENTAMICIN <=1 SENSITIVE Sensitive     IMIPENEM <=0.25 SENSITIVE Sensitive     NITROFURANTOIN <=16 SENSITIVE Sensitive     TRIMETH/SULFA <=20 SENSITIVE Sensitive     AMPICILLIN/SULBACTAM <=2 SENSITIVE Sensitive     PIP/TAZO <=4 SENSITIVE Sensitive     * 80,000 COLONIES/mL ESCHERICHIA COLI     Labs: BNP (last 3 results) No results for input(s): "BNP" in the last 8760 hours. Basic Metabolic Panel: Recent Labs  Lab 02/13/23 0049 02/14/23 0454 02/15/23 0548  NA 134* 136 135  K 4.0 3.6 3.5  CL 97* 97* 98  CO2 24 25 25   GLUCOSE 133* 226* 178*  BUN 47* 25* 21  CREATININE 1.88* 1.04* 0.98  CALCIUM 8.8* 9.4 9.2  MG  --  1.6* 2.2   Liver Function Tests: Recent Labs  Lab 02/13/23 0049  AST 17  ALT 14  ALKPHOS 43  BILITOT 0.7  PROT 6.8  ALBUMIN 3.4*   No results for input(s): "LIPASE", "AMYLASE" in the last 168 hours. No results for input(s): "AMMONIA" in the last 168 hours. CBC: Recent Labs  Lab 02/13/23 0049 02/14/23 0554  WBC 10.3 5.4  NEUTROABS 8.5*  --   HGB 10.3* 12.7  HCT 32.7* 39.1  MCV 90.6 85.9  PLT 251 286   Cardiac Enzymes: No results for input(s): "CKTOTAL", "CKMB", "CKMBINDEX", "TROPONINI" in the last 168 hours. BNP: Invalid input(s): "POCBNP" CBG: Recent Labs  Lab 02/14/23 1106 02/14/23 1656 02/14/23 1958 02/15/23 0330 02/15/23 0708  GLUCAP 253* 161* 201* 182* 182*   D-Dimer No results for input(s): "DDIMER" in the last 72 hours. Hgb A1c Recent Labs    02/13/23 0808  HGBA1C 5.4   Lipid Profile No results for input(s): "CHOL", "HDL", "LDLCALC", "TRIG", "CHOLHDL", "LDLDIRECT" in the  last 72 hours. Thyroid function studies No results for input(s): "TSH", "T4TOTAL", "T3FREE", "THYROIDAB" in the last 72 hours.  Invalid input(s): "FREET3" Anemia work up No results for input(s): "VITAMINB12", "FOLATE", "FERRITIN", "TIBC", "IRON", "RETICCTPCT" in the last 72 hours. Urinalysis    Component Value Date/Time   COLORURINE YELLOW 02/13/2023 0403   APPEARANCEUR HAZY (A) 02/13/2023 0403   LABSPEC 1.018 02/13/2023 0403   PHURINE 5.0 02/13/2023 0403   GLUCOSEU NEGATIVE 02/13/2023 0403   HGBUR NEGATIVE 02/13/2023 0403   BILIRUBINUR NEGATIVE 02/13/2023 0403   KETONESUR NEGATIVE 02/13/2023 0403   PROTEINUR 100 (A) 02/13/2023 0403   UROBILINOGEN 0.2 02/04/2015 1915   NITRITE POSITIVE (A) 02/13/2023 0403   LEUKOCYTESUR LARGE (A) 02/13/2023 0403   Sepsis Labs Recent Labs  Lab 02/13/23 0049 02/14/23 0554  WBC 10.3 5.4   Microbiology Recent Results (from the past 240 hour(s))  Urine Culture     Status: Abnormal   Collection Time: 02/13/23  4:03 AM   Specimen: Urine, Random  Result Value Ref Range Status   Specimen Description   Final    URINE, RANDOM Performed at [facility], [address] [place], [place] [address]    Special Requests   Final    NONE Reflexed from [id number] Performed at [facility], [address], [place],  [place] [address]    Culture 80,000 COLONIES/mL ESCHERICHIA COLI (A)  Final   Report Status 02/15/2023 FINAL  Final   Organism ID, Bacteria ESCHERICHIA COLI (A)  Final      Susceptibility   Escherichia coli - MIC*    AMPICILLIN 4 SENSITIVE Sensitive     CEFAZOLIN <=4 SENSITIVE Sensitive     CEFEPIME <=0.12 SENSITIVE Sensitive     CEFTRIAXONE <=0.25 SENSITIVE Sensitive     CIPROFLOXACIN <=0.25 SENSITIVE Sensitive     GENTAMICIN <=1 SENSITIVE Sensitive     IMIPENEM <=0.25 SENSITIVE Sensitive     NITROFURANTOIN <=16 SENSITIVE Sensitive     TRIMETH/SULFA <=20 SENSITIVE Sensitive     AMPICILLIN/SULBACTAM <=2 SENSITIVE Sensitive     PIP/TAZO <=4  SENSITIVE Sensitive     * 80,000 COLONIES/mL ESCHERICHIA COLI   Time coordinating discharge: 39 min  SIGNED:  Standley Dakins, MD  [facility] 02/15/2023, 11:19 AM How to contact the [facility] Attending or Consulting provider 7A - 7P or covering provider during after hours 7P -7A, for this patient?  Check the care team in [facility] and look for a) attending/consulting [facility] provider listed and b) the [facility] team listed Log into [email or web address] and use [place]'s universal password to access. If you do not have the password, please contact the hospital operator. Locate the [facility] provider you are looking for under [facility] and page to a number that you can be directly reached. If you still have difficulty reaching the provider, please page the [facility] (Director on Call) for the Hospitalists listed on amion for assistance.

## 2023-02-15 NOTE — Progress Notes (Signed)
Patient discharged home with instructions given on medications and follow up visits, patient  and family verbalized  understanding. Prescriptions sent to Pharmacy of choice documented on AVS. IV discontinued,catheter intact. Accompanied by staff to an awaiting vehicle.

## 2023-02-15 NOTE — Discharge Instructions (Addendum)
PLEASE HAVE BLOOD PRESSURE RECHECKED WITH PRIMARY CARE OFFICE IN 1 WEEK.    IMPORTANT INFORMATION: PAY CLOSE ATTENTION   PHYSICIAN DISCHARGE INSTRUCTIONS  Follow with Primary care provider  Elfredia Nevins, MD  and other consultants as instructed by your Hospitalist Physician  SEEK MEDICAL CARE OR RETURN TO EMERGENCY ROOM IF SYMPTOMS COME BACK, WORSEN OR NEW PROBLEM DEVELOPS   Please note: You were cared for by a hospitalist during your hospital stay. Every effort will be made to forward records to your primary care provider.  You can request that your primary care provider send for your hospital records if they have not received them.  Once you are discharged, your primary care physician will handle any further medical issues. Please note that NO REFILLS for any discharge medications will be authorized once you are discharged, as it is imperative that you return to your primary care physician (or establish a relationship with a primary care physician if you do not have one) for your post hospital discharge needs so that they can reassess your need for medications and monitor your lab values.  Please get a complete blood count and chemistry panel checked by your Primary MD at your next visit, and again as instructed by your Primary MD.  Get Medicines reviewed and adjusted: Please take all your medications with you for your next visit with your Primary MD  Laboratory/radiological data: Please request your Primary MD to go over all hospital tests and procedure/radiological results at the follow up, please ask your primary care provider to get all Hospital records sent to his/her office.  In some cases, they will be blood work, cultures and biopsy results pending at the time of your discharge. Please request that your primary care provider follow up on these results.  If you are diabetic, please bring your blood sugar readings with you to your follow up appointment with primary care.    Please  call and make your follow up appointments as soon as possible.    Also Note the following: If you experience worsening of your admission symptoms, develop shortness of breath, life threatening emergency, suicidal or homicidal thoughts you must seek medical attention immediately by calling 911 or calling your MD immediately  if symptoms less severe.  You must read complete instructions/literature along with all the possible adverse reactions/side effects for all the Medicines you take and that have been prescribed to you. Take any new Medicines after you have completely understood and accpet all the possible adverse reactions/side effects.   Do not drive when taking Pain medications or sleeping medications (Benzodiazepines)  Do not take more than prescribed Pain, Sleep and Anxiety Medications. It is not advisable to combine anxiety,sleep and pain medications without talking with your primary care practitioner  Special Instructions: If you have smoked or chewed Tobacco  in the last 2 yrs please stop smoking, stop any regular Alcohol  and or any Recreational drug use.  Wear Seat belts while driving.  Do not drive if taking any narcotic, mind altering or controlled substances or recreational drugs or alcohol.

## 2023-02-15 NOTE — Progress Notes (Signed)
Mobility Specialist Progress Note:    02/15/23 1000  Mobility  Activity Ambulated with assistance in hallway  Level of Assistance Standby assist, set-up cues, supervision of patient - no hands on  Assistive Device Cane  Distance Ambulated (ft) 150 ft  Range of Motion/Exercises Active;All extremities  Activity Response Tolerated well  Mobility Referral Yes  $Mobility charge 1 Mobility  Mobility Specialist Start Time (ACUTE ONLY) 1000  Mobility Specialist Stop Time (ACUTE ONLY) 1010  Mobility Specialist Time Calculation (min) (ACUTE ONLY) 10 min   Pt received sitting EOB, family in room. Agreeable to mobility, required supervision to stand and ambulate with cane. Tolerated well, asx throughout. Returned pt to room, all needs met.   Lawerance Bach Mobility Specialist Please contact via Special educational needs teacher or  Rehab office at [phone]

## 2023-02-22 DIAGNOSIS — Z681 Body mass index (BMI) 19 or less, adult: Secondary | ICD-10-CM | POA: Diagnosis not present

## 2023-02-22 DIAGNOSIS — E1140 Type 2 diabetes mellitus with diabetic neuropathy, unspecified: Secondary | ICD-10-CM | POA: Diagnosis not present

## 2023-02-22 DIAGNOSIS — N390 Urinary tract infection, site not specified: Secondary | ICD-10-CM | POA: Diagnosis not present

## 2023-02-22 DIAGNOSIS — J209 Acute bronchitis, unspecified: Secondary | ICD-10-CM | POA: Diagnosis not present

## 2023-02-22 DIAGNOSIS — I129 Hypertensive chronic kidney disease with stage 1 through stage 4 chronic kidney disease, or unspecified chronic kidney disease: Secondary | ICD-10-CM | POA: Diagnosis not present

## 2023-02-22 DIAGNOSIS — E1165 Type 2 diabetes mellitus with hyperglycemia: Secondary | ICD-10-CM | POA: Diagnosis not present

## 2023-02-22 DIAGNOSIS — G894 Chronic pain syndrome: Secondary | ICD-10-CM | POA: Diagnosis not present

## 2023-03-17 ENCOUNTER — Inpatient Hospital Stay (HOSPITAL_COMMUNITY)
Admission: EM | Admit: 2023-03-17 | Discharge: 2023-03-20 | DRG: 689 | Disposition: A | Discharge status: Home / Self Care | Payer: Medicare Other | Attending: Family Medicine | Admitting: Family Medicine

## 2023-03-17 DIAGNOSIS — Z7984 Long term (current) use of oral hypoglycemic drugs: Secondary | ICD-10-CM | POA: Diagnosis not present

## 2023-03-17 DIAGNOSIS — N390 Urinary tract infection, site not specified: Principal | ICD-10-CM | POA: Diagnosis present

## 2023-03-17 DIAGNOSIS — N189 Chronic kidney disease, unspecified: Secondary | ICD-10-CM | POA: Diagnosis present

## 2023-03-17 DIAGNOSIS — E1142 Type 2 diabetes mellitus with diabetic polyneuropathy: Secondary | ICD-10-CM | POA: Diagnosis not present

## 2023-03-17 DIAGNOSIS — Z743 Need for continuous supervision: Secondary | ICD-10-CM | POA: Diagnosis not present

## 2023-03-17 DIAGNOSIS — Z720 Tobacco use: Secondary | ICD-10-CM | POA: Diagnosis present

## 2023-03-17 DIAGNOSIS — Z7982 Long term (current) use of aspirin: Secondary | ICD-10-CM | POA: Diagnosis not present

## 2023-03-17 DIAGNOSIS — E785 Hyperlipidemia, unspecified: Secondary | ICD-10-CM | POA: Diagnosis present

## 2023-03-17 DIAGNOSIS — Z8249 Family history of ischemic heart disease and other diseases of the circulatory system: Secondary | ICD-10-CM

## 2023-03-17 DIAGNOSIS — R410 Disorientation, unspecified: Secondary | ICD-10-CM

## 2023-03-17 DIAGNOSIS — R0689 Other abnormalities of breathing: Secondary | ICD-10-CM | POA: Diagnosis not present

## 2023-03-17 DIAGNOSIS — Z888 Allergy status to other drugs, medicaments and biological substances status: Secondary | ICD-10-CM

## 2023-03-17 DIAGNOSIS — Z823 Family history of stroke: Secondary | ICD-10-CM | POA: Diagnosis not present

## 2023-03-17 DIAGNOSIS — Z833 Family history of diabetes mellitus: Secondary | ICD-10-CM | POA: Diagnosis not present

## 2023-03-17 DIAGNOSIS — Z880 Allergy status to penicillin: Secondary | ICD-10-CM | POA: Diagnosis not present

## 2023-03-17 DIAGNOSIS — I129 Hypertensive chronic kidney disease with stage 1 through stage 4 chronic kidney disease, or unspecified chronic kidney disease: Secondary | ICD-10-CM | POA: Diagnosis present

## 2023-03-17 DIAGNOSIS — I10 Essential (primary) hypertension: Secondary | ICD-10-CM | POA: Diagnosis present

## 2023-03-17 DIAGNOSIS — Z79899 Other long term (current) drug therapy: Secondary | ICD-10-CM

## 2023-03-17 DIAGNOSIS — G629 Polyneuropathy, unspecified: Secondary | ICD-10-CM | POA: Diagnosis present

## 2023-03-17 DIAGNOSIS — G9341 Metabolic encephalopathy: Secondary | ICD-10-CM | POA: Diagnosis not present

## 2023-03-17 DIAGNOSIS — E1122 Type 2 diabetes mellitus with diabetic chronic kidney disease: Secondary | ICD-10-CM | POA: Diagnosis not present

## 2023-03-17 DIAGNOSIS — R531 Weakness: Secondary | ICD-10-CM | POA: Diagnosis not present

## 2023-03-17 DIAGNOSIS — I2510 Atherosclerotic heart disease of native coronary artery without angina pectoris: Secondary | ICD-10-CM | POA: Diagnosis not present

## 2023-03-17 DIAGNOSIS — G8929 Other chronic pain: Secondary | ICD-10-CM | POA: Diagnosis not present

## 2023-03-17 DIAGNOSIS — Z8673 Personal history of transient ischemic attack (TIA), and cerebral infarction without residual deficits: Secondary | ICD-10-CM

## 2023-03-17 DIAGNOSIS — F419 Anxiety disorder, unspecified: Secondary | ICD-10-CM | POA: Diagnosis present

## 2023-03-17 DIAGNOSIS — F32A Depression, unspecified: Secondary | ICD-10-CM | POA: Diagnosis present

## 2023-03-17 DIAGNOSIS — R739 Hyperglycemia, unspecified: Secondary | ICD-10-CM | POA: Diagnosis not present

## 2023-03-17 DIAGNOSIS — R6889 Other general symptoms and signs: Secondary | ICD-10-CM | POA: Diagnosis not present

## 2023-03-17 NOTE — ED Triage Notes (Signed)
Tx for UTI currently on antibiotics. Pt presents hyperglycemic and HTN. LKW 3xdays per what spouse stated to EMS. Pt has not been eating according to spouse and has AMS. Pt c/o abdominal pain.  CBG 334 BP 208/71

## 2023-03-18 ENCOUNTER — Observation Stay (HOSPITAL_COMMUNITY): Payer: Medicare Other

## 2023-03-18 ENCOUNTER — Encounter (HOSPITAL_COMMUNITY): Payer: Self-pay | Admitting: Emergency Medicine

## 2023-03-18 ENCOUNTER — Emergency Department (HOSPITAL_COMMUNITY): Payer: Medicare Other

## 2023-03-18 ENCOUNTER — Other Ambulatory Visit: Payer: Self-pay

## 2023-03-18 DIAGNOSIS — G9341 Metabolic encephalopathy: Secondary | ICD-10-CM | POA: Diagnosis not present

## 2023-03-18 LAB — COMPREHENSIVE METABOLIC PANEL
ALT: 20 U/L (ref 0–44)
AST: 21 U/L (ref 15–41)
Albumin: 4.4 g/dL (ref 3.5–5.0)
Alkaline Phosphatase: 51 U/L (ref 38–126)
Anion gap: 11 (ref 5–15)
BUN: 21 mg/dL (ref 8–23)
CO2: 27 mmol/L (ref 22–32)
Calcium: 9.6 mg/dL (ref 8.9–10.3)
Chloride: 101 mmol/L (ref 98–111)
Creatinine, Ser: 1.03 mg/dL — ABNORMAL HIGH (ref 0.44–1.00)
GFR, Estimated: >60 mL/min (ref >60)
Glucose, Bld: 215 mg/dL — ABNORMAL HIGH (ref 70–99)
Potassium: 3.8 mmol/L (ref 3.5–5.1)
Sodium: 139 mmol/L (ref 135–145)
Total Bilirubin: 0.7 mg/dL (ref 0.3–1.2)
Total Protein: 8.2 g/dL — ABNORMAL HIGH (ref 6.5–8.1)

## 2023-03-18 LAB — URINALYSIS, W/ REFLEX TO CULTURE (INFECTION SUSPECTED)
Bacteria, UA: NONE SEEN
Bilirubin Urine: NEGATIVE
Glucose, UA: 150 mg/dL — AB
Hgb urine dipstick: NEGATIVE
Ketones, ur: 20 mg/dL — AB
Nitrite: NEGATIVE
Protein, ur: >=300 mg/dL — AB
Specific Gravity, Urine: 1.018 (ref 1.005–1.030)
WBC, UA: >50 WBC/hpf (ref 0–5)
pH: 6 (ref 5.0–8.0)

## 2023-03-18 LAB — CBC WITH DIFFERENTIAL/PLATELET
Abs Immature Granulocytes: 0.03 10*3/uL (ref 0.00–0.07)
Basophils Absolute: 0 10*3/uL (ref 0.0–0.1)
Basophils Relative: 0 %
Eosinophils Absolute: 0 10*3/uL (ref 0.0–0.5)
Eosinophils Relative: 0 %
HCT: 41.5 % (ref 36.0–46.0)
Hemoglobin: 13.4 g/dL (ref 12.0–15.0)
Immature Granulocytes: 0 %
Lymphocytes Relative: 13 %
Lymphs Abs: 0.9 10*3/uL (ref 0.7–4.0)
MCH: 27.7 pg (ref 26.0–34.0)
MCHC: 32.3 g/dL (ref 30.0–36.0)
MCV: 85.7 fL (ref 80.0–100.0)
Monocytes Absolute: 0.7 10*3/uL (ref 0.1–1.0)
Monocytes Relative: 10 %
Neutro Abs: 5.3 10*3/uL (ref 1.7–7.7)
Neutrophils Relative %: 77 %
Platelets: 309 10*3/uL (ref 150–400)
RBC: 4.84 MIL/uL (ref 3.87–5.11)
RDW: 13.9 % (ref 11.5–15.5)
WBC: 6.9 10*3/uL (ref 4.0–10.5)
nRBC: 0 % (ref 0.0–0.2)

## 2023-03-18 LAB — GLUCOSE, CAPILLARY: Glucose-Capillary: 108 mg/dL — ABNORMAL HIGH (ref 70–99)

## 2023-03-18 LAB — LACTIC ACID, PLASMA: Lactic Acid, Venous: 1 mmol/L (ref 0.5–1.9)

## 2023-03-18 LAB — MAGNESIUM: Magnesium: 2.1 mg/dL (ref 1.7–2.4)

## 2023-03-18 LAB — CBG MONITORING, ED
Glucose-Capillary: 205 mg/dL — ABNORMAL HIGH (ref 70–99)
Glucose-Capillary: 275 mg/dL — ABNORMAL HIGH (ref 70–99)

## 2023-03-18 LAB — TSH: TSH: 0.878 u[IU]/mL (ref 0.350–4.500)

## 2023-03-18 MED ORDER — INSULIN ASPART 100 UNIT/ML IJ SOLN
0.0000 [IU] | Freq: Every day | INTRAMUSCULAR | Status: DC
  Administered 2023-03-19: 4 [IU] via SUBCUTANEOUS

## 2023-03-18 MED ORDER — FLUCONAZOLE 100 MG PO TABS
200.0000 mg | ORAL_TABLET | Freq: Every day | ORAL | Status: AC
  Administered 2023-03-18 – 2023-03-19 (×2): 200 mg via ORAL
  Filled 2023-03-18 (×2): qty 2

## 2023-03-18 MED ORDER — BISACODYL 10 MG RE SUPP: 10.0000 mg | Freq: Every day | RECTAL | Status: DC | PRN

## 2023-03-18 MED ORDER — HEPARIN SODIUM (PORCINE) 5000 UNIT/ML IJ SOLN
5000.0000 [IU] | Freq: Three times a day (TID) | INTRAMUSCULAR | Status: DC
  Administered 2023-03-18 – 2023-03-20 (×6): 5000 [IU] via SUBCUTANEOUS
  Filled 2023-03-18 (×6): qty 1

## 2023-03-18 MED ORDER — SODIUM CHLORIDE 0.9 % IV SOLN
2.0000 g | Freq: Once | INTRAVENOUS | Status: AC
  Administered 2023-03-18: 2 g via INTRAVENOUS
  Filled 2023-03-18: qty 20

## 2023-03-18 MED ORDER — ACETAMINOPHEN 650 MG RE SUPP: 650.0000 mg | Freq: Four times a day (QID) | RECTAL | Status: DC | PRN

## 2023-03-18 MED ORDER — SODIUM CHLORIDE 0.9% FLUSH
3.0000 mL | Freq: Two times a day (BID) | INTRAVENOUS | Status: DC
  Administered 2023-03-18 – 2023-03-20 (×3): 3 mL via INTRAVENOUS

## 2023-03-18 MED ORDER — ACETAMINOPHEN 325 MG PO TABS
650.0000 mg | ORAL_TABLET | Freq: Four times a day (QID) | ORAL | Status: DC | PRN
  Administered 2023-03-19 (×2): 650 mg via ORAL
  Filled 2023-03-18 (×2): qty 2

## 2023-03-18 MED ORDER — SODIUM CHLORIDE 0.9 % IV SOLN
1.0000 g | INTRAVENOUS | Status: DC
  Administered 2023-03-19 – 2023-03-20 (×2): 1 g via INTRAVENOUS
  Filled 2023-03-18 (×2): qty 10

## 2023-03-18 MED ORDER — AMLODIPINE BESYLATE 5 MG PO TABS
10.0000 mg | ORAL_TABLET | Freq: Every day | ORAL | Status: DC
  Administered 2023-03-19 – 2023-03-20 (×2): 10 mg via ORAL
  Filled 2023-03-18 (×2): qty 2

## 2023-03-18 MED ORDER — GLIPIZIDE 5 MG PO TABS
5.0000 mg | ORAL_TABLET | Freq: Two times a day (BID) | ORAL | Status: DC
  Administered 2023-03-18 – 2023-03-20 (×4): 5 mg via ORAL
  Filled 2023-03-18 (×4): qty 1

## 2023-03-18 MED ORDER — HYDRALAZINE HCL 20 MG/ML IJ SOLN
10.0000 mg | Freq: Once | INTRAMUSCULAR | Status: AC
  Administered 2023-03-18: 10 mg via INTRAVENOUS
  Filled 2023-03-18: qty 1

## 2023-03-18 MED ORDER — GABAPENTIN 300 MG PO CAPS
300.0000 mg | ORAL_CAPSULE | Freq: Every day | ORAL | Status: DC
  Administered 2023-03-18 – 2023-03-19 (×2): 300 mg via ORAL
  Filled 2023-03-18 (×2): qty 1

## 2023-03-18 MED ORDER — SODIUM CHLORIDE 0.9 % IV SOLN: INTRAVENOUS | Status: AC

## 2023-03-18 MED ORDER — ISOSORBIDE MONONITRATE ER 60 MG PO TB24
60.0000 mg | ORAL_TABLET | Freq: Every day | ORAL | Status: DC
  Administered 2023-03-19 – 2023-03-20 (×2): 60 mg via ORAL
  Filled 2023-03-18 (×2): qty 1

## 2023-03-18 MED ORDER — METOPROLOL TARTRATE 50 MG PO TABS
100.0000 mg | ORAL_TABLET | Freq: Two times a day (BID) | ORAL | Status: DC
  Administered 2023-03-18 – 2023-03-20 (×3): 100 mg via ORAL
  Filled 2023-03-18 (×4): qty 2

## 2023-03-18 MED ORDER — POLYETHYLENE GLYCOL 3350 17 G PO PACK: 17.0000 g | PACK | Freq: Every day | ORAL | Status: DC | PRN

## 2023-03-18 MED ORDER — ATORVASTATIN CALCIUM 20 MG PO TABS
20.0000 mg | ORAL_TABLET | Freq: Every day | ORAL | Status: DC
  Administered 2023-03-18 – 2023-03-19 (×2): 20 mg via ORAL
  Filled 2023-03-18 (×2): qty 1

## 2023-03-18 MED ORDER — ONDANSETRON HCL 4 MG PO TABS: 4.0000 mg | ORAL_TABLET | Freq: Four times a day (QID) | ORAL | Status: DC | PRN

## 2023-03-18 MED ORDER — TRAZODONE HCL 50 MG PO TABS
50.0000 mg | ORAL_TABLET | Freq: Every evening | ORAL | Status: DC | PRN
  Administered 2023-03-20: 50 mg via ORAL
  Filled 2023-03-18: qty 1

## 2023-03-18 MED ORDER — GLIPIZIDE 5 MG PO TABS: 5.0000 mg | ORAL_TABLET | Freq: Three times a day (TID) | ORAL | Status: DC

## 2023-03-18 MED ORDER — ASPIRIN 81 MG PO TBEC
81.0000 mg | DELAYED_RELEASE_TABLET | Freq: Every day | ORAL | Status: DC
  Administered 2023-03-19 – 2023-03-20 (×2): 81 mg via ORAL
  Filled 2023-03-18 (×2): qty 1

## 2023-03-18 MED ORDER — INSULIN ASPART 100 UNIT/ML IJ SOLN
0.0000 [IU] | Freq: Three times a day (TID) | INTRAMUSCULAR | Status: DC
  Administered 2023-03-18: 3 [IU] via SUBCUTANEOUS
  Administered 2023-03-19: 1 [IU] via SUBCUTANEOUS
  Filled 2023-03-18: qty 1

## 2023-03-18 MED ORDER — ALPRAZOLAM 0.25 MG PO TABS: 0.2500 mg | ORAL_TABLET | Freq: Three times a day (TID) | ORAL | Status: DC | PRN

## 2023-03-18 MED ORDER — ONDANSETRON HCL 4 MG/2ML IJ SOLN: 4.0000 mg | Freq: Four times a day (QID) | INTRAMUSCULAR | Status: DC | PRN

## 2023-03-18 MED ORDER — SODIUM CHLORIDE 0.9% FLUSH: 3.0000 mL | Freq: Two times a day (BID) | INTRAVENOUS | Status: DC

## 2023-03-18 MED ORDER — OXYCODONE HCL 5 MG PO TABS
15.0000 mg | ORAL_TABLET | Freq: Three times a day (TID) | ORAL | Status: DC | PRN
  Administered 2023-03-19: 15 mg via ORAL
  Filled 2023-03-18: qty 3

## 2023-03-18 MED ORDER — SODIUM CHLORIDE 0.9% FLUSH
10.0000 mL | Freq: Two times a day (BID) | INTRAVENOUS | Status: DC
  Administered 2023-03-18 – 2023-03-19 (×3): 10 mL via INTRAVENOUS

## 2023-03-18 MED ORDER — BUSPIRONE HCL 5 MG PO TABS
10.0000 mg | ORAL_TABLET | Freq: Two times a day (BID) | ORAL | Status: DC
  Administered 2023-03-18 – 2023-03-20 (×4): 10 mg via ORAL
  Filled 2023-03-18 (×4): qty 2

## 2023-03-18 MED ORDER — SODIUM CHLORIDE 0.9% FLUSH: 3.0000 mL | INTRAVENOUS | Status: DC | PRN

## 2023-03-18 MED ORDER — HYDRALAZINE HCL 20 MG/ML IJ SOLN
10.0000 mg | Freq: Four times a day (QID) | INTRAMUSCULAR | Status: DC | PRN
  Administered 2023-03-18 – 2023-03-19 (×2): 10 mg via INTRAVENOUS
  Filled 2023-03-18 (×2): qty 1

## 2023-03-18 NOTE — ED Notes (Signed)
Pt provided with water to drink to help with collection of urine sample.

## 2023-03-18 NOTE — ED Notes (Signed)
Pt attempting to provide urine sample
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
## 2023-03-18 NOTE — ED Provider Notes (Incomplete)
[place] EMERGENCY DEPARTMENT AT [facility] Provider Note   CSN: [id number] Arrival date & time: 03/17/23  2345     History {Add pertinent medical, surgical, social history, OB history to HPI:1} Chief Complaint  Patient presents with  . Altered Mental Status    Anita [name] is a 62 y.o. female.   Altered Mental Status      Home Medications Prior to Admission medications   Medication Sig Start Date End Date Taking? Authorizing Provider  ALPRAZolam Prudy Feeler) 0.5 MG tablet Take 0.5 tablets (0.25 mg total) by mouth 3 (three) times daily. 02/15/23   Johnson, Clanford L, MD  amLODipine (NORVASC) 10 MG tablet Take 1 tablet (10 mg total) by mouth daily. 02/16/23   Johnson, Clanford L, MD  aspirin EC 81 MG tablet Take 81 mg by mouth daily. Swallow whole.    [provider]  atorvastatin (LIPITOR) 20 MG tablet Take 1 tablet (20 mg total) by mouth daily. 01/11/23   Jodelle Gross, NP  busPIRone (BUSPAR) 10 MG tablet Take 10 mg by mouth 2 (two) times daily.    [provider]  gabapentin (NEURONTIN) 600 MG tablet Take 1 tablet (600 mg total) by mouth at bedtime. 02/15/23   Johnson, Clanford L, MD  glipiZIDE (GLUCOTROL) 5 MG tablet Take 1 tablet (5 mg total) by mouth with breakfast, with lunch, and with evening meal. 02/15/23   Johnson, Clanford L, MD  isosorbide mononitrate (IMDUR) 60 MG 24 hr tablet Take 1 tablet (60 mg total) by mouth daily. 02/15/23 02/15/24  Johnson, Clanford L, MD  metFORMIN (GLUCOPHAGE) 500 MG tablet Take 1,000 mg by mouth 2 (two) times daily with a meal.    [provider]  metoprolol tartrate (LOPRESSOR) 100 MG tablet Take 1 tablet (100 mg total) by mouth 2 (two) times daily. 01/11/23   Jodelle Gross, NP  oxyCODONE (ROXICODONE) 15 MG immediate release tablet Take 1 tablet (15 mg total) by mouth every 8 (eight) hours as needed for pain. 02/15/23   Johnson, Clanford L, MD      Allergies    Altace [ramipril], Imitrex  [sumatriptan], Lyrica [pregabalin], and Penicillins    Review of Systems   Review of Systems  Physical Exam Updated Vital Signs Temp 98.7 F (37.1 C) (Oral)   Ht 5\' 5"  (1.651 m)   Wt 54.4 kg   BMI 19.97 kg/m  Physical Exam  ED Results / Procedures / Treatments   Labs (all labs ordered are listed, but only abnormal results are displayed) Labs Reviewed  COMPREHENSIVE METABOLIC PANEL  CBC  URINALYSIS, ROUTINE W REFLEX MICROSCOPIC  CBG MONITORING, ED    EKG None  Radiology No results found.  Procedures Procedures  {Document cardiac monitor, telemetry assessment procedure when appropriate:1}  Medications Ordered in ED Medications - No data to display  ED Course/ Medical Decision Making/ A&P   {   Click here for ABCD2, HEART and other calculatorsREFRESH Note before signing :1}                              Medical Decision Making Amount and/or Complexity of Data Reviewed Labs: ordered.   ***  {Document critical care time when appropriate:1} {Document review of labs and clinical decision tools ie heart score, Chads2Vasc2 etc:1}  {Document your independent review of radiology images, and any outside records:1} {Document your discussion with family members, caretakers, and with consultants:1} {Document social determinants of health  affecting pt's care:1} {Document your decision making why or why not admission, treatments were needed:1} Final Clinical Impression(s) / ED Diagnoses Final diagnoses:  None    Rx / DC Orders ED Discharge Orders     None

## 2023-03-18 NOTE — ED Notes (Signed)
Pt sleeping, woke pt up to remind we still needed urine sample- pt spouse at bedside.

## 2023-03-18 NOTE — ED Provider Notes (Signed)
[place] EMERGENCY DEPARTMENT AT [facility] Provider Note   CSN: [id number] Arrival date & time: 03/17/23  2345     History  Chief Complaint  Patient presents with   Altered Mental Status    Anita [name] is a 62 y.o. female.  The history is provided by the patient and the EMS personnel. The history is limited by the condition of the patient (Altered mental status).  Altered Mental Status She has history of hypertension, diabetes, hyperlipidemia, chronic kidney disease, coronary artery disease, stroke and comes in by ambulance because of altered mental status.  She is currently on antibiotics for UTI, but she does not know how long she has been on the antibiotics.  She denies fever or chills and denies any abdominal pain or urinary symptoms.  She denies cough, nausea, vomiting.  EMS reported blood glucose 334 and elevated blood pressure 208/71.  Apparently, she was last at her normal mental status 3 days ago.   Home Medications Prior to Admission medications   Medication Sig Start Date End Date Taking? Authorizing Provider  ALPRAZolam Prudy Feeler) 0.5 MG tablet Take 0.5 tablets (0.25 mg total) by mouth 3 (three) times daily. 02/15/23   Johnson, Clanford L, MD  amLODipine (NORVASC) 10 MG tablet Take 1 tablet (10 mg total) by mouth daily. 02/16/23   Johnson, Clanford L, MD  aspirin EC 81 MG tablet Take 81 mg by mouth daily. Swallow whole.    [provider]  atorvastatin (LIPITOR) 20 MG tablet Take 1 tablet (20 mg total) by mouth daily. 01/11/23   Jodelle Gross, NP  busPIRone (BUSPAR) 10 MG tablet Take 10 mg by mouth 2 (two) times daily.    [provider]  gabapentin (NEURONTIN) 600 MG tablet Take 1 tablet (600 mg total) by mouth at bedtime. 02/15/23   Johnson, Clanford L, MD  glipiZIDE (GLUCOTROL) 5 MG tablet Take 1 tablet (5 mg total) by mouth with breakfast, with lunch, and with evening meal. 02/15/23   Johnson, Clanford L, MD  isosorbide mononitrate  (IMDUR) 60 MG 24 hr tablet Take 1 tablet (60 mg total) by mouth daily. 02/15/23 02/15/24  Johnson, Clanford L, MD  metFORMIN (GLUCOPHAGE) 500 MG tablet Take 1,000 mg by mouth 2 (two) times daily with a meal.    [provider]  metoprolol tartrate (LOPRESSOR) 100 MG tablet Take 1 tablet (100 mg total) by mouth 2 (two) times daily. 01/11/23   Jodelle Gross, NP  oxyCODONE (ROXICODONE) 15 MG immediate release tablet Take 1 tablet (15 mg total) by mouth every 8 (eight) hours as needed for pain. 02/15/23   Johnson, Clanford L, MD      Allergies    Altace [ramipril], Imitrex [sumatriptan], Lyrica [pregabalin], and Penicillins    Review of Systems   Review of Systems  Unable to perform ROS: Mental status change    Physical Exam Updated Vital Signs Temp 98.7 F (37.1 C) (Oral)   Ht 5\' 5"  (1.651 m)   Wt 54.4 kg   BMI 19.97 kg/m  Physical Exam Vitals and nursing note reviewed.   62 year old female, resting comfortably and in no acute distress. Vital signs are significant for elevated blood pressure. Oxygen saturation is 99%, which is normal. Head is normocephalic and atraumatic. PERRLA, EOMI. Oropharynx is clear. Neck is nontender and supple without adenopathy. Back is nontender and there is no CVA tenderness. Lungs are clear without rales, wheezes, or rhonchi. Chest is nontender. Heart has regular rate and  rhythm without murmur. Abdomen is soft, flat, nontender. Extremities have no cyanosis or edema. Skin is warm and dry without rash. Neurologic: Awake, alert, slightly slow to answer questions, oriented to person and place but not time, cranial nerves are intact, moves all extremities equally.  ED Results / Procedures / Treatments   Labs (all labs ordered are listed, but only abnormal results are displayed) Labs Reviewed  COMPREHENSIVE METABOLIC PANEL - Abnormal; Notable for the following components:      Result Value   Glucose, Bld 215 (*)    Creatinine, Ser 1.03 (*)     Total Protein 8.2 (*)    All other components within normal limits  URINALYSIS, W/ REFLEX TO CULTURE (INFECTION SUSPECTED) - Abnormal; Notable for the following components:   Glucose, UA 150 (*)    Ketones, ur 20 (*)    Protein, ur >=300 (*)    Leukocytes,Ua TRACE (*)    All other components within normal limits  CBG MONITORING, ED - Abnormal; Notable for the following components:   Glucose-Capillary 205 (*)    All other components within normal limits  URINE CULTURE  CBC WITH DIFFERENTIAL/PLATELET  LACTIC ACID, PLASMA  MAGNESIUM   Radiology DG Chest Port 1 View  Result Date: 03/18/2023 CLINICAL DATA:  Altered mental status EXAM: PORTABLE CHEST 1 VIEW COMPARISON:  02/13/2023 FINDINGS: Postoperative changes in the mediastinum. Heart size and pulmonary vascularity are normal. Lungs are clear and expanded. No pleural effusions. No pneumothorax. Mediastinal contours appear intact. IMPRESSION: No active disease. Electronically Signed   By: Burman Nieves M.D.   On: 03/18/2023 00:33    Procedures Procedures  Cardiac monitor shows sinus rhythm with occasional PAC, per my interpretation.  Medications Ordered in ED Medications - No data to display  ED Course/ Medical Decision Making/ A&P                                 Medical Decision Making Amount and/or Complexity of Data Reviewed Labs: ordered. Radiology: ordered.  Risk Decision regarding hospitalization.   Altered mental status in the setting of reported UTI.  I have reviewed her past records, and note hospital admission 02/13/2023-02/15/2023 for UTI.  I have ordered laboratory workup of CBC, comprehensive metabolic panel, magnesium level, lactic acid level, urinalysis, chest x-ray.  Chest x-ray shows no evidence of pneumonia.  I have independently viewed the image, and agree with the radiologist's interpretation.  I have reviewed her laboratory tests, my interpretation is normal CBC, normal lactic acid level, normal  magnesium level, mildly elevated glucose consistent with known history analysis consistent with UTI with greater than 50 WBCs per high-power field.  Urine has been sent for culture, I have ordered antibiotics of ceftriaxone.  Because of her altered mentation in the setting of UTI, she will need to be admitted.  I have paged the hospitalist, no response at this time.  I am signing the case out to Dr. Deretha Emory to talk with hospitalist.  Final Clinical Impression(s) / ED Diagnoses Final diagnoses:  Urinary tract infection without hematuria, site unspecified  Confusion    Rx / DC Orders ED Discharge Orders     None         Dione Booze, MD 03/18/23 [phone]

## 2023-03-18 NOTE — ED Notes (Signed)
Took pt to restroom to try and collect urine sample, this tech waited outside for her to finish.  After a few minutes the soap dispenser kept going off and checked up on pt and pt opened the door and handed the tech a urine cup filled with hand soap.  Told the pt that we needed a urine sample, so this tech went into the bathroom with pt and assisted with collection.  Pt continued to stand in bathroom and this tech told her we needed to sit on the toilet to use the restroom.  Urine sample collection was unsuccessful.

## 2023-03-18 NOTE — Progress Notes (Signed)
Patient admitted to floor, initial assessment complete. Patient is alert to self only, unable to answer admission screening questions. Bed alarm is on and call bell within reach.

## 2023-03-18 NOTE — ED Notes (Signed)
Gatorade given to pt to drink\

## 2023-03-18 NOTE — ED Notes (Signed)
Sandwich bag given at this time.

## 2023-03-18 NOTE — Plan of Care (Signed)
[facility] Tonsillectomy/Adenoidectomy, Postoperative PEDS care plan entered in error.

## 2023-03-18 NOTE — ED Notes (Signed)
Attending aware po intake is not successful.

## 2023-03-18 NOTE — ED Notes (Signed)
Nurse is encouraging pt to drink Gatorade. Pt is stating she can not drink anymore. Pt has not even drank the top rim of the bottle.

## 2023-03-18 NOTE — Plan of Care (Signed)
  Problem: Metabolic: Goal: Ability to maintain appropriate glucose levels will improve Outcome: Progressing   Problem: Skin Integrity: Goal: Risk for impaired skin integrity will decrease Outcome: Progressing   Problem: Tissue Perfusion: Goal: Adequacy of tissue perfusion will improve Outcome: Progressing   Problem: Activity: Goal: Risk for activity intolerance will decrease Outcome: Progressing   Problem: Nutrition: Goal: Adequate nutrition will be maintained Outcome: Progressing   Problem: Coping: Goal: Level of anxiety will decrease Outcome: Progressing   Problem: Pain Managment: Goal: General experience of comfort will improve Outcome: Progressing   Problem: Safety: Goal: Ability to remain free from injury will improve Outcome: Progressing

## 2023-03-19 DIAGNOSIS — G9341 Metabolic encephalopathy: Secondary | ICD-10-CM | POA: Diagnosis not present

## 2023-03-19 DIAGNOSIS — Z7984 Long term (current) use of oral hypoglycemic drugs: Secondary | ICD-10-CM | POA: Diagnosis not present

## 2023-03-19 DIAGNOSIS — G629 Polyneuropathy, unspecified: Secondary | ICD-10-CM | POA: Diagnosis present

## 2023-03-19 DIAGNOSIS — Z8249 Family history of ischemic heart disease and other diseases of the circulatory system: Secondary | ICD-10-CM | POA: Diagnosis not present

## 2023-03-19 DIAGNOSIS — F32A Depression, unspecified: Secondary | ICD-10-CM | POA: Diagnosis present

## 2023-03-19 DIAGNOSIS — G8929 Other chronic pain: Secondary | ICD-10-CM | POA: Diagnosis present

## 2023-03-19 DIAGNOSIS — E785 Hyperlipidemia, unspecified: Secondary | ICD-10-CM | POA: Diagnosis present

## 2023-03-19 DIAGNOSIS — R410 Disorientation, unspecified: Secondary | ICD-10-CM | POA: Diagnosis present

## 2023-03-19 DIAGNOSIS — I2510 Atherosclerotic heart disease of native coronary artery without angina pectoris: Secondary | ICD-10-CM | POA: Diagnosis present

## 2023-03-19 DIAGNOSIS — Z7982 Long term (current) use of aspirin: Secondary | ICD-10-CM | POA: Diagnosis not present

## 2023-03-19 DIAGNOSIS — Z8673 Personal history of transient ischemic attack (TIA), and cerebral infarction without residual deficits: Secondary | ICD-10-CM | POA: Diagnosis not present

## 2023-03-19 DIAGNOSIS — F419 Anxiety disorder, unspecified: Secondary | ICD-10-CM | POA: Diagnosis present

## 2023-03-19 DIAGNOSIS — E1142 Type 2 diabetes mellitus with diabetic polyneuropathy: Secondary | ICD-10-CM | POA: Diagnosis present

## 2023-03-19 DIAGNOSIS — E1122 Type 2 diabetes mellitus with diabetic chronic kidney disease: Secondary | ICD-10-CM | POA: Diagnosis present

## 2023-03-19 DIAGNOSIS — Z823 Family history of stroke: Secondary | ICD-10-CM | POA: Diagnosis not present

## 2023-03-19 DIAGNOSIS — Z833 Family history of diabetes mellitus: Secondary | ICD-10-CM | POA: Diagnosis not present

## 2023-03-19 DIAGNOSIS — Z720 Tobacco use: Secondary | ICD-10-CM | POA: Diagnosis not present

## 2023-03-19 DIAGNOSIS — N189 Chronic kidney disease, unspecified: Secondary | ICD-10-CM | POA: Diagnosis present

## 2023-03-19 DIAGNOSIS — Z888 Allergy status to other drugs, medicaments and biological substances status: Secondary | ICD-10-CM | POA: Diagnosis not present

## 2023-03-19 DIAGNOSIS — I129 Hypertensive chronic kidney disease with stage 1 through stage 4 chronic kidney disease, or unspecified chronic kidney disease: Secondary | ICD-10-CM | POA: Diagnosis present

## 2023-03-19 DIAGNOSIS — N390 Urinary tract infection, site not specified: Secondary | ICD-10-CM | POA: Diagnosis present

## 2023-03-19 DIAGNOSIS — Z79899 Other long term (current) drug therapy: Secondary | ICD-10-CM | POA: Diagnosis not present

## 2023-03-19 DIAGNOSIS — Z880 Allergy status to penicillin: Secondary | ICD-10-CM | POA: Diagnosis not present

## 2023-03-19 LAB — URINE CULTURE

## 2023-03-19 LAB — GLUCOSE, CAPILLARY
Glucose-Capillary: 140 mg/dL — ABNORMAL HIGH (ref 70–99)
Glucose-Capillary: 180 mg/dL — ABNORMAL HIGH (ref 70–99)
Glucose-Capillary: 100 mg/dL — ABNORMAL HIGH (ref 70–99)
Glucose-Capillary: 343 mg/dL — ABNORMAL HIGH (ref 70–99)

## 2023-03-19 NOTE — Progress Notes (Addendum)
PROGRESS NOTE   Anita [name], is a 62 y.o. female, DOB - 1960-07-01, ZOX:[id number]  Admit date - 03/17/2023   Admitting Physician Lucille Crichlow Mariea Clonts, MD  Outpatient Primary MD for the patient is Elfredia Nevins, MD  LOS - 0  Chief Complaint  Patient presents with   Altered Mental Status       Brief Narrative:  62 y.o. female  with  HTN, DM2, CAD, hyperlipidemia, chronic pain, peripheral neuropathy, history of stroke, nonalcoholic fatty liver disease, migraine headaches, anxiety and anemia presents to the ED by EMS admitted on 03/18/2023 with acute metabolic fatigue with concern for possible UTI    -Assessment and Plan: 1) Acute Metabolic Encephalopathy--suspect UTI related -Recently treated for UTI --UA suggestive of UTI, yeast in urine seen,  -CBC-wnl. , Lactic acid 1.0 -urine culture pending -Continue IV Rocephin and oral Diflucan pending further culture data -CT head without contrast without acute findings -Mentation improving, per husband at bedside patient is not quite back to baseline   2)DM2-recent A1c 5.4 reflecting excellent diabetic control PTA -Per EMS blood sugar was 334, Use Novolog/Humalog Sliding scale insulin with Accu-Cheks/Fingersticks as ordered    3)History of prior Stroke----presenting with altered mentation/disorientation -Please see #1 above -No obvious focal deficits at this time -CT head without contrast without acute findings -Continue aspirin and Lipitor for secondary prevention   4)HTN--stage II -- Per EMS blood pressure was 208/71 mmhg -- c/jn amlodipine, metoprolol and isosorbide -IV hydralazine as needed elevated BP   Dispo: The patient is from: Home              Anticipated d/c is to: Home              Anticipated d/c date is: 1 day              Patient currently is not medically stable to d/c. Barriers: Not Clinically Stable-   Status is: Inpatient   Code Status :  -  Code Status: Full Code   Family Communication:   Husband at  bedside  DVT Prophylaxis  :   - SCDs   heparin injection 5,000 Units Start: 03/18/23 1400 SCDs Start: 03/18/23 0815 Place TED hose Start: 03/18/23 0815   Lab Results  Component Value Date   PLT 309 03/18/2023   Inpatient Medications  Scheduled Meds:  amLODipine  10 mg Oral Daily   aspirin EC  81 mg Oral Daily   atorvastatin  20 mg Oral Daily   busPIRone  10 mg Oral BID   gabapentin  300 mg Oral QHS   glipiZIDE  5 mg Oral BID AC   heparin  5,000 Units Subcutaneous Q8H   insulin aspart  0-5 Units Subcutaneous QHS   insulin aspart  0-6 Units Subcutaneous TID WC   isosorbide mononitrate  60 mg Oral Daily   metoprolol tartrate  100 mg Oral BID   sodium chloride flush  10 mL Intravenous Q12H   sodium chloride flush  3 mL Intravenous Q12H   Continuous Infusions:  cefTRIAXone (ROCEPHIN)  IV 1 g (03/19/23 0522)   PRN Meds:.acetaminophen **OR** acetaminophen, ALPRAZolam, bisacodyl, hydrALAZINE, ondansetron **OR** ondansetron (ZOFRAN) IV, oxyCODONE, polyethylene glycol, sodium chloride flush, traZODone   Anti-infectives (From admission, onward)    Start     Dose/Rate Route Frequency Ordered Stop   03/19/23 0600  cefTRIAXone (ROCEPHIN) 1 g in sodium chloride 0.9 % 100 mL IVPB        1 g 200 mL/hr over 30 Minutes Intravenous Every 24  hours 03/18/23 0813 03/22/23 0559   03/18/23 2000  fluconazole (DIFLUCAN) tablet 200 mg        200 mg Oral Daily 03/18/23 1853 03/19/23 1034   03/18/23 0445  cefTRIAXone (ROCEPHIN) 2 g in sodium chloride 0.9 % 100 mL IVPB        2 g 200 mL/hr over 30 Minutes Intravenous  Once 03/18/23 0440 03/18/23 0536       Subjective: Windle Guard today has no fevers, no emesis,  No chest pain,    Mentation improving, per husband at bedside patient is not quite back to baseline -Oral intake is not great  Objective: Vitals:   03/19/23 0402 03/19/23 0451 03/19/23 0825 03/19/23 1452  BP: (!) 176/72 136/67 (!) 168/86 (!) 140/78  Pulse: (!) 59 82 78   Resp: 18   17   Temp: 98.5 F (36.9 C)  98.7 F (37.1 C) 98.3 F (36.8 C)  TempSrc: Oral  Oral Oral  SpO2: 97%  98% 99%  Weight:      Height:        Intake/Output Summary (Last 24 hours) at 03/19/2023 1628 Last data filed at 03/19/2023 1025 Gross per 24 hour  Intake 983.09 ml  Output --  Net 983.09 ml   Filed Weights   03/17/23 2357  Weight: 54.4 kg    Physical Exam  Gen:- Awake Alert, cooperative HEENT:- [place].AT, No sclera icterus Neck-Supple Neck,No JVD,.  Lungs-  CTAB , fair symmetrical air movement CV- S1, S2 normal, regular  Abd-  +ve B.Sounds, Abd Soft, No tenderness, no CVA area tenderness Extremity/Skin:- No  edema, pedal pulses present  Psych-affect is appropriate,  Mentation improving, per husband at bedside patient is not quite back to baseline Neuro-no new focal deficits, no tremors  Data Reviewed: I have personally reviewed following labs and imaging studies  CBC: Recent Labs  Lab 03/18/23 0026  WBC 6.9  NEUTROABS 5.3  HGB 13.4  HCT 41.5  MCV 85.7  PLT 309   Basic Metabolic Panel: Recent Labs  Lab 03/18/23 0026  NA 139  K 3.8  CL 101  CO2 27  GLUCOSE 215*  BUN 21  CREATININE 1.03*  CALCIUM 9.6  MG 2.1   GFR: Estimated Creatinine Clearance: 48.6 mL/min (A) (by C-G formula based on SCr of 1.03 mg/dL (H)). Liver Function Tests: Recent Labs  Lab 03/18/23 0026  AST 21  ALT 20  ALKPHOS 51  BILITOT 0.7  PROT 8.2*  ALBUMIN 4.4   Radiology Studies: CT HEAD WO CONTRAST ( )  Result Date: 03/18/2023 CLINICAL DATA:  Mental status change, unknown cause EXAM: CT HEAD WITHOUT CONTRAST TECHNIQUE: Contiguous axial images were obtained from the base of the skull through the vertex without intravenous contrast. RADIATION DOSE REDUCTION: This exam was performed according to the departmental dose-optimization program which includes automated exposure control, adjustment of the mA and/or kV according to patient size and/or use of iterative reconstruction  technique. COMPARISON:  CT head 05/03/2022. FINDINGS: Brain: No evidence of acute infarction, hemorrhage, hydrocephalus, extra-axial collection or mass lesion/mass effect. Patchy white matter hypodensities are nonspecific but compatible with chronic microvascular ischemic disease. Small remote lacunar infarct versus dilated perivascular space in the right basal ganglia appears similar. Vascular: Calcific atherosclerosis. No hyperdense vessel identified. Skull: No acute fracture. Sinuses/Orbits: Clear sinuses.  No acute orbital findings. Other: No mastoid effusions. IMPRESSION: Stable head CT.  No evidence of acute intracranial abnormality. Electronically Signed   By: Feliberto Harts M.D.   On: 03/18/2023 20:51  DG Chest Port 1 View  Result Date: 03/18/2023 CLINICAL DATA:  Altered mental status EXAM: PORTABLE CHEST 1 VIEW COMPARISON:  02/13/2023 FINDINGS: Postoperative changes in the mediastinum. Heart size and pulmonary vascularity are normal. Lungs are clear and expanded. No pleural effusions. No pneumothorax. Mediastinal contours appear intact. IMPRESSION: No active disease. Electronically Signed   By: Burman Nieves M.D.   On: 03/18/2023 00:33    Scheduled Meds:  amLODipine  10 mg Oral Daily   aspirin EC  81 mg Oral Daily   atorvastatin  20 mg Oral Daily   busPIRone  10 mg Oral BID   gabapentin  300 mg Oral QHS   glipiZIDE  5 mg Oral BID AC   heparin  5,000 Units Subcutaneous Q8H   insulin aspart  0-5 Units Subcutaneous QHS   insulin aspart  0-6 Units Subcutaneous TID WC   isosorbide mononitrate  60 mg Oral Daily   metoprolol tartrate  100 mg Oral BID   sodium chloride flush  10 mL Intravenous Q12H   sodium chloride flush  3 mL Intravenous Q12H   Continuous Infusions:  cefTRIAXone (ROCEPHIN)  IV 1 g (03/19/23 0522)    LOS: 0 days   Shon Hale M.D on 03/19/2023 at 4:28 PM  Go to [email or web address] - for contact info  [facility] - Office  [phone]  If 7PM-7AM,  please contact night-coverage [email or web address] 03/19/2023, 4:28 PM

## 2023-03-19 NOTE — Plan of Care (Signed)

## 2023-03-19 NOTE — Progress Notes (Signed)
03/19/23 1607  TOC Brief Assessment  Insurance and Status Reviewed  Patient has primary care physician Yes  Home environment has been reviewed From home  Prior level of function: Independent  Prior/Current Home Services No current home services  Social Determinants of Health Reivew SDOH reviewed no interventions necessary  Readmission risk has been reviewed Yes  Transition of care needs no transition of care needs at this time   Transition of Care [facility]) - Inpatient Brief Assessment   Patient Details  Name: Anita [name] MRN: [id number] Date of Birth: 1960/08/21  Transition of Care [facility] CM/SW Contact:    Jo Cerone A Lakela Kuba, RN Phone Number: 03/19/2023, 4:09 PM   Clinical Narrative: TOC needs have not been identified at this time. We will continue to monitor patient advancement through interdisciplinary progression rounds. If new patient transition needs arise, please place a TOC consult.

## 2023-03-19 NOTE — Care Management Obs Status (Signed)
MEDICARE OBSERVATION STATUS NOTIFICATION   Patient Details  Name: Anita [name] MRN: [id number] Date of Birth: April 15, 1961   Medicare Observation Status Notification Given:  Yes    Latrece Nitta A Carmichael Burdette, RN 03/19/2023, 3:16 PM

## 2023-03-20 DIAGNOSIS — G9341 Metabolic encephalopathy: Secondary | ICD-10-CM | POA: Diagnosis not present

## 2023-03-20 LAB — GLUCOSE, CAPILLARY: Glucose-Capillary: 147 mg/dL — ABNORMAL HIGH (ref 70–99)

## 2023-03-20 MED ORDER — TAMSULOSIN HCL 0.4 MG PO CAPS: 0.4000 mg | ORAL_CAPSULE | Freq: Every day | ORAL | 0 refills | Status: DC

## 2023-03-20 MED ORDER — BUTALBITAL-APAP-CAFFEINE 50-325-40 MG PO TABS
2.0000 | ORAL_TABLET | Freq: Once | ORAL | Status: AC
  Administered 2023-03-20: 2 via ORAL
  Filled 2023-03-20: qty 2

## 2023-03-20 MED ORDER — GABAPENTIN 600 MG PO TABS: 600.0000 mg | ORAL_TABLET | Freq: Every day | ORAL | Status: DC

## 2023-03-20 MED ORDER — ALPRAZOLAM 0.5 MG PO TABS: 0.2500 mg | ORAL_TABLET | Freq: Two times a day (BID) | ORAL | Status: DC | PRN

## 2023-03-20 MED ORDER — ACETAMINOPHEN 325 MG PO TABS: 650.0000 mg | ORAL_TABLET | Freq: Four times a day (QID) | ORAL | Status: AC | PRN

## 2023-03-20 MED ORDER — TAMSULOSIN HCL 0.4 MG PO CAPS
0.4000 mg | ORAL_CAPSULE | Freq: Once | ORAL | Status: AC
  Administered 2023-03-20: 0.4 mg via ORAL
  Filled 2023-03-20: qty 1

## 2023-03-20 NOTE — Evaluation (Signed)
Physical Therapy Evaluation Patient Details Name: Anita [name] MRN: [id number] DOB: September 27, 1960 Today's Date: 03/20/2023  History of Present Illness  Pt admitted due to change of mental status  Clinical Impression  Pt has no loss of balance with RW, has RW at home.  Pt is not in  Need of skilled PT         If plan is discharge home, recommend the following: Help with stairs or ramp for entrance;A little help with walking and/or transfers;Assistance with cooking/housework   Can travel by private vehicle    yes    Equipment Recommendations None recommended by PT  Recommendations for Other Services   no    Functional Status Assessment Patient has had a recent decline in their functional status and demonstrates the ability to make significant improvements in function in a reasonable and predictable amount of time.     Precautions / Restrictions Precautions Precautions: None Restrictions Weight Bearing Restrictions: No      Mobility  Bed Mobility Overal bed mobility: Independent                  Transfers Overall transfer level: Independent                      Ambulation/Gait Ambulation/Gait assistance: Modified independent (Device/Increase time) Gait Distance (Feet): 120 Feet Assistive device: Rolling walker (2 wheels) Gait Pattern/deviations: WFL(Within Functional Limits)              Pertinent Vitals/Pain Pain Assessment Pain Assessment: No/denies pain    Home Living Family/patient expects to be discharged to:: Private residence Living Arrangements: Spouse/significant other Available Help at Discharge: Family;Available PRN/intermittently Type of Home: Mobile home Home Access: Stairs to enter Entrance Stairs-Rails: Right;Left;Can reach both Entrance Stairs-Number of Steps: 3   Home Layout: One level Home Equipment: Agricultural consultant (2 wheels);Cane - single point;BSC/3in1;Shower seat Additional Comments: Patient now helping husband , pt  has daughter who can help both her and husband PRN (she works)    Prior Function Prior Level of Function : Independent/Modified Independent             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[employer], uses RW sometimes, does not drive ADLs Comments: Independent     Extremity/Trunk Assessment        Lower Extremity Assessment Lower Extremity Assessment: Overall WFL for tasks assessed       Communication   Communication Communication: No apparent difficulties Cueing Techniques: Verbal cues  Cognition Arousal: Alert Behavior During Therapy: WFL for tasks assessed/performed Overall Cognitive Status: Within Functional Limits for tasks assessed                                                 Assessment/Plan    PT Assessment Patient does not need any further PT services     PT Treatment Interventions      PT Goals (Current goals can be found in the Care Plan section)  Acute Rehab PT Goals Patient Stated Goal: to go home PT Goal Formulation: With patient Time For Goal Achievement: 03/20/23 Potential to Achieve Goals: Good        AM-PAC PT "6 Clicks" Mobility  Outcome Measure Help needed turning from your back to your side while in a flat bed without using bedrails?: None Help needed moving from lying on your back to sitting  on the side of a flat bed without using bedrails?: None Help needed moving to and from a bed to a chair (including a wheelchair)?: None Help needed standing up from a chair using your arms (e.g., wheelchair or bedside chair)?: None Help needed to walk in hospital room?: None Help needed climbing 3-5 steps with a railing? : A Little 6 Click Score: 23    End of Session Equipment Utilized During Treatment: Gait belt Activity Tolerance: Patient tolerated treatment well Patient left: in chair Nurse Communication: Mobility status PT Visit Diagnosis: Unsteadiness on feet (R26.81)    Time: 4098-1191 PT Time Calculation (min)  (ACUTE ONLY): 19 min   Charges:   PT Evaluation $PT Eval Low Complexity: 1 Low   PT General Charges $$ ACUTE PT VISIT: 1 Visit         Virgina Organ, PT CLT [phone]  03/20/2023, 10:06 AM

## 2023-03-20 NOTE — Plan of Care (Signed)
  Problem: Activity: Goal: Risk for activity intolerance will decrease Outcome: Progressing   Problem: Coping: Goal: Level of anxiety will decrease Outcome: Progressing   Problem: Pain Managment: Goal: General experience of comfort will improve Outcome: Progressing   
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
## 2023-03-20 NOTE — Discharge Instructions (Signed)
1)Decrease Alprazolam/Xanax to no more than twice daily 2)Decrease Oxycodone to no more than twice daily 3)Follow up with primary care physician Elfredia Nevins, MD in 1 week for recheck and reevaluation

## 2023-03-25 DIAGNOSIS — E1140 Type 2 diabetes mellitus with diabetic neuropathy, unspecified: Secondary | ICD-10-CM | POA: Diagnosis not present

## 2023-03-25 DIAGNOSIS — I129 Hypertensive chronic kidney disease with stage 1 through stage 4 chronic kidney disease, or unspecified chronic kidney disease: Secondary | ICD-10-CM | POA: Diagnosis not present

## 2023-05-16 DIAGNOSIS — G894 Chronic pain syndrome: Secondary | ICD-10-CM | POA: Diagnosis not present

## 2023-05-16 DIAGNOSIS — I129 Hypertensive chronic kidney disease with stage 1 through stage 4 chronic kidney disease, or unspecified chronic kidney disease: Secondary | ICD-10-CM | POA: Diagnosis not present

## 2023-05-16 DIAGNOSIS — E1140 Type 2 diabetes mellitus with diabetic neuropathy, unspecified: Secondary | ICD-10-CM | POA: Diagnosis not present

## 2023-06-08 IMAGING — CT CT ABD-PELV W/ CM
2 of 5 series · 16 of 46 positions shown, 18 images · IV contrast (omnipaque)
Comparison: None.

CLINICAL DATA: Abdominal distension.

EXAM:
CT ABDOMEN AND PELVIS WITH CONTRAST
TECHNIQUE: Multidetector CT imaging of the abdomen and pelvis was performed
using the standard protocol following bolus administration of
intravenous contrast.
CONTRAST:  75mL OMNIPAQUE IOHEXOL 350 MG/ML SOLN

[Series 2: axial st · axial · 0.74mm/px · z∈[-243,+157]mm · 13 of 94 slices shown, 15 images]
[im 7/94  soft-tissue]
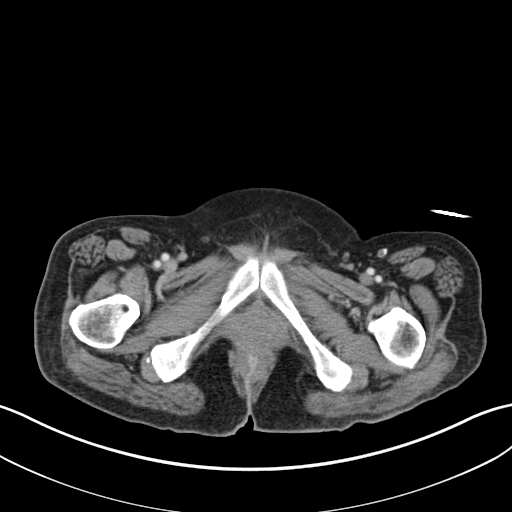
[im 7/94  bone]
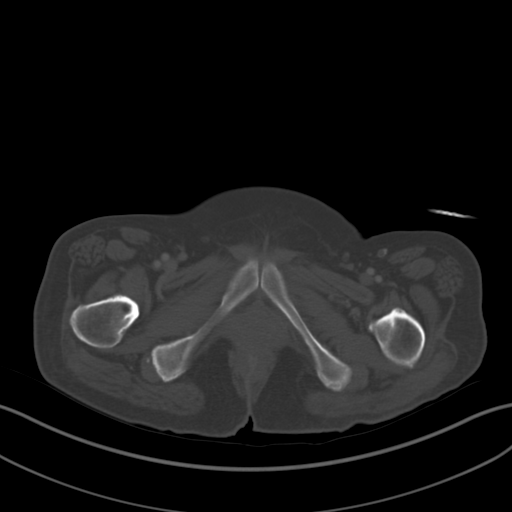
[im 13/94  soft-tissue]
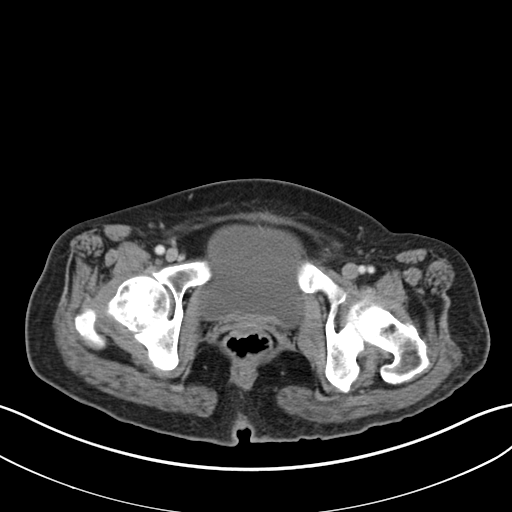
[im 19/94  soft-tissue]
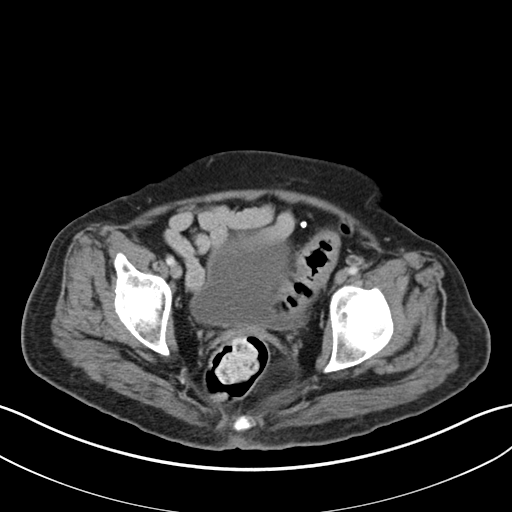
[im 25/94  soft-tissue]
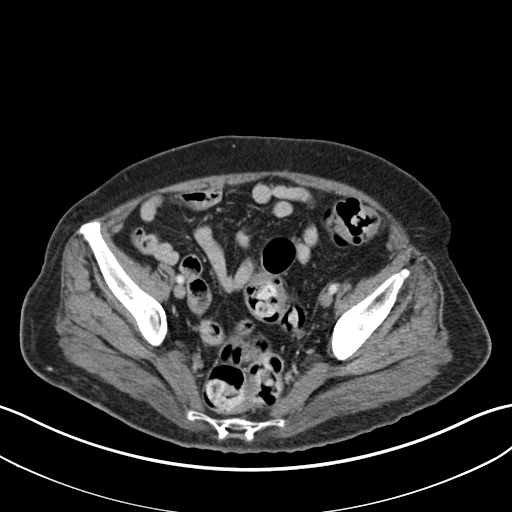
[im 32/94  soft-tissue]
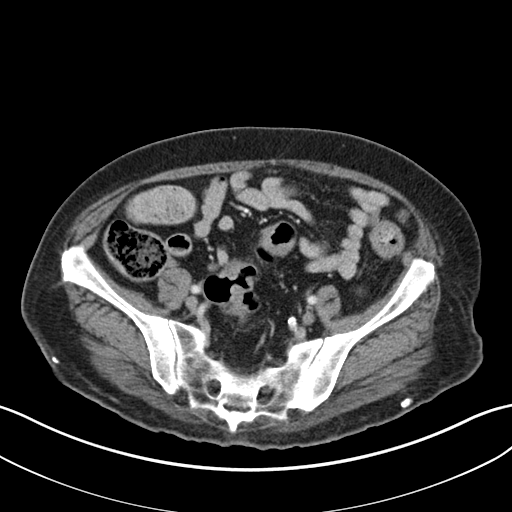
[im 38/94  soft-tissue]
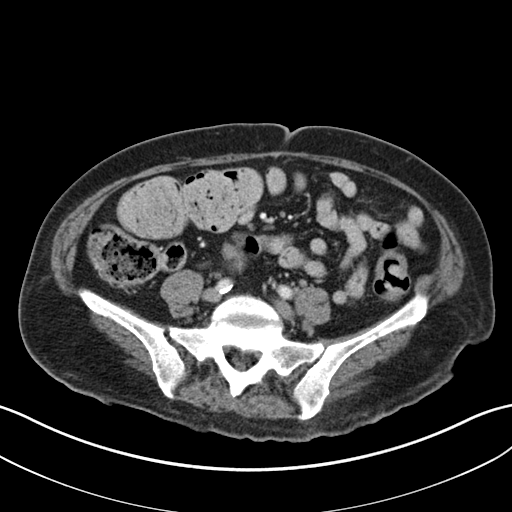
[im 50/94  soft-tissue]
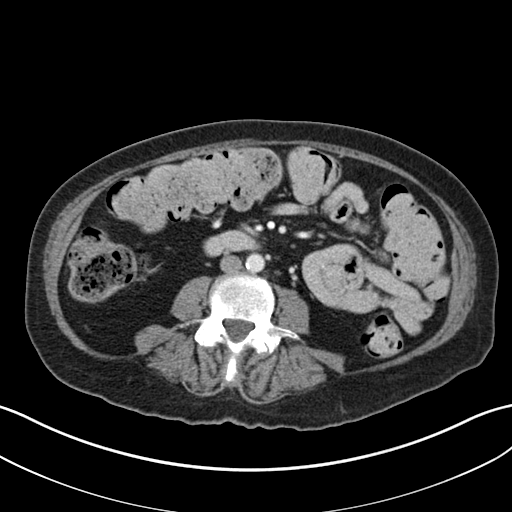
[im 56/94  soft-tissue]
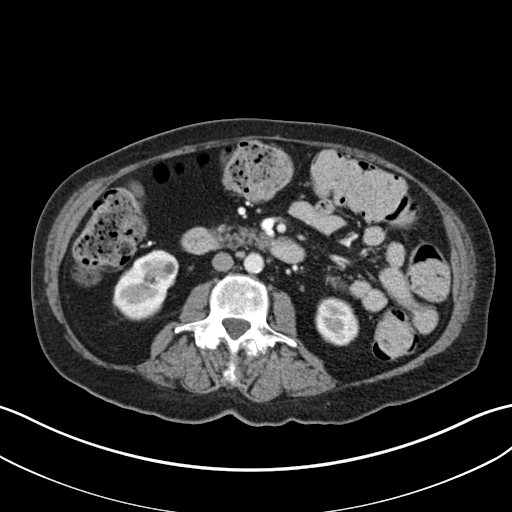
[im 63/94  soft-tissue]
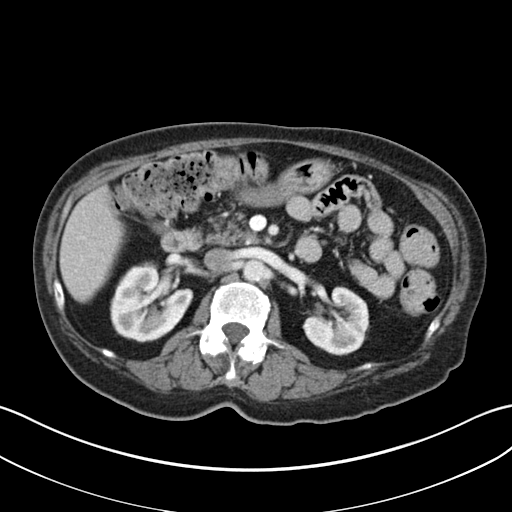
[im 63/94  bone]
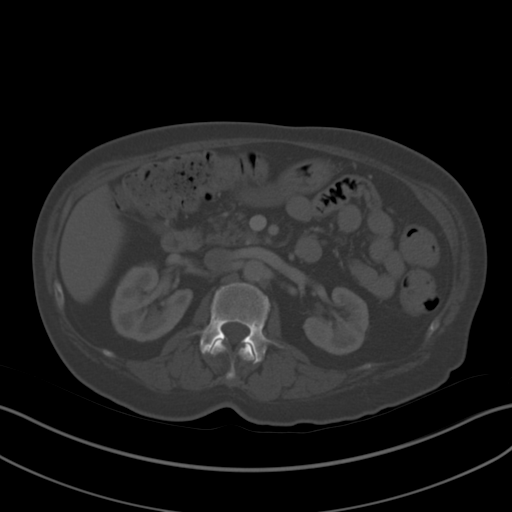
[im 69/94  soft-tissue]
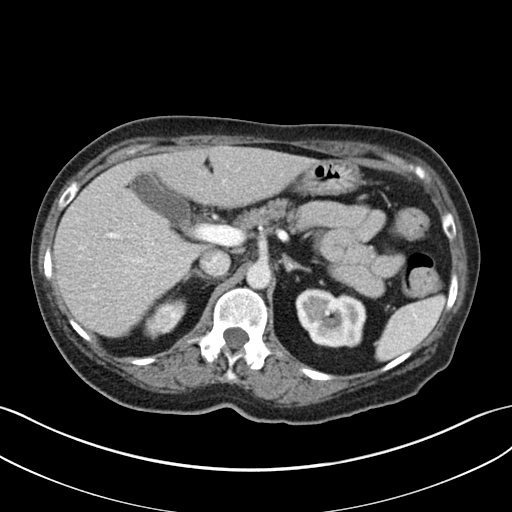
[im 75/94  soft-tissue]
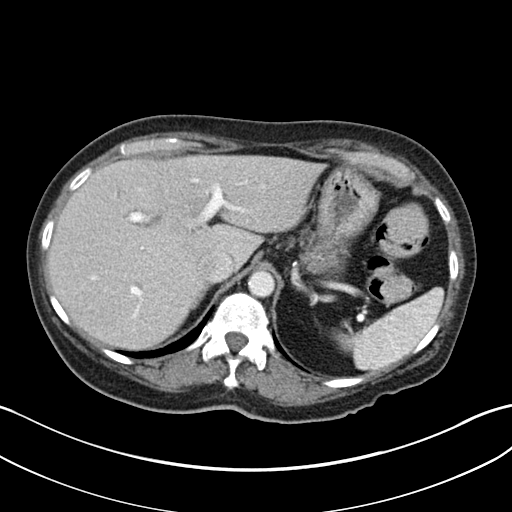
[im 81/94  soft-tissue]
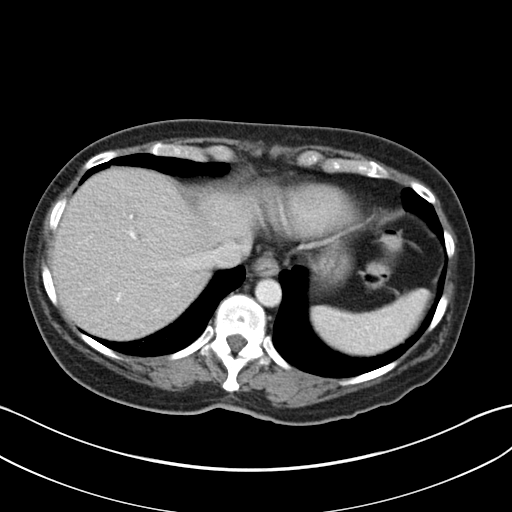
[im 87/94  soft-tissue]
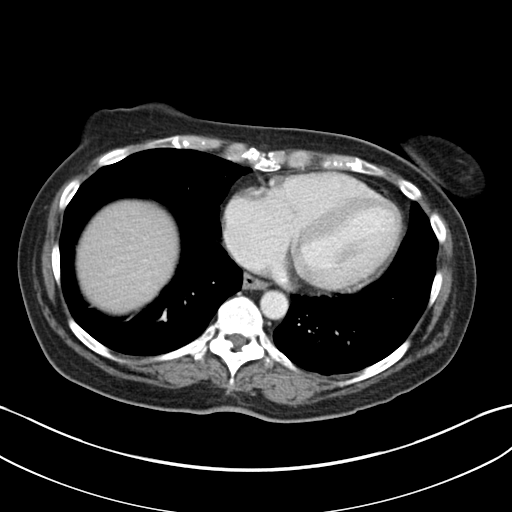

[Series 5: coronal st · coronal · 0.74mm/px · 3 of 123 slices shown]
[im 41/123  soft-tissue]
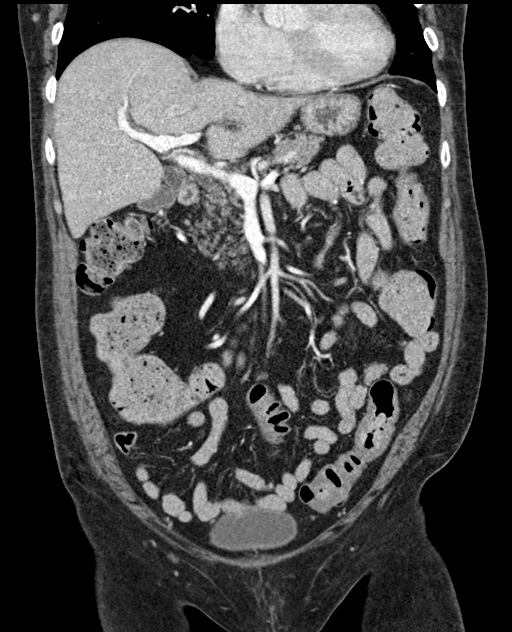
[im 55/123  soft-tissue]
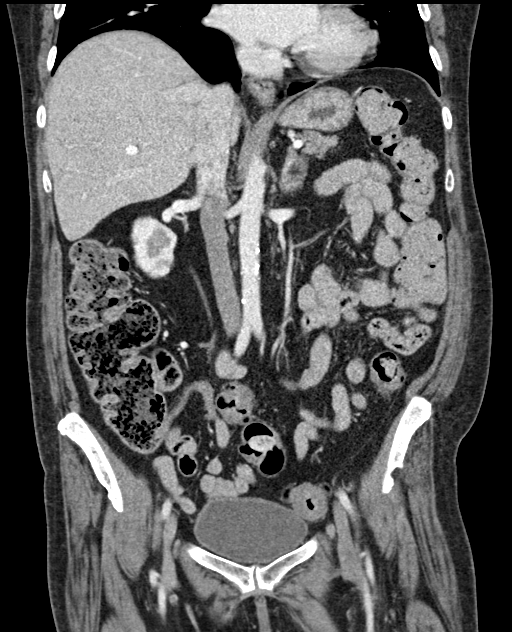
[im 68/123  soft-tissue]
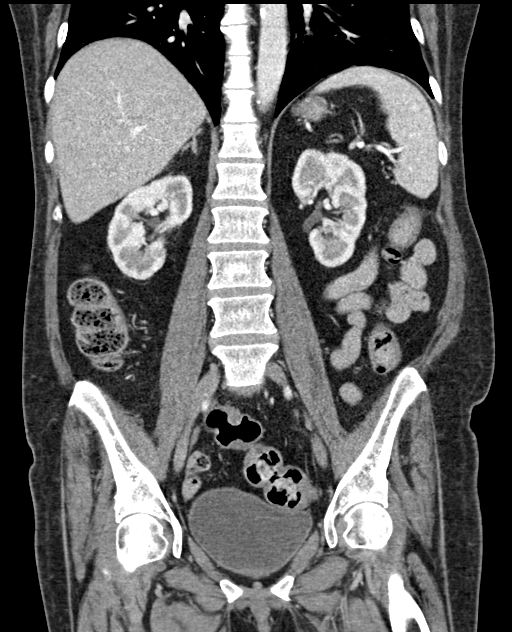

[16 of 46 positions shown; findings below may reference images not displayed]

FINDINGS: Lower chest: Partially visualized nodular-like density in the left
lower lobe measuring 1.4 cm.

Hepatobiliary: No focal liver abnormality. No gallstones,
gallbladder wall thickening, or pericholecystic fluid. No biliary
dilatation.

Pancreas: No focal lesion. Normal pancreatic contour. No surrounding
inflammatory changes. No main pancreatic ductal dilatation.

Spleen: Normal in size without focal abnormality.

Adrenals/Urinary Tract:

No adrenal nodule bilaterally.

Bilateral kidneys enhance symmetrically. Pericentimeter fluid
density lesion within the right kidney likely represents a simple
renal cyst. There is an 8 mm calcified stone within the left kidney.
No right nephrolithiasis.

No hydronephrosis. No hydroureter.

The urinary bladder is unremarkable.

Stomach/Bowel: Stomach is within normal limits. No evidence of bowel
wall thickening or dilatation. Stool throughout the colon. Likely
peristalsis along the proximal transverse colon ([DATE]). The appendix
caliber measures at the upper limits of normal with no associated
peri inflammatory changes or appendicolith identified.

Vascular/Lymphatic: No abdominal aorta or iliac aneurysm. Mild
atherosclerotic plaque of the aorta and its branches. No abdominal,
pelvic, or inguinal lymphadenopathy.

Reproductive: Status post hysterectomy. No adnexal masses.

Other: No intraperitoneal free fluid. No intraperitoneal free gas.
No organized fluid collection.

Musculoskeletal:

No abdominal wall hernia or abnormality.

No suspicious lytic or blastic osseous lesions. No acute displaced
fracture. Multilevel degenerative changes of the spine. Sacroiliac
joint degenerative changes.
IMPRESSION: 1. Constipation.
2. Nonobstructive 8 mm left nephrolithiasis.
3. Partially visualized nodular-like density in the left lower lobe
measuring 1.4 cm. Recommend PA and lateral chest x-ray for
evaluation. Consider follow-up with CT in 3 months.

## 2023-06-28 ENCOUNTER — Inpatient Hospital Stay (HOSPITAL_COMMUNITY): Payer: Medicare Other

## 2023-06-28 ENCOUNTER — Other Ambulatory Visit: Payer: Self-pay

## 2023-06-28 ENCOUNTER — Emergency Department (HOSPITAL_COMMUNITY): Payer: Medicare Other

## 2023-06-28 ENCOUNTER — Observation Stay (HOSPITAL_COMMUNITY)
Admission: EM | Admit: 2023-06-28 | Discharge: 2023-06-29 | Disposition: A | Discharge status: Home / Self Care | Payer: Medicare Other | Attending: Internal Medicine | Admitting: Internal Medicine

## 2023-06-28 ENCOUNTER — Encounter (HOSPITAL_COMMUNITY): Payer: Self-pay | Admitting: *Deleted

## 2023-06-28 DIAGNOSIS — Z8673 Personal history of transient ischemic attack (TIA), and cerebral infarction without residual deficits: Secondary | ICD-10-CM | POA: Insufficient documentation

## 2023-06-28 DIAGNOSIS — Z79899 Other long term (current) drug therapy: Secondary | ICD-10-CM | POA: Insufficient documentation

## 2023-06-28 DIAGNOSIS — E1142 Type 2 diabetes mellitus with diabetic polyneuropathy: Secondary | ICD-10-CM | POA: Diagnosis not present

## 2023-06-28 DIAGNOSIS — R6889 Other general symptoms and signs: Secondary | ICD-10-CM | POA: Diagnosis not present

## 2023-06-28 DIAGNOSIS — G8929 Other chronic pain: Secondary | ICD-10-CM | POA: Diagnosis not present

## 2023-06-28 DIAGNOSIS — G9341 Metabolic encephalopathy: Principal | ICD-10-CM | POA: Diagnosis present

## 2023-06-28 DIAGNOSIS — R4182 Altered mental status, unspecified: Principal | ICD-10-CM

## 2023-06-28 DIAGNOSIS — Z7984 Long term (current) use of oral hypoglycemic drugs: Secondary | ICD-10-CM | POA: Diagnosis not present

## 2023-06-28 DIAGNOSIS — Z7982 Long term (current) use of aspirin: Secondary | ICD-10-CM | POA: Diagnosis not present

## 2023-06-28 DIAGNOSIS — I6782 Cerebral ischemia: Secondary | ICD-10-CM | POA: Diagnosis not present

## 2023-06-28 DIAGNOSIS — G894 Chronic pain syndrome: Secondary | ICD-10-CM | POA: Diagnosis present

## 2023-06-28 DIAGNOSIS — R030 Elevated blood-pressure reading, without diagnosis of hypertension: Secondary | ICD-10-CM

## 2023-06-28 DIAGNOSIS — F419 Anxiety disorder, unspecified: Secondary | ICD-10-CM | POA: Diagnosis not present

## 2023-06-28 DIAGNOSIS — E1140 Type 2 diabetes mellitus with diabetic neuropathy, unspecified: Secondary | ICD-10-CM | POA: Diagnosis present

## 2023-06-28 DIAGNOSIS — R531 Weakness: Secondary | ICD-10-CM | POA: Diagnosis not present

## 2023-06-28 DIAGNOSIS — E785 Hyperlipidemia, unspecified: Secondary | ICD-10-CM | POA: Diagnosis present

## 2023-06-28 DIAGNOSIS — Z87891 Personal history of nicotine dependence: Secondary | ICD-10-CM | POA: Diagnosis not present

## 2023-06-28 DIAGNOSIS — N179 Acute kidney failure, unspecified: Secondary | ICD-10-CM | POA: Diagnosis not present

## 2023-06-28 DIAGNOSIS — E860 Dehydration: Secondary | ICD-10-CM | POA: Insufficient documentation

## 2023-06-28 DIAGNOSIS — Z743 Need for continuous supervision: Secondary | ICD-10-CM | POA: Diagnosis not present

## 2023-06-28 DIAGNOSIS — N390 Urinary tract infection, site not specified: Secondary | ICD-10-CM | POA: Diagnosis present

## 2023-06-28 DIAGNOSIS — I10 Essential (primary) hypertension: Secondary | ICD-10-CM | POA: Diagnosis not present

## 2023-06-28 DIAGNOSIS — R410 Disorientation, unspecified: Secondary | ICD-10-CM | POA: Diagnosis not present

## 2023-06-28 DIAGNOSIS — I2510 Atherosclerotic heart disease of native coronary artery without angina pectoris: Secondary | ICD-10-CM | POA: Diagnosis present

## 2023-06-28 DIAGNOSIS — D649 Anemia, unspecified: Secondary | ICD-10-CM | POA: Diagnosis not present

## 2023-06-28 DIAGNOSIS — G9340 Encephalopathy, unspecified: Secondary | ICD-10-CM | POA: Diagnosis not present

## 2023-06-28 DIAGNOSIS — E872 Acidosis, unspecified: Secondary | ICD-10-CM | POA: Diagnosis present

## 2023-06-28 DIAGNOSIS — M5442 Lumbago with sciatica, left side: Secondary | ICD-10-CM | POA: Insufficient documentation

## 2023-06-28 DIAGNOSIS — R404 Transient alteration of awareness: Secondary | ICD-10-CM | POA: Diagnosis not present

## 2023-06-28 DIAGNOSIS — R55 Syncope and collapse: Secondary | ICD-10-CM | POA: Diagnosis not present

## 2023-06-28 LAB — COMPREHENSIVE METABOLIC PANEL
ALT: 24 U/L (ref 0–44)
AST: 26 U/L (ref 15–41)
Albumin: 4 g/dL (ref 3.5–5.0)
Alkaline Phosphatase: 78 U/L (ref 38–126)
Anion gap: 11 (ref 5–15)
BUN: 25 mg/dL — ABNORMAL HIGH (ref 8–23)
CO2: 26 mmol/L (ref 22–32)
Calcium: 9.9 mg/dL (ref 8.9–10.3)
Chloride: 103 mmol/L (ref 98–111)
Creatinine, Ser: 1.58 mg/dL — ABNORMAL HIGH (ref 0.44–1.00)
GFR, Estimated: 37 mL/min — ABNORMAL LOW (ref >60)
Glucose, Bld: 233 mg/dL — ABNORMAL HIGH (ref 70–99)
Potassium: 4.6 mmol/L (ref 3.5–5.1)
Sodium: 140 mmol/L (ref 135–145)
Total Bilirubin: 0.5 mg/dL (ref 0.0–1.2)
Total Protein: 7.9 g/dL (ref 6.5–8.1)

## 2023-06-28 LAB — HEMOGLOBIN A1C
Hgb A1c MFr Bld: 6.7 % — ABNORMAL HIGH (ref 4.8–5.6)
Mean Plasma Glucose: 145.59 mg/dL

## 2023-06-28 LAB — URINALYSIS, ROUTINE W REFLEX MICROSCOPIC
Bilirubin Urine: NEGATIVE
Glucose, UA: 150 mg/dL — AB
Ketones, ur: NEGATIVE mg/dL
Nitrite: NEGATIVE
Protein, ur: 100 mg/dL — AB
Specific Gravity, Urine: 1.01 (ref 1.005–1.030)
WBC, UA: >50 WBC/hpf (ref 0–5)
pH: 7 (ref 5.0–8.0)

## 2023-06-28 LAB — RAPID URINE DRUG SCREEN, HOSP PERFORMED
Amphetamines: NOT DETECTED
Barbiturates: NOT DETECTED
Benzodiazepines: POSITIVE — AB
Cocaine: NOT DETECTED
Opiates: NOT DETECTED
Tetrahydrocannabinol: NOT DETECTED

## 2023-06-28 LAB — CBC
HCT: 45.3 % (ref 36.0–46.0)
Hemoglobin: 14.1 g/dL (ref 12.0–15.0)
MCH: 28 pg (ref 26.0–34.0)
MCHC: 31.1 g/dL (ref 30.0–36.0)
MCV: 89.9 fL (ref 80.0–100.0)
Platelets: 264 10*3/uL (ref 150–400)
RBC: 5.04 MIL/uL (ref 3.87–5.11)
RDW: 13.9 % (ref 11.5–15.5)
WBC: 11.8 10*3/uL — ABNORMAL HIGH (ref 4.0–10.5)
nRBC: 0 % (ref 0.0–0.2)

## 2023-06-28 LAB — ETHANOL: Alcohol, Ethyl (B): <10 mg/dL (ref <10)

## 2023-06-28 LAB — CK: Total CK: 483 U/L — ABNORMAL HIGH (ref 38–234)

## 2023-06-28 LAB — CBG MONITORING, ED: Glucose-Capillary: 75 mg/dL (ref 70–99)

## 2023-06-28 LAB — GLUCOSE, CAPILLARY: Glucose-Capillary: 145 mg/dL — ABNORMAL HIGH (ref 70–99)

## 2023-06-28 LAB — LACTIC ACID, PLASMA
Lactic Acid, Venous: 2.7 mmol/L (ref 0.5–1.9)
Lactic Acid, Venous: 1.4 mmol/L (ref 0.5–1.9)

## 2023-06-28 LAB — TSH: TSH: 1.132 u[IU]/mL (ref 0.350–4.500)

## 2023-06-28 MED ORDER — METOPROLOL TARTRATE 50 MG PO TABS
100.0000 mg | ORAL_TABLET | Freq: Two times a day (BID) | ORAL | Status: DC
  Administered 2023-06-28 – 2023-06-29 (×3): 100 mg via ORAL
  Filled 2023-06-28 (×3): qty 2

## 2023-06-28 MED ORDER — INSULIN ASPART 100 UNIT/ML IJ SOLN: 0.0000 [IU] | Freq: Every day | INTRAMUSCULAR | Status: DC

## 2023-06-28 MED ORDER — TAMSULOSIN HCL 0.4 MG PO CAPS: 0.4000 mg | ORAL_CAPSULE | Freq: Every day | ORAL | Status: DC

## 2023-06-28 MED ORDER — ASPIRIN 81 MG PO TBEC
81.0000 mg | DELAYED_RELEASE_TABLET | Freq: Every day | ORAL | Status: DC
  Administered 2023-06-28 – 2023-06-29 (×2): 81 mg via ORAL
  Filled 2023-06-28 (×2): qty 1

## 2023-06-28 MED ORDER — HEPARIN SODIUM (PORCINE) 5000 UNIT/ML IJ SOLN
5000.0000 [IU] | Freq: Three times a day (TID) | INTRAMUSCULAR | Status: DC
  Administered 2023-06-28 – 2023-06-29 (×2): 5000 [IU] via SUBCUTANEOUS
  Filled 2023-06-28 (×2): qty 1

## 2023-06-28 MED ORDER — ACETAMINOPHEN 325 MG PO TABS: 650.0000 mg | ORAL_TABLET | Freq: Four times a day (QID) | ORAL | Status: DC | PRN

## 2023-06-28 MED ORDER — ALPRAZOLAM 0.25 MG PO TABS: 0.2500 mg | ORAL_TABLET | Freq: Two times a day (BID) | ORAL | Status: DC | PRN

## 2023-06-28 MED ORDER — HYDRALAZINE HCL 20 MG/ML IJ SOLN
10.0000 mg | INTRAMUSCULAR | Status: DC | PRN
  Administered 2023-06-29: 10 mg via INTRAVENOUS
  Filled 2023-06-28: qty 1

## 2023-06-28 MED ORDER — HYDROMORPHONE HCL 1 MG/ML IJ SOLN
0.5000 mg | INTRAMUSCULAR | Status: DC | PRN
  Administered 2023-06-28: 0.5 mg via INTRAVENOUS
  Filled 2023-06-28: qty 1

## 2023-06-28 MED ORDER — AMLODIPINE BESYLATE 5 MG PO TABS
10.0000 mg | ORAL_TABLET | Freq: Every day | ORAL | Status: DC
  Administered 2023-06-28 – 2023-06-29 (×2): 10 mg via ORAL
  Filled 2023-06-28 (×2): qty 2

## 2023-06-28 MED ORDER — ACETAMINOPHEN 650 MG RE SUPP
650.0000 mg | Freq: Four times a day (QID) | RECTAL | Status: DC | PRN
Start: 2023-06-28 — End: 2023-06-29

## 2023-06-28 MED ORDER — ONDANSETRON HCL 4 MG/2ML IJ SOLN: 4.0000 mg | Freq: Four times a day (QID) | INTRAMUSCULAR | Status: DC | PRN

## 2023-06-28 MED ORDER — ISOSORBIDE MONONITRATE ER 60 MG PO TB24
60.0000 mg | ORAL_TABLET | Freq: Every day | ORAL | Status: DC
  Administered 2023-06-28: 60 mg via ORAL
  Filled 2023-06-28: qty 1

## 2023-06-28 MED ORDER — CEFTRIAXONE SODIUM 1 G IJ SOLR
1.0000 g | INTRAMUSCULAR | Status: DC
  Administered 2023-06-29: 1 g via INTRAVENOUS
  Filled 2023-06-28: qty 10

## 2023-06-28 MED ORDER — LACTATED RINGERS IV SOLN: INTRAVENOUS | Status: DC

## 2023-06-28 MED ORDER — INSULIN ASPART 100 UNIT/ML IJ SOLN
0.0000 [IU] | Freq: Three times a day (TID) | INTRAMUSCULAR | Status: DC
  Administered 2023-06-29 (×2): 3 [IU] via SUBCUTANEOUS

## 2023-06-28 MED ORDER — CEFTRIAXONE SODIUM 1 G IJ SOLR
1.0000 g | Freq: Once | INTRAMUSCULAR | Status: AC
  Administered 2023-06-28: 1 g via INTRAVENOUS
  Filled 2023-06-28: qty 10

## 2023-06-28 MED ORDER — ONDANSETRON HCL 4 MG PO TABS: 4.0000 mg | ORAL_TABLET | Freq: Four times a day (QID) | ORAL | Status: DC | PRN

## 2023-06-28 MED ORDER — LACTATED RINGERS IV BOLUS
1000.0000 mL | Freq: Once | INTRAVENOUS | Status: AC
  Administered 2023-06-28: 1000 mL via INTRAVENOUS

## 2023-06-28 NOTE — ED Notes (Signed)
Patient offered toileting and something to eat. Patient declined. Took medication without difficulty.

## 2023-06-28 NOTE — ED Triage Notes (Addendum)
Pt brought in by [facility] from home with c/o fall last night and AMS. Pt was last seen normal at 0000 last night when she put her husband to bed. He called for her this morning with no response. Her daughter found her face down with her head between a chair and counter. She was able to get her up in a chair until EMS arrived. When EMS arrived she was able to talk but said she was 63 years old. EMS also reports she was able to ambulate to stretcher. Upon arrival, her extremities are cold, responsive to loud verbal stimuli, and falling asleep during conversation. 18g IV to left ac by EMS. CBG 242, SBP 193 for EMS.

## 2023-06-28 NOTE — ED Provider Notes (Signed)
[place] EMERGENCY DEPARTMENT AT [facility] Provider Note   CSN: [id number] Arrival date & time: 06/28/23  0930     History  Chief Complaint  Patient presents with   Altered Mental Status   Fall    Anita [name] is a 63 y.o. female.  Pt with altered mental status, noted by family this AM. Family member found lying on floor this AM. EMS indicates family was able to get up to chair, and pt was verbally responsive and able to ambulate to stretcher, but that patient appears very tired/lethargic. CBG 242. Pt denies pain or injury. No headache. No neck or back pain. No chest pain or sob. No abd pain or nv. No extremity pain or injury.  Pt unsure how she ended up on floor. Pt limited/poor historian - level 5 caveat.   The history is provided by the patient, medical records and the EMS personnel.  Altered Mental Status Associated symptoms: no abdominal pain, no fever, no headaches, no vomiting and no weakness   Fall Pertinent negatives include no chest pain, no abdominal pain, no headaches and no shortness of breath.       Home Medications Prior to Admission medications   Medication Sig Start Date End Date Taking? Authorizing Provider  acetaminophen (TYLENOL) 325 MG tablet Take 2 tablets (650 mg total) by mouth every 6 (six) hours as needed for mild pain (pain score 1-3) (or Fever >/= 101). 03/20/23   Shon Hale, MD  ALPRAZolam Prudy Feeler) 0.5 MG tablet Take 0.5 tablets (0.25 mg total) by mouth 2 (two) times daily as needed for anxiety. 03/20/23   Shon Hale, MD  amLODipine (NORVASC) 10 MG tablet Take 1 tablet (10 mg total) by mouth daily. 02/16/23   Johnson, Clanford L, MD  aspirin EC 81 MG tablet Take 81 mg by mouth daily. Swallow whole.    [provider]  atorvastatin (LIPITOR) 20 MG tablet Take 1 tablet (20 mg total) by mouth daily. 01/11/23   Jodelle Gross, NP  busPIRone (BUSPAR) 10 MG tablet Take 10 mg by mouth 2 (two) times daily.    [provider]  gabapentin (NEURONTIN) 600 MG tablet Take 1 tablet (600 mg total) by mouth at bedtime. 03/20/23   Shon Hale, MD  glipiZIDE (GLUCOTROL) 5 MG tablet Take 1 tablet (5 mg total) by mouth with breakfast, with lunch, and with evening meal. 02/15/23   Johnson, Clanford L, MD  isosorbide mononitrate (IMDUR) 60 MG 24 hr tablet Take 1 tablet (60 mg total) by mouth daily. 02/15/23 02/15/24  Johnson, Clanford L, MD  metFORMIN (GLUCOPHAGE) 500 MG tablet Take 1,000 mg by mouth 2 (two) times daily with a meal.    [provider]  metoprolol tartrate (LOPRESSOR) 100 MG tablet Take 1 tablet (100 mg total) by mouth 2 (two) times daily. 01/11/23   Jodelle Gross, NP  oxyCODONE (ROXICODONE) 15 MG immediate release tablet Take 1 tablet (15 mg total) by mouth every 8 (eight) hours as needed for pain. Patient taking differently: Take 15 mg by mouth in the morning, at noon, in the evening, and at bedtime. 02/15/23   Johnson, Clanford L, MD  promethazine (PHENERGAN) 25 MG tablet Take 25 mg by mouth 4 (four) times daily. 02/17/23   [provider]  tamsulosin (FLOMAX) 0.4 MG CAPS capsule Take 1 capsule (0.4 mg total) by mouth daily after supper. 03/20/23   Shon Hale, MD      Allergies    Altace [ramipril],  Imitrex [sumatriptan], Lyrica [pregabalin], and Penicillins    Review of Systems   Review of Systems  Constitutional:  Negative for fever.  Eyes:  Negative for visual disturbance.  Respiratory:  Negative for shortness of breath.   Cardiovascular:  Negative for chest pain.  Gastrointestinal:  Negative for abdominal pain and vomiting.  Genitourinary:  Negative for dysuria.  Musculoskeletal:  Negative for back pain and neck pain.  Skin:  Negative for wound.  Neurological:  Negative for weakness, numbness and headaches.    Physical Exam Updated Vital Signs BP (!) 193/68   Pulse 62   Temp 99.4 F (37.4 C) (Rectal)   Resp 15   Ht 1.651 m (5\' 5" )   Wt 54.4 kg    SpO2 95%   BMI 19.96 kg/m  Physical Exam Vitals and nursing note reviewed.  Constitutional:      Appearance: Normal appearance. She is well-developed.  HENT:     Head: Atraumatic.     Nose: Nose normal.     Mouth/Throat:     Mouth: Mucous membranes are moist.  Eyes:     General: No scleral icterus.    Conjunctiva/sclera: Conjunctivae normal.     Pupils: Pupils are equal, round, and reactive to light.  Neck:     Vascular: No carotid bruit.     Trachea: No tracheal deviation.  Cardiovascular:     Rate and Rhythm: Normal rate and regular rhythm.     Pulses: Normal pulses.     Heart sounds: Normal heart sounds. No murmur heard.    No friction rub. No gallop.  Pulmonary:     Effort: Pulmonary effort is normal. No respiratory distress.     Breath sounds: Normal breath sounds.  Chest:     Chest wall: No tenderness.  Abdominal:     General: Bowel sounds are normal. There is no distension.     Palpations: Abdomen is soft.     Tenderness: There is no abdominal tenderness. There is no guarding.  Genitourinary:    Comments: No cva tenderness.  Musculoskeletal:        General: No swelling or tenderness.     Cervical back: Normal range of motion and neck supple. No rigidity or tenderness. No muscular tenderness.     Right lower leg: No edema.     Left lower leg: No edema.     Comments: CTLS spine, non tender, aligned, no step off. Good rom bil extremities without pain or focal bony tenderness.   Skin:    General: Skin is warm and dry.     Findings: No rash.  Neurological:     Mental Status: She is alert.     Comments: Drowsy, slow to respond. Speech is slow but not grossly dysarthric or aphasic. Motor fxn intact bil, stre 5/5. Sens grossly intact.      ED Results / Procedures / Treatments   Labs (all labs ordered are listed, but only abnormal results are displayed) Results for orders placed or performed during the hospital encounter of 06/28/23  Urinalysis, Routine w reflex  microscopic -Urine, Catheterized   Collection Time: 06/28/23  9:46 AM  Result Value Ref Range   Color, Urine YELLOW YELLOW   APPearance CLOUDY (A) CLEAR   Specific Gravity, Urine 1.010 1.005 - 1.030   pH 7.0 5.0 - 8.0   Glucose, UA 150 (A) NEGATIVE mg/dL   Hgb urine dipstick SMALL (A) NEGATIVE   Bilirubin Urine NEGATIVE NEGATIVE   Ketones, ur NEGATIVE NEGATIVE mg/dL  Protein, ur 100 (A) NEGATIVE mg/dL   Nitrite NEGATIVE NEGATIVE   Leukocytes,Ua LARGE (A) NEGATIVE   RBC / HPF 6-10 0 - 5 RBC/hpf   WBC, UA >50 0 - 5 WBC/hpf   Bacteria, UA RARE (A) NONE SEEN   Squamous Epithelial / HPF 0-5 0 - 5 /HPF   WBC Clumps PRESENT    Budding Yeast PRESENT   Rapid urine drug screen (hospital performed)   Collection Time: 06/28/23  9:46 AM  Result Value Ref Range   Opiates NONE DETECTED NONE DETECTED   Cocaine NONE DETECTED NONE DETECTED   Benzodiazepines POSITIVE (A) NONE DETECTED   Amphetamines NONE DETECTED NONE DETECTED   Tetrahydrocannabinol NONE DETECTED NONE DETECTED   Barbiturates NONE DETECTED NONE DETECTED  CBC   Collection Time: 06/28/23  9:54 AM  Result Value Ref Range   WBC 11.8 (H) 4.0 - 10.5 K/uL   RBC 5.04 3.87 - 5.11 MIL/uL   Hemoglobin 14.1 12.0 - 15.0 g/dL   HCT 16.1 09.6 - 04.5 %   MCV 89.9 80.0 - 100.0 fL   MCH 28.0 26.0 - 34.0 pg   MCHC 31.1 30.0 - 36.0 g/dL   RDW 40.9 81.1 - 91.4 %   Platelets 264 150 - 400 K/uL   nRBC 0.0 0.0 - 0.2 %  Comprehensive metabolic panel   Collection Time: 06/28/23  9:54 AM  Result Value Ref Range   Sodium 140 135 - 145 mmol/L   Potassium 4.6 3.5 - 5.1 mmol/L   Chloride 103 98 - 111 mmol/L   CO2 26 22 - 32 mmol/L   Glucose, Bld 233 (H) 70 - 99 mg/dL   BUN 25 (H) 8 - 23 mg/dL   Creatinine, Ser 7.82 (H) 0.44 - 1.00 mg/dL   Calcium 9.9 8.9 - 95.6 mg/dL   Total Protein 7.9 6.5 - 8.1 g/dL   Albumin 4.0 3.5 - 5.0 g/dL   AST 26 15 - 41 U/L   ALT 24 0 - 44 U/L   Alkaline Phosphatase 78 38 - 126 U/L   Total Bilirubin 0.5 0.0 - 1.2  mg/dL   GFR, Estimated 37 (L) >60 mL/min   Anion gap 11 5 - 15  Ethanol   Collection Time: 06/28/23  9:54 AM  Result Value Ref Range   Alcohol, Ethyl (B) <10 <10 mg/dL      EKG None  Radiology CT Head Wo Contrast Result Date: 06/28/2023 CLINICAL DATA:  Mental status change of unknown cause EXAM: CT HEAD WITHOUT CONTRAST TECHNIQUE: Contiguous axial images were obtained from the base of the skull through the vertex without intravenous contrast. RADIATION DOSE REDUCTION: This exam was performed according to the departmental dose-optimization program which includes automated exposure control, adjustment of the mA and/or kV according to patient size and/or use of iterative reconstruction technique. COMPARISON:  03/18/2023 FINDINGS: Brain: No sign of acute infarction. Dilated perivascular space at the base of the brain on the right. Chronic small-vessel ischemic changes of the cerebral hemispheric white matter. No mass, hemorrhage, hydrocephalus or extra-axial collection. Vascular: There is atherosclerotic calcification of the major vessels at the base of the brain. Skull: No skull fracture. Sinuses/Orbits: Clear/normal Other: Apparent posterior scalp bruise in the right posterior parietal region. IMPRESSION: 1. No acute intracranial finding. Chronic small-vessel ischemic changes of the cerebral hemispheric white matter. 2. Apparent posterior scalp bruise in the right posterior parietal region. No skull fracture. Electronically Signed   By: Paulina Fusi M.D.   On: 06/28/2023 10:39  Procedures Procedures    Medications Ordered in ED Medications  cefTRIAXone (ROCEPHIN) 1 g in sodium chloride 0.9 % 100 mL IVPB (has no administration in time range)  lactated ringers bolus 1,000 mL (1,000 mLs Intravenous Bolus 06/28/23 1135)    ED Course/ Medical Decision Making/ A&P                                 Medical Decision Making Problems Addressed: Acute alteration in mental status: acute illness  or injury with systemic symptoms that poses a threat to life or bodily functions Acute metabolic encephalopathy: acute illness or injury with systemic symptoms that poses a threat to life or bodily functions Acute urinary tract infection: acute illness or injury with systemic symptoms AKI (acute kidney injury) (HCC): acute illness or injury Dehydration: acute illness or injury with systemic symptoms Elevated blood pressure reading: acute illness or injury Essential hypertension: chronic illness or injury with exacerbation, progression, or side effects of treatment that poses a threat to life or bodily functions Polypharmacy: chronic illness or injury  Amount and/or Complexity of Data Reviewed Independent Historian: EMS    Details: hx External Data Reviewed: notes. Labs: ordered. Decision-making details documented in ED Course. Radiology: ordered and independent interpretation performed. Decision-making details documented in ED Course. ECG/medicine tests: ordered and independent interpretation performed. Decision-making details documented in ED Course. Discussion of management or test interpretation with external provider(s): medicine  Risk Prescription drug management. Decision regarding hospitalization.   Iv ns. Continuous pulse ox and cardiac monitoring. Labs ordered/sent. Imaging ordered.   Differential diagnosis includes metabolic encephalopathy, medication side effect, polypharmacy, head injury, etc. Dispo decision including potential need for admission considered - will get labs and imaging and reassess.   Reviewed nursing notes and prior charts for additional history. External reports reviewed. Additional history from: EMS.   Prior charts reviewed - two admissions this past fall with symptoms/presentation that appears c/w current, ? Cause then, possibly uti +/- polypharmacy/relative overuse of sedating meds.   Cardiac monitor: sinus rhythm, rate 80.   Labs reviewed/interpreted  by me - wbc mildly elevated. Hct normal. K normal. Mild aki. Ua c/w uti - cultures sent, lactate added. Ivf bolus. Rocephin iv.   CT reviewed/interpreted by me - no hem.   Given altered mental status, weakness, uti, AKI, etc, will consult hospitalist for admission.            Final Clinical Impression(s) / ED Diagnoses Final diagnoses:  Acute alteration in mental status  Acute metabolic encephalopathy  Acute urinary tract infection  Polypharmacy  Elevated blood pressure reading  Essential hypertension    Rx / DC Orders ED Discharge Orders     None         Cathren Laine, MD 06/28/23 1253

## 2023-06-28 NOTE — ED Notes (Signed)
Ultrasound at beside.

## 2023-06-28 NOTE — ED Notes (Signed)
Attempted report x 2
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
## 2023-06-28 NOTE — Plan of Care (Signed)
  Problem: Clinical Measurements: Goal: Respiratory complications will improve Outcome: Progressing Goal: Cardiovascular complication will be avoided Outcome: Progressing   Problem: Coping: Goal: Level of anxiety will decrease Outcome: Progressing   Problem: Pain Managment: Goal: General experience of comfort will improve and/or be controlled Outcome: Progressing   Problem: Safety: Goal: Ability to remain free from injury will improve Outcome: Progressing

## 2023-06-29 ENCOUNTER — Other Ambulatory Visit (HOSPITAL_COMMUNITY): Payer: Self-pay | Admitting: *Deleted

## 2023-06-29 ENCOUNTER — Observation Stay (HOSPITAL_COMMUNITY): Payer: Medicare Other

## 2023-06-29 DIAGNOSIS — G9341 Metabolic encephalopathy: Secondary | ICD-10-CM | POA: Diagnosis not present

## 2023-06-29 LAB — BASIC METABOLIC PANEL
Anion gap: 10 (ref 5–15)
BUN: 20 mg/dL (ref 8–23)
CO2: 25 mmol/L (ref 22–32)
Calcium: 9.5 mg/dL (ref 8.9–10.3)
Chloride: 104 mmol/L (ref 98–111)
Creatinine, Ser: 1.16 mg/dL — ABNORMAL HIGH (ref 0.44–1.00)
GFR, Estimated: 53 mL/min — ABNORMAL LOW (ref >60)
Glucose, Bld: 171 mg/dL — ABNORMAL HIGH (ref 70–99)
Potassium: 4.1 mmol/L (ref 3.5–5.1)
Sodium: 139 mmol/L (ref 135–145)

## 2023-06-29 LAB — CBC
HCT: 39.5 % (ref 36.0–46.0)
Hemoglobin: 12.7 g/dL (ref 12.0–15.0)
MCH: 27.6 pg (ref 26.0–34.0)
MCHC: 32.2 g/dL (ref 30.0–36.0)
MCV: 85.9 fL (ref 80.0–100.0)
Platelets: 291 10*3/uL (ref 150–400)
RBC: 4.6 MIL/uL (ref 3.87–5.11)
RDW: 13.8 % (ref 11.5–15.5)
WBC: 6.8 10*3/uL (ref 4.0–10.5)
nRBC: 0 % (ref 0.0–0.2)

## 2023-06-29 LAB — HIV ANTIBODY (ROUTINE TESTING W REFLEX): HIV Screen 4th Generation wRfx: NONREACTIVE

## 2023-06-29 LAB — GLUCOSE, CAPILLARY
Glucose-Capillary: 204 mg/dL — ABNORMAL HIGH (ref 70–99)
Glucose-Capillary: 205 mg/dL — ABNORMAL HIGH (ref 70–99)

## 2023-06-29 LAB — LACTIC ACID, PLASMA: Lactic Acid, Venous: 1.1 mmol/L (ref 0.5–1.9)

## 2023-06-29 LAB — MAGNESIUM: Magnesium: 1.8 mg/dL (ref 1.7–2.4)

## 2023-06-29 MED ORDER — ORAL CARE MOUTH RINSE: 15.0000 mL | OROMUCOSAL | Status: DC | PRN

## 2023-06-29 MED ORDER — ENSURE ENLIVE PO LIQD
237.0000 mL | Freq: Two times a day (BID) | ORAL | Status: DC
  Administered 2023-06-29: 237 mL via ORAL

## 2023-06-29 MED ORDER — ISOSORBIDE MONONITRATE ER 60 MG PO TB24
90.0000 mg | ORAL_TABLET | Freq: Every day | ORAL | Status: DC
  Administered 2023-06-29: 90 mg via ORAL
  Filled 2023-06-29: qty 1

## 2023-06-29 MED ORDER — CEPHALEXIN 500 MG PO CAPS: 500.0000 mg | ORAL_CAPSULE | Freq: Two times a day (BID) | ORAL | 0 refills | Status: AC

## 2023-06-29 NOTE — TOC CM/SW Note (Signed)
Transition of Care [facility]) - Inpatient Brief Assessment   Patient Details  Name: Anita [name] MRN: [id number] Date of Birth: 07-20-1960  Transition of Care [facility]) CM/SW Contact:    Isabella Bowens, LCSWA Phone Number: 06/29/2023, 9:00 AM   Clinical Narrative: CSW was consulted for SNF placement. PT seen patient and no recommendations were made for SNF placement.   Transition of Care Department [facility]) has reviewed patient and no TOC needs have been identified at this time. We will continue to monitor patient advancement through interdisciplinary progression rounds. If new patient transition needs arise, please place a TOC consult.  Transition of Care Asessment: Insurance and Status: Insurance coverage has been reviewed Patient has primary care physician: Yes Home environment has been reviewed: Single Family Home Prior level of function:: Independent Prior/Current Home Services: No current home services Social Drivers of Health Review: SDOH reviewed no interventions necessary Readmission risk has been reviewed: Yes Transition of care needs: no transition of care needs at this time

## 2023-06-29 NOTE — Evaluation (Addendum)
Physical Therapy Brief Evaluation and Discharge Note Patient Details Name: Anita [name] MRN: [id number] DOB: [birth year] Today's Date: 06/29/2023   History of Present Illness  a 63 y.o. female with medical history significant for hypertension, type 2 diabetes, CAD, dyslipidemia, chronic pain, peripheral neuropathy, prior CVA, NAFLD, migraine headaches, anxiety, and anemia who presented to the ED after family members found patient on the floor this morning.  EMS was called and she was able to get up to chair and was verbally responsive and able to ambulate to the stretcher, but was overall very lethargic.  CBG was 242.  She denies any pain or injury.  She does not recall how she ended up on the floor.  Clinical Impression   Pt pleasantly agreeable to therapy evaluation. Patient is performing at functional baseline, no safety concerns noted throughout evaluation.  Patient is safe to DC home with appropriate services, DME, and any other needs indicated.  PT to sign off at this time.  Please reconsult acute PT services if functional changes occur while admitted.  Thank you.       PT Assessment    Assistance Needed at Discharge       Equipment Recommendations None recommended by PT  Recommendations for Other Services       Precautions/Restrictions Restrictions Weight Bearing Restrictions Per Provider Order: No        Mobility  Bed Mobility          Transfers Overall transfer level: Independent Equipment used: None               General transfer comment: sitting EOB pt independently donned/doffed socks.    Ambulation/Gait Ambulation/Gait assistance: Independent Gait Distance (Feet): 50 Feet Assistive device: None Gait Pattern/deviations: WFL(Within Functional Limits)   General Gait Details: safe but guarded ambulation with appropriate turning during ambulation in [place].  Home Activity Instructions    Stairs            Modified Rankin (Stroke Patients Only)         Balance                          Pertinent Vitals/Pain   Pain Assessment Pain Assessment: Faces Faces Pain Scale: No hurt     Home Living   Living Arrangements: Spouse/significant other       Home Equipment: Rolling Walker (2 wheels);Cane - single point;BSC/3in1;Shower seat        Prior Function        UE/LE Assessment               Communication   Communication Communication: No apparent difficulties     Cognition         General Comments      Exercises     Assessment/Plan    PT Problem List         PT Visit Diagnosis Unsteadiness on feet (R26.81);Repeated falls (R29.6)    No Skilled PT Patient is independent with all acitivity/mobility   Co-evaluation                AMPAC 6 Clicks Help needed turning from your back to your side while in a flat bed without using bedrails?: None Help needed moving from lying on your back to sitting on the side of a flat bed without using bedrails?: None Help needed moving to and from a bed to a chair (including a wheelchair)?: None Help needed standing up from a chair  using your arms (e.g., wheelchair or bedside chair)?: None Help needed to walk in hospital room?: None Help needed climbing 3-5 steps with a railing? : None 6 Click Score: 24      End of Session Equipment Utilized During Treatment: Gait belt Activity Tolerance: No increased pain Patient left: in bed;with call bell/phone within reach;with bed alarm set Nurse Communication: Mobility status PT Visit Diagnosis: Unsteadiness on feet (R26.81);Repeated falls (R29.6)     Time: 1610-9604 PT Time Calculation (min) (ACUTE ONLY): 16 min  Charges:   PT Evaluation $PT Eval Low Complexity: 1 Low      Elie Goody, DPT [facility] [place] [phone] office  Nelida Meuse  06/29/2023, 8:51 AM

## 2023-06-29 NOTE — Progress Notes (Signed)
OT Cancellation Note  Patient Details Name: MELANNY WIRE MRN: [id number] DOB: 05-12-1961   Cancelled Treatment:    Reason Eval/Treat Not Completed: OT screened, no needs identified, will sign off. Pt reportedly did well during physical therapy evaluation and independent with mobility and lower body dressing. Pt reported has WFL B UE A/ROM.   Hilbert Briggs OT, MOT  Danie Chandler 06/29/2023, 9:10 AM

## 2023-06-29 NOTE — Discharge Summary (Signed)
Physician Discharge Summary  Anita [name] ZOX:[id number] DOB: Sep 03, 1960 DOA: 06/28/2023  PCP: Elfredia Nevins, MD  Admit date: 06/28/2023 Discharge date: 06/29/2023  Admitted From: Home Disposition: Home  Recommendations for Outpatient Follow-up:  Follow up with PCP in 1-2 weeks Continue Keflex to complete antibiotic course for UTI Please follow up on the following pending results: Finalized results from urine culture and blood culture  Home Health: No needs identified by therapy while inpatient Equipment/Devices: None  Discharge Condition: Stable CODE STATUS: Full code Diet recommendation: Heart healthy/consistent carbohydrate diet  History of present illness:  Anita [name] is a 63 y.o. female with medical history significant for hypertension, type 2 diabetes, CAD, dyslipidemia, chronic pain, peripheral neuropathy, prior CVA, NAFLD, migraine headaches, anxiety, and anemia who presented to the ED after family members found patient on the floor this morning.  EMS was called and she was able to get up to chair and was verbally responsive and able to ambulate to the stretcher, but was overall very lethargic.  CBG was 242.  She denies any pain or injury.  She does not recall how she ended up on the floor.   In the ED, temperature 99.4 F, HR 64, RR 17, BP 190/68, SpO2 97% on room air.  WBC 11.8, hemoglobin 14.1, platelet count 264.  Sodium 140, potassium 4.6, chloride 103, CO2 26, glucose 233, BUN 25, creat 1.58.  AST 26, ALT 24, total bilirubin 0.5.  CK 483.  TSH 1.132.  Urinalysis with large leukocytes, negative nitrite, rare bacteria, greater than 50 WBCs.  EtOH level less than 10.  UDS positive for benzodiazepines.  CT head without contrast with no acute intracranial finding, chronic small vessel ischemic changes of the cerebral hemispheric white matter, apparent posterior scalp bruise in the right posterior parietal region with no skull fracture.  Blood cultures x 2 and urine culture  obtained.   Patient given 1 L fluid bolus and started on Rocephin empirically with cultures obtained.  CT head with no acute findings.  TRH consulted for admission for further evaluation management of acute metabolic encephalopathy likely secondary to urinary tract infection.  Hospital course:  Acute metabolic encephalopathy Patient presenting after being found by family confused.  CT head without contrast with no acute findings.  EtOH level less than 10.  UDS positive for benzodiazepines.  TSH within normal limits.  Carotid ultrasound with no acute findings.  Urinalysis consistent with UTI, etiology likely secondary to urinary tract infection.  Will start IV antibiotics with resolution of encephalopathy; with mentation now back at baseline.  Continue treatment as below for urinary tract infection.  Urinary tract infection Patient presenting with confusion as above.  Patient was afebrile with elevated WBC count of 11.8.  Urinalysis with large leukocytes, negative nitrite, greater than 50 WBCs.  Review of EMR shows previous E. coli UTI.  Patient was initially started on IV ceftriaxone with improvement of her mentation and white blood cell count improvement to 6.8.  Patient desires discharge home and will continue Keflex 500 mg p.o. twice daily to complete 5-day course.  Outpatient follow-up PCP 1 week.  Recommend follow-up finalized urine culture and blood cultures which was pending at time of discharge.  Essential hypertension Amlodipine 10 mg p.o. daily, isosorbide mononitrate 60 mg p.o. daily, metoprolol tartrate 100 mg p.o. twice daily.  Continue aspirin and statin.  Outpatient follow-up with PCP.  May need adjustments in antihypertensive regimen given elevated blood pressure on admission.  Hyperlipidemia Atorvastatin 20 mg p.o. daily  Type 2 diabetes mellitus Glipizide 5 mg p.o. 3 times daily, metformin 1000 mg p.o. twice daily.  Chronic pain Peripheral neuropathy Gabapentin 600 mg p.o.  nightly, oxycodone 15 mg QID PRN  Anxiety Buspirone 10 mg p.o. twice daily, alprazolam BID PRN  Discharge Diagnoses:  Principal Problem:   Acute metabolic encephalopathy Active Problems:   CAD (coronary artery disease), 30% LAD 9/12   HTN (hypertension)   Diabetes mellitus with neuropathy (HCC)   Dyslipidemia   Chronic bilateral low back pain with left-sided sciatica   UTI (urinary tract infection)   Anxiety   Chronic pain syndrome   Lactic acidosis    Discharge Instructions  Discharge Instructions     Call MD for:  difficulty breathing, headache or visual disturbances   Complete by: As directed    Call MD for:  extreme fatigue   Complete by: As directed    Call MD for:  persistant dizziness or light-headedness   Complete by: As directed    Call MD for:  persistant nausea and vomiting   Complete by: As directed    Call MD for:  severe uncontrolled pain   Complete by: As directed    Call MD for:  temperature >100.4   Complete by: As directed    Diet - low sodium heart healthy   Complete by: As directed    Increase activity slowly   Complete by: As directed       Allergies as of 06/29/2023       Reactions   Altace [ramipril] Swelling   Possible ACE-I induced angioedema    Imitrex [sumatriptan] Swelling   Swelling in neck   Lyrica [pregabalin] Other (See Comments)   Lower extremity swelling   Penicillins Rash        Medication List     TAKE these medications    acetaminophen 325 MG tablet Commonly known as: TYLENOL Take 2 tablets (650 mg total) by mouth every 6 (six) hours as needed for mild pain (pain score 1-3) (or Fever >/= 101).   ALPRAZolam 0.5 MG tablet Commonly known as: XANAX Take 0.5 tablets (0.25 mg total) by mouth 2 (two) times daily as needed for anxiety.   amLODipine 10 MG tablet Commonly known as: NORVASC Take 1 tablet (10 mg total) by mouth daily.   aspirin EC 81 MG tablet Take 81 mg by mouth daily. Swallow whole.   atorvastatin  20 MG tablet Commonly known as: LIPITOR Take 1 tablet (20 mg total) by mouth daily.   busPIRone 10 MG tablet Commonly known as: BUSPAR Take 10 mg by mouth 2 (two) times daily.   cephALEXin 500 MG capsule Commonly known as: KEFLEX Take 1 capsule (500 mg total) by mouth 2 (two) times daily for 5 days.   gabapentin 600 MG tablet Commonly known as: NEURONTIN Take 1 tablet (600 mg total) by mouth at bedtime.   glipiZIDE 5 MG tablet Commonly known as: GLUCOTROL Take 1 tablet (5 mg total) by mouth with breakfast, with lunch, and with evening meal.   isosorbide mononitrate 60 MG 24 hr tablet Commonly known as: IMDUR Take 1 tablet (60 mg total) by mouth daily.   metFORMIN 500 MG tablet Commonly known as: GLUCOPHAGE Take 1,000 mg by mouth 2 (two) times daily with a meal.   metoprolol tartrate 100 MG tablet Commonly known as: LOPRESSOR Take 1 tablet (100 mg total) by mouth 2 (two) times daily.   oxyCODONE 15 MG immediate release tablet Commonly known as: ROXICODONE Take 1  tablet (15 mg total) by mouth every 8 (eight) hours as needed for pain. What changed: when to take this   promethazine 25 MG tablet Commonly known as: PHENERGAN Take 25 mg by mouth 4 (four) times daily.   tamsulosin 0.4 MG Caps capsule Commonly known as: FLOMAX Take 1 capsule (0.4 mg total) by mouth daily after supper.        Follow-up Information     Elfredia Nevins, MD. Schedule an appointment as soon as possible for a visit in 1 week(s).   Specialty: Internal Medicine Contact information: [address] [place] [address] [phone]                Allergies  Allergen Reactions   Altace [Ramipril] Swelling    Possible ACE-I induced angioedema    Imitrex [Sumatriptan] Swelling    Swelling in neck   Lyrica [Pregabalin] Other (See Comments)    Lower extremity swelling   Penicillins Rash    Consultations: None   Procedures/Studies: US Carotid Bilateral Result Date:  06/28/2023 CLINICAL DATA:  Syncope EXAM: BILATERAL CAROTID DUPLEX ULTRASOUND TECHNIQUE: Wallace Cullens scale imaging, color Doppler and duplex ultrasound were performed of bilateral carotid and vertebral arteries in the neck. COMPARISON:  None Available. FINDINGS: Criteria: Quantification of carotid stenosis is based on velocity parameters that correlate the residual internal carotid diameter with NASCET-based stenosis levels, using the diameter of the distal internal carotid lumen as the denominator for stenosis measurement. The following velocity measurements were obtained: RIGHT ICA: 91/19 cm/sec CCA: 58/12 cm/sec SYSTOLIC ICA/CCA RATIO:  1.6 ECA:  132 cm/sec LEFT ICA: 70/20 cm/sec CCA: 71/10 cm/sec SYSTOLIC ICA/CCA RATIO:  1.0 ECA:  94 cm/sec RIGHT CAROTID ARTERY: No evidence of atherosclerotic plaque or stenosis. RIGHT VERTEBRAL ARTERY:  Patent with normal antegrade flow. LEFT CAROTID ARTERY: No evidence of atherosclerotic plaque or stenosis. LEFT VERTEBRAL ARTERY:  Patent with normal antegrade flow. IMPRESSION: Negative bilateral carotid duplex ultrasound. Electronically Signed   By: Malachy Moan M.D.   On: 06/28/2023 16:24   CT Head Wo Contrast Result Date: 06/28/2023 CLINICAL DATA:  Mental status change of unknown cause EXAM: CT HEAD WITHOUT CONTRAST TECHNIQUE: Contiguous axial images were obtained from the base of the skull through the vertex without intravenous contrast. RADIATION DOSE REDUCTION: This exam was performed according to the departmental dose-optimization program which includes automated exposure control, adjustment of the mA and/or kV according to patient size and/or use of iterative reconstruction technique. COMPARISON:  03/18/2023 FINDINGS: Brain: No sign of acute infarction. Dilated perivascular space at the base of the brain on the right. Chronic small-vessel ischemic changes of the cerebral hemispheric white matter. No mass, hemorrhage, hydrocephalus or extra-axial collection. Vascular:  There is atherosclerotic calcification of the major vessels at the base of the brain. Skull: No skull fracture. Sinuses/Orbits: Clear/normal Other: Apparent posterior scalp bruise in the right posterior parietal region. IMPRESSION: 1. No acute intracranial finding. Chronic small-vessel ischemic changes of the cerebral hemispheric white matter. 2. Apparent posterior scalp bruise in the right posterior parietal region. No skull fracture. Electronically Signed   By: Paulina Fusi M.D.   On: 06/28/2023 10:39     Subjective: Patient seen examined bedside, resting calmly.  Lying in bed.  Mental status now back at baseline.  Patient desires discharge home this morning.  Discussed will continue antibiotics on discharge for treatment of UTI.  Recommended outpatient follow-up with PCP in 1 week for follow-up regarding hospitalization as well as finalized culture results.  Patient with no other  specific questions, concerns or complaints at this time.  Denies headache, no visual changes, no chest pain, no palpitations, no shortness of breath, no abdominal pain, no fever/chills/night sweats, no nausea/vomiting/diarrhea, no focal weakness, no fatigue, no paresthesia.  No acute events overnight per nursing staff.  Discharge Exam: Vitals:   06/29/23 0532 06/29/23 0600  BP: (!) 190/68 (!) 159/60  Pulse: 60   Resp:  15  Temp: 98.1 F (36.7 C)   SpO2: 97%    Vitals:   06/28/23 2101 06/28/23 2200 06/29/23 0532 06/29/23 0600  BP: (!) 156/70  (!) 190/68 (!) 159/60  Pulse: 66  60   Resp:    15  Temp: 98.5 F (36.9 C)  98.1 F (36.7 C)   TempSrc: Oral  Oral   SpO2: 96% 96% 97%   Weight:      Height:        Physical Exam: GEN: NAD, alert and oriented x 3, chronically ill appearance, appears older than stated age HEENT: NCAT, PERRL, EOMI, sclera clear, MMM PULM: CTAB w/o wheezes/crackles, normal respiratory effort, on room air CV: RRR w/o M/G/R GI: abd soft, NTND, NABS, no R/G/M MSK: no peripheral edema,  muscle strength globally intact 5/5 bilateral upper/lower extremities NEURO: CN II-XII intact, no focal deficits, sensation to light touch intact PSYCH: normal mood/affect Integumentary: dry/intact, no rashes or wounds    The results of significant diagnostics from this hospitalization (including imaging, microbiology, ancillary and laboratory) are listed below for reference.     Microbiology: Recent Results (from the past 240 hours)  Blood culture (routine x 2)     Status: None (Preliminary result)   Collection Time: 06/28/23  1:16 PM   Specimen: BLOOD  Result Value Ref Range Status   Specimen Description BLOOD LW  Final   Special Requests   Final    BOTTLES DRAWN AEROBIC ONLY Blood Culture results may not be optimal due to an inadequate volume of blood received in culture bottles   Culture   Final    NO GROWTH < 24 HOURS Performed at [facility], [address], [place], [place] [address]    Report Status PENDING  Incomplete  Blood culture (routine x 2)     Status: None (Preliminary result)   Collection Time: 06/28/23  1:16 PM   Specimen: BLOOD  Result Value Ref Range Status   Specimen Description BLOOD BLOOD LEFT HAND  Final   Special Requests   Final    BOTTLES DRAWN AEROBIC ONLY Blood Culture results may not be optimal due to an inadequate volume of blood received in culture bottles   Culture   Final    NO GROWTH < 24 HOURS Performed at [facility], [address], [place], [place] [address]    Report Status PENDING  Incomplete     Labs: BNP (last 3 results) No results for input(s): "BNP" in the last 8760 hours. Basic Metabolic Panel: Recent Labs  Lab 06/28/23 0954 06/29/23 0446  NA 140 139  K 4.6 4.1  CL 103 104  CO2 26 25  GLUCOSE 233* 171*  BUN 25* 20  CREATININE 1.58* 1.16*  CALCIUM 9.9 9.5  MG  --  1.8   Liver Function Tests: Recent Labs  Lab 06/28/23 0954  AST 26  ALT 24  ALKPHOS 78  BILITOT 0.5  PROT 7.9  ALBUMIN 4.0   No results for  input(s): "LIPASE", "AMYLASE" in the last 168 hours. No results for input(s): "AMMONIA" in the last 168 hours.  CBC: Recent Labs  Lab 06/28/23 0954 06/29/23 0446  WBC 11.8* 6.8  HGB 14.1 12.7  HCT 45.3 39.5  MCV 89.9 85.9  PLT 264 291   Cardiac Enzymes: Recent Labs  Lab 06/28/23 0954  CKTOTAL 483*   BNP: Invalid input(s): "POCBNP" CBG: Recent Labs  Lab 06/28/23 1708 06/28/23 2035 06/29/23 0732  GLUCAP 75 145* 204*   D-Dimer No results for input(s): "DDIMER" in the last 72 hours. Hgb A1c Recent Labs    06/28/23 0954  HGBA1C 6.7*   Lipid Profile No results for input(s): "CHOL", "HDL", "LDLCALC", "TRIG", "CHOLHDL", "LDLDIRECT" in the last 72 hours. Thyroid function studies Recent Labs    06/28/23 0954  TSH 1.132   Anemia work up No results for input(s): "VITAMINB12", "FOLATE", "FERRITIN", "TIBC", "IRON", "RETICCTPCT" in the last 72 hours. Urinalysis    Component Value Date/Time   COLORURINE YELLOW 06/28/2023 0946   APPEARANCEUR CLOUDY (A) 06/28/2023 0946   LABSPEC 1.010 06/28/2023 0946   PHURINE 7.0 06/28/2023 0946   GLUCOSEU 150 (A) 06/28/2023 0946   HGBUR SMALL (A) 06/28/2023 0946   BILIRUBINUR NEGATIVE 06/28/2023 0946   KETONESUR NEGATIVE 06/28/2023 0946   PROTEINUR 100 (A) 06/28/2023 0946   UROBILINOGEN 0.2 02/04/2015 1915   NITRITE NEGATIVE 06/28/2023 0946   LEUKOCYTESUR LARGE (A) 06/28/2023 0946   Sepsis Labs Recent Labs  Lab 06/28/23 0954 06/29/23 0446  WBC 11.8* 6.8   Microbiology Recent Results (from the past 240 hours)  Blood culture (routine x 2)     Status: None (Preliminary result)   Collection Time: 06/28/23  1:16 PM   Specimen: BLOOD  Result Value Ref Range Status   Specimen Description BLOOD LW  Final   Special Requests   Final    BOTTLES DRAWN AEROBIC ONLY Blood Culture results may not be optimal due to an inadequate volume of blood received in culture bottles   Culture   Final    NO GROWTH < 24 HOURS Performed at [facility], [address] [place], [place] [address]    Report Status PENDING  Incomplete  Blood culture (routine x 2)     Status: None (Preliminary result)   Collection Time: 06/28/23  1:16 PM   Specimen: BLOOD  Result Value Ref Range Status   Specimen Description BLOOD BLOOD LEFT HAND  Final   Special Requests   Final    BOTTLES DRAWN AEROBIC ONLY Blood Culture results may not be optimal due to an inadequate volume of blood received in culture bottles   Culture   Final    NO GROWTH < 24 HOURS Performed at [facility], [address] [place], [place] [address]    Report Status PENDING  Incomplete     Time coordinating discharge: Over 30 minutes  SIGNED:   Alvira Philips [place], DO  [facility] 06/29/2023, 9:30 AM

## 2023-06-29 NOTE — Care Management CC44 (Signed)
Condition Code 44 Documentation Completed  Patient Details  Name: Anita [name] MRN: [id number] Date of Birth: 1960-11-06   Condition Code 44 given:  Yes Patient signature on Condition Code 44 notice:  Yes Documentation of 2 MD's agreement:  Yes Code 44 added to claim:  Yes    Isabella Bowens, LCSWA 06/29/2023, 10:23 AM

## 2023-06-29 NOTE — Care Management Obs Status (Signed)
MEDICARE OBSERVATION STATUS NOTIFICATION   Patient Details  Name: Anita [name] MRN: [id number] Date of Birth: 1960-11-17   Medicare Observation Status Notification Given:  Yes    Isabella Bowens, LCSWA 06/29/2023, 10:22 AM

## 2023-06-29 NOTE — Progress Notes (Signed)
Patient has been discharged and Dr. [place] said echo not needed.     Celesta Gentile, RCS

## 2023-06-30 LAB — URINE CULTURE: Culture: >=100000 — AB

## 2023-07-03 LAB — CULTURE, BLOOD (ROUTINE X 2)
Culture: NO GROWTH
Culture: NO GROWTH

## 2023-07-11 DIAGNOSIS — E1140 Type 2 diabetes mellitus with diabetic neuropathy, unspecified: Secondary | ICD-10-CM | POA: Diagnosis not present

## 2023-07-11 DIAGNOSIS — Z6822 Body mass index (BMI) 22.0-22.9, adult: Secondary | ICD-10-CM | POA: Diagnosis not present

## 2023-07-11 DIAGNOSIS — I700 Atherosclerosis of aorta: Secondary | ICD-10-CM | POA: Diagnosis not present

## 2023-07-11 DIAGNOSIS — E1165 Type 2 diabetes mellitus with hyperglycemia: Secondary | ICD-10-CM | POA: Diagnosis not present

## 2023-07-11 DIAGNOSIS — N390 Urinary tract infection, site not specified: Secondary | ICD-10-CM | POA: Diagnosis not present

## 2023-07-11 DIAGNOSIS — I129 Hypertensive chronic kidney disease with stage 1 through stage 4 chronic kidney disease, or unspecified chronic kidney disease: Secondary | ICD-10-CM | POA: Diagnosis not present

## 2023-07-11 DIAGNOSIS — G894 Chronic pain syndrome: Secondary | ICD-10-CM | POA: Diagnosis not present

## 2023-08-31 DIAGNOSIS — I129 Hypertensive chronic kidney disease with stage 1 through stage 4 chronic kidney disease, or unspecified chronic kidney disease: Secondary | ICD-10-CM | POA: Diagnosis not present

## 2023-08-31 DIAGNOSIS — E1165 Type 2 diabetes mellitus with hyperglycemia: Secondary | ICD-10-CM | POA: Diagnosis not present

## 2023-08-31 DIAGNOSIS — N390 Urinary tract infection, site not specified: Secondary | ICD-10-CM | POA: Diagnosis not present

## 2023-08-31 DIAGNOSIS — I700 Atherosclerosis of aorta: Secondary | ICD-10-CM | POA: Diagnosis not present

## 2023-08-31 DIAGNOSIS — R269 Unspecified abnormalities of gait and mobility: Secondary | ICD-10-CM | POA: Diagnosis not present

## 2023-08-31 DIAGNOSIS — Z6822 Body mass index (BMI) 22.0-22.9, adult: Secondary | ICD-10-CM | POA: Diagnosis not present

## 2023-08-31 DIAGNOSIS — E1140 Type 2 diabetes mellitus with diabetic neuropathy, unspecified: Secondary | ICD-10-CM | POA: Diagnosis not present

## 2023-08-31 DIAGNOSIS — G894 Chronic pain syndrome: Secondary | ICD-10-CM | POA: Diagnosis not present

## 2023-09-30 DIAGNOSIS — G894 Chronic pain syndrome: Secondary | ICD-10-CM | POA: Diagnosis not present

## 2023-09-30 DIAGNOSIS — R269 Unspecified abnormalities of gait and mobility: Secondary | ICD-10-CM | POA: Diagnosis not present

## 2023-09-30 DIAGNOSIS — I129 Hypertensive chronic kidney disease with stage 1 through stage 4 chronic kidney disease, or unspecified chronic kidney disease: Secondary | ICD-10-CM | POA: Diagnosis not present

## 2023-09-30 DIAGNOSIS — E1140 Type 2 diabetes mellitus with diabetic neuropathy, unspecified: Secondary | ICD-10-CM | POA: Diagnosis not present

## 2023-09-30 DIAGNOSIS — E1165 Type 2 diabetes mellitus with hyperglycemia: Secondary | ICD-10-CM | POA: Diagnosis not present

## 2023-10-31 DIAGNOSIS — E1165 Type 2 diabetes mellitus with hyperglycemia: Secondary | ICD-10-CM | POA: Diagnosis not present

## 2023-10-31 DIAGNOSIS — I739 Peripheral vascular disease, unspecified: Secondary | ICD-10-CM | POA: Diagnosis not present

## 2023-10-31 DIAGNOSIS — G894 Chronic pain syndrome: Secondary | ICD-10-CM | POA: Diagnosis not present

## 2023-10-31 DIAGNOSIS — I129 Hypertensive chronic kidney disease with stage 1 through stage 4 chronic kidney disease, or unspecified chronic kidney disease: Secondary | ICD-10-CM | POA: Diagnosis not present

## 2023-10-31 DIAGNOSIS — E1140 Type 2 diabetes mellitus with diabetic neuropathy, unspecified: Secondary | ICD-10-CM | POA: Diagnosis not present

## 2023-10-31 DIAGNOSIS — R269 Unspecified abnormalities of gait and mobility: Secondary | ICD-10-CM | POA: Diagnosis not present

## 2023-10-31 DIAGNOSIS — Z6822 Body mass index (BMI) 22.0-22.9, adult: Secondary | ICD-10-CM | POA: Diagnosis not present

## 2023-10-31 DIAGNOSIS — I70219 Atherosclerosis of native arteries of extremities with intermittent claudication, unspecified extremity: Secondary | ICD-10-CM | POA: Diagnosis not present

## 2023-11-02 ENCOUNTER — Other Ambulatory Visit (HOSPITAL_COMMUNITY): Payer: Self-pay | Admitting: Internal Medicine

## 2023-11-02 DIAGNOSIS — I70219 Atherosclerosis of native arteries of extremities with intermittent claudication, unspecified extremity: Secondary | ICD-10-CM

## 2023-12-03 ENCOUNTER — Other Ambulatory Visit (HOSPITAL_COMMUNITY): Payer: Self-pay

## 2023-12-05 ENCOUNTER — Encounter (HOSPITAL_COMMUNITY): Payer: Self-pay

## 2023-12-05 ENCOUNTER — Ambulatory Visit (HOSPITAL_COMMUNITY): Admission: RE | Admit: 2023-12-05 | Source: Ambulatory Visit

## 2023-12-19 ENCOUNTER — Telehealth: Payer: Self-pay | Admitting: Adult Health

## 2023-12-19 NOTE — Telephone Encounter (Signed)
 Pt c/o of Chest Pain: STAT if active (IN THIS MOMENT) CP, including tightness, pressure, jaw pain, shoulder/upper arm/back pain, SOB, nausea, and vomiting.  1. Are you having CP right now (tightness, pressure, or discomfort)? No  2. Are you experiencing any other symptoms (ex. SOB, nausea, vomiting, sweating)? No  3. How long have you been experiencing CP? Started two weeks ago   4. Is your CP continuous or coming and going? Coming and going   5. Have you taken Nitroglycerin ? No   6. If CP returns before callback, please consider calling 911. ?

## 2023-12-19 NOTE — Telephone Encounter (Addendum)
 Called patient back about her message. Patient stated that last week she was watching TV and had sharp, pressure, like squeezing in her chest last week that lasted for 30 minutes. Patient stated this has been having off and on, and last time it happened was yesterday. Patient stated she if fine right now. Tried to offer patient an appointment with APP tomorrow, but she declined,  saying she has to rely on her daughter who is out of town right now for transportation. Encouraged patient to go to ED or call 911 if she has the pressure/squeezing in her chest again. Will send message to Jerilynn Collar NP for advisement. Patient has appointment with Dr. Court in August, patient was a Dr. Burnard patient.

## 2023-12-19 NOTE — Telephone Encounter (Signed)
 Patient is returning call. Please advise?

## 2023-12-19 NOTE — Telephone Encounter (Signed)
 Left voice message to call back 7/21

## 2023-12-20 DIAGNOSIS — R269 Unspecified abnormalities of gait and mobility: Secondary | ICD-10-CM | POA: Diagnosis not present

## 2023-12-20 DIAGNOSIS — I129 Hypertensive chronic kidney disease with stage 1 through stage 4 chronic kidney disease, or unspecified chronic kidney disease: Secondary | ICD-10-CM | POA: Diagnosis not present

## 2023-12-20 DIAGNOSIS — G894 Chronic pain syndrome: Secondary | ICD-10-CM | POA: Diagnosis not present

## 2023-12-20 DIAGNOSIS — E1140 Type 2 diabetes mellitus with diabetic neuropathy, unspecified: Secondary | ICD-10-CM | POA: Diagnosis not present

## 2023-12-21 NOTE — Telephone Encounter (Signed)
 Left message for pt to call.

## 2023-12-22 NOTE — Telephone Encounter (Signed)
 Called and left detailed message that Anita [name] is in agreement to go to ER if pain recurs, or call office for sooner availability if anything further is needed.

## 2024-01-11 ENCOUNTER — Ambulatory Visit: Admitting: Cardiovascular Disease

## 2024-01-24 DIAGNOSIS — G894 Chronic pain syndrome: Secondary | ICD-10-CM | POA: Diagnosis not present

## 2024-01-24 DIAGNOSIS — E1140 Type 2 diabetes mellitus with diabetic neuropathy, unspecified: Secondary | ICD-10-CM | POA: Diagnosis not present

## 2024-01-24 DIAGNOSIS — I129 Hypertensive chronic kidney disease with stage 1 through stage 4 chronic kidney disease, or unspecified chronic kidney disease: Secondary | ICD-10-CM | POA: Diagnosis not present

## 2024-01-24 DIAGNOSIS — R269 Unspecified abnormalities of gait and mobility: Secondary | ICD-10-CM | POA: Diagnosis not present

## 2024-01-24 DIAGNOSIS — Z6822 Body mass index (BMI) 22.0-22.9, adult: Secondary | ICD-10-CM | POA: Diagnosis not present

## 2024-01-24 DIAGNOSIS — I739 Peripheral vascular disease, unspecified: Secondary | ICD-10-CM | POA: Diagnosis not present

## 2024-01-29 DIAGNOSIS — I129 Hypertensive chronic kidney disease with stage 1 through stage 4 chronic kidney disease, or unspecified chronic kidney disease: Secondary | ICD-10-CM | POA: Diagnosis not present

## 2024-01-29 DIAGNOSIS — E1140 Type 2 diabetes mellitus with diabetic neuropathy, unspecified: Secondary | ICD-10-CM | POA: Diagnosis not present

## 2024-01-29 DIAGNOSIS — M1991 Primary osteoarthritis, unspecified site: Secondary | ICD-10-CM | POA: Diagnosis not present

## 2024-01-29 DIAGNOSIS — I70219 Atherosclerosis of native arteries of extremities with intermittent claudication, unspecified extremity: Secondary | ICD-10-CM | POA: Diagnosis not present

## 2024-02-03 ENCOUNTER — Other Ambulatory Visit: Payer: Self-pay

## 2024-02-03 ENCOUNTER — Encounter (HOSPITAL_COMMUNITY)
Admission: EM | Disposition: A | Discharge status: Home / Self Care | Payer: Self-pay | Source: Home / Self Care | Attending: Internal Medicine

## 2024-02-03 ENCOUNTER — Emergency Department (HOSPITAL_COMMUNITY)

## 2024-02-03 ENCOUNTER — Emergency Department (HOSPITAL_COMMUNITY): Admitting: Registered Nurse

## 2024-02-03 ENCOUNTER — Inpatient Hospital Stay (HOSPITAL_COMMUNITY)
Admission: EM | Admit: 2024-02-03 | Discharge: 2024-02-08 | DRG: 854 | Disposition: A | Discharge status: Home / Self Care | Attending: Internal Medicine | Admitting: Internal Medicine

## 2024-02-03 DIAGNOSIS — Z87891 Personal history of nicotine dependence: Secondary | ICD-10-CM | POA: Diagnosis not present

## 2024-02-03 DIAGNOSIS — R0689 Other abnormalities of breathing: Secondary | ICD-10-CM | POA: Diagnosis not present

## 2024-02-03 DIAGNOSIS — A419 Sepsis, unspecified organism: Principal | ICD-10-CM | POA: Diagnosis present

## 2024-02-03 DIAGNOSIS — E1142 Type 2 diabetes mellitus with diabetic polyneuropathy: Secondary | ICD-10-CM | POA: Diagnosis not present

## 2024-02-03 DIAGNOSIS — N136 Pyonephrosis: Secondary | ICD-10-CM | POA: Diagnosis not present

## 2024-02-03 DIAGNOSIS — Z823 Family history of stroke: Secondary | ICD-10-CM

## 2024-02-03 DIAGNOSIS — N10 Acute tubulo-interstitial nephritis: Secondary | ICD-10-CM

## 2024-02-03 DIAGNOSIS — N179 Acute kidney failure, unspecified: Secondary | ICD-10-CM | POA: Diagnosis present

## 2024-02-03 DIAGNOSIS — Z833 Family history of diabetes mellitus: Secondary | ICD-10-CM

## 2024-02-03 DIAGNOSIS — R6520 Severe sepsis without septic shock: Secondary | ICD-10-CM | POA: Diagnosis not present

## 2024-02-03 DIAGNOSIS — Z8249 Family history of ischemic heart disease and other diseases of the circulatory system: Secondary | ICD-10-CM

## 2024-02-03 DIAGNOSIS — E785 Hyperlipidemia, unspecified: Secondary | ICD-10-CM | POA: Diagnosis present

## 2024-02-03 DIAGNOSIS — R509 Fever, unspecified: Secondary | ICD-10-CM | POA: Diagnosis not present

## 2024-02-03 DIAGNOSIS — Z7984 Long term (current) use of oral hypoglycemic drugs: Secondary | ICD-10-CM | POA: Diagnosis not present

## 2024-02-03 DIAGNOSIS — I2510 Atherosclerotic heart disease of native coronary artery without angina pectoris: Secondary | ICD-10-CM | POA: Diagnosis not present

## 2024-02-03 DIAGNOSIS — Z23 Encounter for immunization: Secondary | ICD-10-CM | POA: Diagnosis not present

## 2024-02-03 DIAGNOSIS — R739 Hyperglycemia, unspecified: Secondary | ICD-10-CM | POA: Diagnosis not present

## 2024-02-03 DIAGNOSIS — Z743 Need for continuous supervision: Secondary | ICD-10-CM | POA: Diagnosis not present

## 2024-02-03 DIAGNOSIS — I340 Nonrheumatic mitral (valve) insufficiency: Secondary | ICD-10-CM | POA: Diagnosis not present

## 2024-02-03 DIAGNOSIS — I10 Essential (primary) hypertension: Secondary | ICD-10-CM | POA: Diagnosis not present

## 2024-02-03 DIAGNOSIS — E872 Acidosis, unspecified: Secondary | ICD-10-CM | POA: Diagnosis present

## 2024-02-03 DIAGNOSIS — Z87442 Personal history of urinary calculi: Secondary | ICD-10-CM

## 2024-02-03 DIAGNOSIS — R410 Disorientation, unspecified: Secondary | ICD-10-CM | POA: Diagnosis not present

## 2024-02-03 DIAGNOSIS — A4159 Other Gram-negative sepsis: Principal | ICD-10-CM | POA: Diagnosis present

## 2024-02-03 DIAGNOSIS — E869 Volume depletion, unspecified: Secondary | ICD-10-CM | POA: Diagnosis present

## 2024-02-03 DIAGNOSIS — K760 Fatty (change of) liver, not elsewhere classified: Secondary | ICD-10-CM | POA: Diagnosis present

## 2024-02-03 DIAGNOSIS — Z79899 Other long term (current) drug therapy: Secondary | ICD-10-CM

## 2024-02-03 DIAGNOSIS — K838 Other specified diseases of biliary tract: Secondary | ICD-10-CM | POA: Diagnosis not present

## 2024-02-03 DIAGNOSIS — R109 Unspecified abdominal pain: Secondary | ICD-10-CM | POA: Diagnosis not present

## 2024-02-03 DIAGNOSIS — F32A Depression, unspecified: Secondary | ICD-10-CM | POA: Diagnosis present

## 2024-02-03 DIAGNOSIS — N12 Tubulo-interstitial nephritis, not specified as acute or chronic: Secondary | ICD-10-CM | POA: Diagnosis not present

## 2024-02-03 DIAGNOSIS — I4510 Unspecified right bundle-branch block: Secondary | ICD-10-CM | POA: Diagnosis present

## 2024-02-03 DIAGNOSIS — Z7982 Long term (current) use of aspirin: Secondary | ICD-10-CM

## 2024-02-03 DIAGNOSIS — G8929 Other chronic pain: Secondary | ICD-10-CM | POA: Diagnosis present

## 2024-02-03 DIAGNOSIS — Z888 Allergy status to other drugs, medicaments and biological substances status: Secondary | ICD-10-CM

## 2024-02-03 DIAGNOSIS — I959 Hypotension, unspecified: Secondary | ICD-10-CM | POA: Diagnosis not present

## 2024-02-03 DIAGNOSIS — E871 Hypo-osmolality and hyponatremia: Secondary | ICD-10-CM | POA: Diagnosis present

## 2024-02-03 DIAGNOSIS — Z809 Family history of malignant neoplasm, unspecified: Secondary | ICD-10-CM

## 2024-02-03 DIAGNOSIS — Z8673 Personal history of transient ischemic attack (TIA), and cerebral infarction without residual deficits: Secondary | ICD-10-CM | POA: Diagnosis not present

## 2024-02-03 DIAGNOSIS — N132 Hydronephrosis with renal and ureteral calculous obstruction: Secondary | ICD-10-CM | POA: Diagnosis not present

## 2024-02-03 DIAGNOSIS — N201 Calculus of ureter: Secondary | ICD-10-CM | POA: Diagnosis not present

## 2024-02-03 DIAGNOSIS — R6889 Other general symptoms and signs: Secondary | ICD-10-CM | POA: Diagnosis not present

## 2024-02-03 DIAGNOSIS — Z0389 Encounter for observation for other suspected diseases and conditions ruled out: Secondary | ICD-10-CM | POA: Diagnosis not present

## 2024-02-03 DIAGNOSIS — Z880 Allergy status to penicillin: Secondary | ICD-10-CM | POA: Diagnosis not present

## 2024-02-03 DIAGNOSIS — B961 Klebsiella pneumoniae [K. pneumoniae] as the cause of diseases classified elsewhere: Secondary | ICD-10-CM | POA: Diagnosis present

## 2024-02-03 DIAGNOSIS — R000 Tachycardia, unspecified: Secondary | ICD-10-CM | POA: Diagnosis not present

## 2024-02-03 DIAGNOSIS — R0902 Hypoxemia: Secondary | ICD-10-CM | POA: Diagnosis not present

## 2024-02-03 DIAGNOSIS — F419 Anxiety disorder, unspecified: Secondary | ICD-10-CM | POA: Diagnosis present

## 2024-02-03 LAB — CBC
HCT: 39.4 % (ref 36.0–46.0)
Hemoglobin: 12.9 g/dL (ref 12.0–15.0)
MCH: 28.7 pg (ref 26.0–34.0)
MCHC: 32.7 g/dL (ref 30.0–36.0)
MCV: 87.8 fL (ref 80.0–100.0)
Platelets: 161 K/uL (ref 150–400)
RBC: 4.49 MIL/uL (ref 3.87–5.11)
RDW: 14.9 % (ref 11.5–15.5)
WBC: 15.2 K/uL — ABNORMAL HIGH (ref 4.0–10.5)
nRBC: 0 % (ref 0.0–0.2)

## 2024-02-03 LAB — URINALYSIS, W/ REFLEX TO CULTURE (INFECTION SUSPECTED)
Bilirubin Urine: NEGATIVE
Glucose, UA: NEGATIVE mg/dL
Ketones, ur: NEGATIVE mg/dL
Nitrite: NEGATIVE
Protein, ur: 100 mg/dL — AB
Specific Gravity, Urine: 1.012 (ref 1.005–1.030)
pH: 6 (ref 5.0–8.0)

## 2024-02-03 LAB — BASIC METABOLIC PANEL WITH GFR
Anion gap: 14 (ref 5–15)
BUN: 41 mg/dL — ABNORMAL HIGH (ref 8–23)
CO2: 21 mmol/L — ABNORMAL LOW (ref 22–32)
Calcium: 9.2 mg/dL (ref 8.9–10.3)
Chloride: 99 mmol/L (ref 98–111)
Creatinine, Ser: 2.71 mg/dL — ABNORMAL HIGH (ref 0.44–1.00)
GFR, Estimated: 19 mL/min — ABNORMAL LOW (ref >60)
Glucose, Bld: 179 mg/dL — ABNORMAL HIGH (ref 70–99)
Potassium: 4.1 mmol/L (ref 3.5–5.1)
Sodium: 134 mmol/L — ABNORMAL LOW (ref 135–145)

## 2024-02-03 LAB — LACTIC ACID, PLASMA: Lactic Acid, Venous: 1.9 mmol/L (ref 0.5–1.9)

## 2024-02-03 LAB — PROTIME-INR
INR: 1.1 (ref 0.8–1.2)
Prothrombin Time: 14.4 s (ref 11.4–15.2)

## 2024-02-03 SURGERY — CYSTOSCOPY, WITH RETROGRADE PYELOGRAM AND URETERAL STENT INSERTION
Anesthesia: General | Site: Ureter | Laterality: Left

## 2024-02-03 MED ORDER — SUCCINYLCHOLINE CHLORIDE 200 MG/10ML IV SOSY
PREFILLED_SYRINGE | INTRAVENOUS | Status: DC | PRN
  Administered 2024-02-03: 140 mg via INTRAVENOUS

## 2024-02-03 MED ORDER — LACTATED RINGERS IV BOLUS (SEPSIS)
1000.0000 mL | Freq: Once | INTRAVENOUS | Status: AC
  Administered 2024-02-03: 1000 mL via INTRAVENOUS

## 2024-02-03 MED ORDER — ACETAMINOPHEN 10 MG/ML IV SOLN: 1000.0000 mg | Freq: Once | INTRAVENOUS | Status: DC | PRN

## 2024-02-03 MED ORDER — LACTATED RINGERS IV BOLUS (SEPSIS)
1000.0000 mL | Freq: Once | INTRAVENOUS | Status: DC
  Administered 2024-02-03: 1000 mL via INTRAVENOUS

## 2024-02-03 MED ORDER — LACTATED RINGERS IV BOLUS (SEPSIS)
250.0000 mL | Freq: Once | INTRAVENOUS | Status: AC
  Administered 2024-02-03: 250 mL via INTRAVENOUS

## 2024-02-03 MED ORDER — OXYCODONE HCL 5 MG/5ML PO SOLN: 5.0000 mg | Freq: Once | ORAL | Status: DC | PRN

## 2024-02-03 MED ORDER — FENTANYL CITRATE (PF) 100 MCG/2ML IJ SOLN: 25.0000 ug | INTRAMUSCULAR | Status: DC | PRN

## 2024-02-03 MED ORDER — ALBUTEROL SULFATE (2.5 MG/3ML) 0.083% IN NEBU
INHALATION_SOLUTION | RESPIRATORY_TRACT | Status: AC
  Administered 2024-02-03: 2.5 mg
  Filled 2024-02-03: qty 3

## 2024-02-03 MED ORDER — ACETAMINOPHEN 10 MG/ML IV SOLN
INTRAVENOUS | Status: AC
  Filled 2024-02-03: qty 100

## 2024-02-03 MED ORDER — ONDANSETRON HCL 4 MG/2ML IJ SOLN
INTRAMUSCULAR | Status: DC | PRN
  Administered 2024-02-03: 4 mg via INTRAVENOUS

## 2024-02-03 MED ORDER — FENTANYL CITRATE (PF) 250 MCG/5ML IJ SOLN
INTRAMUSCULAR | Status: DC | PRN
  Administered 2024-02-03: 100 ug via INTRAVENOUS

## 2024-02-03 MED ORDER — FENTANYL CITRATE (PF) 250 MCG/5ML IJ SOLN
INTRAMUSCULAR | Status: AC
  Filled 2024-02-03: qty 5

## 2024-02-03 MED ORDER — DEXAMETHASONE SODIUM PHOSPHATE 10 MG/ML IJ SOLN
INTRAMUSCULAR | Status: DC | PRN
  Administered 2024-02-03: 5 mg via INTRAVENOUS

## 2024-02-03 MED ORDER — OXYCODONE HCL 5 MG PO TABS: 5.0000 mg | ORAL_TABLET | Freq: Once | ORAL | Status: DC | PRN

## 2024-02-03 MED ORDER — ACETAMINOPHEN 325 MG PO TABS
650.0000 mg | ORAL_TABLET | Freq: Once | ORAL | Status: DC
  Filled 2024-02-03: qty 2

## 2024-02-03 MED ORDER — MORPHINE SULFATE (PF) 4 MG/ML IV SOLN
4.0000 mg | Freq: Once | INTRAVENOUS | Status: AC
  Administered 2024-02-03: 4 mg via INTRAVENOUS
  Filled 2024-02-03: qty 1

## 2024-02-03 MED ORDER — PROPOFOL 10 MG/ML IV BOLUS
INTRAVENOUS | Status: AC
Start: 2024-02-03 — End: 2024-02-03
  Filled 2024-02-03: qty 20

## 2024-02-03 MED ORDER — LACTATED RINGERS IV SOLN: INTRAVENOUS | Status: DC

## 2024-02-03 MED ORDER — ACETAMINOPHEN 10 MG/ML IV SOLN
INTRAVENOUS | Status: DC | PRN
  Administered 2024-02-03: 1000 mg via INTRAVENOUS

## 2024-02-03 MED ORDER — IPRATROPIUM-ALBUTEROL 0.5-2.5 (3) MG/3ML IN SOLN
3.0000 mL | Freq: Once | RESPIRATORY_TRACT | Status: AC
  Administered 2024-02-03: 3 mL via RESPIRATORY_TRACT
  Filled 2024-02-03: qty 3

## 2024-02-03 MED ORDER — PROPOFOL 10 MG/ML IV BOLUS
INTRAVENOUS | Status: DC | PRN
  Administered 2024-02-03: 70 mg via INTRAVENOUS
  Administered 2024-02-03: 30 mg via INTRAVENOUS

## 2024-02-03 MED ORDER — SODIUM CHLORIDE 0.9 % IV SOLN
2.0000 g | Freq: Once | INTRAVENOUS | Status: AC
  Administered 2024-02-03: 2 g via INTRAVENOUS
  Filled 2024-02-03: qty 20

## 2024-02-03 MED ORDER — LACTATED RINGERS IV SOLN: INTRAVENOUS | Status: DC | PRN

## 2024-02-03 SURGICAL SUPPLY — 21 items
BAG DRAIN URO-CYSTO SKYTR STRL (DRAIN) ×2 IMPLANT
BAG URINE DRAIN 2000ML AR STRL (UROLOGICAL SUPPLIES) ×2 IMPLANT
CATH FOLEY 2WAY SLVR 5CC 16FR (CATHETERS) IMPLANT
CATH URETL OPEN END 6FR 70 (CATHETERS) ×2 IMPLANT
GLOVE BIO SURGEON STRL SZ7.5 (GLOVE) ×2 IMPLANT
GOWN STRL REUS W/ TWL LRG LVL3 (GOWN DISPOSABLE) ×2 IMPLANT
GOWN STRL REUS W/ TWL XL LVL3 (GOWN DISPOSABLE) ×2 IMPLANT
GUIDEWIRE ANG ZIPWIRE 038X150 (WIRE) IMPLANT
GUIDEWIRE STR DUAL SENSOR (WIRE) ×2 IMPLANT
GUIDEWIRE STRT TIP .038X150X3 (WIRE) IMPLANT
KIT TURNOVER KIT B (KITS) ×2 IMPLANT
MANIFOLD NEPTUNE II (INSTRUMENTS) ×2 IMPLANT
NS IRRIG 1000ML POUR BTL (IV SOLUTION) ×2 IMPLANT
PACK CYSTO (CUSTOM PROCEDURE TRAY) ×2 IMPLANT
SOLUTION PREP PVP 2OZ (MISCELLANEOUS) IMPLANT
STENT URET 6FRX24 CONTOUR (STENTS) IMPLANT
STENT URET 6FRX26 CONTOUR (STENTS) IMPLANT
SYPHON OMNI JUG (MISCELLANEOUS) ×2 IMPLANT
TOWEL GREEN STERILE FF (TOWEL DISPOSABLE) ×2 IMPLANT
TUBE CONNECTING 12X1/4 (SUCTIONS) IMPLANT
WATER STERILE IRR 3000ML UROMA (IV SOLUTION) ×2 IMPLANT

## 2024-02-03 NOTE — ED Triage Notes (Signed)
 REMs gave 1000mg  of tylenol  and 100 ml of NS.
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
## 2024-02-03 NOTE — ED Notes (Signed)
 ED Provider at bedside.

## 2024-02-03 NOTE — ED Notes (Signed)
 Pt stated she could not pee at this time when asked. She said she would try later.

## 2024-02-03 NOTE — ED Notes (Addendum)
 Pt in room shaking and uncomfortable. Pt denies feeling cold. Pt appears to have audibly wet lung sounds. Pt states she has a hx of asthma and does breathing tx at home. MD made aware. Fluids stopped at this time.

## 2024-02-03 NOTE — Anesthesia Preprocedure Evaluation (Signed)
 Anesthesia Evaluation  Patient identified by MRN, date of birth, ID band Patient confused    Reviewed: Allergy & Precautions, NPO status , Patient's Chart, lab work & pertinent test results  Airway Mallampati: II  TM Distance: >3 FB     Dental  (+) Teeth Intact, Dental Advisory Given   Pulmonary shortness of breath, Patient abstained from smoking., former smoker    + decreased breath sounds      Cardiovascular hypertension, + CAD  + dysrhythmias + Valvular Problems/Murmurs  Rhythm:Regular Rate:Tachycardia  1. Left ventricular ejection fraction, by estimation, is 60 to 65%. The  left ventricle has normal function. The left ventricle has no regional  wall motion abnormalities. Left ventricular diastolic parameters were  normal.   2. Right ventricular systolic function is normal. The right ventricular  size is normal. There is normal pulmonary artery systolic pressure.   3. The mitral valve is normal in structure. Trivial mitral valve  regurgitation. No evidence of mitral stenosis.   4. The aortic valve has an indeterminant number of cusps. Aortic valve  regurgitation is not visualized. No aortic stenosis is present.   5. The inferior vena cava is normal in size with greater than 50%  respiratory variability, suggesting right atrial pressure of 3 mmHg.      Neuro/Psych  Headaches PSYCHIATRIC DISORDERS Anxiety Depression    CVA, No Residual Symptoms    GI/Hepatic   Endo/Other  diabetesHypothyroidism    Renal/GU ARFRenal diseaseLab Results      Component                Value               Date                      NA                       134 (L)             02/03/2024                K                        4.1                 02/03/2024                CO2                      21 (L)              02/03/2024                GLUCOSE                  179 (H)             02/03/2024                BUN                      41 (H)               02/03/2024                CREATININE               2.71 (H)  02/03/2024                CALCIUM                   9.2                 02/03/2024                GFRNONAA                 19 (L)              02/03/2024                Musculoskeletal   Abdominal   Peds  Hematology Lab Results      Component                Value               Date                      WBC                      15.2 (H)            02/03/2024                HGB                      12.9                02/03/2024                HCT                      39.4                02/03/2024                MCV                      87.8                02/03/2024                PLT                      161                 02/03/2024              Anesthesia Other Findings ASD repair  Reproductive/Obstetrics                              Anesthesia Physical Anesthesia Plan  ASA: 5 and emergent  Anesthesia Plan: General   Post-op Pain Management: Ofirmev  IV (intra-op)*   Induction: Intravenous, Rapid sequence and Cricoid pressure planned  PONV Risk Score and Plan: 3 and Dexamethasone  and Ondansetron   Airway Management Planned: Oral ETT  Additional Equipment: Arterial line, CVP and Ultrasound Guidance Line Placement  Intra-op Plan:   Post-operative Plan: Possible Post-op intubation/ventilation  Informed Consent:   Plan Discussed with: CRNA  Anesthesia Plan Comments:          Anesthesia Quick Evaluation

## 2024-02-03 NOTE — ED Triage Notes (Signed)
 Pt arrived [facility] from home with c/o urinary frequency and pain during urination x 3 days. Pt also c/o left side abd pain since yesterday.

## 2024-02-03 NOTE — Progress Notes (Signed)
 CODE SEPSIS - PHARMACY COMMUNICATION  **Broad Spectrum Antibiotics should be administered within 1 hour of Sepsis diagnosis**  Time Code Sepsis Called/Page Received: 1806  Antibiotics Ordered: ceftriaxone   Time of 1st antibiotic administration: 1817  Additional action taken by pharmacy: None  If necessary, Name of Provider/Nurse Contacted: None    Damien Napoleon ,PharmD Clinical Pharmacist  02/03/2024  6:09 PM

## 2024-02-03 NOTE — Sepsis Progress Note (Signed)
 Code Sepsis protocol being monitored by eLink.

## 2024-02-03 NOTE — ED Notes (Signed)
 EDP at bedside during triage

## 2024-02-03 NOTE — ED Provider Notes (Signed)
 [place] EMERGENCY DEPARTMENT AT [facility] Provider Note   CSN: [id number] Arrival date & time: 02/03/24  1732     Patient presents with: Urinary Frequency   Anita [name] is a 63 y.o. female.  She is brought in by ambulance for general weakness and feeling bad.  She said it has been going on for a few days.  Nonproductive cough.  Nausea and vomiting.  Lower abdominal pain.  Some diarrhea and also dysuria.  Fever.  Feeling generally weak.  Unable to ambulate due to weakness.  Has not seen her doctor or tried anything for this.   The history is provided by the patient and the EMS personnel.  Abdominal Pain Pain location:  Suprapubic Pain quality: aching   Pain severity:  Moderate Onset quality:  Gradual Duration:  3 days Timing:  Constant Progression:  Unchanged Chronicity:  New Context: not trauma   Relieved by:  None tried Worsened by:  Nothing Ineffective treatments:  None tried Associated symptoms: cough, diarrhea, dysuria, fatigue, fever, nausea, shortness of breath and vomiting   Associated symptoms: no chest pain, no hematemesis, no hematochezia and no hematuria        Prior to Admission medications   Medication Sig Start Date End Date Taking? Authorizing Provider  acetaminophen  (TYLENOL ) 325 MG tablet Take 2 tablets (650 mg total) by mouth every 6 (six) hours as needed for mild pain (pain score 1-3) (or Fever >/= 101). 03/20/23   Pearlean Manus, MD  ALPRAZolam  (XANAX ) 0.5 MG tablet Take 0.5 tablets (0.25 mg total) by mouth 2 (two) times daily as needed for anxiety. 03/20/23   Pearlean Manus, MD  amLODipine  (NORVASC ) 10 MG tablet Take 1 tablet (10 mg total) by mouth daily. 02/16/23   Johnson, Clanford L, MD  aspirin  EC 81 MG tablet Take 81 mg by mouth daily. Swallow whole.    [provider]  atorvastatin  (LIPITOR) 20 MG tablet Take 1 tablet (20 mg total) by mouth daily. 01/11/23   Jerilynn Lamarr HERO, NP  busPIRone  (BUSPAR ) 10 MG tablet Take 10  mg by mouth 2 (two) times daily.    [provider]  gabapentin  (NEURONTIN ) 600 MG tablet Take 1 tablet (600 mg total) by mouth at bedtime. 03/20/23   Pearlean Manus, MD  glipiZIDE  (GLUCOTROL ) 5 MG tablet Take 1 tablet (5 mg total) by mouth with breakfast, with lunch, and with evening meal. 02/15/23   Johnson, Clanford L, MD  isosorbide  mononitrate (IMDUR ) 60 MG 24 hr tablet Take 1 tablet (60 mg total) by mouth daily. 02/15/23 02/15/24  Johnson, Clanford L, MD  metFORMIN  (GLUCOPHAGE ) 500 MG tablet Take 1,000 mg by mouth 2 (two) times daily with a meal.    [provider]  metoprolol  tartrate (LOPRESSOR ) 100 MG tablet Take 1 tablet (100 mg total) by mouth 2 (two) times daily. 01/11/23   Jerilynn Lamarr HERO, NP  oxyCODONE  (ROXICODONE ) 15 MG immediate release tablet Take 1 tablet (15 mg total) by mouth every 8 (eight) hours as needed for pain. Patient taking differently: Take 15 mg by mouth in the morning, at noon, in the evening, and at bedtime. 02/15/23   Johnson, Clanford L, MD  promethazine  (PHENERGAN ) 25 MG tablet Take 25 mg by mouth 4 (four) times daily. 02/17/23   [provider]  tamsulosin  (FLOMAX ) 0.4 MG CAPS capsule Take 1 capsule (0.4 mg total) by mouth daily after supper. 03/20/23   Pearlean Manus, MD    Allergies: Altace  [place] ], Imitrex [sumatriptan], Lyrica [  pregabalin], and Penicillins    Review of Systems  Constitutional:  Positive for fatigue and fever.  Eyes:  Negative for visual disturbance.  Respiratory:  Positive for cough and shortness of breath.   Cardiovascular:  Negative for chest pain.  Gastrointestinal:  Positive for abdominal pain, diarrhea, nausea and vomiting. Negative for hematemesis and hematochezia.  Genitourinary:  Positive for dysuria. Negative for hematuria.    Updated Vital Signs BP (!) 169/73   Pulse (!) 106   Temp (!) 100.8 F (38.2 C) (Oral)   Resp 16   Ht 5' 5 (1.651 m)   Wt 68 kg   SpO2 94%   BMI 24.96 kg/m    Physical Exam Vitals and nursing note reviewed.  Constitutional:      General: She is not in acute distress.    Appearance: Normal appearance. She is well-developed. She is ill-appearing.  HENT:     Head: Normocephalic and atraumatic.  Eyes:     Conjunctiva/sclera: Conjunctivae normal.  Cardiovascular:     Rate and Rhythm: Regular rhythm. Tachycardia present.     Heart sounds: No murmur heard. Pulmonary:     Effort: Pulmonary effort is normal. No respiratory distress.     Breath sounds: Normal breath sounds. No stridor. No wheezing.  Abdominal:     Palpations: Abdomen is soft.     Tenderness: There is abdominal tenderness (lower abd). There is no guarding or rebound.  Musculoskeletal:        General: No tenderness or deformity. Normal range of motion.     Cervical back: Neck supple.  Skin:    General: Skin is warm and dry.  Neurological:     General: No focal deficit present.     Mental Status: She is alert.     GCS: GCS eye subscore is 4. GCS verbal subscore is 5. GCS motor subscore is 6.     Motor: No weakness.     (all labs ordered are listed, but only abnormal results are displayed) Labs Reviewed  BASIC METABOLIC PANEL WITH GFR - Abnormal; Notable for the following components:      Result Value   Sodium 134 (*)    CO2 21 (*)    Glucose, Bld 179 (*)    BUN 41 (*)    Creatinine, Ser 2.71 (*)    GFR, Estimated 19 (*)    All other components within normal limits  CBC - Abnormal; Notable for the following components:   WBC 15.2 (*)    All other components within normal limits  URINALYSIS, W/ REFLEX TO CULTURE (INFECTION SUSPECTED) - Abnormal; Notable for the following components:   Hgb urine dipstick MODERATE (*)    Protein, ur 100 (*)    Leukocytes,Ua SMALL (*)    Bacteria, UA MANY (*)    All other components within normal limits  GLUCOSE, CAPILLARY - Abnormal; Notable for the following components:   Glucose-Capillary 138 (*)    All other components within  normal limits  CBC WITH DIFFERENTIAL/PLATELET - Abnormal; Notable for the following components:   WBC 20.7 (*)    RBC 3.65 (*)    Hemoglobin 10.5 (*)    HCT 32.3 (*)    Platelets 120 (*)    Neutro Abs 18.8 (*)    Lymphs Abs 0.3 (*)    Monocytes Absolute 1.4 (*)    Abs Immature Granulocytes 0.24 (*)    All other components within normal limits  BASIC METABOLIC PANEL WITH GFR - Abnormal; Notable for  the following components:   CO2 19 (*)    Glucose, Bld 257 (*)    BUN 36 (*)    Creatinine, Ser 2.69 (*)    Calcium  8.3 (*)    GFR, Estimated 19 (*)    Anion gap 16 (*)    All other components within normal limits  GLUCOSE, CAPILLARY - Abnormal; Notable for the following components:   Glucose-Capillary 194 (*)    All other components within normal limits  HEMOGLOBIN A1C - Abnormal; Notable for the following components:   Hgb A1c MFr Bld 6.4 (*)    All other components within normal limits  GLUCOSE, CAPILLARY - Abnormal; Notable for the following components:   Glucose-Capillary 284 (*)    All other components within normal limits  CULTURE, BLOOD (ROUTINE X 2)  CULTURE, BLOOD (ROUTINE X 2)  URINE CULTURE  LACTIC ACID, PLASMA  PROTIME-INR    EKG: EKG Interpretation Date/Time:  Friday February 03 2024 18:39:28 EDT Ventricular Rate:  95 PR Interval:    QRS Duration:  98 QT Interval:  368 QTC Calculation: 463 R Axis:   78  Text Interpretation: sinus rhythm with artifact RSR' in V1 or V2, right VCD or RVH increased rate from prior 1/25 Confirmed by Towana Sharper [phone]) on 02/03/2024 6:49:47 PM  Radiology: CT ABDOMEN PELVIS WO CONTRAST Result Date: 02/03/2024 EXAM: CT ABDOMEN AND PELVIS WITHOUT CONTRAST 02/03/2024 07:54:05 PM TECHNIQUE: CT of the abdomen and pelvis was performed without the administration of intravenous contrast. Multiplanar reformatted images are provided for review. Automated exposure control, iterative reconstruction, and/or weight-based adjustment of the  mA/kV was utilized to reduce the radiation dose to as low as reasonably achievable. COMPARISON: Comparison with CT dated 05/03/22. CLINICAL HISTORY: Abdominal pain, acute, nonlocalized. Urinary frequency, gen abd pain. ; c/o urinary frequency and pain during urination x 3 days. Pt also c/o left side abd pain since yesterday. FINDINGS: LOWER CHEST: No acute abnormality. LIVER: The liver is unremarkable. GALLBLADDER AND BILE DUCTS: No evidence of cholecystitis. Layering sludge in the gallbladder. SPLEEN: No acute abnormality. PANCREAS: No acute abnormality. ADRENAL GLANDS: No acute abnormality. KIDNEYS, URETERS AND BLADDER: Asymmetric left greater than right perinephric stranding. Mild left hydronephrosis upstream from a 9 mm stone in the proximal left ureter. Gas within the bladder. GI AND BOWEL: Stomach demonstrates no acute abnormality. There is no bowel obstruction. PERITONEUM AND RETROPERITONEUM: No free air. VASCULATURE: Aorta is normal in caliber. LYMPH NODES: No lymphadenopathy. REPRODUCTIVE ORGANS: No acute abnormality. BONES AND SOFT TISSUES: No acute osseous abnormality. No focal soft tissue abnormality. IMPRESSION: 1. Mild left hydronephrosis upstream from a 9 mm stone in the proximal left ureter. 2. Asymmetric left greater than right perinephric stranding maybe due to obstruction or pyelonephritis . 3. Gas within the bladder correlate for recent instrumentation or cystitis . Electronically signed by: Norman Gatlin MD 02/03/2024 08:03 PM EDT RP Workstation: [id number]   DG Chest Port 1 View if patient is in a treatment room. Result Date: 02/03/2024 CLINICAL DATA:  Suspected sepsis EXAM: PORTABLE CHEST 1 VIEW COMPARISON:  03/18/2023 FINDINGS: Prior median sternotomy. Heart is borderline in size. No confluent airspace opacities or effusions. No edema. No acute bony abnormality. IMPRESSION: Borderline heart size.  No active disease. Electronically Signed   By: Franky Crease M.D.   On: 02/03/2024 18:10      .Critical Care  Performed by: Towana Sharper BROCKS, MD Authorized by: Towana Sharper BROCKS, MD   Critical care provider statement:    Critical care time (  minutes):  45   Critical care time was exclusive of:  Separately billable procedures and treating other patients   Critical care was necessary to treat or prevent imminent or life-threatening deterioration of the following conditions:  Sepsis   Critical care was time spent personally by me on the following activities:  Development of treatment plan with patient or surrogate, discussions with consultants, evaluation of patient's response to treatment, examination of patient, obtaining history from patient or surrogate, ordering and performing treatments and interventions, ordering and review of laboratory studies, ordering and review of radiographic studies, pulse oximetry, re-evaluation of patient's condition and review of old charts   I assumed direction of critical care for this patient from another provider in my specialty: no      Medications Ordered in the ED  cefTRIAXone  (ROCEPHIN ) 1 g in sodium chloride  0.9 % 100 mL IVPB (has no administration in time range)  influenza vac split trivalent PF (FLUZONE ) injection 0.5 mL (has no administration in time range)  insulin  aspart (novoLOG ) injection 0-9 Units (5 Units Subcutaneous Given 02/04/24 0811)  insulin  aspart (novoLOG ) injection 0-5 Units (has no administration in time range)  ALPRAZolam  (XANAX ) tablet 0.25 mg (has no administration in time range)  aspirin  EC tablet 81 mg (has no administration in time range)  atorvastatin  (LIPITOR) tablet 20 mg (has no administration in time range)  busPIRone  (BUSPAR ) tablet 10 mg (has no administration in time range)  oxyCODONE  (Oxy IR/ROXICODONE ) immediate release tablet 15 mg (has no administration in time range)  promethazine  (PHENERGAN ) tablet 25 mg (has no administration in time range)  tiZANidine  (ZANAFLEX ) tablet 4 mg (has no administration in  time range)  isosorbide  mononitrate (IMDUR ) 24 hr tablet 30 mg (has no administration in time range)  0.9 %  sodium chloride  infusion (has no administration in time range)  heparin  injection 5,000 Units (has no administration in time range)  gabapentin  (NEURONTIN ) capsule 200 mg (has no administration in time range)  lactated ringers  bolus 1,000 mL (0 mLs Intravenous Stopped 02/03/24 1921)    And  lactated ringers  bolus 1,000 mL (0 mLs Intravenous Stopped 02/03/24 1922)    And  lactated ringers  bolus 250 mL (0 mLs Intravenous Stopped 02/03/24 1922)  cefTRIAXone  (ROCEPHIN ) 2 g in sodium chloride  0.9 % 100 mL IVPB (0 g Intravenous Stopped 02/03/24 1900)  morphine  (PF) 4 MG/ML injection 4 mg (4 mg Intravenous Given 02/03/24 1945)  ipratropium-albuterol  (DUONEB) 0.5-2.5 (3) MG/3ML nebulizer solution 3 mL (3 mLs Nebulization Given 02/03/24 2027)  albuterol  (PROVENTIL ) (2.5 MG/3ML) 0.083% nebulizer solution (2.5 mg  Given 02/03/24 2027)    Clinical Course as of 02/04/24 0840  Fri Feb 03, 2024  1816 X-ray interpreted by me as no acute infiltrate.  Awaiting radiology reading. [MB]  2033 Discussed with Dr. Carolee urology.  He reviewed her CT imaging and lab work.  He asked that [facility] ED where he can take a look at her to decide whether she needs to go to the operating room tonight.  I updated the patient regarding this and she is comfortable plan for transfer. [MB]    Clinical Course User Index [MB] Towana Ozell BROCKS, MD                                 Medical Decision Making Amount and/or Complexity of Data Reviewed Labs: ordered. Radiology: ordered.  Risk Prescription drug management.   This patient  complains of abdominal pain nausea vomiting urinary symptoms fever; this involves an extensive number of treatment Options and is a complaint that carries with it a high risk of complications and morbidity. The differential includes sepsis, pyelonephritis, renal colic, perforation, renal  failure, dehydration  I ordered, reviewed and interpreted labs, which included CBC with elevated white count, chemistries with new AKI, urinalysis concerning for infection, lactate normal, blood culture sent I ordered medication IV fluids IV antibiotics and reviewed PMP when indicated. I ordered imaging studies which included chest x-ray and CT abdomen and pelvis without and I independently    visualized and interpreted imaging which showed proximal left ureteral stone with hydronephrosis, question pyelonephritis Additional history obtained from EMS Previous records obtained and reviewed in epic including prior ED and discharge summaries I consulted urology Dr. Carolee and discussed lab and imaging findings and discussed disposition.  Cardiac monitoring reviewed, sinus tachycardia Social determinants considered, Food insecurity Critical Interventions: Initiation of sepsis protocol including goal-directed fluids and antibiotics, involvement of consultants  After the interventions stated above, I reevaluated the patient and found patient still to be ill-appearing Admission and further testing considered, she will be transferred to [facility] for evaluation by urology and possible stenting.  She is in agreement with plan for admission.      Final diagnoses:  Acute sepsis (HCC)  Acute pyelonephritis  Ureteral stone with hydronephrosis    ED Discharge Orders     None          Towana Ozell BROCKS, MD 02/04/24 [phone]

## 2024-02-03 NOTE — Anesthesia Procedure Notes (Signed)
 Procedure Name: Intubation Date/Time: 02/03/2024 11:20 PM  Performed by: Annalaya Wile C., CRNAPre-anesthesia Checklist: Patient identified, Emergency Drugs available, Suction available, Patient being monitored and Timeout performed Patient Re-evaluated:Patient Re-evaluated prior to induction Oxygen Delivery Method: Circle system utilized Preoxygenation: Pre-oxygenation with 100% oxygen Induction Type: IV induction, Rapid sequence and Cricoid Pressure applied Laryngoscope Size: Mac and 3 Grade View: Grade II Tube type: Oral Tube size: 7.0 mm Number of attempts: 1 Airway Equipment and Method: Stylet Placement Confirmation: ETT inserted through vocal cords under direct vision, positive ETCO2 and breath sounds checked- equal and bilateral Secured at: 22 cm Tube secured with: Tape Dental Injury: Teeth and Oropharynx as per pre-operative assessment

## 2024-02-03 NOTE — Op Note (Signed)
 Operative Note  Preoperative diagnosis:  1.  Left ureteral calculus with sepsis secondary to UTI  Post operative diagnosis: 1.  Left ureteral calculus with sepsis secondary to UTI  Procedure(s): 1.  Cystoscopy with left retrograde pyelogram and left ureteral stent placement  Surgeon: Sherwood Edison, MD  Assistants: None  Anesthesia: General  Complications: None immediate  EBL: Minimal  Specimens: 1.  None  Drains/Catheters: 1.  6 X 24 double-J ureteral stent 2.  Foley catheter  Intraoperative findings: 1.  Normal urethra and bladder 2.  Left retrograde pyelogram revealed a filling defect at the level of the stone with upstream hydroureteronephrosis  Indication: 63 year old female with a left ureteral calculus and sepsis presents for emergent stent placement.  Description of procedure:  The patient was identified and consent was obtained.  The patient was taken to the operating room and placed in the supine position.  The patient was placed under general anesthesia.  Perioperative antibiotics were administered.  The patient was placed in dorsal lithotomy.  Patient was prepped and draped in a standard sterile fashion and a timeout was performed.  A 21 French rigid cystoscope was advanced into the urethra and into the bladder.  The left distal most portion of the ureter was cannulated with an open-ended ureteral catheter.  Retrograde pyelogram was performed with the findings noted above.  A sensor wire was then advanced up to the kidney under fluoroscopic guidance.  A 6 X 24 double-J ureteral stent was advanced up to the kidney under fluoroscopic guidance.  The wire was withdrawn and fluoroscopy confirmed good proximal placement and direct visualization confirmed a good coil within the bladder.  The bladder was drained and the scope withdrawn.  Foley catheter was placed.  This concluded the operation.  Patient tolerated procedure well and was stable postoperatively.  Plan: I made a  call out to the hospitalist service to assist with admission.  She will need stepdown status.  She will be continued on fluids and antibiotics.  She will have her Foley catheter removed once she has improved clinically.  Will plan for ureteroscopy in a couple of weeks or so once she has been treated for the infection and fully recovered

## 2024-02-03 NOTE — ED Notes (Signed)
 Started on 3 liters nasal cannula for low saturation 91 and increased BP , HR.
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
## 2024-02-03 NOTE — ED Notes (Signed)
 Patient transported to CT

## 2024-02-04 ENCOUNTER — Encounter (HOSPITAL_COMMUNITY): Payer: Self-pay | Admitting: Internal Medicine

## 2024-02-04 DIAGNOSIS — Z880 Allergy status to penicillin: Secondary | ICD-10-CM | POA: Diagnosis not present

## 2024-02-04 DIAGNOSIS — Z833 Family history of diabetes mellitus: Secondary | ICD-10-CM | POA: Diagnosis not present

## 2024-02-04 DIAGNOSIS — E872 Acidosis, unspecified: Secondary | ICD-10-CM | POA: Diagnosis present

## 2024-02-04 DIAGNOSIS — A4151 Sepsis due to Escherichia coli [E. coli]: Secondary | ICD-10-CM | POA: Diagnosis not present

## 2024-02-04 DIAGNOSIS — Z7982 Long term (current) use of aspirin: Secondary | ICD-10-CM | POA: Diagnosis not present

## 2024-02-04 DIAGNOSIS — Z87891 Personal history of nicotine dependence: Secondary | ICD-10-CM | POA: Diagnosis not present

## 2024-02-04 DIAGNOSIS — E869 Volume depletion, unspecified: Secondary | ICD-10-CM | POA: Diagnosis present

## 2024-02-04 DIAGNOSIS — Z23 Encounter for immunization: Secondary | ICD-10-CM | POA: Diagnosis present

## 2024-02-04 DIAGNOSIS — A4159 Other Gram-negative sepsis: Secondary | ICD-10-CM | POA: Diagnosis not present

## 2024-02-04 DIAGNOSIS — I10 Essential (primary) hypertension: Secondary | ICD-10-CM | POA: Diagnosis present

## 2024-02-04 DIAGNOSIS — A419 Sepsis, unspecified organism: Secondary | ICD-10-CM

## 2024-02-04 DIAGNOSIS — R6520 Severe sepsis without septic shock: Secondary | ICD-10-CM | POA: Diagnosis present

## 2024-02-04 DIAGNOSIS — Z7984 Long term (current) use of oral hypoglycemic drugs: Secondary | ICD-10-CM | POA: Diagnosis not present

## 2024-02-04 DIAGNOSIS — E1142 Type 2 diabetes mellitus with diabetic polyneuropathy: Secondary | ICD-10-CM | POA: Diagnosis present

## 2024-02-04 DIAGNOSIS — Z8673 Personal history of transient ischemic attack (TIA), and cerebral infarction without residual deficits: Secondary | ICD-10-CM | POA: Diagnosis not present

## 2024-02-04 DIAGNOSIS — F32A Depression, unspecified: Secondary | ICD-10-CM | POA: Diagnosis present

## 2024-02-04 DIAGNOSIS — E785 Hyperlipidemia, unspecified: Secondary | ICD-10-CM | POA: Diagnosis present

## 2024-02-04 DIAGNOSIS — N179 Acute kidney failure, unspecified: Secondary | ICD-10-CM

## 2024-02-04 DIAGNOSIS — B961 Klebsiella pneumoniae [K. pneumoniae] as the cause of diseases classified elsewhere: Secondary | ICD-10-CM | POA: Diagnosis present

## 2024-02-04 DIAGNOSIS — E871 Hypo-osmolality and hyponatremia: Secondary | ICD-10-CM | POA: Diagnosis present

## 2024-02-04 DIAGNOSIS — N136 Pyonephrosis: Secondary | ICD-10-CM | POA: Diagnosis present

## 2024-02-04 DIAGNOSIS — F419 Anxiety disorder, unspecified: Secondary | ICD-10-CM | POA: Diagnosis present

## 2024-02-04 DIAGNOSIS — I2510 Atherosclerotic heart disease of native coronary artery without angina pectoris: Secondary | ICD-10-CM | POA: Diagnosis present

## 2024-02-04 DIAGNOSIS — Z8249 Family history of ischemic heart disease and other diseases of the circulatory system: Secondary | ICD-10-CM | POA: Diagnosis not present

## 2024-02-04 DIAGNOSIS — K760 Fatty (change of) liver, not elsewhere classified: Secondary | ICD-10-CM | POA: Diagnosis present

## 2024-02-04 DIAGNOSIS — I340 Nonrheumatic mitral (valve) insufficiency: Secondary | ICD-10-CM | POA: Diagnosis present

## 2024-02-04 LAB — BLOOD CULTURE ID PANEL (REFLEXED) - BCID2

## 2024-02-04 LAB — CBC WITH DIFFERENTIAL/PLATELET
Abs Immature Granulocytes: 0.24 K/uL — ABNORMAL HIGH (ref 0.00–0.07)
Basophils Absolute: 0 K/uL (ref 0.0–0.1)
Basophils Relative: 0 %
Eosinophils Absolute: 0 K/uL (ref 0.0–0.5)
Eosinophils Relative: 0 %
HCT: 32.3 % — ABNORMAL LOW (ref 36.0–46.0)
Hemoglobin: 10.5 g/dL — ABNORMAL LOW (ref 12.0–15.0)
Immature Granulocytes: 1 %
Lymphocytes Relative: 1 %
Lymphs Abs: 0.3 K/uL — ABNORMAL LOW (ref 0.7–4.0)
MCH: 28.8 pg (ref 26.0–34.0)
MCHC: 32.5 g/dL (ref 30.0–36.0)
MCV: 88.5 fL (ref 80.0–100.0)
Monocytes Absolute: 1.4 K/uL — ABNORMAL HIGH (ref 0.1–1.0)
Monocytes Relative: 7 %
Neutro Abs: 18.8 K/uL — ABNORMAL HIGH (ref 1.7–7.7)
Neutrophils Relative %: 91 %
Platelets: 120 K/uL — ABNORMAL LOW (ref 150–400)
RBC: 3.65 MIL/uL — ABNORMAL LOW (ref 3.87–5.11)
RDW: 15.1 % (ref 11.5–15.5)
WBC: 20.7 K/uL — ABNORMAL HIGH (ref 4.0–10.5)
nRBC: 0 % (ref 0.0–0.2)

## 2024-02-04 LAB — BASIC METABOLIC PANEL WITH GFR
Anion gap: 16 — ABNORMAL HIGH (ref 5–15)
BUN: 36 mg/dL — ABNORMAL HIGH (ref 8–23)
CO2: 19 mmol/L — ABNORMAL LOW (ref 22–32)
Calcium: 8.3 mg/dL — ABNORMAL LOW (ref 8.9–10.3)
Chloride: 102 mmol/L (ref 98–111)
Creatinine, Ser: 2.69 mg/dL — ABNORMAL HIGH (ref 0.44–1.00)
GFR, Estimated: 19 mL/min — ABNORMAL LOW (ref >60)
Glucose, Bld: 257 mg/dL — ABNORMAL HIGH (ref 70–99)
Potassium: 4.3 mmol/L (ref 3.5–5.1)
Sodium: 137 mmol/L (ref 135–145)

## 2024-02-04 LAB — GLUCOSE, CAPILLARY
Glucose-Capillary: 132 mg/dL — ABNORMAL HIGH (ref 70–99)
Glucose-Capillary: 138 mg/dL — ABNORMAL HIGH (ref 70–99)
Glucose-Capillary: 151 mg/dL — ABNORMAL HIGH (ref 70–99)
Glucose-Capillary: 194 mg/dL — ABNORMAL HIGH (ref 70–99)
Glucose-Capillary: 226 mg/dL — ABNORMAL HIGH (ref 70–99)
Glucose-Capillary: 284 mg/dL — ABNORMAL HIGH (ref 70–99)

## 2024-02-04 LAB — HEMOGLOBIN A1C
Hgb A1c MFr Bld: 6.4 % — ABNORMAL HIGH (ref 4.8–5.6)
Mean Plasma Glucose: 136.98 mg/dL

## 2024-02-04 MED ORDER — IOHEXOL 300 MG/ML  SOLN
INTRAMUSCULAR | Status: DC | PRN
  Administered 2024-02-03: 100 mL via URETHRAL

## 2024-02-04 MED ORDER — OXYCODONE HCL 5 MG PO TABS
15.0000 mg | ORAL_TABLET | ORAL | Status: DC | PRN
  Administered 2024-02-05 – 2024-02-08 (×4): 15 mg via ORAL
  Filled 2024-02-04 (×4): qty 3

## 2024-02-04 MED ORDER — ASPIRIN 81 MG PO TBEC
81.0000 mg | DELAYED_RELEASE_TABLET | Freq: Every day | ORAL | Status: DC
  Administered 2024-02-04 – 2024-02-08 (×5): 81 mg via ORAL
  Filled 2024-02-04 (×5): qty 1

## 2024-02-04 MED ORDER — TIZANIDINE HCL 2 MG PO TABS: 4.0000 mg | ORAL_TABLET | Freq: Three times a day (TID) | ORAL | Status: DC | PRN

## 2024-02-04 MED ORDER — METOPROLOL TARTRATE 50 MG PO TABS: 100.0000 mg | ORAL_TABLET | Freq: Two times a day (BID) | ORAL | Status: DC

## 2024-02-04 MED ORDER — GABAPENTIN 100 MG PO CAPS: 600.0000 mg | ORAL_CAPSULE | Freq: Every day | ORAL | Status: DC

## 2024-02-04 MED ORDER — ACETAMINOPHEN 500 MG PO TABS
1000.0000 mg | ORAL_TABLET | Freq: Once | ORAL | Status: AC
  Administered 2024-02-04: 1000 mg via ORAL
  Filled 2024-02-04: qty 2

## 2024-02-04 MED ORDER — HYDRALAZINE HCL 20 MG/ML IJ SOLN
10.0000 mg | Freq: Four times a day (QID) | INTRAMUSCULAR | Status: DC | PRN
  Administered 2024-02-04 – 2024-02-06 (×4): 10 mg via INTRAVENOUS
  Filled 2024-02-04 (×4): qty 1

## 2024-02-04 MED ORDER — PROMETHAZINE HCL 25 MG PO TABS: 25.0000 mg | ORAL_TABLET | Freq: Four times a day (QID) | ORAL | Status: DC | PRN

## 2024-02-04 MED ORDER — SODIUM CHLORIDE 0.9 % IV SOLN
2.0000 g | INTRAVENOUS | Status: DC
  Administered 2024-02-04 – 2024-02-07 (×4): 2 g via INTRAVENOUS
  Filled 2024-02-04 (×4): qty 20

## 2024-02-04 MED ORDER — ATORVASTATIN CALCIUM 10 MG PO TABS
20.0000 mg | ORAL_TABLET | Freq: Every day | ORAL | Status: DC
  Administered 2024-02-04 – 2024-02-08 (×5): 20 mg via ORAL
  Filled 2024-02-04 (×5): qty 2

## 2024-02-04 MED ORDER — ISOSORBIDE MONONITRATE ER 30 MG PO TB24: 60.0000 mg | ORAL_TABLET | Freq: Every day | ORAL | Status: DC

## 2024-02-04 MED ORDER — BUSPIRONE HCL 5 MG PO TABS
10.0000 mg | ORAL_TABLET | Freq: Two times a day (BID) | ORAL | Status: DC
  Administered 2024-02-04 – 2024-02-05 (×4): 10 mg via ORAL
  Filled 2024-02-04 (×4): qty 2

## 2024-02-04 MED ORDER — CHLORHEXIDINE GLUCONATE CLOTH 2 % EX PADS
6.0000 | MEDICATED_PAD | Freq: Every day | CUTANEOUS | Status: DC
  Administered 2024-02-05: 6 via TOPICAL

## 2024-02-04 MED ORDER — TIZANIDINE HCL 2 MG PO TABS: 8.0000 mg | ORAL_TABLET | Freq: Four times a day (QID) | ORAL | Status: DC | PRN

## 2024-02-04 MED ORDER — ALPRAZOLAM 0.25 MG PO TABS: 0.2500 mg | ORAL_TABLET | Freq: Two times a day (BID) | ORAL | Status: DC | PRN

## 2024-02-04 MED ORDER — AMLODIPINE BESYLATE 10 MG PO TABS: 10.0000 mg | ORAL_TABLET | Freq: Every day | ORAL | Status: DC

## 2024-02-04 MED ORDER — SODIUM CHLORIDE 0.9 % IV SOLN: 1.0000 g | INTRAVENOUS | Status: DC

## 2024-02-04 MED ORDER — INSULIN ASPART 100 UNIT/ML IJ SOLN
0.0000 [IU] | Freq: Three times a day (TID) | INTRAMUSCULAR | Status: DC
  Administered 2024-02-04: 1 [IU] via SUBCUTANEOUS
  Administered 2024-02-04: 5 [IU] via SUBCUTANEOUS
  Administered 2024-02-04: 3 [IU] via SUBCUTANEOUS
  Administered 2024-02-05 – 2024-02-06 (×3): 1 [IU] via SUBCUTANEOUS
  Administered 2024-02-06: 2 [IU] via SUBCUTANEOUS
  Administered 2024-02-06 – 2024-02-07 (×2): 1 [IU] via SUBCUTANEOUS
  Administered 2024-02-08: 2 [IU] via SUBCUTANEOUS

## 2024-02-04 MED ORDER — GABAPENTIN 100 MG PO CAPS
200.0000 mg | ORAL_CAPSULE | Freq: Three times a day (TID) | ORAL | Status: DC
Start: 2024-02-04 — End: 2024-02-06
  Administered 2024-02-04 – 2024-02-06 (×7): 200 mg via ORAL
  Filled 2024-02-04 (×7): qty 2

## 2024-02-04 MED ORDER — HEPARIN SODIUM (PORCINE) 5000 UNIT/ML IJ SOLN
5000.0000 [IU] | Freq: Three times a day (TID) | INTRAMUSCULAR | Status: DC
  Administered 2024-02-04 – 2024-02-08 (×12): 5000 [IU] via SUBCUTANEOUS
  Filled 2024-02-04 (×12): qty 1

## 2024-02-04 MED ORDER — SODIUM CHLORIDE 0.9 % IV SOLN: INTRAVENOUS | Status: DC

## 2024-02-04 MED ORDER — INSULIN ASPART 100 UNIT/ML IJ SOLN
0.0000 [IU] | Freq: Every day | INTRAMUSCULAR | Status: DC
  Administered 2024-02-07: 2 [IU] via SUBCUTANEOUS

## 2024-02-04 MED ORDER — MORPHINE SULFATE (PF) 2 MG/ML IV SOLN
2.0000 mg | INTRAVENOUS | Status: DC | PRN
  Administered 2024-02-04: 2 mg via INTRAVENOUS
  Filled 2024-02-04: qty 1

## 2024-02-04 MED ORDER — ISOSORBIDE MONONITRATE ER 30 MG PO TB24
30.0000 mg | ORAL_TABLET | Freq: Every day | ORAL | Status: DC
  Administered 2024-02-04 – 2024-02-06 (×3): 30 mg via ORAL
  Filled 2024-02-04 (×3): qty 1

## 2024-02-04 MED ORDER — INFLUENZA VIRUS VACC SPLIT PF (FLUZONE) 0.5 ML IM SUSY
0.5000 mL | PREFILLED_SYRINGE | INTRAMUSCULAR | Status: AC
  Administered 2024-02-05: 0.5 mL via INTRAMUSCULAR
  Filled 2024-02-04: qty 0.5

## 2024-02-04 MED ORDER — WATER FOR IRRIGATION, STERILE IR SOLN
Status: DC | PRN
  Administered 2024-02-03: 1000 mL

## 2024-02-04 NOTE — Anesthesia Postprocedure Evaluation (Signed)
 Anesthesia Post Note  Patient: Bernadine L Schwinn  Procedure(s) Performed: Cystoscopy with left retrograde pyelogram and left ureteral stent placement (Left: Ureter)     Patient location during evaluation: Nursing Unit Anesthesia Type: General Level of consciousness: awake and patient cooperative Pain management: pain level controlled Vital Signs Assessment: post-procedure vital signs reviewed and stable Respiratory status: spontaneous breathing, nonlabored ventilation and respiratory function stable Cardiovascular status: blood pressure returned to baseline and stable Postop Assessment: no apparent nausea or vomiting Anesthetic complications: no   No notable events documented.  Last Vitals:  Vitals:   02/04/24 0030 02/04/24 0056  BP: 138/66 (!) 129/51  Pulse: (!) 113 (!) 110  Resp: 19   Temp: 36.9 C 37.5 C  SpO2: 95% 96%    Last Pain:  Vitals:   02/04/24 0056  TempSrc: Oral  PainSc:                  Taiyana Kissler

## 2024-02-04 NOTE — Transfer of Care (Signed)
 Immediate Anesthesia Transfer of Care Note  Patient: Anita [name]  Procedure(s) Performed: CYSTOSCOPY, WITH RETROGRADE PYELOGRAM AND URETERAL STENT INSERTION (Left)  Patient Location: PACU  Anesthesia Type:General  Level of Consciousness: awake, alert , and oriented  Airway & Oxygen Therapy: Patient Spontanous Breathing  Post-op Assessment: Report given to RN and Post -op Vital signs reviewed and stable  Post vital signs: Reviewed and stable  Last Vitals:  Vitals Value Taken Time  BP 154/56 02/04/24 00:00  Temp    Pulse 116 02/04/24 00:03  Resp 19 02/04/24 00:03  SpO2 93 % 02/04/24 00:03  Vitals shown include unfiled device data.  Last Pain:  Vitals:   02/03/24 2130  TempSrc: Axillary  PainSc:          Complications: No notable events documented.

## 2024-02-04 NOTE — Progress Notes (Signed)
 [facility] Progress Note Patient: Anita [name] FMW:[id number] DOB: 07/30/1960  DOA: 02/03/2024 DOS: the patient was seen and examined on 02/04/2024  Brief Hospital Course: PMH of type II DM, depression, CAD, anxiety, HLD, CVA presents to the hospital with complaints of abdominal pain found to have obstructive uropathy with left ureteral stone. Brought to [facility].  Underwent cystoscopy and stent placement. Blood cultures positive for Klebsiella. Assessment and Plan: Sepsis secondary to complicated urinary tract infection, present on admission due to Klebsiella Patient presented with fever 100.8, tachycardia 106 as well as leukocytosis Blood cultures came back positive for Klebsiella. Continue ceftriaxone  dose increased from 1 mg to 2 mg. Follow-up on culture results   Acute abdominal pain secondary to ureteral calculi status post ureteral stent placement Patient underwent ureteral stent placement by urologist on 02/03/2024 Continue as needed pain medication Plan of care discussed with urologist, we appreciate input Will require definitive stone management outpatient ureteroscopy   Acute kidney injury secondary to obstructive uropathy in the setting of ureteral stone as well as possible underlying dehydration Metabolic acidosis Continue IV fluid Baseline serum creatinine 1.0.  On admission serum creatinine 2.71.  Currently about the same. Foley catheter in place   Hyponatremia secondary to volume depletion Continue IV fluid   Type II diabetes mellitus without long-term insulin  use Monitor glucose level closely.  Currently holding oral hypoglycemic agents. Continue sliding scale   Depression Anxiety disorder Continue Xanax  and buspirone    Coronary artery disease Continue aspirin  and statin therapy   Hyperlipidemia Continue statin therapy   Previous strokes Continue aspirin  and statin therapy   Chronic pain medication use. Chronically on oxycodone  15 mg 4 times daily as needed  as well as gabapentin  600 mg 3 times daily. Also on Zanaflex  8 mg 4 times daily. Currently dose adjusted according to renal function.   Subjective: No nausea no vomiting no fever no chills.  Denies any acute complaint.  Feeling better compared to yesterday.  Still has some pain more on the right side though.  Physical Exam: Clear to auscultation. Bowel sound present Tender on the right. Trace edema.  Data Reviewed: I have Reviewed nursing notes, Vitals, and Lab results. Since last encounter, pertinent lab results CBC and BMP   . I have ordered test including CBC and BMP  . I have discussed pt's care plan and test results with urology  .   Disposition: Status is: Inpatient Remains inpatient appropriate because: Monitor for improvement renal function and culture clearance  heparin  injection 5,000 Units Start: 02/04/24 0915 SCDs Start: 02/04/24 0007   Family Communication: No one at bedside Level of care: Med-Surg   Vitals:   02/04/24 0438 02/04/24 0624 02/04/24 0730 02/04/24 1549  BP: (!) 96/44 (!) 126/56 113/64 (!) 146/59  Pulse: 82 75 73 64  Resp:   16 16  Temp: 98 F (36.7 C)  (!) 97.4 F (36.3 C) 98.4 F (36.9 C)  TempSrc: Oral  Oral Oral  SpO2: 97% 97% 98% 99%  Weight:      Height:         Author: Yetta Blanch, MD 02/04/2024 6:02 PM  Please look on [email or web address] to find out who is on call.

## 2024-02-04 NOTE — Progress Notes (Signed)
 PHARMACY - PHYSICIAN COMMUNICATION CRITICAL VALUE ALERT - BLOOD CULTURE IDENTIFICATION (BCID)  Anita [name] is an 63 y.o. female who presented to [facility] on 02/03/2024 with a chief complaint of abdominal pain.  Assessment: GNR Klebsiella pneumoniae in 2/4 anaerobic bottles. Suspected urinary source.   Name of physician (or Provider) Contacted: Dr. Yetta Blanch  Current antibiotics: Ceftriaxone  2g IV  Changes to prescribed antibiotics recommended:  Patient is on recommended antibiotics - No changes needed  Results for orders placed or performed during the hospital encounter of 02/03/24  Blood Culture ID Panel (Reflexed) (Collected: 02/03/2024  5:55 PM)  Result Value Ref Range   Enterococcus faecalis NOT DETECTED NOT DETECTED   Enterococcus Faecium NOT DETECTED NOT DETECTED   Listeria monocytogenes NOT DETECTED NOT DETECTED   Staphylococcus species NOT DETECTED NOT DETECTED   Staphylococcus aureus (BCID) NOT DETECTED NOT DETECTED   Staphylococcus epidermidis NOT DETECTED NOT DETECTED   Staphylococcus lugdunensis NOT DETECTED NOT DETECTED   Streptococcus species NOT DETECTED NOT DETECTED   Streptococcus agalactiae NOT DETECTED NOT DETECTED   Streptococcus pneumoniae NOT DETECTED NOT DETECTED   Streptococcus pyogenes NOT DETECTED NOT DETECTED   A.calcoaceticus-baumannii NOT DETECTED NOT DETECTED   Bacteroides fragilis NOT DETECTED NOT DETECTED   Enterobacterales DETECTED (A) NOT DETECTED   Enterobacter cloacae complex NOT DETECTED NOT DETECTED   Escherichia coli NOT DETECTED NOT DETECTED   Klebsiella aerogenes NOT DETECTED NOT DETECTED   Klebsiella oxytoca NOT DETECTED NOT DETECTED   Klebsiella pneumoniae DETECTED (A) NOT DETECTED   Proteus species NOT DETECTED NOT DETECTED   Salmonella species NOT DETECTED NOT DETECTED   Serratia marcescens NOT DETECTED NOT DETECTED   Haemophilus influenzae NOT DETECTED NOT DETECTED   Neisseria meningitidis NOT DETECTED NOT DETECTED    Pseudomonas aeruginosa NOT DETECTED NOT DETECTED   Stenotrophomonas maltophilia NOT DETECTED NOT DETECTED   Candida albicans NOT DETECTED NOT DETECTED   Candida auris NOT DETECTED NOT DETECTED   Candida glabrata NOT DETECTED NOT DETECTED   Candida krusei NOT DETECTED NOT DETECTED   Candida parapsilosis NOT DETECTED NOT DETECTED   Candida tropicalis NOT DETECTED NOT DETECTED   Cryptococcus neoformans/gattii NOT DETECTED NOT DETECTED   CTX-M ESBL NOT DETECTED NOT DETECTED   Carbapenem resistance IMP NOT DETECTED NOT DETECTED   Carbapenem resistance KPC NOT DETECTED NOT DETECTED   Carbapenem resistance NDM NOT DETECTED NOT DETECTED   Carbapenem resist OXA 48 LIKE NOT DETECTED NOT DETECTED   Carbapenem resistance VIM NOT DETECTED NOT DETECTED    Spruha Weight M Blaine Guiffre 02/04/2024  12:58 PM
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
## 2024-02-04 NOTE — Hospital Course (Signed)
 PMH of type II DM, depression, CAD, anxiety, HLD, CVA presents to the hospital with complaints of abdominal pain found to have obstructive uropathy with left ureteral stone. Brought to [facility].  Underwent cystoscopy and stent placement. Blood cultures positive for Klebsiella. Monitor overnight for stability of the CBGs  Assessment and Plan: Sepsis secondary to complicated urinary tract infection, present on admission due to Klebsiella Patient presented with fever 100.8, tachycardia 106 as well as leukocytosis Blood cultures came back positive for Klebsiella. On ceftriaxone  2 mg in the hospital. Upon discharge could be a candidate for ciprofloxacin  therapy. Follow-up on culture results   Nephrolithiasis s/p ureteral stent placement Patient underwent ureteral stent placement by urologist on 02/03/2024 Will require definitive stone management outpatient ureteroscopy   Acute kidney injury secondary to obstructive uropathy in the setting of ureteral stone as well as possible underlying dehydration Metabolic acidosis Treated with IV fluid Baseline serum creatinine 1.0.  On admission serum creatinine 2.71.  Improved with IV hydration and worsening again. Received IV fluid. Foley catheter removed.  Hyponatremia secondary to volume depletion Treated with IV fluid   Type II diabetes mellitus without long-term insulin  use Oral hypoglycemic agents at home. CBG significantly elevated for some reason on 9/9 at 410. After treatment with insulin  and CBG reduced to 40. BMP correlates with CBG after this. Will monitor overnight for stability. Continue sliding scale   Depression Anxiety disorder Continue Xanax  and buspirone    Coronary artery disease Continue aspirin  and statin therapy   Hyperlipidemia Continue statin therapy   Previous strokes Continue aspirin  and statin therapy   Chronic pain medication use. Chronically on oxycodone  15 mg 4 times daily as needed as well as gabapentin  600 mg  3 times daily. Also on Zanaflex  8 mg 4 times daily. Currently dose adjusted according to renal function.

## 2024-02-04 NOTE — Progress Notes (Signed)
 Urology Inpatient Progress Report  Sepsis (HCC) [A41.9]  Procedure(s): Cystoscopy with left retrograde pyelogram and left ureteral stent placement  1 Day Post-Op   Intv/Subj: No acute events overnight. Patient is without complaint.  Patient feels and looks much improved today.  Blood culture positive for Klebsiella pneumoniae  Principal Problem:   Sepsis (HCC)  Current Facility-Administered Medications  Medication Dose Route Frequency Provider Last Rate Last Admin   0.9 %  sodium chloride  infusion   Intravenous Continuous Patel, Pranav M, MD 100 mL/hr at 02/04/24 1432 New Bag at 02/04/24 1432   ALPRAZolam  (XANAX ) tablet 0.25 mg  0.25 mg Oral BID PRN Dorinda Drue DASEN, MD       aspirin  EC tablet 81 mg  81 mg Oral Daily Djan, Prince T, MD   81 mg at 02/04/24 0913   atorvastatin  (LIPITOR) tablet 20 mg  20 mg Oral Daily Djan, Prince T, MD   20 mg at 02/04/24 0913   busPIRone  (BUSPAR ) tablet 10 mg  10 mg Oral BID Djan, Prince T, MD   10 mg at 02/04/24 0913   cefTRIAXone  (ROCEPHIN ) 2 g in sodium chloride  0.9 % 100 mL IVPB  2 g Intravenous Q24H Patel, Pranav M, MD       Chlorhexidine  Gluconate Cloth 2 % PADS 6 each  6 each Topical Daily Patel, Pranav M, MD       gabapentin  (NEURONTIN ) capsule 200 mg  200 mg Oral TID Patel, Pranav M, MD   200 mg at 02/04/24 0913   heparin  injection 5,000 Units  5,000 Units Subcutaneous Q8H Patel, Pranav M, MD   5,000 Units at 02/04/24 0913   [START ON 02/05/2024] influenza vac split trivalent PF (FLUZONE ) injection 0.5 mL  0.5 mL Intramuscular Tomorrow-1000 Dorinda Drue T, MD       insulin  aspart (novoLOG ) injection 0-5 Units  0-5 Units Subcutaneous QHS Dorinda Drue T, MD       insulin  aspart (novoLOG ) injection 0-9 Units  0-9 Units Subcutaneous TID WC Dorinda Drue DASEN, MD   3 Units at 02/04/24 1233   isosorbide  mononitrate (IMDUR ) 24 hr tablet 30 mg  30 mg Oral Daily Patel, Pranav M, MD   30 mg at 02/04/24 9085   oxyCODONE  (Oxy IR/ROXICODONE ) immediate release  tablet 15 mg  15 mg Oral Q4H PRN Patel, Pranav M, MD       promethazine  (PHENERGAN ) tablet 25 mg  25 mg Oral Q6H PRN Patel, Pranav M, MD       tiZANidine  (ZANAFLEX ) tablet 4 mg  4 mg Oral Q8H PRN Tobie Yetta HERO, MD         Objective: Vital: Vitals:   02/04/24 0438 02/04/24 0624 02/04/24 0730 02/04/24 1549  BP: (!) 96/44 (!) 126/56 113/64 (!) 146/59  Pulse: 82 75 73 64  Resp:   16 16  Temp: 98 F (36.7 C)  (!) 97.4 F (36.3 C) 98.4 F (36.9 C)  TempSrc: Oral  Oral Oral  SpO2: 97% 97% 98% 99%  Weight:      Height:       I/Os: I/O last 3 completed shifts: In: 1162.1 [I.V.:1062.1; IV Piggyback:100] Out: 350 [Urine:350]  Physical Exam:  General: Patient is in no apparent distress Lungs: Normal respiratory effort, chest expands symmetrically. GI: The abdomen is soft and nontender without mass. Foley: Draining clear yellow urine Ext: lower extremities symmetric  Lab Results: Recent Labs    02/03/24 1748 02/04/24 0400  WBC 15.2* 20.7*  HGB 12.9 10.5*  HCT  39.4 32.3*   Recent Labs    02/03/24 1748 02/04/24 0400  NA 134* 137  K 4.1 4.3  CL 99 102  CO2 21* 19*  GLUCOSE 179* 257*  BUN 41* 36*  CREATININE 2.71* 2.69*  CALCIUM  9.2 8.3*   Recent Labs    02/03/24 1748  INR 1.1   No results for input(s): LABURIN in the last 72 hours. Results for orders placed or performed during the hospital encounter of 02/03/24  Culture, blood (Routine x 2)     Status: None (Preliminary result)   Collection Time: 02/03/24  5:48 PM   Specimen: BLOOD  Result Value Ref Range Status   Specimen Description   Final    BLOOD BLOOD LEFT ARM Performed at [facility], [address] [place], [place] [address]    Special Requests   Final    BOTTLES DRAWN AEROBIC AND ANAEROBIC Blood Culture adequate volume Performed at [facility], [address], [place], [place] [address]    Culture  Setup Time   Final    GRAM NEGATIVE RODS ANAEROBIC BOTTLE ONLY Gram Stain Report Called  to,Read Back By and Verified With: CALWITAN @ 0950 ON [id number] BY HENDERSON L CRITICAL VALUE NOTED.  VALUE IS CONSISTENT WITH PREVIOUSLY REPORTED AND CALLED VALUE. Performed at [facility], [address], [place], [place] [address]    Culture GRAM NEGATIVE RODS  Final   Report Status PENDING  Incomplete  Culture, blood (Routine x 2)     Status: None (Preliminary result)   Collection Time: 02/03/24  5:55 PM   Specimen: BLOOD  Result Value Ref Range Status   Specimen Description   Final    BLOOD BLOOD LEFT FOREARM Performed at [facility], [address], [place], [place] [address]    Special Requests   Final    BOTTLES DRAWN AEROBIC AND ANAEROBIC Blood Culture adequate volume Performed at [facility], [address] [place], [place] [address]    Culture  Setup Time   Final    GRAM NEGATIVE RODS ANAEROBIC BOTTLE ONLY Gram Stain Report Called to,Read Back By and Verified With: CALVITAN @ 0905 ON [id number] BY HENDERSON L CRITICAL RESULT CALLED TO, READ BACK BY AND VERIFIED WITH: PHARMD JANE D ON [id number] @1244  BY SM Performed at [facility], [address], [place], [place] [address]    Culture GRAM NEGATIVE RODS  Final   Report Status PENDING  Incomplete  Blood Culture ID Panel (Reflexed)     Status: Abnormal   Collection Time: 02/03/24  5:55 PM  Result Value Ref Range Status   Enterococcus faecalis NOT DETECTED NOT DETECTED Final   Enterococcus Faecium NOT DETECTED NOT DETECTED Final   Listeria monocytogenes NOT DETECTED NOT DETECTED Final   Staphylococcus species NOT DETECTED NOT DETECTED Final   Staphylococcus aureus (BCID) NOT DETECTED NOT DETECTED Final   Staphylococcus epidermidis NOT DETECTED NOT DETECTED Final   Staphylococcus lugdunensis NOT DETECTED NOT DETECTED Final   Streptococcus species NOT DETECTED NOT DETECTED Final   Streptococcus agalactiae NOT DETECTED NOT DETECTED Final   Streptococcus pneumoniae NOT DETECTED NOT DETECTED Final   Streptococcus  pyogenes NOT DETECTED NOT DETECTED Final   A.calcoaceticus-baumannii NOT DETECTED NOT DETECTED Final   Bacteroides fragilis NOT DETECTED NOT DETECTED Final   Enterobacterales DETECTED (A) NOT DETECTED Final    Comment: Enterobacterales represent a large order of gram negative bacteria, not a single organism. CRITICAL RESULT CALLED TO, READ BACK BY AND VERIFIED WITH:  PHARMD SLATER BIRCH ON [id number] @1244  BY SM    Enterobacter cloacae complex NOT DETECTED NOT DETECTED Final   Escherichia coli NOT DETECTED NOT DETECTED Final   Klebsiella aerogenes NOT DETECTED NOT DETECTED Final   Klebsiella oxytoca NOT DETECTED NOT DETECTED Final   Klebsiella pneumoniae DETECTED (A) NOT DETECTED Final    Comment: CRITICAL RESULT CALLED TO, READ BACK BY AND VERIFIED WITH: PHARMD JANE D ON [id number] @1244  BY SM    Proteus species NOT DETECTED NOT DETECTED Final   Salmonella species NOT DETECTED NOT DETECTED Final   Serratia marcescens NOT DETECTED NOT DETECTED Final   Haemophilus influenzae NOT DETECTED NOT DETECTED Final   Neisseria meningitidis NOT DETECTED NOT DETECTED Final   Pseudomonas aeruginosa NOT DETECTED NOT DETECTED Final   Stenotrophomonas maltophilia NOT DETECTED NOT DETECTED Final   Candida albicans NOT DETECTED NOT DETECTED Final   Candida auris NOT DETECTED NOT DETECTED Final   Candida glabrata NOT DETECTED NOT DETECTED Final   Candida krusei NOT DETECTED NOT DETECTED Final   Candida parapsilosis NOT DETECTED NOT DETECTED Final   Candida tropicalis NOT DETECTED NOT DETECTED Final   Cryptococcus neoformans/gattii NOT DETECTED NOT DETECTED Final   CTX-M ESBL NOT DETECTED NOT DETECTED Final   Carbapenem resistance IMP NOT DETECTED NOT DETECTED Final   Carbapenem resistance KPC NOT DETECTED NOT DETECTED Final   Carbapenem resistance NDM NOT DETECTED NOT DETECTED Final   Carbapenem resist OXA 48 LIKE NOT DETECTED NOT DETECTED Final   Carbapenem resistance VIM NOT DETECTED NOT DETECTED Final     Comment: Performed at [facility], [address], [place], [place] [address]    Studies/Results: CT ABDOMEN PELVIS WO CONTRAST Result Date: 02/03/2024 EXAM: CT ABDOMEN AND PELVIS WITHOUT CONTRAST 02/03/2024 07:54:05 PM TECHNIQUE: CT of the abdomen and pelvis was performed without the administration of intravenous contrast. Multiplanar reformatted images are provided for review. Automated exposure control, iterative reconstruction, and/or weight-based adjustment of the mA/kV was utilized to reduce the radiation dose to as low as reasonably achievable. COMPARISON: Comparison with CT dated 05/03/22. CLINICAL HISTORY: Abdominal pain, acute, nonlocalized. Urinary frequency, gen abd pain. ; c/o urinary frequency and pain during urination x 3 days. Pt also c/o left side abd pain since yesterday. FINDINGS: LOWER CHEST: No acute abnormality. LIVER: The liver is unremarkable. GALLBLADDER AND BILE DUCTS: No evidence of cholecystitis. Layering sludge in the gallbladder. SPLEEN: No acute abnormality. PANCREAS: No acute abnormality. ADRENAL GLANDS: No acute abnormality. KIDNEYS, URETERS AND BLADDER: Asymmetric left greater than right perinephric stranding. Mild left hydronephrosis upstream from a 9 mm stone in the proximal left ureter. Gas within the bladder. GI AND BOWEL: Stomach demonstrates no acute abnormality. There is no bowel obstruction. PERITONEUM AND RETROPERITONEUM: No free air. VASCULATURE: Aorta is normal in caliber. LYMPH NODES: No lymphadenopathy. REPRODUCTIVE ORGANS: No acute abnormality. BONES AND SOFT TISSUES: No acute osseous abnormality. No focal soft tissue abnormality. IMPRESSION: 1. Mild left hydronephrosis upstream from a 9 mm stone in the proximal left ureter. 2. Asymmetric left greater than right perinephric stranding maybe due to obstruction or pyelonephritis . 3. Gas within the bladder correlate for recent instrumentation or cystitis . Electronically signed by: Norman Gatlin MD 02/03/2024  08:03 PM EDT RP Workstation: [id number]   DG Chest Port 1 View if patient is in a treatment room. Result Date: 02/03/2024 CLINICAL DATA:  Suspected sepsis EXAM: PORTABLE CHEST 1 VIEW COMPARISON:  03/18/2023 FINDINGS: Prior median sternotomy. Heart is borderline in size. No confluent airspace  opacities or effusions. No edema. No acute bony abnormality. IMPRESSION: Borderline heart size.  No active disease. Electronically Signed   By: Franky Crease M.D.   On: 02/03/2024 18:10    Assessment: Left ureteral calculus Left ureteral obstruction secondary to calculus Severe sepsis secondary to UTI  Procedure(s): Cystoscopy with left retrograde pyelogram and left ureteral stent placement, 1 Day Post-Op  doing well.  Plan: Will plan for ureteroscopy in a couple weeks.  Continue IV antibiotics until speciation/sensitivities return at which point she can be discharged on appropriate oral antibiotics   Sherwood Edison, MD Urology 02/04/2024, 4:44 PM

## 2024-02-05 DIAGNOSIS — R6520 Severe sepsis without septic shock: Secondary | ICD-10-CM | POA: Diagnosis not present

## 2024-02-05 DIAGNOSIS — N179 Acute kidney failure, unspecified: Secondary | ICD-10-CM | POA: Diagnosis not present

## 2024-02-05 DIAGNOSIS — A4151 Sepsis due to Escherichia coli [E. coli]: Secondary | ICD-10-CM | POA: Diagnosis not present

## 2024-02-05 LAB — BASIC METABOLIC PANEL WITH GFR
Anion gap: 14 (ref 5–15)
BUN: 35 mg/dL — ABNORMAL HIGH (ref 8–23)
CO2: 19 mmol/L — ABNORMAL LOW (ref 22–32)
Calcium: 8.7 mg/dL — ABNORMAL LOW (ref 8.9–10.3)
Chloride: 104 mmol/L (ref 98–111)
Creatinine, Ser: 1.73 mg/dL — ABNORMAL HIGH (ref 0.44–1.00)
GFR, Estimated: 33 mL/min — ABNORMAL LOW (ref >60)
Glucose, Bld: 170 mg/dL — ABNORMAL HIGH (ref 70–99)
Potassium: 3.8 mmol/L (ref 3.5–5.1)
Sodium: 137 mmol/L (ref 135–145)

## 2024-02-05 LAB — GLUCOSE, CAPILLARY
Glucose-Capillary: 148 mg/dL — ABNORMAL HIGH (ref 70–99)
Glucose-Capillary: 133 mg/dL — ABNORMAL HIGH (ref 70–99)
Glucose-Capillary: 105 mg/dL — ABNORMAL HIGH (ref 70–99)
Glucose-Capillary: 107 mg/dL — ABNORMAL HIGH (ref 70–99)
Glucose-Capillary: 147 mg/dL — ABNORMAL HIGH (ref 70–99)

## 2024-02-05 LAB — CBC
HCT: 36.8 % (ref 36.0–46.0)
Hemoglobin: 11.9 g/dL — ABNORMAL LOW (ref 12.0–15.0)
MCH: 28.3 pg (ref 26.0–34.0)
MCHC: 32.3 g/dL (ref 30.0–36.0)
MCV: 87.6 fL (ref 80.0–100.0)
Platelets: 122 K/uL — ABNORMAL LOW (ref 150–400)
RBC: 4.2 MIL/uL (ref 3.87–5.11)
RDW: 15.2 % (ref 11.5–15.5)
WBC: 11.3 K/uL — ABNORMAL HIGH (ref 4.0–10.5)
nRBC: 0 % (ref 0.0–0.2)

## 2024-02-05 LAB — MAGNESIUM: Magnesium: 1.8 mg/dL (ref 1.7–2.4)

## 2024-02-05 LAB — URINE CULTURE: Culture: <10000 — AB

## 2024-02-05 MED ORDER — METOPROLOL TARTRATE 50 MG PO TABS
50.0000 mg | ORAL_TABLET | Freq: Two times a day (BID) | ORAL | Status: DC
  Administered 2024-02-05 – 2024-02-06 (×3): 50 mg via ORAL
  Filled 2024-02-05 (×3): qty 1

## 2024-02-05 NOTE — Progress Notes (Signed)
 [facility] Progress [facility] Patient: Anita [name] FMW:[id number] DOB: 12-31-1960  DOA: 02/03/2024 DOS: the patient was seen and examined on 02/05/2024  Brief Hospital Course: PMH of type II DM, depression, CAD, anxiety, HLD, CVA presents to the hospital with complaints of abdominal pain found to have obstructive uropathy with left ureteral stone. Brought to [facility]  Underwent cystoscopy and stent placement. Blood cultures positive for Klebsiella. Assessment and Plan: Sepsis secondary to complicated urinary tract infection, present on admission due to Klebsiella Patient presented with fever 100.8, tachycardia 106 as well as leukocytosis Blood cultures came back positive for Klebsiella. Continue ceftriaxone  dose increased from 1 mg to 2 mg. Follow-up on culture results   Acute abdominal pain secondary to ureteral calculi status post ureteral stent placement Patient underwent ureteral stent placement by urologist on 02/03/2024 Continue as needed pain medication Plan of care discussed with urologist, we appreciate input Will require definitive stone management outpatient ureteroscopy   Acute kidney injury secondary to obstructive uropathy in the setting of ureteral stone as well as possible underlying dehydration Metabolic acidosis Continue IV fluid Baseline serum creatinine 1.0.  On admission serum creatinine 2.71.  Currently about the same. Foley catheter removed.   Hyponatremia secondary to volume depletion Continue IV fluid   Type II diabetes mellitus without long-term insulin  use Monitor glucose level closely.  Currently holding oral hypoglycemic agents. Continue sliding scale   Depression Anxiety disorder Continue Xanax  and buspirone    Coronary artery disease Continue aspirin  and statin therapy   Hyperlipidemia Continue statin therapy   Previous strokes Continue aspirin  and statin therapy   Chronic pain medication use. Chronically on oxycodone  15 mg 4 times daily as needed  as well as gabapentin  600 mg 3 times daily. Also on Zanaflex  8 mg 4 times daily. Currently dose adjusted according to renal function.   Subjective: No nausea no vomiting no fever no chills no chest pain.  Reports incontinence of urine.  Physical Exam: Clear to auscultation. S1-S2 present Bowel sound present.  Diffusely tender in the middle abdominal area. No edema.  Data Reviewed: I have Reviewed nursing notes, Vitals, and Lab results. Since last encounter, pertinent lab results CBC and BMP   . I have ordered test including CBC and BMP  .   Disposition: Status is: Inpatient Remains inpatient appropriate because: Monitor for improvement in abdominal pain and oral intake  heparin  injection 5,000 Units Start: 02/04/24 0915 SCDs Start: 02/04/24 0007   Family Communication: No one at bedside Level of care: Med-Surg   Vitals:   02/05/24 0649 02/05/24 0741 02/05/24 1455 02/05/24 1602  BP: (!) 180/73 (!) 168/93 (!) 193/85 (!) 184/70  Pulse: 83 (!) 105 65 86  Resp: 17 16 16    Temp: 97.9 F (36.6 C) 98.4 F (36.9 C) 98.1 F (36.7 C)   TempSrc: Oral Oral Oral   SpO2: 97% 98% 99% 97%  Weight:      Height:         Author: Yetta Blanch, MD 02/05/2024 6:31 PM  Please look on [email or web address] to find out who is on call.

## 2024-02-05 NOTE — Plan of Care (Signed)

## 2024-02-05 NOTE — Plan of Care (Signed)
  Problem: Pain Managment: Goal: General experience of comfort will improve and/or be controlled Outcome: Progressing   Problem: Safety: Goal: Ability to remain free from injury will improve Outcome: Progressing

## 2024-02-06 ENCOUNTER — Encounter (HOSPITAL_COMMUNITY): Payer: Self-pay | Admitting: Urology

## 2024-02-06 DIAGNOSIS — R6520 Severe sepsis without septic shock: Secondary | ICD-10-CM | POA: Diagnosis not present

## 2024-02-06 DIAGNOSIS — A4151 Sepsis due to Escherichia coli [E. coli]: Secondary | ICD-10-CM | POA: Diagnosis not present

## 2024-02-06 DIAGNOSIS — N179 Acute kidney failure, unspecified: Secondary | ICD-10-CM | POA: Diagnosis not present

## 2024-02-06 LAB — CBC
HCT: 28.9 % — ABNORMAL LOW (ref 36.0–46.0)
Hemoglobin: 9.2 g/dL — ABNORMAL LOW (ref 12.0–15.0)
MCH: 28 pg (ref 26.0–34.0)
MCHC: 31.8 g/dL (ref 30.0–36.0)
MCV: 87.8 fL (ref 80.0–100.0)
Platelets: 117 K/uL — ABNORMAL LOW (ref 150–400)
RBC: 3.29 MIL/uL — ABNORMAL LOW (ref 3.87–5.11)
RDW: 15.1 % (ref 11.5–15.5)
WBC: 6.3 K/uL (ref 4.0–10.5)
nRBC: 0 % (ref 0.0–0.2)

## 2024-02-06 LAB — BASIC METABOLIC PANEL WITH GFR
Anion gap: 11 (ref 5–15)
BUN: 26 mg/dL — ABNORMAL HIGH (ref 8–23)
CO2: 17 mmol/L — ABNORMAL LOW (ref 22–32)
Calcium: 6.8 mg/dL — ABNORMAL LOW (ref 8.9–10.3)
Chloride: 112 mmol/L — ABNORMAL HIGH (ref 98–111)
Creatinine, Ser: 1.14 mg/dL — ABNORMAL HIGH (ref 0.44–1.00)
GFR, Estimated: 54 mL/min — ABNORMAL LOW (ref >60)
Glucose, Bld: 120 mg/dL — ABNORMAL HIGH (ref 70–99)
Potassium: 3.1 mmol/L — ABNORMAL LOW (ref 3.5–5.1)
Sodium: 140 mmol/L (ref 135–145)

## 2024-02-06 LAB — GLUCOSE, CAPILLARY
Glucose-Capillary: 135 mg/dL — ABNORMAL HIGH (ref 70–99)
Glucose-Capillary: 150 mg/dL — ABNORMAL HIGH (ref 70–99)
Glucose-Capillary: 171 mg/dL — ABNORMAL HIGH (ref 70–99)
Glucose-Capillary: 162 mg/dL — ABNORMAL HIGH (ref 70–99)

## 2024-02-06 LAB — MAGNESIUM: Magnesium: 1.3 mg/dL — ABNORMAL LOW (ref 1.7–2.4)

## 2024-02-06 MED ORDER — METOPROLOL TARTRATE 50 MG PO TABS
50.0000 mg | ORAL_TABLET | Freq: Once | ORAL | Status: AC
  Administered 2024-02-06: 50 mg via ORAL
  Filled 2024-02-06: qty 1

## 2024-02-06 MED ORDER — AMLODIPINE BESYLATE 5 MG PO TABS
5.0000 mg | ORAL_TABLET | Freq: Every day | ORAL | Status: DC
  Administered 2024-02-06 – 2024-02-08 (×3): 5 mg via ORAL
  Filled 2024-02-06 (×3): qty 1

## 2024-02-06 MED ORDER — ONDANSETRON HCL 4 MG/2ML IJ SOLN: 4.0000 mg | Freq: Four times a day (QID) | INTRAMUSCULAR | Status: DC | PRN

## 2024-02-06 MED ORDER — GABAPENTIN 400 MG PO CAPS
400.0000 mg | ORAL_CAPSULE | Freq: Three times a day (TID) | ORAL | Status: DC
  Administered 2024-02-06 – 2024-02-08 (×6): 400 mg via ORAL
  Filled 2024-02-06 (×6): qty 1

## 2024-02-06 MED ORDER — ISOSORBIDE MONONITRATE ER 30 MG PO TB24
30.0000 mg | ORAL_TABLET | Freq: Once | ORAL | Status: AC
  Administered 2024-02-06: 30 mg via ORAL
  Filled 2024-02-06: qty 1

## 2024-02-06 MED ORDER — MAGNESIUM SULFATE 4 GM/100ML IV SOLN
4.0000 g | Freq: Once | INTRAVENOUS | Status: AC
  Administered 2024-02-06: 4 g via INTRAVENOUS
  Filled 2024-02-06: qty 100

## 2024-02-06 MED ORDER — METOPROLOL TARTRATE 50 MG PO TABS
100.0000 mg | ORAL_TABLET | Freq: Two times a day (BID) | ORAL | Status: DC
  Administered 2024-02-06 – 2024-02-08 (×4): 100 mg via ORAL
  Filled 2024-02-06 (×4): qty 2

## 2024-02-06 MED ORDER — DOCUSATE SODIUM 100 MG PO CAPS
100.0000 mg | ORAL_CAPSULE | Freq: Two times a day (BID) | ORAL | Status: DC
  Administered 2024-02-06 – 2024-02-08 (×5): 100 mg via ORAL
  Filled 2024-02-06 (×5): qty 1

## 2024-02-06 MED ORDER — ISOSORBIDE MONONITRATE ER 30 MG PO TB24
60.0000 mg | ORAL_TABLET | Freq: Every day | ORAL | Status: DC
Start: 2024-02-07 — End: 2024-02-08
  Administered 2024-02-07 – 2024-02-08 (×2): 60 mg via ORAL
  Filled 2024-02-06 (×2): qty 2

## 2024-02-06 MED ORDER — POTASSIUM CHLORIDE CRYS ER 20 MEQ PO TBCR
40.0000 meq | EXTENDED_RELEASE_TABLET | ORAL | Status: AC
  Administered 2024-02-06 (×2): 40 meq via ORAL
  Filled 2024-02-06 (×2): qty 2

## 2024-02-06 MED ORDER — ACETAMINOPHEN 325 MG PO TABS: 650.0000 mg | ORAL_TABLET | Freq: Four times a day (QID) | ORAL | Status: DC | PRN

## 2024-02-06 NOTE — Plan of Care (Signed)
  Problem: Pain Managment: Goal: General experience of comfort will improve and/or be controlled Outcome: Progressing   Problem: Safety: Goal: Ability to remain free from injury will improve Outcome: Progressing

## 2024-02-06 NOTE — Plan of Care (Signed)

## 2024-02-06 NOTE — Progress Notes (Signed)
 [facility] Progress Note Patient: Anita [name] FMW:[id number] DOB: [birth year]  DOA: 02/03/2024 DOS: the patient was seen and examined on 02/06/2024  Brief Hospital Course: PMH of type II DM, depression, CAD, anxiety, HLD, CVA presents to the hospital with complaints of abdominal pain found to have obstructive uropathy with left ureteral stone. Brought to [facility].  Underwent cystoscopy and stent placement. Blood cultures positive for Klebsiella. Assessment and Plan: Sepsis secondary to complicated urinary tract infection, present on admission due to Klebsiella Patient presented with fever 100.8, tachycardia 106 as well as leukocytosis Blood cultures came back positive for Klebsiella. Continue ceftriaxone  dose increased from 1 mg to 2 mg. Follow-up on culture results   Acute abdominal pain secondary to ureteral calculi status post ureteral stent placement Patient underwent ureteral stent placement by urologist on 02/03/2024 Will require definitive stone management outpatient ureteroscopy   Acute kidney injury secondary to obstructive uropathy in the setting of ureteral stone as well as possible underlying dehydration Metabolic acidosis Treated with IV fluid Baseline serum creatinine 1.0.  On admission serum creatinine 2.71.  Currently improving.  Normal Foley catheter removed.   Hyponatremia secondary to volume depletion Treated with IV fluid   Type II diabetes mellitus without long-term insulin  use Monitor glucose level closely.  Currently holding oral hypoglycemic agents. Continue sliding scale   Depression Anxiety disorder Continue Xanax  and buspirone    Coronary artery disease Continue aspirin  and statin therapy   Hyperlipidemia Continue statin therapy   Previous strokes Continue aspirin  and statin therapy   Chronic pain medication use. Chronically on oxycodone  15 mg 4 times daily as needed as well as gabapentin  600 mg 3 times daily. Also on Zanaflex  8 mg 4 times  daily. Currently dose adjusted according to renal function.   Subjective: No nausea or vomiting.  No diarrhea.  No blood in the stool.  Physical Exam: Clear to auscultation. Bowel sound present. Left-sided tenderness unchanged. No edema.  Data Reviewed: I have Reviewed nursing notes, Vitals, and Lab results. Since last encounter, pertinent lab results CBC and BMP   . I have ordered test including CBC and BMP  .   Disposition: Status is: Inpatient Remains inpatient appropriate because: Culture sensitivity pending  heparin  injection 5,000 Units Start: 02/04/24 0915 SCDs Start: 02/04/24 0007   Family Communication: Family at bedside Level of care: Med-Surg   Vitals:   02/06/24 1607 02/06/24 1614 02/06/24 1623 02/06/24 1721  BP: (!) 196/85 (!) 196/85 (!) 196/85 (!) 190/71  Pulse: (!) 58  (!) 58 60  Resp: 18     Temp: 97.9 F (36.6 C)   97.7 F (36.5 C)  TempSrc: Oral   Oral  SpO2: 98%   99%  Weight:      Height:         Author: Yetta Blanch, MD 02/06/2024 6:37 PM  Please look on [email or web address] to find out who is on call.

## 2024-02-07 ENCOUNTER — Encounter (HOSPITAL_COMMUNITY): Payer: Self-pay | Admitting: Internal Medicine

## 2024-02-07 DIAGNOSIS — A4151 Sepsis due to Escherichia coli [E. coli]: Secondary | ICD-10-CM | POA: Diagnosis not present

## 2024-02-07 DIAGNOSIS — N179 Acute kidney failure, unspecified: Secondary | ICD-10-CM | POA: Diagnosis not present

## 2024-02-07 DIAGNOSIS — R6520 Severe sepsis without septic shock: Secondary | ICD-10-CM | POA: Diagnosis not present

## 2024-02-07 LAB — BASIC METABOLIC PANEL WITH GFR
Anion gap: 16 — ABNORMAL HIGH (ref 5–15)
Anion gap: 13 (ref 5–15)
BUN: 29 mg/dL — ABNORMAL HIGH (ref 8–23)
BUN: 29 mg/dL — ABNORMAL HIGH (ref 8–23)
CO2: 20 mmol/L — ABNORMAL LOW (ref 22–32)
CO2: 20 mmol/L — ABNORMAL LOW (ref 22–32)
Calcium: 8.7 mg/dL — ABNORMAL LOW (ref 8.9–10.3)
Calcium: 8.6 mg/dL — ABNORMAL LOW (ref 8.9–10.3)
Chloride: 100 mmol/L (ref 98–111)
Chloride: 105 mmol/L (ref 98–111)
Creatinine, Ser: 1.42 mg/dL — ABNORMAL HIGH (ref 0.44–1.00)
Creatinine, Ser: 1.39 mg/dL — ABNORMAL HIGH (ref 0.44–1.00)
GFR, Estimated: 42 mL/min — ABNORMAL LOW (ref >60)
GFR, Estimated: 43 mL/min — ABNORMAL LOW (ref >60)
Glucose, Bld: 137 mg/dL — ABNORMAL HIGH (ref 70–99)
Glucose, Bld: 106 mg/dL — ABNORMAL HIGH (ref 70–99)
Potassium: 4.6 mmol/L (ref 3.5–5.1)
Potassium: 4 mmol/L (ref 3.5–5.1)
Sodium: 136 mmol/L (ref 135–145)
Sodium: 138 mmol/L (ref 135–145)

## 2024-02-07 LAB — CBC
HCT: 36.9 % (ref 36.0–46.0)
Hemoglobin: 11.7 g/dL — ABNORMAL LOW (ref 12.0–15.0)
MCH: 27.8 pg (ref 26.0–34.0)
MCHC: 31.7 g/dL (ref 30.0–36.0)
MCV: 87.6 fL (ref 80.0–100.0)
Platelets: 164 K/uL (ref 150–400)
RBC: 4.21 MIL/uL (ref 3.87–5.11)
RDW: 15.2 % (ref 11.5–15.5)
WBC: 6.7 K/uL (ref 4.0–10.5)
nRBC: 0 % (ref 0.0–0.2)

## 2024-02-07 LAB — CULTURE, BLOOD (ROUTINE X 2)
Special Requests: ADEQUATE
Special Requests: ADEQUATE

## 2024-02-07 LAB — GLUCOSE, CAPILLARY
Glucose-Capillary: 145 mg/dL — ABNORMAL HIGH (ref 70–99)
Glucose-Capillary: 410 mg/dL — ABNORMAL HIGH (ref 70–99)
Glucose-Capillary: 107 mg/dL — ABNORMAL HIGH (ref 70–99)
Glucose-Capillary: 44 mg/dL — CL (ref 70–99)
Glucose-Capillary: 202 mg/dL — ABNORMAL HIGH (ref 70–99)

## 2024-02-07 LAB — MAGNESIUM: Magnesium: 2.6 mg/dL — ABNORMAL HIGH (ref 1.7–2.4)

## 2024-02-07 MED ORDER — SODIUM CHLORIDE 0.9 % IV BOLUS: 1000.0000 mL | Freq: Once | INTRAVENOUS | Status: DC

## 2024-02-07 MED ORDER — SODIUM CHLORIDE 0.9 % IV BOLUS
1500.0000 mL | Freq: Once | INTRAVENOUS | Status: AC
  Administered 2024-02-07: 1500 mL via INTRAVENOUS

## 2024-02-07 MED ORDER — SODIUM CHLORIDE 0.9 % IV SOLN: INTRAVENOUS | Status: DC

## 2024-02-07 MED ORDER — HYDRALAZINE HCL 25 MG PO TABS
25.0000 mg | ORAL_TABLET | Freq: Three times a day (TID) | ORAL | Status: DC
  Administered 2024-02-07 – 2024-02-08 (×3): 25 mg via ORAL
  Filled 2024-02-07 (×3): qty 1

## 2024-02-07 MED ORDER — INSULIN ASPART 100 UNIT/ML IJ SOLN
22.0000 [IU] | Freq: Once | INTRAMUSCULAR | Status: AC
  Administered 2024-02-07: 22 [IU] via SUBCUTANEOUS

## 2024-02-07 NOTE — Evaluation (Signed)
 Physical Therapy Brief Evaluation and Discharge Note Patient Details Name: Anita [name] MRN: [id number] DOB: Oct 12, 1960 Today's Date: 02/07/2024   History of Present Illness  Pt is 63 yo female who presents on 02/03/24 with abdominal pain and found to have obstructive uropathy with L ureteral stone. L ureteral stent placed same day. Blood culture + for Klebsiella pneumoniae.  PMH: DMII, depression, CAD, anxiety, HLD, CVA, chronic back/ neck pain, migraines  Clinical Impression  Patient evaluated by Physical Therapy with no further acute PT needs identified. All education has been completed and the patient has no further questions. Pt from home with husband who has AKA but is independent and drives and just got his prosthesis. Pt independent in the home, activity limitations arise from chronic back pain. Pt's abdominal pain has resolved. Stressed the importance of frequent mobility upon return home. Pt ambulated 250' with RW and 250' with no AD without LOB or significant gait dysfunction, just slowed pace.  PT is signing off. Thank you for this referral.        PT Assessment Patient does not need any further PT services  Assistance Needed at Discharge  PRN    Equipment Recommendations None recommended by PT  Recommendations for Other Services       Precautions/Restrictions Precautions Precautions: Fall Restrictions Weight Bearing Restrictions Per Provider Order: No        Mobility  Bed Mobility Rolling: Independent Supine/Sidelying to sit: Independent Sit to supine/sidelying: Independent    Transfers Overall transfer level: Modified independent Equipment used: None, Rolling walker (2 wheels) Transfers: Sit to/from Stand Sit to Stand: Modified independent (Device/Increase time)           General transfer comment: pt safe with sit>stand with and without use of RW    Ambulation/Gait Ambulation/Gait assistance: Modified independent (Device/Increase time) Gait Distance  (Feet): 500 Feet Assistive device: Rolling walker (2 wheels), None Gait Pattern/deviations: Step-through pattern, Decreased stride length Gait Speed: Below normal General Gait Details: pt began with RW for half of ambulation and then no AD second half with no change in gait speed and no LOB with and without RW. Pt with very short steps. Gait speed appropriate for household and limited community mobility  Home Activity Instructions Home Activity Instructions: discussed activity level upon return home  Stairs            Modified Rankin (Stroke Patients Only)        Balance Overall balance assessment: Mild deficits observed, not formally tested Sitting-balance support: Feet supported, No upper extremity supported Sitting balance-Leahy Scale: Normal       Standing balance-Leahy Scale: Good Standing balance comment: stable with static stance, mild limitations noted with dynamic balance          Pertinent Vitals/Pain PT - Brief Vital Signs All Vital Signs Stable: Yes Pain Assessment Pain Assessment: No/denies pain     Home Living Family/patient expects to be discharged to:: Private residence Living Arrangements: Spouse/significant other Available Help at Discharge: Family;Available 24 hours/day Home Environment: Stairs to enter  [employer] of Steps: 4 Home Equipment: Agricultural consultant (2 wheels);Cane - single point;BSC/3in1;Shower seat   Additional Comments: spouse has R AKA but drives, is independent, just got his prosthesis    Prior Function Level of Independence: Independent Comments: activity limited by chronic back pain    UE/LE Assessment   UE ROM/Strength/Tone/Coordination: WFL    LE ROM/Strength/Tone/Coordination: [facility]      Communication   Communication Communication: No apparent difficulties  Cognition Overall Cognitive Status: Appears within functional limits for tasks assessed/performed (for basic conversation, mildly slow processing  baseline since CVA)       General Comments General comments (skin integrity, edema, etc.): VSS    Exercises     Assessment/Plan    PT Problem List         PT Visit Diagnosis Unsteadiness on feet (R26.81)    No Skilled PT All education completed;Patient will have necessary level of assist by caregiver at discharge;Patient is modified independent with all activity/mobility   Co-evaluation                AMPAC 6 Clicks Help needed turning from your back to your side while in a flat bed without using bedrails?: None Help needed moving from lying on your back to sitting on the side of a flat bed without using bedrails?: None Help needed moving to and from a bed to a chair (including a wheelchair)?: None Help needed standing up from a chair using your arms (e.g., wheelchair or bedside chair)?: None Help needed to walk in hospital room?: None Help needed climbing 3-5 steps with a railing? : None 6 Click Score: 24      End of Session   Activity Tolerance: Patient tolerated treatment well Patient left: in bed;with call bell/phone within reach Nurse Communication: Mobility status PT Visit Diagnosis: Unsteadiness on feet (R26.81)     Time: 8467-8447 PT Time Calculation (min) (ACUTE ONLY): 20 min  Charges:   PT Evaluation $PT Eval Low Complexity: 1 Low      Anita [name], PT  Acute Rehab Services Secure chat preferred Office [phone]   Anita [name]  02/07/2024, 4:02 PM

## 2024-02-07 NOTE — Plan of Care (Signed)

## 2024-02-07 NOTE — Evaluation (Signed)
 Occupational Therapy Evaluation Patient Details Name: Anita [name] MRN: [id number] DOB: [birth year] Today's Date: 02/07/2024   History of Present Illness   Pt is a 63 y/o F presenting to ED on 9/5 wtih abdominal pain, found to have obstructive uropathy with L urethral stone. S/p cytoscopy with L urethral stent placement. PMH includes DM2, depression, CAD, anxiety, CVA     Clinical Impressions Pt ind with ADLs and functional mobility, spouse assists with med mgmt and driving. Pt currently needs supervision overall for ADLs, supervision for transfers with and without AD. Pt close to baseline, reports having baseline memory deficits from prior CVA. Pt presenting with impairments listed below, however has no further acute OT needs at this time, will s/o. Please reconsult if there is a change in pt status. Anticipate no OT follow up needs at d/c.     If plan is discharge home, recommend the following:   A little help with bathing/dressing/bathroom;Assist for transportation;Supervision due to cognitive status     Functional Status Assessment   Patient has had a recent decline in their functional status and demonstrates the ability to make significant improvements in function in a reasonable and predictable amount of time.     Equipment Recommendations   None recommended by OT     Recommendations for Other Services         Precautions/Restrictions   Precautions Precautions: None Restrictions Weight Bearing Restrictions Per Provider Order: No     Mobility Bed Mobility               General bed mobility comments: OOB in chair upon arrival & departure    Transfers Overall transfer level: Needs assistance Equipment used: None Transfers: Sit to/from Stand Sit to Stand: Supervision                  Balance Overall balance assessment: Mild deficits observed, not formally tested                                         ADL either performed  or assessed with clinical judgement   ADL Overall ADL's : Needs assistance/impaired Eating/Feeding: Independent   Grooming: Supervision/safety   Upper Body Bathing: Supervision/ safety   Lower Body Bathing: Supervison/ safety   Upper Body Dressing : Supervision/safety   Lower Body Dressing: Supervision/safety   Toilet Transfer: Supervision/safety   Toileting- Clothing Manipulation and Hygiene: Supervision/safety       Functional mobility during ADLs: Supervision/safety       Vision   Vision Assessment?: No apparent visual deficits     Perception Perception: Not tested       Praxis Praxis: Not tested       Pertinent Vitals/Pain Pain Assessment Pain Assessment: No/denies pain     Extremity/Trunk Assessment Upper Extremity Assessment Upper Extremity Assessment: Generalized weakness   Lower Extremity Assessment Lower Extremity Assessment: Defer to PT evaluation   Cervical / Trunk Assessment Cervical / Trunk Assessment: Normal   Communication Communication Communication: No apparent difficulties   Cognition Arousal: Alert Behavior During Therapy: WFL for tasks assessed/performed Cognition: History of cognitive impairments             OT - Cognition Comments: hx of memory deficits from CVA, unaware of why she was at the hospital but then able to recall with cues  Following commands: Intact       Cueing  General Comments   Cueing Techniques: Gestural cues;Verbal cues  VSS   Exercises     Shoulder Instructions      Home Living Family/patient expects to be discharged to:: Private residence Living Arrangements: Spouse/significant other Available Help at Discharge: Family;Available 24 hours/day Type of Home: Mobile home Home Access: Stairs to enter Entrance Stairs-Number of Steps: 4 Entrance Stairs-Rails: Right;Left;Can reach both Home Layout: One level     Bathroom Shower/Tub: Chief Strategy Officer:  Standard     Home Equipment: Agricultural consultant (2 wheels);Cane - single point;BSC/3in1;Shower seat   Additional Comments: spouse has leg amputation      Prior Functioning/Environment Prior Level of Function : Independent/Modified Independent             Mobility Comments: ind, no AD ADLs Comments: does not drive, spouse assists with shower transfer and managing meds    OT Problem List: Decreased strength;Decreased activity tolerance;Decreased range of motion;Impaired balance (sitting and/or standing)   OT Treatment/Interventions:        OT Goals(Current goals can be found in the care plan section)   Acute Rehab OT Goals Patient Stated Goal: none stated OT Goal Formulation: With patient Time For Goal Achievement: 02/21/24 Potential to Achieve Goals: Good   OT Frequency:       Co-evaluation              AM-PAC OT 6 Clicks Daily Activity     Outcome Measure Help from another person eating meals?: None Help from another person taking care of personal grooming?: None Help from another person toileting, which includes using toliet, bedpan, or urinal?: None Help from another person bathing (including washing, rinsing, drying)?: A Little Help from another person to put on and taking off regular upper body clothing?: None Help from another person to put on and taking off regular lower body clothing?: A Little 6 Click Score: 22   End of Session Equipment Utilized During Treatment: Gait belt Nurse Communication: Mobility status  Activity Tolerance: Patient tolerated treatment well Patient left: with call bell/phone within reach;in chair  OT Visit Diagnosis: Muscle weakness (generalized) (M62.81)                Time: 9074-9058 OT Time Calculation (min): 16 min Charges:  OT General Charges $OT Visit: 1 Visit OT Treatments $Self Care/Home Management : 8-22 mins  Boleslaw Borghi K, OTD, OTR/L SecureChat Preferred Acute Rehab [phone]   Maleigha Colvard K  K[place]ce 02/07/2024, 10:14 AM

## 2024-02-07 NOTE — Progress Notes (Signed)
 [facility] Progress Note Patient: Anita [name] FMW:[id number] DOB: Sep 10, 1960  DOA: 02/03/2024 DOS: the patient was seen and examined on 02/07/2024  Brief Hospital Course: PMH of type II DM, depression, CAD, anxiety, HLD, CVA presents to the hospital with complaints of abdominal pain found to have obstructive uropathy with left ureteral stone. Brought to [facility].  Underwent cystoscopy and stent placement. Blood cultures positive for Klebsiella. Monitor overnight for stability of the CBGs  Assessment and Plan: Sepsis secondary to complicated urinary tract infection, present on admission due to Klebsiella Patient presented with fever 100.8, tachycardia 106 as well as leukocytosis Blood cultures came back positive for Klebsiella. On ceftriaxone  2 mg in the hospital. Upon discharge could be a candidate for ciprofloxacin  therapy. Follow-up on culture results   Nephrolithiasis s/p ureteral stent placement Patient underwent ureteral stent placement by urologist on 02/03/2024 Will require definitive stone management outpatient ureteroscopy   Acute kidney injury secondary to obstructive uropathy in the setting of ureteral stone as well as possible underlying dehydration Metabolic acidosis Treated with IV fluid Baseline serum creatinine 1.0.  On admission serum creatinine 2.71.  Improved with IV hydration and worsening again. Received IV fluid. Foley catheter removed.  Hyponatremia secondary to volume depletion Treated with IV fluid   Type II diabetes mellitus without long-term insulin  use Oral hypoglycemic agents at home. CBG significantly elevated for some reason on 9/9 at 410. After treatment with insulin  and CBG reduced to 40. BMP correlates with CBG after this. Will monitor overnight for stability. Continue sliding scale   Depression Anxiety disorder Continue Xanax  and buspirone    Coronary artery disease Continue aspirin  and statin therapy   Hyperlipidemia Continue statin  therapy   Previous strokes Continue aspirin  and statin therapy   Chronic pain medication use. Chronically on oxycodone  15 mg 4 times daily as needed as well as gabapentin  600 mg 3 times daily. Also on Zanaflex  8 mg 4 times daily. Currently dose adjusted according to renal function.   Subjective: No nausea no vomiting no fever chills.  Had a BM.  Passing gas.  Physical Exam: Clear to auscultation  bowel sound present No edema.  Data Reviewed: I have Reviewed nursing notes, Vitals, and Lab results. Since last encounter, pertinent lab results CBC and BMP   . I have ordered test including CBC and BMP  .   Disposition: Status is: Inpatient Remains inpatient appropriate because: Monitor for improvement in CBG  heparin  injection 5,000 Units Start: 02/04/24 0915 SCDs Start: 02/04/24 0007   Family Communication: No one at bedside Level of care: Med-Surg   Vitals:   02/07/24 0431 02/07/24 0841 02/07/24 0945 02/07/24 1855  BP: (!) 160/70 (!) 182/67 (!) 182/67 (!) 182/67  Pulse: (!) 59 63 63   Resp: 17     Temp: 98 F (36.7 C) 97.8 F (36.6 C)    TempSrc: Oral Oral    SpO2: 99% 100%    Weight:      Height:         Author: Yetta Blanch, MD 02/07/2024 7:29 PM  Please look on [email or web address] to find out who is on call.

## 2024-02-07 NOTE — Progress Notes (Signed)
   02/07/24 1615  TOC Brief Assessment  Insurance and Status Reviewed  Patient has primary care physician Yes  Home environment has been reviewed spouse  Prior level of function: independent  Prior/Current Home Services No current home services  Social Drivers of Health Review SDOH reviewed no interventions necessary  Readmission risk has been reviewed Yes  Transition of care needs no transition of care needs at this time     No PT/OT follow up no DME recommendations     Transition of Care Department (TOC) has reviewed patient and no TOC needs have been identified at this time. We will continue to monitor patient advancement through interdisciplinary progression rounds. If new patient transition needs arise, please place a TOC consult.

## 2024-02-07 NOTE — Care Management Important Message (Signed)
 Important Message  Patient Details  Name: TIMA CURET MRN: [id number] Date of Birth: December 31, 1960   Important Message Given:  Yes - Medicare IM     Jon Cruel 02/07/2024, 4:26 PM

## 2024-02-08 ENCOUNTER — Other Ambulatory Visit (HOSPITAL_COMMUNITY): Payer: Self-pay

## 2024-02-08 DIAGNOSIS — N179 Acute kidney failure, unspecified: Secondary | ICD-10-CM | POA: Diagnosis not present

## 2024-02-08 DIAGNOSIS — R6520 Severe sepsis without septic shock: Secondary | ICD-10-CM | POA: Diagnosis not present

## 2024-02-08 DIAGNOSIS — A4151 Sepsis due to Escherichia coli [E. coli]: Secondary | ICD-10-CM | POA: Diagnosis not present

## 2024-02-08 LAB — BASIC METABOLIC PANEL WITH GFR
Anion gap: 11 (ref 5–15)
BUN: 27 mg/dL — ABNORMAL HIGH (ref 8–23)
CO2: 22 mmol/L (ref 22–32)
Calcium: 8.5 mg/dL — ABNORMAL LOW (ref 8.9–10.3)
Chloride: 105 mmol/L (ref 98–111)
Creatinine, Ser: 1.33 mg/dL — ABNORMAL HIGH (ref 0.44–1.00)
GFR, Estimated: 45 mL/min — ABNORMAL LOW (ref >60)
Glucose, Bld: 154 mg/dL — ABNORMAL HIGH (ref 70–99)
Potassium: 4.1 mmol/L (ref 3.5–5.1)
Sodium: 138 mmol/L (ref 135–145)

## 2024-02-08 LAB — CBC
HCT: 35.8 % — ABNORMAL LOW (ref 36.0–46.0)
Hemoglobin: 11.5 g/dL — ABNORMAL LOW (ref 12.0–15.0)
MCH: 27.8 pg (ref 26.0–34.0)
MCHC: 32.1 g/dL (ref 30.0–36.0)
MCV: 86.5 fL (ref 80.0–100.0)
Platelets: 181 K/uL (ref 150–400)
RBC: 4.14 MIL/uL (ref 3.87–5.11)
RDW: 14.8 % (ref 11.5–15.5)
WBC: 7.1 K/uL (ref 4.0–10.5)
nRBC: 0 % (ref 0.0–0.2)

## 2024-02-08 LAB — GLUCOSE, CAPILLARY: Glucose-Capillary: 154 mg/dL — ABNORMAL HIGH (ref 70–99)

## 2024-02-08 LAB — MAGNESIUM: Magnesium: 1.9 mg/dL (ref 1.7–2.4)

## 2024-02-08 MED ORDER — OXYCODONE HCL 15 MG PO TABS
15.0000 mg | ORAL_TABLET | Freq: Three times a day (TID) | ORAL | 0 refills | Status: AC | PRN
  Filled 2024-02-08: qty 16, 6d supply, fill #0

## 2024-02-08 MED ORDER — ATORVASTATIN CALCIUM 20 MG PO TABS
20.0000 mg | ORAL_TABLET | Freq: Every day | ORAL | 0 refills | Status: AC
  Filled 2024-02-08: qty 90, 90d supply, fill #0

## 2024-02-08 MED ORDER — ISOSORBIDE MONONITRATE ER 30 MG PO TB24
30.0000 mg | ORAL_TABLET | Freq: Every day | ORAL | 0 refills | Status: AC
  Filled 2024-02-08: qty 90, 90d supply, fill #0

## 2024-02-08 MED ORDER — ALPRAZOLAM 0.5 MG PO TABS
0.2500 mg | ORAL_TABLET | Freq: Four times a day (QID) | ORAL | 0 refills | Status: AC
  Filled 2024-02-08: qty 10, 5d supply, fill #0

## 2024-02-08 MED ORDER — CIPROFLOXACIN HCL 500 MG PO TABS
500.0000 mg | ORAL_TABLET | Freq: Two times a day (BID) | ORAL | 0 refills | Status: AC
Start: 2024-02-08 — End: 2024-02-15
  Filled 2024-02-08: qty 14, 7d supply, fill #0

## 2024-02-08 MED ORDER — AMLODIPINE BESYLATE 5 MG PO TABS
5.0000 mg | ORAL_TABLET | Freq: Every day | ORAL | 0 refills | Status: DC
  Filled 2024-02-08: qty 90, 90d supply, fill #0

## 2024-02-08 MED ORDER — TIZANIDINE HCL 4 MG PO TABS
8.0000 mg | ORAL_TABLET | Freq: Four times a day (QID) | ORAL | 0 refills | Status: AC | PRN
  Filled 2024-02-08: qty 20, 3d supply, fill #0

## 2024-02-08 MED ORDER — GABAPENTIN 600 MG PO TABS
600.0000 mg | ORAL_TABLET | Freq: Three times a day (TID) | ORAL | 0 refills | Status: AC
  Filled 2024-02-08: qty 270, 90d supply, fill #0

## 2024-02-08 MED ORDER — METFORMIN HCL 500 MG PO TABS
500.0000 mg | ORAL_TABLET | Freq: Two times a day (BID) | ORAL | 0 refills | Status: DC
  Filled 2024-02-08: qty 180, 90d supply, fill #0

## 2024-02-08 MED ORDER — PROMETHAZINE HCL 25 MG PO TABS
25.0000 mg | ORAL_TABLET | Freq: Three times a day (TID) | ORAL | 0 refills | Status: AC | PRN
  Filled 2024-02-08: qty 20, 7d supply, fill #0

## 2024-02-08 MED ORDER — GLIPIZIDE 5 MG PO TABS
5.0000 mg | ORAL_TABLET | Freq: Three times a day (TID) | ORAL | 0 refills | Status: AC
  Filled 2024-02-08: qty 270, 90d supply, fill #0

## 2024-02-08 MED ORDER — METOPROLOL TARTRATE 100 MG PO TABS
100.0000 mg | ORAL_TABLET | Freq: Two times a day (BID) | ORAL | 0 refills | Status: AC
  Filled 2024-02-08: qty 180, 90d supply, fill #0

## 2024-02-08 NOTE — Progress Notes (Signed)
 Discharge instructions given to pt. Pt verbalized understanding of all teaching and had no further questions

## 2024-02-08 NOTE — Progress Notes (Signed)
 Discharge meds given to pt at discharge Tizanidine  4mg  Ciprofloxin 500mg  Isosorbide  mononitrate 30mg  Promethazine  25mg  Amlodipine  5mg  Glipizide  5mg  Metformin  500mg 

## 2024-02-08 NOTE — Discharge Summary (Signed)
 Physician Discharge Summary  Anita [name] FMW:[id number] DOB: 05-14-1961 DOA: 02/03/2024  PCP: Bertell Satterfield, MD  Admit date: 02/03/2024 Discharge date: 02/08/2024  Admitted From: home Disposition:  home  Recommendations for Outpatient Follow-up:  Follow up with PCP in 1-2 weeks Please obtain BMP/CBC in one week Follow-up with urology as an outpatient  Home Health: none Equipment/Devices: none  Discharge Condition: stable CODE STATUS: Full code Diet Orders (From admission, onward)     Start     Ordered   02/07/24 1139  Diet Carb Modified Fluid consistency: Thin; Room service appropriate? Yes  Diet effective now       Question Answer Comment  Diet-HS Snack? Nothing   Calorie Level Medium 1600-2000   Fluid consistency: Thin   Room service appropriate? Yes      02/07/24 1138           Brief Hospital Course: PMH of type II DM, depression, CAD, anxiety, HLD, CVA presents to the hospital with complaints of abdominal pain found to have obstructive uropathy with left ureteral stone. Brought to [facility].  Underwent cystoscopy and stent placement. Blood cultures positive for Klebsiella.  Hospital Course / Discharge diagnoses: Principal problem Sepsis secondary to complicated urinary tract infection, present on admission due to Klebsiella - Patient presented with fever 100.8, tachycardia 106 as well as leukocytosis. Blood cultures came back positive for Klebsiella.  She was maintained on IV antibiotics while hospitalized, improved, now afebrile, will be transition to ciprofloxacin  based on sensitivities  Active problems Nephrolithiasis s/p ureteral stent placement - Patient underwent ureteral stent placement by urologist on 02/03/2024. Will require definitive stone management outpatient ureteroscopy Acute kidney injury secondary to obstructive uropathy in the setting of ureteral stone as well as possible underlying dehydration Metabolic acidosis - Treated with IV fluid, improved.   Hyponatremia secondary to volume depletion -sodium normalized after fluids Type II diabetes mellitus without long-term insulin  use -Continue home regimen Depression Anxiety disorder - Continue Xanax  and buspirone  Coronary artery disease - Continue aspirin  and statin therapy Hyperlipidemia - Continue statin therapy Previous strokes - Continue aspirin  and statin therapy  Chronic pain medication use -short-term refill done as she is in between PCPs  Discharge Instructions   Allergies as of 02/08/2024       Reactions   Altace  [ramipril ] Swelling   Possible ACE-I induced angioedema    Imitrex [sumatriptan] Swelling   Swelling in neck   Lyrica [pregabalin] Other (See Comments)   Lower extremity swelling   Penicillins Rash        Medication List     STOP taking these medications    sulfamethoxazole-trimethoprim 400-80 MG tablet Commonly known as: BACTRIM       TAKE these medications    acetaminophen  325 MG tablet Commonly known as: TYLENOL  Take 2 tablets (650 mg total) by mouth every 6 (six) hours as needed for mild pain (pain score 1-3) (or Fever >/= 101).   ALPRAZolam  0.5 MG tablet Commonly known as: XANAX  Take 0.5 tablets (0.25 mg total) by mouth in the morning, at noon, in the evening, and at bedtime.   amLODipine  5 MG tablet Commonly known as: NORVASC  Take 1 tablet (5 mg total) by mouth daily. Start taking on: February 09, 2024   aspirin  EC 81 MG tablet Take 81 mg by mouth daily. Swallow whole.   atorvastatin  20 MG tablet Commonly known as: LIPITOR Take 1 tablet (20 mg total) by mouth at bedtime.   ciprofloxacin  500 MG tablet Commonly known as: Cipro   Take 1 tablet (500 mg total) by mouth 2 (two) times daily for 7 days.   gabapentin  600 MG tablet Commonly known as: NEURONTIN  Take 1 tablet (600 mg total) by mouth 3 (three) times daily.   glipiZIDE  5 MG tablet Commonly known as: GLUCOTROL  Take 1 tablet (5 mg total) by mouth with breakfast, with  lunch, and with evening meal.   isosorbide  mononitrate 30 MG 24 hr tablet Commonly known as: IMDUR  Take 1 tablet (30 mg total) by mouth daily.   metFORMIN  500 MG tablet Commonly known as: GLUCOPHAGE  Take 1 tablet (500 mg total) by mouth 2 (two) times daily with a meal.   metoprolol  tartrate 100 MG tablet Commonly known as: LOPRESSOR  Take 1 tablet (100 mg total) by mouth 2 (two) times daily.   oxyCODONE  15 MG immediate release tablet Commonly known as: ROXICODONE  Take 1 tablet (15 mg total) by mouth every 8 (eight) hours as needed for pain. What changed:  when to take this reasons to take this   promethazine  25 MG tablet Commonly known as: PHENERGAN  Take 1 tablet (25 mg total) by mouth every 8 (eight) hours as needed for nausea or vomiting. What changed:  when to take this reasons to take this   tiZANidine  4 MG tablet Commonly known as: ZANAFLEX  Take 2 tablets (8 mg total) by mouth 4 (four) times daily as needed for muscle spasms.       Consultations: Urology   Procedures/Studies:  CT ABDOMEN PELVIS WO CONTRAST Result Date: 02/03/2024 EXAM: CT ABDOMEN AND PELVIS WITHOUT CONTRAST 02/03/2024 07:54:05 PM TECHNIQUE: CT of the abdomen and pelvis was performed without the administration of intravenous contrast. Multiplanar reformatted images are provided for review. Automated exposure control, iterative reconstruction, and/or weight-based adjustment of the mA/kV was utilized to reduce the radiation dose to as low as reasonably achievable. COMPARISON: Comparison with CT dated 05/03/22. CLINICAL HISTORY: Abdominal pain, acute, nonlocalized. Urinary frequency, gen abd pain. ; c/o urinary frequency and pain during urination x 3 days. Pt also c/o left side abd pain since yesterday. FINDINGS: LOWER CHEST: No acute abnormality. LIVER: The liver is unremarkable. GALLBLADDER AND BILE DUCTS: No evidence of cholecystitis. Layering sludge in the gallbladder. SPLEEN: No acute abnormality.  PANCREAS: No acute abnormality. ADRENAL GLANDS: No acute abnormality. KIDNEYS, URETERS AND BLADDER: Asymmetric left greater than right perinephric stranding. Mild left hydronephrosis upstream from a 9 mm stone in the proximal left ureter. Gas within the bladder. GI AND BOWEL: Stomach demonstrates no acute abnormality. There is no bowel obstruction. PERITONEUM AND RETROPERITONEUM: No free air. VASCULATURE: Aorta is normal in caliber. LYMPH NODES: No lymphadenopathy. REPRODUCTIVE ORGANS: No acute abnormality. BONES AND SOFT TISSUES: No acute osseous abnormality. No focal soft tissue abnormality. IMPRESSION: 1. Mild left hydronephrosis upstream from a 9 mm stone in the proximal left ureter. 2. Asymmetric left greater than right perinephric stranding maybe due to obstruction or pyelonephritis . 3. Gas within the bladder correlate for recent instrumentation or cystitis . Electronically signed by: Norman Gatlin MD 02/03/2024 08:03 PM EDT RP Workstation: [id number]   DG Chest Port 1 View if patient is in a treatment room. Result Date: 02/03/2024 CLINICAL DATA:  Suspected sepsis EXAM: PORTABLE CHEST 1 VIEW COMPARISON:  03/18/2023 FINDINGS: Prior median sternotomy. Heart is borderline in size. No confluent airspace opacities or effusions. No edema. No acute bony abnormality. IMPRESSION: Borderline heart size.  No active disease. Electronically Signed   By: Franky Crease M.D.   On: 02/03/2024 18:10     Subjective: -  no chest pain, shortness of breath, no abdominal pain, nausea or vomiting.   Discharge Exam: BP (!) 176/66   Pulse 60   Temp 97.7 F (36.5 C) (Oral)   Resp 16   Ht 5' 5 (1.651 m)   Wt 68 kg   SpO2 98%   BMI 24.96 kg/m   General: Pt is alert, awake, not in acute distress Cardiovascular: RRR, S1/S2 +, no rubs, no gallops Respiratory: CTA bilaterally, no wheezing, no rhonchi Abdominal: Soft, NT, ND, bowel sounds + Extremities: no edema, no cyanosis    The results of significant  diagnostics from this hospitalization (including imaging, microbiology, ancillary and laboratory) are listed below for reference.     Microbiology: Recent Results (from the past 240 hours)  Urine Culture     Status: Abnormal   Collection Time: 02/03/24  5:47 PM   Specimen: Urine, Random  Result Value Ref Range Status   Specimen Description   Final    URINE, RANDOM Performed at [facility], [address] [place], [place] [address]    Special Requests   Final    NONE Reflexed from [id number] Performed at [facility], [address], [place], [place] [address]    Culture (A)  Final    <10,000 COLONIES/mL INSIGNIFICANT GROWTH Performed at [facility], [address], [place], [place] [address]    Report Status 02/05/2024 FINAL  Final  Culture, blood (Routine x 2)     Status: Abnormal   Collection Time: 02/03/24  5:48 PM   Specimen: BLOOD  Result Value Ref Range Status   Specimen Description   Final    BLOOD BLOOD LEFT ARM Performed at [facility], [address], [place], [place] [address]    Special Requests   Final    BOTTLES DRAWN AEROBIC AND ANAEROBIC Blood Culture adequate volume Performed at [facility], [address] [place], [place] [address]    Culture  Setup Time   Final    GRAM NEGATIVE RODS ANAEROBIC BOTTLE ONLY Gram Stain Report Called to,Read Back By and Verified With: CALWITAN @ 0950 ON [id number] BY HENDERSON L CRITICAL VALUE NOTED.  VALUE IS CONSISTENT WITH PREVIOUSLY REPORTED AND CALLED VALUE.    Culture (A)  Final    KLEBSIELLA PNEUMONIAE SUSCEPTIBILITIES PERFORMED ON PREVIOUS CULTURE WITHIN THE LAST 5 DAYS. Performed at [facility], [address] [place], [place] [address]    Report Status 02/07/2024 FINAL  Final  Culture, blood (Routine x 2)     Status: Abnormal   Collection Time: 02/03/24  5:55 PM   Specimen: BLOOD  Result Value Ref Range Status   Specimen Description   Final    BLOOD BLOOD LEFT FOREARM Performed at [facility],  [address], [place], [place] [address]    Special Requests   Final    BOTTLES DRAWN AEROBIC AND ANAEROBIC Blood Culture adequate volume Performed at [facility], [address] [place], [place] [address]    Culture  Setup Time   Final    GRAM NEGATIVE RODS ANAEROBIC BOTTLE ONLY Gram Stain Report Called to,Read Back By and Verified With: CALVITAN @ 0905 ON [id number] BY HENDERSON L CRITICAL RESULT CALLED TO, READ BACK BY AND VERIFIED WITH: PHARMD JANE D ON [id number] @1244  BY SM Performed at [facility], [address], [place], [place] [address]    Culture KLEBSIELLA PNEUMONIAE (A)  Final   Report Status 02/07/2024 FINAL  Final   Organism ID, Bacteria KLEBSIELLA  PNEUMONIAE  Final      Susceptibility   Klebsiella pneumoniae - MIC*    AMPICILLIN >=32 RESISTANT Resistant     CEFAZOLIN (NON-URINE) 4 INTERMEDIATE Intermediate     CEFEPIME <=0.12 SENSITIVE Sensitive     ERTAPENEM <=0.12 SENSITIVE Sensitive     CEFTRIAXONE  <=0.25 SENSITIVE Sensitive     CIPROFLOXACIN  <=0.06 SENSITIVE Sensitive     GENTAMICIN  <=1 SENSITIVE Sensitive     MEROPENEM <=0.25 SENSITIVE Sensitive     TRIMETH/SULFA >=320 RESISTANT Resistant     AMPICILLIN/SULBACTAM <=2 SENSITIVE Sensitive     PIP/TAZO Value in next row Sensitive ug/mL     <=4 SENSITIVEThis is a modified FDA-approved test that has been validated and its performance characteristics determined by the reporting laboratory.  This laboratory is certified under the Clinical Laboratory Improvement Amendments CLIA as qualified to perform high complexity clinical laboratory testing.    * KLEBSIELLA PNEUMONIAE  Blood Culture ID Panel (Reflexed)     Status: Abnormal   Collection Time: 02/03/24  5:55 PM  Result Value Ref Range Status   Enterococcus faecalis NOT DETECTED NOT DETECTED Final   Enterococcus Faecium NOT DETECTED NOT DETECTED Final   Listeria monocytogenes NOT DETECTED NOT DETECTED Final   Staphylococcus species NOT DETECTED NOT DETECTED Final    Staphylococcus aureus (BCID) NOT DETECTED NOT DETECTED Final   Staphylococcus epidermidis NOT DETECTED NOT DETECTED Final   Staphylococcus lugdunensis NOT DETECTED NOT DETECTED Final   Streptococcus species NOT DETECTED NOT DETECTED Final   Streptococcus agalactiae NOT DETECTED NOT DETECTED Final   Streptococcus pneumoniae NOT DETECTED NOT DETECTED Final   Streptococcus pyogenes NOT DETECTED NOT DETECTED Final   A.calcoaceticus-baumannii NOT DETECTED NOT DETECTED Final   Bacteroides fragilis NOT DETECTED NOT DETECTED Final   Enterobacterales DETECTED (A) NOT DETECTED Final    Comment: Enterobacterales represent a large order of gram negative bacteria, not a single organism. CRITICAL RESULT CALLED TO, READ BACK BY AND VERIFIED WITH: PHARMD JANE D ON [id number] @1244  BY SM    Enterobacter cloacae complex NOT DETECTED NOT DETECTED Final   Escherichia coli NOT DETECTED NOT DETECTED Final   Klebsiella aerogenes NOT DETECTED NOT DETECTED Final   Klebsiella oxytoca NOT DETECTED NOT DETECTED Final   Klebsiella pneumoniae DETECTED (A) NOT DETECTED Final    Comment: CRITICAL RESULT CALLED TO, READ BACK BY AND VERIFIED WITH: PHARMD JANE D ON [id number] @1244  BY SM    Proteus species NOT DETECTED NOT DETECTED Final   Salmonella species NOT DETECTED NOT DETECTED Final   Serratia marcescens NOT DETECTED NOT DETECTED Final   Haemophilus influenzae NOT DETECTED NOT DETECTED Final   Neisseria meningitidis NOT DETECTED NOT DETECTED Final   Pseudomonas aeruginosa NOT DETECTED NOT DETECTED Final   Stenotrophomonas maltophilia NOT DETECTED NOT DETECTED Final   Candida albicans NOT DETECTED NOT DETECTED Final   Candida auris NOT DETECTED NOT DETECTED Final   Candida glabrata NOT DETECTED NOT DETECTED Final   Candida krusei NOT DETECTED NOT DETECTED Final   Candida parapsilosis NOT DETECTED NOT DETECTED Final   Candida tropicalis NOT DETECTED NOT DETECTED Final   Cryptococcus neoformans/gattii NOT DETECTED NOT  DETECTED Final   CTX-M ESBL NOT DETECTED NOT DETECTED Final   Carbapenem resistance IMP NOT DETECTED NOT DETECTED Final   Carbapenem resistance KPC NOT DETECTED NOT DETECTED Final   Carbapenem resistance NDM NOT DETECTED NOT DETECTED Final   Carbapenem resist OXA 48 LIKE NOT DETECTED NOT DETECTED Final   Carbapenem resistance VIM NOT DETECTED NOT  DETECTED Final    Comment: Performed at [facility], [address] [place], [place] [address]     Labs: Basic Metabolic Panel: Recent Labs  Lab 02/05/24 0902 02/06/24 0355 02/07/24 0306 02/07/24 1738 02/08/24 0259  NA 137 140 136 138 138  K 3.8 3.1* 4.6 4.0 4.1  CL 104 112* 100 105 105  CO2 19* 17* 20* 20* 22  GLUCOSE 170* 120* 137* 106* 154*  BUN 35* 26* 29* 29* 27*  CREATININE 1.73* 1.14* 1.42* 1.39* 1.33*  CALCIUM  8.7* 6.8* 8.7* 8.6* 8.5*  MG 1.8 1.3* 2.6*  --  1.9   Liver Function Tests: No results for input(s): AST, ALT, ALKPHOS, BILITOT, PROT, ALBUMIN in the last 168 hours. CBC: Recent Labs  Lab 02/04/24 0400 02/05/24 0902 02/06/24 0355 02/07/24 0306 02/08/24 0259  WBC 20.7* 11.3* 6.3 6.7 7.1  NEUTROABS 18.8*  --   --   --   --   HGB 10.5* 11.9* 9.2* 11.7* 11.5*  HCT 32.3* 36.8 28.9* 36.9 35.8*  MCV 88.5 87.6 87.8 87.6 86.5  PLT 120* 122* 117* 164 181   CBG: Recent Labs  Lab 02/07/24 1137 02/07/24 1655 02/07/24 1729 02/07/24 1957 02/08/24 0758  GLUCAP 410* 44* 107* 202* 154*   Hgb A1c No results for input(s): HGBA1C in the last 72 hours. Lipid Profile No results for input(s): CHOL, HDL, LDLCALC, TRIG, CHOLHDL, LDLDIRECT in the last 72 hours. Thyroid  function studies No results for input(s): TSH, T4TOTAL, T3FREE, THYROIDAB in the last 72 hours.  Invalid input(s): FREET3 Urinalysis    Component Value Date/Time   COLORURINE YELLOW 02/03/2024 1747   APPEARANCEUR CLEAR 02/03/2024 1747   LABSPEC 1.012 02/03/2024 1747   PHURINE 6.0 02/03/2024 1747   GLUCOSEU  NEGATIVE 02/03/2024 1747   HGBUR MODERATE (A) 02/03/2024 1747   BILIRUBINUR NEGATIVE 02/03/2024 1747   KETONESUR NEGATIVE 02/03/2024 1747   PROTEINUR 100 (A) 02/03/2024 1747   UROBILINOGEN 0.2 02/04/2015 1915   NITRITE NEGATIVE 02/03/2024 1747   LEUKOCYTESUR SMALL (A) 02/03/2024 1747    FURTHER DISCHARGE INSTRUCTIONS:   Get Medicines reviewed and adjusted: Please take all your medications with you for your next visit with your Primary MD   Laboratory/radiological data: Please request your Primary MD to go over all hospital tests and procedure/radiological results at the follow up, please ask your Primary MD to get all Hospital records sent to his/her office.   In some cases, they will be blood work, cultures and biopsy results pending at the time of your discharge. Please request that your primary care M.D. goes through all the records of your hospital data and follows up on these results.   Also Note the following: If you experience worsening of your admission symptoms, develop shortness of breath, life threatening emergency, suicidal or homicidal thoughts you must seek medical attention immediately by calling 911 or calling your MD immediately  if symptoms less severe.   You must read complete instructions/literature along with all the possible adverse reactions/side effects for all the Medicines you take and that have been prescribed to you. Take any new Medicines after you have completely understood and accpet all the possible adverse reactions/side effects.    Do not drive when taking Pain medications or sleeping medications (Benzodaizepines)   Do not take more than prescribed Pain, Sleep and Anxiety Medications. It is not advisable to combine anxiety,sleep and pain medications without talking with your primary care practitioner   Special Instructions: If you have smoked or chewed Tobacco  in the last 2 yrs please stop smoking, stop any regular Alcohol  and or any Recreational drug  use.   Wear Seat belts while driving.   Please note: You were cared for by a hospitalist during your hospital stay. Once you are discharged, your primary care physician will handle any further medical issues. Please note that NO REFILLS for any discharge medications will be authorized once you are discharged, as it is imperative that you return to your primary care physician (or establish a relationship with a primary care physician if you do not have one) for your post hospital discharge needs so that they can reassess your need for medications and monitor your lab values.  Time coordinating discharge: 35 minutes  SIGNED:  Nilda Fendt, MD, PhD 02/08/2024, 9:35 AM

## 2024-02-08 NOTE — Progress Notes (Signed)
 Mobility Specialist Progress Note:    02/08/24 1028  Mobility  Activity Ambulated with assistance (In hallway/ to BR)  Level of Assistance Modified independent, requires aide device or extra time  Assistive Device Front wheel walker  Distance Ambulated (ft) 270 ft  Activity Response Tolerated well  Mobility Referral Yes  Mobility visit 1 Mobility  Mobility Specialist Start Time (ACUTE ONLY) 1008  Mobility Specialist Stop Time (ACUTE ONLY) 1027  Mobility Specialist Time Calculation (min) (ACUTE ONLY) 19 min   Received pt in bed and agreeable to mobility. No physical assistance needed. No c/o. Returned to room without fault. Pt ambulated to the BR. Pt left in chair with personal belongings and call light within reach. All needs met.  Lavanda Pollack Mobility Specialist  Please contact via [employer] or  Rehab Office [phone]

## 2024-02-08 NOTE — Plan of Care (Signed)
  Problem: Clinical Measurements: Goal: Ability to maintain clinical measurements within normal limits will improve Outcome: Progressing   Problem: Activity: Goal: Risk for activity intolerance will decrease Outcome: Progressing   Problem: Nutrition: Goal: Adequate nutrition will be maintained Outcome: Progressing   Problem: Coping: Goal: Level of anxiety will decrease Outcome: Progressing   Problem: Pain Managment: Goal: General experience of comfort will improve and/or be controlled Outcome: Progressing

## 2024-02-10 ENCOUNTER — Telehealth: Payer: Self-pay | Admitting: Adult Health

## 2024-02-10 ENCOUNTER — Other Ambulatory Visit: Payer: Self-pay | Admitting: Urology

## 2024-02-10 NOTE — Telephone Encounter (Signed)
 Left message for the pt to call our office to schedule IN OFFICE Preop appt.

## 2024-02-10 NOTE — Telephone Encounter (Signed)
   Name: Anita [name]  DOB: [birth year]  MRN: [id number]  Primary Cardiologist: Debby Sor, MD (Inactive)  Chart reviewed as part of pre-operative protocol coverage. Because of Anita [name]'s past medical history and time since last visit, she will require a follow-up in-office visit in order to better assess preoperative cardiovascular risk.  Pre-op covering staff: - Please schedule appointment and call patient to inform them. If patient already had an upcoming appointment within acceptable timeframe, please add pre-op clearance to the appointment notes so provider is aware. - Please contact requesting surgeon's office via preferred method (i.e, phone, fax) to inform them of need for appointment prior to surgery.    Lamarr Satterfield, NP  02/10/2024, 9:30 AM

## 2024-02-10 NOTE — Telephone Encounter (Signed)
   [facility] Pre-operative Risk Assessment    Request for surgical clearance:  What type of surgery is being performed?  Left Ureteroscopy with Holmium Laser and Stent   When is this surgery scheduled?  02/27/24   What type of clearance is required (medical clearance vs. Pharmacy clearance to hold med vs. Both)?  Both   Are there any medications that need to be held prior to surgery and how long? Aspirin , 5 days prior   Practice name and name of physician performing surgery?  [facility]  Dr. Carolee   What is your office phone number? [phone] (ext#: [phone])   7.   What is your office fax number? [phone]  8.   Anesthesia type (None, local, MAC, general)? General   Anita [name] 02/10/2024, 9:23 AM

## 2024-02-14 NOTE — Telephone Encounter (Signed)
 2nd attempt, called patient no answer, left vm to call our office to schedule IN OFFICE Preop appt.

## 2024-02-15 NOTE — Telephone Encounter (Signed)
 3rd attempt : Called patient, NA, left message on VM.  [facility])

## 2024-02-17 NOTE — Telephone Encounter (Signed)
 Appointment scheduled for 02/20/2024 @11 :20pm with Floretta, MD
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
## 2024-02-20 ENCOUNTER — Encounter (HOSPITAL_COMMUNITY): Payer: Self-pay | Admitting: *Deleted

## 2024-02-20 ENCOUNTER — Encounter: Payer: Self-pay | Admitting: Student in an Organized Health Care Education/Training Program

## 2024-02-20 ENCOUNTER — Ambulatory Visit
Attending: Student in an Organized Health Care Education/Training Program | Admitting: Student in an Organized Health Care Education/Training Program

## 2024-02-20 VITALS — BP 142/68 | HR 63 | Ht 65.0 in | Wt 139.2 lb

## 2024-02-20 DIAGNOSIS — R072 Precordial pain: Secondary | ICD-10-CM

## 2024-02-20 DIAGNOSIS — E7800 Pure hypercholesterolemia, unspecified: Secondary | ICD-10-CM | POA: Diagnosis not present

## 2024-02-20 DIAGNOSIS — I480 Paroxysmal atrial fibrillation: Secondary | ICD-10-CM | POA: Diagnosis not present

## 2024-02-20 DIAGNOSIS — E785 Hyperlipidemia, unspecified: Secondary | ICD-10-CM

## 2024-02-20 DIAGNOSIS — R0609 Other forms of dyspnea: Secondary | ICD-10-CM | POA: Diagnosis not present

## 2024-02-20 DIAGNOSIS — I2510 Atherosclerotic heart disease of native coronary artery without angina pectoris: Secondary | ICD-10-CM | POA: Diagnosis not present

## 2024-02-20 DIAGNOSIS — I10 Essential (primary) hypertension: Secondary | ICD-10-CM

## 2024-02-20 NOTE — Assessment & Plan Note (Signed)
 Her blood pressure is a bit elevated today but she has reasons to have an elevated blood pressure outside of hypertension including ongoing infection, kidney stone pain, and reported anxiety.  I encouraged the patient to check her blood pressures at home and to keep a log so that I can review them next visit.  I think we will have a better idea what her blood pressures look like when she is farther removed from her active infection.  She is not currently taking amlodipine  due to her blood pressure  bottoming out.  No changes at this time.

## 2024-02-20 NOTE — Assessment & Plan Note (Signed)
 Has known nonobstructive CAD.  Ischemic evaluation as above but will continue aspirin  for now. -Continue aspirin  81 mg daily

## 2024-02-20 NOTE — Patient Instructions (Signed)
 Medication Instructions:  Your physician recommends that you continue on your current medications as directed. Please refer to the Current Medication list given to you today.  *If you need a refill on your cardiac medications before your next appointment, please call your pharmacy*  Lab Work: Your physician recommends that you have labs drawn today: Lipids  If you have labs (blood work) drawn today and your tests are completely normal, you will receive your results only by: MyChart Message (if you have MyChart) OR A paper copy in the mail If you have any lab test that is abnormal or we need to change your treatment, we will call you to review the results.  Testing/Procedures: Dr. Floretta has ordered a Lexiscan  Myocardial Perfusion Imaging Study.  Please arrive 15 minutes prior to your appointment time for registration and insurance purposes.   The test will take approximately 3 to 4 hours to complete; you may bring reading material.  If someone comes with you to your appointment, they will need to remain in the main lobby due to limited space in the testing area. **If you are pregnant or breastfeeding, please notify the nuclear lab prior to your appointment**   How to prepare for your Myocardial Perfusion Test: Do not eat or drink 3 hours prior to your test, except you may have water . Do not consume products containing caffeine  (regular or decaffeinated) 12 hours prior to your test. (ex: coffee, chocolate, sodas, tea). Do wear comfortable clothes (no dresses or overalls) and walking shoes, tennis shoes preferred (No heels or open toe shoes are allowed). Do NOT wear cologne, perfume, aftershave, or lotions (deodorant is allowed). If you use an inhaler, use it the AM of your test and bring it with you.  If you use a nebulizer, use it the AM of your test.  If these instructions are not followed, your test will have to be rescheduled.   Your physician has requested that you have an  echocardiogram. Echocardiography is a painless test that uses sound waves to create images of your heart. It provides your doctor with information about the size and shape of your heart and how well your heart's chambers and valves are working. This procedure takes approximately one hour. There are no restrictions for this procedure. Please do NOT wear cologne, perfume, aftershave, or lotions (deodorant is allowed). Please arrive 15 minutes prior to your appointment time.  Please note: We ask at that you not bring children with you during ultrasound (echo/ vascular) testing. Due to room size and safety concerns, children are not allowed in the ultrasound rooms during exams. Our front office staff cannot provide observation of children in our lobby area while testing is being conducted. An adult accompanying a patient to their appointment will only be allowed in the ultrasound room at the discretion of the ultrasound technician under special circumstances. We apologize for any inconvenience.   Follow-Up: At [facility], you and your health needs are our priority.  As part of our continuing mission to provide you with exceptional heart care, our providers are all part of one team.  This team includes your primary Cardiologist (physician) and Advanced Practice Providers or APPs (Physician Assistants and Nurse Practitioners) who all work together to provide you with the care you need, when you need it.  Your next appointment:   3 month(s)  Provider:   Georganna Floretta, MD    We recommend signing up for the patient portal called MyChart.  Sign up information is provided  on this After Visit Summary.  MyChart is used to connect with patients for Virtual Visits (Telemedicine).  Patients are able to view lab/test results, encounter notes, upcoming appointments, etc.  Non-urgent messages can be sent to your provider as well.   To learn more about what you can do with MyChart, go to  [email or web address].

## 2024-02-20 NOTE — Assessment & Plan Note (Signed)
 Lipid panel today

## 2024-02-21 ENCOUNTER — Telehealth (HOSPITAL_COMMUNITY): Payer: Self-pay | Admitting: *Deleted

## 2024-02-21 ENCOUNTER — Ambulatory Visit: Payer: Self-pay | Admitting: Student in an Organized Health Care Education/Training Program

## 2024-02-21 ENCOUNTER — Other Ambulatory Visit: Payer: Self-pay | Admitting: Student in an Organized Health Care Education/Training Program

## 2024-02-21 DIAGNOSIS — I2510 Atherosclerotic heart disease of native coronary artery without angina pectoris: Secondary | ICD-10-CM

## 2024-02-21 DIAGNOSIS — R0609 Other forms of dyspnea: Secondary | ICD-10-CM

## 2024-02-21 LAB — LIPID PANEL
Chol/HDL Ratio: 2.1 ratio (ref 0.0–4.4)
Cholesterol, Total: 76 mg/dL — ABNORMAL LOW (ref 100–199)
HDL: 36 mg/dL — ABNORMAL LOW (ref >39)
LDL Chol Calc (NIH): 17 mg/dL (ref 0–99)
Triglycerides: 130 mg/dL (ref 0–149)
VLDL Cholesterol Cal: 23 mg/dL (ref 5–40)

## 2024-02-21 NOTE — Progress Notes (Signed)
 Letter mailed

## 2024-02-21 NOTE — Telephone Encounter (Signed)
 Patient given detailed instructions per Myocardial Perfusion Study Information Sheet for the test on 02/23/24. Patient notified to arrive 15 minutes early and that it is imperative to arrive on time for appointment to keep from having the test rescheduled.  If you need to cancel or reschedule your appointment, please call the office within 24 hours of your appointment. . Patient verbalized understanding.Claudene Ronal Quale, RN

## 2024-02-22 NOTE — Progress Notes (Incomplete)
 The patient was identified using 2 approved identifiers. All issues noted in this document were discussed and addressed, [name]  voiced understanding and agreement with all preoperative instructions. The patient nor her husband, [name], have an E-mail address, so went over the surgery information and they wrote it down.    The patient was instructed to call our Admitting Office [phone] or [phone]) to complete their Pre-surgical Interview.    Medication Recon: verified meds are the same as stated on 02-04-24   COVID Vaccine received:  [x]  No []  Yes Date of any COVID positive Test in last 90 days:  none  PCP - [name], MD Cardiologist - [name], MD  PM&R- [name], MD   Chest x-ray - 02-03-2024  1v EKG - 02-20-2024  Stress Test - scheduled for 02-23-24 at 10:45 ECHO - 02-20-2024 Cardiac Cath - 02-15-2011  by Dr. Burnard CT Coronary Calcium  score:   Bowel Prep - []  No  []   Yes ______  Pacemaker / ICD device [x]  No []  Yes   Spinal Cord Stimulator:[x]  No []  Yes       History of Sleep Apnea? [x]  No []  Yes   CPAP used?- [x]  No []  Yes    Patient has: []  NO Hx DM   []  Pre-DM   []  DM1  [x]   DM2 Does the patient monitor blood sugar?   []  N/A   [x]  No []  Yes  Last A1c was: 6.4  on  02-04-24 while admitted at [facility]    METFORMIN - Patient aware to hold DOS GLIPIZIDE  - Patient aware to hold DOS  Blood Thinner / Instructions:  none Aspirin  Instructions:  ASA 81 mg - pt stopped today 02-22-24  Dental hx: []  Dentures:  []  N/A      []  Bridge or Partial:                   [x]  Loose or Damaged teeth:   Comments: Patient was admitted to [facility] on 9-5- to 02-08-24 for urosepsis. Labs, CXR, EKG done then will be used for her surgery 02-27-24  Activity level: Able to walk up 2 flights of stairs without becoming significantly short of breath or having chest pain?  [x]  No   []    Yes  Patient can perform ADLs without assistance. []  No   [x]   Yes  Anesthesia review: PAF,  nonobstructive CAD, HTN, DM2, s/p ASD ? repaired in 1989. Hx ? 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 NAFLD, Vapes, Thrombocytopenia,  woke up during Surgery  Patient denies any S&S of respiratory illness or Covid - no shortness of breath, fever, cough or chest pain at PAT appointment.  Patient verbalized understanding and agreement to the Pre-Surgical Instructions that were given to them at this PAT appointment. Patient was also educated of the need to review these PAT instructions again prior to her surgery.I reviewed the appropriate phone numbers to call if they have any and questions or concerns.

## 2024-02-22 NOTE — Patient Instructions (Signed)
 SURGICAL WAITING ROOM VISITATION Patients having surgery or a procedure may have no more than 2 support people in the waiting area - these visitors may rotate in the visitor waiting room.   If the patient needs to stay at the hospital during part of their recovery, the visitor guidelines for inpatient rooms apply.  PRE-OP VISITATION  Pre-op nurse will coordinate an appropriate time for 1 support person to accompany the patient in pre-op.  This support person may not rotate.  This visitor will be contacted when the time is appropriate for the visitor to come back in the pre-op area.  Please refer to the [facility] website for the visitor guidelines for Inpatients (after your surgery is over and you are in a regular room).  You are not required to quarantine at this time prior to your surgery. However, you must do this: Hand Hygiene often Do NOT share personal items Notify your provider if you are in close contact with someone who has COVID or you develop fever 100.4 or greater, new onset of sneezing, cough, sore throat, shortness of breath or body aches.  If you test positive for Covid or have been in contact with anyone that has tested positive in the last 10 days please notify you surgeon.    Your procedure is scheduled on:  Monday  02-27-24  Report to [facility] Main Entrance: [address] entrance where the [employer] is available.   Report to admitting at:  10:00   AM  Call this number if you have any questions or problems the morning of surgery [phone]  DO NOT EAT OR DRINK ANYTHING AFTER MIDNIGHT THE NIGHT PRIOR TO YOUR SURGERY / PROCEDURE.   FOLLOW  ANY ADDITIONAL PRE OP INSTRUCTIONS YOU RECEIVED FROM YOUR SURGEON'S OFFICE!!!   Oral Hygiene is also important to reduce your risk of infection.        Remember - BRUSH YOUR TEETH THE MORNING OF SURGERY WITH YOUR REGULAR TOOTHPASTE  Do NOT smoke after Midnight the night before surgery.  STOP TAKING all Vitamins, Herbs  and supplements 1 week before your surgery.   ASPIRIN - Stop taking  5 days BEFORE your surgery.  Last dose will be taken on 02-21-24  METFORMIN  and GLIPIZIDE -  Day before surgery, take as usual.  DO NOT TAKE METFORMIN  OR GLIPIZIDE  the morning of your surgery.   Take ONLY these medicines the morning of surgery with A SIP OF WATER : Metoprolol , gabapentin , Isosorbide  and Alprazolam  if needed. You may take EITHER Tylenol  OR Oxycodone  if needed for pain.                  You may not have any metal on your body including hair pins, jewelry, and body piercing  Do not wear make-up, lotions, powders, perfumes  or deodorant  Do not wear nail polish including gel and S&S, artificial / acrylic nails, or any other type of covering on natural nails including finger and toenails. If you have artificial nails, gel coating, etc., that needs to be removed by a nail salon, Please have this removed prior to surgery. Not doing so may mean that your surgery could be cancelled or delayed if the Surgeon or anesthesia staff feels like they are unable to monitor you safely.   Do not shave 48 hours prior to surgery to avoid nicks in your skin which may contribute to postoperative infections.   Contacts, Hearing Aids, dentures or bridgework may not be worn into surgery. DENTURES WILL BE REMOVED  PRIOR TO SURGERY PLEASE DO NOT APPLY Poly grip OR ADHESIVES!!!   Patients discharged on the day of surgery will not be allowed to drive home.  Someone NEEDS to stay with you for the first 24 hours after anesthesia.  Do not bring your home medications to the hospital. The Pharmacy will dispense medications listed on your medication list to you during your admission in the Hospital.  Please read over the following fact sheets you were given: IF YOU HAVE QUESTIONS ABOUT YOUR PRE-OP INSTRUCTIONS, PLEASE CALL [phone].  [facility] - Preparing for Surgery Before surgery, you can play an important role.  Because skin is not  sterile, your skin needs to be as free of germs as possible.  You can reduce the number of germs on your skin by washing with Antibacterial soap before surgery.  . Do not shave (including legs and underarms) for at least 48 hours prior to the first shower.  You may shave your face/neck.  Please follow these instructions carefully:  1.  Shower with antibacterial Soap the night before surgery and the  morning of surgery.  2.  If you choose to wash your hair, wash your hair first as usual with your normal  shampoo.  3.  After you shampoo, rinse your hair and body thoroughly to remove the shampoo.                             4.  You can apply soap directly to the skin and wash.  Gently with a scrungie or clean washcloth.  5.  Wash face,  Genitals (private parts) with your normal soap.             6.  Wash thoroughly, paying special attention to the area where your  surgery  will be performed.  7.  Thoroughly rinse your body with warm water  from the neck down.  8.   Pat yourself dry with a clean towel.             9  Wear clean pajamas.            10 Place clean sheets on your bed the night of your first shower and do not  sleep with pets.  ON THE DAY OF SURGERY : Do not apply any lotions/deodorants the morning of surgery.  Please wear clean clothes to the hospital/surgery center.  FAILURE TO FOLLOW THESE INSTRUCTIONS MAY RESULT IN THE CANCELLATION OF YOUR SURGERY  PATIENT SIGNATURE_________________________________  NURSE SIGNATURE__________________________________

## 2024-02-23 ENCOUNTER — Encounter (HOSPITAL_COMMUNITY): Admission: RE | Admit: 2024-02-23 | Source: Ambulatory Visit

## 2024-02-23 ENCOUNTER — Encounter (HOSPITAL_COMMUNITY): Payer: Self-pay

## 2024-02-23 ENCOUNTER — Ambulatory Visit (HOSPITAL_COMMUNITY)
Admission: RE | Admit: 2024-02-23 | Discharge: 2024-02-23 | Disposition: A | Discharge status: Home / Self Care | Source: Ambulatory Visit | Attending: Cardiology | Admitting: Cardiology

## 2024-02-23 ENCOUNTER — Encounter (HOSPITAL_COMMUNITY)
Admission: RE | Admit: 2024-02-23 | Discharge: 2024-02-23 | Disposition: A | Discharge status: Home / Self Care | Source: Ambulatory Visit | Attending: Urology | Admitting: Urology

## 2024-02-23 VITALS — Ht 65.0 in | Wt 136.7 lb

## 2024-02-23 DIAGNOSIS — E1140 Type 2 diabetes mellitus with diabetic neuropathy, unspecified: Secondary | ICD-10-CM

## 2024-02-23 DIAGNOSIS — R0609 Other forms of dyspnea: Secondary | ICD-10-CM | POA: Diagnosis not present

## 2024-02-23 DIAGNOSIS — Z01818 Encounter for other preprocedural examination: Secondary | ICD-10-CM

## 2024-02-23 DIAGNOSIS — I2510 Atherosclerotic heart disease of native coronary artery without angina pectoris: Secondary | ICD-10-CM | POA: Insufficient documentation

## 2024-02-23 DIAGNOSIS — R7989 Other specified abnormal findings of blood chemistry: Secondary | ICD-10-CM

## 2024-02-23 DIAGNOSIS — Z79891 Long term (current) use of opiate analgesic: Secondary | ICD-10-CM

## 2024-02-23 LAB — MYOCARDIAL PERFUSION IMAGING
LV dias vol: 96 mL (ref 46–106)
LV sys vol: 33 mL (ref 3.8–5.2)
Nuc Stress EF: 66 %
Peak HR: 87 {beats}/min
Rest HR: 62 {beats}/min
Rest Nuclear Isotope Dose: 9.6 mCi
SDS: 0
SRS: 2
SSS: 1
ST Depression (mm): 0 mm
Stress Nuclear Isotope Dose: 31.7 mCi
TID: 1

## 2024-02-23 MED ORDER — REGADENOSON 0.4 MG/5ML IV SOLN
INTRAVENOUS | Status: AC
Start: 2024-02-23 — End: 2024-02-23
  Filled 2024-02-23: qty 5

## 2024-02-23 MED ORDER — TECHNETIUM TC 99M TETROFOSMIN IV KIT
9.6000 | PACK | Freq: Once | INTRAVENOUS | Status: AC | PRN
  Administered 2024-02-23: 9.6 via INTRAVENOUS
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
## 2024-02-24 NOTE — Anesthesia Preprocedure Evaluation (Addendum)
 Anesthesia Evaluation  Patient identified by MRN, date of birth, ID band Patient awake    Reviewed: Allergy & Precautions, NPO status , Patient's Chart, lab work & pertinent test results, reviewed documented beta blocker date and time   History of Anesthesia Complications Negative for: history of anesthetic complications  Airway Mallampati: II       Dental  (+) Poor Dentition, Missing, Dental Advisory Given   Pulmonary Patient abstained from smoking., former smoker   breath sounds clear to auscultation       Cardiovascular hypertension, (-) angina + CAD and + Peripheral Vascular Disease  + dysrhythmias (RBBB)  Rhythm:Regular Rate:Normal  Myocardial Perfusion (01/2024):   The study is normal. The study is low risk.   No ST deviation was noted.   LV perfusion is normal. There is no evidence of ischemia. There is no evidence of infarction.   Left ventricular function is normal. Nuclear stress EF: 66%. The left ventricular ejection fraction is hyperdynamic (>65%). End diastolic cavity size is normal. End systolic cavity size is normal.   CT images were obtained for attenuation correction and were examined for the presence of coronary calcium  when appropriate.   Coronary calcium  was present on the attenuation correction CT images. Mild coronary calcifications were present. Coronary calcifications were present in the left anterior descending artery, left circumflex artery and right coronary artery distribution(s).   Prior study available for comparison from 09/29/2012.    Neuro/Psych CVA    GI/Hepatic   Endo/Other  diabetes, Type 2    Renal/GU Renal InsufficiencyRenal disease     Musculoskeletal   Abdominal   Peds  Hematology   Anesthesia Other Findings   Reproductive/Obstetrics                              Anesthesia Physical Anesthesia Plan  ASA: 2  Anesthesia Plan: General   Post-op  Pain Management:    Induction: Intravenous  PONV Risk Score and Plan: 1  Airway Management Planned: LMA  Additional Equipment:   Intra-op Plan:   Post-operative Plan: Extubation in OR  Informed Consent:      Dental advisory given  Plan Discussed with: CRNA and Surgeon  Anesthesia Plan Comments: (See PAT note 02/23/2024)         Anesthesia Quick Evaluation

## 2024-02-27 ENCOUNTER — Ambulatory Visit (HOSPITAL_COMMUNITY): Payer: Self-pay | Admitting: Physician Assistant

## 2024-02-27 ENCOUNTER — Encounter (HOSPITAL_COMMUNITY): Payer: Self-pay | Admitting: Urology

## 2024-02-27 ENCOUNTER — Other Ambulatory Visit: Payer: Self-pay

## 2024-02-27 ENCOUNTER — Encounter (HOSPITAL_COMMUNITY)
Admission: RE | Disposition: A | Discharge status: Home / Self Care | Payer: Self-pay | Source: Ambulatory Visit | Attending: Urology

## 2024-02-27 ENCOUNTER — Ambulatory Visit (HOSPITAL_COMMUNITY)
Admission: RE | Admit: 2024-02-27 | Discharge: 2024-02-27 | Disposition: A | Discharge status: Home / Self Care | Source: Ambulatory Visit | Attending: Urology | Admitting: Urology

## 2024-02-27 ENCOUNTER — Ambulatory Visit (HOSPITAL_BASED_OUTPATIENT_CLINIC_OR_DEPARTMENT_OTHER): Admitting: Anesthesiology

## 2024-02-27 ENCOUNTER — Ambulatory Visit (HOSPITAL_COMMUNITY)

## 2024-02-27 ENCOUNTER — Encounter (HOSPITAL_COMMUNITY)

## 2024-02-27 DIAGNOSIS — I10 Essential (primary) hypertension: Secondary | ICD-10-CM | POA: Insufficient documentation

## 2024-02-27 DIAGNOSIS — Z466 Encounter for fitting and adjustment of urinary device: Secondary | ICD-10-CM

## 2024-02-27 DIAGNOSIS — E1151 Type 2 diabetes mellitus with diabetic peripheral angiopathy without gangrene: Secondary | ICD-10-CM | POA: Insufficient documentation

## 2024-02-27 DIAGNOSIS — E1140 Type 2 diabetes mellitus with diabetic neuropathy, unspecified: Secondary | ICD-10-CM

## 2024-02-27 DIAGNOSIS — Z833 Family history of diabetes mellitus: Secondary | ICD-10-CM | POA: Diagnosis not present

## 2024-02-27 DIAGNOSIS — N201 Calculus of ureter: Secondary | ICD-10-CM

## 2024-02-27 DIAGNOSIS — Z01818 Encounter for other preprocedural examination: Secondary | ICD-10-CM

## 2024-02-27 DIAGNOSIS — Z87442 Personal history of urinary calculi: Secondary | ICD-10-CM | POA: Insufficient documentation

## 2024-02-27 DIAGNOSIS — I2510 Atherosclerotic heart disease of native coronary artery without angina pectoris: Secondary | ICD-10-CM | POA: Insufficient documentation

## 2024-02-27 DIAGNOSIS — Z87891 Personal history of nicotine dependence: Secondary | ICD-10-CM | POA: Diagnosis not present

## 2024-02-27 DIAGNOSIS — I4510 Unspecified right bundle-branch block: Secondary | ICD-10-CM | POA: Insufficient documentation

## 2024-02-27 LAB — GLUCOSE, CAPILLARY
Glucose-Capillary: 107 mg/dL — ABNORMAL HIGH (ref 70–99)
Glucose-Capillary: 75 mg/dL (ref 70–99)

## 2024-02-27 SURGERY — CYSTOSCOPY/URETEROSCOPY/HOLMIUM LASER/STENT PLACEMENT
Anesthesia: General | Laterality: Left

## 2024-02-27 MED ORDER — OXYCODONE HCL 5 MG PO TABS
ORAL_TABLET | ORAL | Status: AC
  Filled 2024-02-27: qty 1

## 2024-02-27 MED ORDER — HYDRALAZINE HCL 20 MG/ML IJ SOLN
5.0000 mg | Freq: Once | INTRAMUSCULAR | Status: AC
  Administered 2024-02-27: 5 mg via INTRAVENOUS

## 2024-02-27 MED ORDER — FENTANYL CITRATE PF 50 MCG/ML IJ SOSY
PREFILLED_SYRINGE | INTRAMUSCULAR | Status: AC
  Filled 2024-02-27: qty 1

## 2024-02-27 MED ORDER — ONDANSETRON HCL 4 MG/2ML IJ SOLN: 4.0000 mg | Freq: Once | INTRAMUSCULAR | Status: DC | PRN

## 2024-02-27 MED ORDER — SODIUM CHLORIDE 0.9 % IV SOLN
2.0000 g | INTRAVENOUS | Status: AC
  Administered 2024-02-27: 2 g via INTRAVENOUS
  Filled 2024-02-27: qty 20

## 2024-02-27 MED ORDER — LIDOCAINE HCL (CARDIAC) PF 100 MG/5ML IV SOSY
PREFILLED_SYRINGE | INTRAVENOUS | Status: DC | PRN
  Administered 2024-02-27: 50 mg via INTRAVENOUS

## 2024-02-27 MED ORDER — DROPERIDOL 2.5 MG/ML IJ SOLN
INTRAMUSCULAR | Status: AC
Start: 2024-02-27 — End: 2024-02-27
  Filled 2024-02-27: qty 2

## 2024-02-27 MED ORDER — PROPOFOL 10 MG/ML IV BOLUS
INTRAVENOUS | Status: AC
  Filled 2024-02-27: qty 20

## 2024-02-27 MED ORDER — LACTATED RINGERS IV SOLN: INTRAVENOUS | Status: DC

## 2024-02-27 MED ORDER — ACETAMINOPHEN 10 MG/ML IV SOLN: 1000.0000 mg | Freq: Once | INTRAVENOUS | Status: DC | PRN

## 2024-02-27 MED ORDER — FENTANYL CITRATE PF 50 MCG/ML IJ SOSY
25.0000 ug | PREFILLED_SYRINGE | INTRAMUSCULAR | Status: DC | PRN
  Administered 2024-02-27: 50 ug via INTRAVENOUS

## 2024-02-27 MED ORDER — CHLORHEXIDINE GLUCONATE 0.12 % MT SOLN
15.0000 mL | Freq: Once | OROMUCOSAL | Status: AC
  Administered 2024-02-27: 15 mL via OROMUCOSAL

## 2024-02-27 MED ORDER — DEXAMETHASONE SODIUM PHOSPHATE 10 MG/ML IJ SOLN
INTRAMUSCULAR | Status: DC | PRN
  Administered 2024-02-27: 4 mg via INTRAVENOUS

## 2024-02-27 MED ORDER — PHENYLEPHRINE HCL (PRESSORS) 10 MG/ML IV SOLN
INTRAVENOUS | Status: DC | PRN
Start: 2024-02-27 — End: 2024-02-27
  Administered 2024-02-27 (×3): 80 ug via INTRAVENOUS

## 2024-02-27 MED ORDER — ORAL CARE MOUTH RINSE: 15.0000 mL | Freq: Once | OROMUCOSAL | Status: AC

## 2024-02-27 MED ORDER — OXYCODONE HCL 5 MG/5ML PO SOLN: 5.0000 mg | Freq: Once | ORAL | Status: AC | PRN

## 2024-02-27 MED ORDER — OXYCODONE HCL 5 MG PO TABS
5.0000 mg | ORAL_TABLET | Freq: Once | ORAL | Status: AC | PRN
  Administered 2024-02-27: 5 mg via ORAL

## 2024-02-27 MED ORDER — SODIUM CHLORIDE 0.9 % IR SOLN
Status: DC | PRN
  Administered 2024-02-27: 3000 mL

## 2024-02-27 MED ORDER — HYDRALAZINE HCL 20 MG/ML IJ SOLN
INTRAMUSCULAR | Status: AC
  Filled 2024-02-27: qty 1

## 2024-02-27 MED ORDER — PROPOFOL 10 MG/ML IV BOLUS
INTRAVENOUS | Status: DC | PRN
  Administered 2024-02-27: 100 mg via INTRAVENOUS

## 2024-02-27 MED ORDER — DROPERIDOL 2.5 MG/ML IJ SOLN
0.6250 mg | Freq: Once | INTRAMUSCULAR | Status: AC | PRN
  Administered 2024-02-27: 0.625 mg via INTRAVENOUS

## 2024-02-27 MED ORDER — IOHEXOL 300 MG/ML  SOLN
INTRAMUSCULAR | Status: DC | PRN
  Administered 2024-02-27: 5 mL

## 2024-02-27 MED ORDER — FENTANYL CITRATE (PF) 100 MCG/2ML IJ SOLN
INTRAMUSCULAR | Status: AC
  Filled 2024-02-27: qty 2

## 2024-02-27 MED ORDER — ONDANSETRON HCL 4 MG/2ML IJ SOLN
INTRAMUSCULAR | Status: DC | PRN
  Administered 2024-02-27: 4 mg via INTRAVENOUS

## 2024-02-27 MED ORDER — INSULIN ASPART 100 UNIT/ML IJ SOLN: 0.0000 [IU] | Freq: Three times a day (TID) | INTRAMUSCULAR | Status: DC

## 2024-02-27 MED ORDER — INSULIN ASPART 100 UNIT/ML IJ SOLN: 0.0000 [IU] | INTRAMUSCULAR | Status: DC | PRN

## 2024-02-27 MED ORDER — FENTANYL CITRATE (PF) 100 MCG/2ML IJ SOLN
INTRAMUSCULAR | Status: DC | PRN
  Administered 2024-02-27 (×2): 50 ug via INTRAVENOUS

## 2024-02-27 SURGICAL SUPPLY — 19 items
BAG URO CATCHER STRL LF (MISCELLANEOUS) ×2 IMPLANT
BASKET LASER NITINOL 1.9FR (BASKET) IMPLANT
BASKET ZERO TIP NITINOL 2.4FR (BASKET) IMPLANT
CATH URETERAL DUAL LUMEN 10F (MISCELLANEOUS) IMPLANT
CATH URETL OPEN END 6FR 70 (CATHETERS) ×2 IMPLANT
CLOTH BEACON ORANGE TIMEOUT ST (SAFETY) ×2 IMPLANT
GLOVE BIO SURGEON STRL SZ7.5 (GLOVE) ×2 IMPLANT
GOWN STRL REUS W/ TWL XL LVL3 (GOWN DISPOSABLE) ×2 IMPLANT
GUIDEWIRE ANG ZIPWIRE 038X150 (WIRE) IMPLANT
GUIDEWIRE STR DUAL SENSOR (WIRE) ×2 IMPLANT
KIT TURNOVER KIT A (KITS) ×2 IMPLANT
MANIFOLD NEPTUNE II (INSTRUMENTS) ×2 IMPLANT
PACK CYSTO (CUSTOM PROCEDURE TRAY) ×2 IMPLANT
SHEATH NAVIGATOR HD 11/13X28 (SHEATH) IMPLANT
SHEATH NAVIGATOR HD 11/13X36 (SHEATH) IMPLANT
STENT URET 6FRX24 CONTOUR (STENTS) IMPLANT
TRACTIP FLEXIVA PULS ID 200XHI (Laser) IMPLANT
TUBING CONNECTING 10 (TUBING) ×2 IMPLANT
TUBING UROLOGY SET (TUBING) ×2 IMPLANT

## 2024-02-27 NOTE — Discharge Instructions (Signed)

## 2024-02-27 NOTE — Anesthesia Procedure Notes (Signed)
 Procedure Name: LMA Insertion Date/Time: 02/27/2024 12:36 PM  Performed by: Levander Lani BIRCH, CRNAPre-anesthesia Checklist: Patient identified, Emergency Drugs available, Patient being monitored and Timeout performed Patient Re-evaluated:Patient Re-evaluated prior to induction Oxygen Delivery Method: Circle system utilized Preoxygenation: Pre-oxygenation with 100% oxygen Induction Type: IV induction Ventilation: Mask ventilation without difficulty LMA: LMA inserted LMA Size: 4.0 Number of attempts: 1 Placement Confirmation: positive ETCO2 and breath sounds checked- equal and bilateral Secured at: 22 cm Tube secured with: Tape Dental Injury: Teeth and Oropharynx as per pre-operative assessment

## 2024-02-27 NOTE — Anesthesia Postprocedure Evaluation (Signed)
 Anesthesia Post Note  Patient: Laketta L Schmiesing  Procedure(s) Performed: CYSTOSCOPY/RETROGRADE/URETEROSCOPY/HOLMIUM LASER/STENT PLACEMENT (Left)     Patient location during evaluation: PACU Anesthesia Type: General Level of consciousness: awake Pain management: pain level controlled Vital Signs Assessment: post-procedure vital signs reviewed and stable Respiratory status: spontaneous breathing Cardiovascular status: blood pressure returned to baseline Postop Assessment: no apparent nausea or vomiting Anesthetic complications: no   No notable events documented.  Last Vitals:  Vitals:   02/27/24 1445 02/27/24 1500  BP: (!) 162/65 (!) 157/72  Pulse: 80 80  Resp: 14   Temp:    SpO2: 97% 96%    Last Pain:  Vitals:   02/27/24 1404  TempSrc:   PainSc: 4                  Lauraine KATHEE [place]

## 2024-02-27 NOTE — Op Note (Signed)
 Operative Note  Preoperative diagnosis:  1.  Left ureteropelvic junction calculus-  Postoperative diagnosis: 1.  Same  Procedure(s): 1.  Cystoscopy with left retrograde pyelogram, left ureteroscopy with laser lithotripsy, ureteral stent exchange  Surgeon: Sherwood Edison, MD  Assistants: None  Anesthesia: General  Complications: None immediate  EBL: Minimal  Specimens: 1.  None  Drains/Catheters: 1.  6 x 24 double-J ureteral stent  Intraoperative findings: 1.  Normal urethra and bladder 2.  1 cm ureteropelvic junction calculus fragmented to tiny fragments on the settings  3.  Left retrograde pyelogram after treatment of the stone showed no filling defect and no hydronephrosis.  Indication: 63 year old female with a left renal pelvic calculus status post urgent ureteral stent placement presents for definitive management of her stone.  Description of procedure:  The patient was identified and consent was obtained.  The patient was taken to the operating room and placed in the supine position.  The patient was placed under general anesthesia.  Perioperative antibiotics were administered.  The patient was placed in dorsal lithotomy.  Patient was prepped and draped in a standard sterile fashion and a timeout was performed.  A 21 French rigid cystoscope was advanced into the urethra and into the bladder.  Complete cystoscopy performed with the findings noted above.  Left stent was grasped and pulled just beyond the urethral meatus.  A wire was advanced through the stent and up to the kidney under fluoroscopic guidance.  Stent was withdrawn.  11 x 13 ureteral access sheath was advanced over the wire under continuous fluoroscopic guidance to the proximal ureter.  Inner sheath was withdrawn.  A second wire was advanced through the sheath and into the kidney and the sheath was withdrawn.  One of the wires was secured to the drape as a safety wire and the other wire was used to advance an 11 x  13 access sheath over the wire under continuous fluoroscopic guidance to the proximal ureter.  Inner sheath and wire were withdrawn.  Digital ureteroscopy was performed and the stone was lasered and fragmented on the settings to tiny fragments.  All stone in the kidney was treated.  I shot a retrograde pyelogram through the scope with findings noted above.  I withdrew the scope along with the access sheath visualizing the ureter upon removal.  There were no ureteral calculi and no ureteral injury was identified.  I backloaded the wire onto rigid cystoscope and advanced that into the bladder followed by routine placement of a 6 x 24 double-J ureteral stent.  Fluoroscopy confirmed proximal placement and direct visualization confirmed a good coil within the bladder.  I drained the bladder withdrew the scope.  Patient tolerated the procedure well was stable postoperatively.  Plan: Follow-up in 1 week for stent removal

## 2024-02-27 NOTE — Transfer of Care (Signed)
 Immediate Anesthesia Transfer of Care Note  Patient: Anita [name]  Procedure(s) Performed: CYSTOSCOPY/RETROGRADE/URETEROSCOPY/HOLMIUM LASER/STENT PLACEMENT (Left)  Patient Location: PACU  Anesthesia Type:General  Level of Consciousness: awake, alert , and oriented  Airway & Oxygen Therapy: Patient Spontanous Breathing and Patient connected to face mask oxygen  Post-op Assessment: Report given to RN and Post -op Vital signs reviewed and stable  Post vital signs: Reviewed and stable  Last Vitals:  Vitals Value Taken Time  BP 189/81 02/27/24 13:31  Temp    Pulse 70 02/27/24 13:33  Resp 19 02/27/24 13:33  SpO2 100 % 02/27/24 13:33  Vitals shown include unfiled device data.  Last Pain:  Vitals:   02/27/24 1120  TempSrc: Oral  PainSc:          Complications: No notable events documented.

## 2024-02-27 NOTE — Interval H&P Note (Signed)
 History and Physical Interval Note:  02/27/2024 11:49 AM  Brock L Carelli  has presented today for surgery, with the diagnosis of LEFT URETERAL STONE.  The various methods of treatment have been discussed with the patient and family. After consideration of risks, benefits and other options for treatment, the patient has consented to  Procedure(s): CYSTOSCOPY/URETEROSCOPY/HOLMIUM LASER/STENT PLACEMENT (Left) as a surgical intervention.  The patient's history has been reviewed, patient examined, no change in status, stable for surgery.  I have reviewed the patient's chart and labs.  Questions were answered to the patient's satisfaction.     Sherwood JONETTA Edison, III

## 2024-02-28 ENCOUNTER — Encounter (HOSPITAL_COMMUNITY): Payer: Self-pay | Admitting: Urology

## 2024-03-28 ENCOUNTER — Ambulatory Visit (HOSPITAL_COMMUNITY)

## 2024-03-29 ENCOUNTER — Other Ambulatory Visit (HOSPITAL_COMMUNITY): Payer: Self-pay

## 2024-03-29 DIAGNOSIS — Z720 Tobacco use: Secondary | ICD-10-CM

## 2024-04-10 LAB — COLOGUARD: COLOGUARD: POSITIVE — AB

## 2024-04-17 ENCOUNTER — Encounter (INDEPENDENT_AMBULATORY_CARE_PROVIDER_SITE_OTHER): Payer: Self-pay | Admitting: *Deleted
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
## 2024-04-30 ENCOUNTER — Ambulatory Visit: Admitting: Student in an Organized Health Care Education/Training Program

## 2024-04-30 DIAGNOSIS — I480 Paroxysmal atrial fibrillation: Secondary | ICD-10-CM

## 2024-04-30 DIAGNOSIS — R0609 Other forms of dyspnea: Secondary | ICD-10-CM

## 2024-06-01 ENCOUNTER — Ambulatory Visit: Payer: Self-pay
